# Patient Record
Sex: Male | Born: 1950 | Race: White | Hispanic: No | Smoking: Never smoker
Health system: Southern US, Community
[De-identification: ages and names within clinical notes are randomized; demographics above are authoritative.]

## PROBLEM LIST (undated history)

## (undated) DIAGNOSIS — I2510 Atherosclerotic heart disease of native coronary artery without angina pectoris: Secondary | ICD-10-CM

## (undated) DIAGNOSIS — E785 Hyperlipidemia, unspecified: Secondary | ICD-10-CM

## (undated) DIAGNOSIS — I700 Atherosclerosis of aorta: Secondary | ICD-10-CM

## (undated) DIAGNOSIS — H269 Unspecified cataract: Secondary | ICD-10-CM

## (undated) DIAGNOSIS — I10 Essential (primary) hypertension: Secondary | ICD-10-CM

## (undated) DIAGNOSIS — D126 Benign neoplasm of colon, unspecified: Secondary | ICD-10-CM

## (undated) HISTORY — DX: Benign neoplasm of colon, unspecified: D12.6

## (undated) HISTORY — DX: Atherosclerosis of aorta: I70.0

## (undated) HISTORY — DX: Essential (primary) hypertension: I10

## (undated) HISTORY — DX: Hemochromatosis, unspecified: E83.119

## (undated) HISTORY — DX: Atherosclerotic heart disease of native coronary artery without angina pectoris: I25.10

## (undated) HISTORY — PX: INNER EAR SURGERY: SHX679

## (undated) HISTORY — DX: Hyperlipidemia, unspecified: E78.5

## (undated) HISTORY — DX: Unspecified cataract: H26.9

---

## 1999-08-27 ENCOUNTER — Encounter (INDEPENDENT_AMBULATORY_CARE_PROVIDER_SITE_OTHER): Payer: Self-pay | Admitting: Specialist

## 1999-08-27 ENCOUNTER — Ambulatory Visit (HOSPITAL_COMMUNITY): Admission: RE | Admit: 1999-08-27 | Discharge: 1999-08-27 | Payer: Self-pay | Admitting: Gastroenterology

## 1999-08-27 ENCOUNTER — Encounter: Payer: Self-pay | Admitting: Gastroenterology

## 2000-04-13 ENCOUNTER — Inpatient Hospital Stay (HOSPITAL_COMMUNITY): Admission: AD | Admit: 2000-04-13 | Discharge: 2000-04-16 | Payer: Self-pay | Admitting: Cardiology

## 2000-04-13 ENCOUNTER — Encounter: Payer: Self-pay | Admitting: Emergency Medicine

## 2000-12-16 ENCOUNTER — Inpatient Hospital Stay (HOSPITAL_COMMUNITY): Admission: EM | Admit: 2000-12-16 | Discharge: 2000-12-18 | Payer: Self-pay | Admitting: Emergency Medicine

## 2000-12-16 ENCOUNTER — Encounter: Payer: Self-pay | Admitting: Emergency Medicine

## 2000-12-17 ENCOUNTER — Encounter: Payer: Self-pay | Admitting: *Deleted

## 2002-06-19 ENCOUNTER — Emergency Department (HOSPITAL_COMMUNITY): Admission: EM | Admit: 2002-06-19 | Discharge: 2002-06-19 | Payer: Self-pay | Admitting: Emergency Medicine

## 2002-08-20 ENCOUNTER — Inpatient Hospital Stay (HOSPITAL_COMMUNITY): Admission: EM | Admit: 2002-08-20 | Discharge: 2002-08-22 | Payer: Self-pay | Admitting: Cardiology

## 2003-11-13 ENCOUNTER — Emergency Department (HOSPITAL_COMMUNITY): Admission: EM | Admit: 2003-11-13 | Discharge: 2003-11-13 | Payer: Self-pay | Admitting: Emergency Medicine

## 2004-04-09 ENCOUNTER — Ambulatory Visit: Payer: Self-pay | Admitting: Cardiology

## 2004-04-19 ENCOUNTER — Ambulatory Visit: Payer: Self-pay | Admitting: Internal Medicine

## 2004-04-28 ENCOUNTER — Ambulatory Visit: Payer: Self-pay | Admitting: Internal Medicine

## 2004-06-30 ENCOUNTER — Ambulatory Visit: Payer: Self-pay | Admitting: Internal Medicine

## 2004-07-21 ENCOUNTER — Ambulatory Visit: Payer: Self-pay | Admitting: Gastroenterology

## 2004-07-21 ENCOUNTER — Ambulatory Visit: Payer: Self-pay | Admitting: Internal Medicine

## 2004-08-23 ENCOUNTER — Ambulatory Visit: Payer: Self-pay | Admitting: Internal Medicine

## 2004-08-25 ENCOUNTER — Ambulatory Visit: Payer: Self-pay | Admitting: Internal Medicine

## 2004-12-27 ENCOUNTER — Ambulatory Visit: Payer: Self-pay | Admitting: Gastroenterology

## 2005-03-30 ENCOUNTER — Ambulatory Visit: Payer: Self-pay | Admitting: Gastroenterology

## 2005-06-29 ENCOUNTER — Ambulatory Visit: Payer: Self-pay | Admitting: Gastroenterology

## 2005-08-08 ENCOUNTER — Ambulatory Visit: Payer: Self-pay | Admitting: Internal Medicine

## 2005-08-10 ENCOUNTER — Ambulatory Visit: Payer: Self-pay | Admitting: Internal Medicine

## 2005-08-19 ENCOUNTER — Ambulatory Visit: Payer: Self-pay

## 2005-09-21 ENCOUNTER — Ambulatory Visit: Payer: Self-pay | Admitting: Internal Medicine

## 2005-10-19 ENCOUNTER — Ambulatory Visit: Payer: Self-pay | Admitting: Internal Medicine

## 2005-10-26 ENCOUNTER — Ambulatory Visit: Payer: Self-pay | Admitting: Internal Medicine

## 2006-01-25 ENCOUNTER — Ambulatory Visit: Payer: Self-pay | Admitting: Gastroenterology

## 2006-04-25 ENCOUNTER — Ambulatory Visit: Payer: Self-pay | Admitting: Gastroenterology

## 2006-04-25 LAB — CONVERTED CEMR LAB
HCT: 44.4 % (ref 39.0–52.0)
Hemoglobin: 15.2 g/dL (ref 13.0–17.0)

## 2006-06-21 ENCOUNTER — Ambulatory Visit: Payer: Self-pay | Admitting: Internal Medicine

## 2006-07-31 ENCOUNTER — Ambulatory Visit: Payer: Self-pay | Admitting: Internal Medicine

## 2006-07-31 LAB — CONVERTED CEMR LAB
ALT: 36 units/L (ref 0–40)
AST: 26 units/L (ref 0–37)
BUN: 12 mg/dL (ref 6–23)
Cholesterol: 142 mg/dL (ref 0–200)
Creatinine, Ser: 0.9 mg/dL (ref 0.4–1.5)
Glucose, Bld: 64 mg/dL — ABNORMAL LOW (ref 70–99)
HCT: 38.8 % — ABNORMAL LOW (ref 39.0–52.0)
HDL: 46.7 mg/dL (ref >39.0)
Hemoglobin: 13.9 g/dL (ref 13.0–17.0)
Hgb A1c MFr Bld: 8 % — ABNORMAL HIGH (ref 4.6–6.0)
LDL Cholesterol: 80 mg/dL (ref 0–99)
PSA: 0.78 ng/mL (ref 0.10–4.00)
Total CHOL/HDL Ratio: 3
Triglycerides: 75 mg/dL (ref 0–149)
VLDL: 15 mg/dL (ref 0–40)

## 2006-09-11 ENCOUNTER — Ambulatory Visit: Payer: Self-pay | Admitting: Internal Medicine

## 2006-11-01 ENCOUNTER — Ambulatory Visit: Payer: Self-pay | Admitting: Gastroenterology

## 2006-11-01 LAB — CONVERTED CEMR LAB
HCT: 34.9 % — ABNORMAL LOW (ref 39.0–52.0)
Hemoglobin: 12 g/dL — ABNORMAL LOW (ref 13.0–17.0)

## 2007-02-21 ENCOUNTER — Ambulatory Visit: Payer: Self-pay | Admitting: Gastroenterology

## 2007-02-21 LAB — CONVERTED CEMR LAB
HCT: 40.4 % (ref 39.0–52.0)
Hemoglobin: 13.6 g/dL (ref 13.0–17.0)

## 2007-04-27 ENCOUNTER — Ambulatory Visit: Payer: Self-pay | Admitting: Gastroenterology

## 2007-07-27 ENCOUNTER — Ambulatory Visit: Payer: Self-pay | Admitting: Gastroenterology

## 2007-08-02 ENCOUNTER — Telehealth: Payer: Self-pay | Admitting: Internal Medicine

## 2007-08-07 ENCOUNTER — Telehealth: Payer: Self-pay | Admitting: Internal Medicine

## 2007-08-14 ENCOUNTER — Ambulatory Visit: Payer: Self-pay | Admitting: Cardiology

## 2007-08-15 ENCOUNTER — Ambulatory Visit: Payer: Self-pay

## 2007-08-15 ENCOUNTER — Encounter: Payer: Self-pay | Admitting: Internal Medicine

## 2007-08-15 LAB — CONVERTED CEMR LAB
ALT: 33 units/L (ref 0–53)
AST: 31 units/L (ref 0–37)
Alkaline Phosphatase: 172 units/L — ABNORMAL HIGH (ref 39–117)
BUN: 13 mg/dL (ref 6–23)
Basophils Relative: 0.9 % (ref 0.0–1.0)
Bilirubin, Direct: 0.2 mg/dL (ref 0.0–0.3)
CO2: 33 meq/L — ABNORMAL HIGH (ref 19–32)
Calcium: 9.3 mg/dL (ref 8.4–10.5)
Chloride: 106 meq/L (ref 96–112)
Eosinophils Absolute: 0.2 10*3/uL (ref 0.0–0.6)
Eosinophils Relative: 2.9 % (ref 0.0–5.0)
GFR calc Af Amer: 112 mL/min
GFR calc non Af Amer: 93 mL/min
Glucose, Bld: 59 mg/dL — ABNORMAL LOW (ref 70–99)
HDL: 56.6 mg/dL (ref >39.0)
Monocytes Relative: 11.6 % — ABNORMAL HIGH (ref 3.0–11.0)
Neutro Abs: 2.7 10*3/uL (ref 1.4–7.7)
Platelets: 294 10*3/uL (ref 150–400)
RBC: 4.99 M/uL (ref 4.22–5.81)
Saturation Ratios: 14.9 % — ABNORMAL LOW (ref 20.0–50.0)
Total CHOL/HDL Ratio: 2.5
Triglycerides: 44 mg/dL (ref 0–149)
VLDL: 9 mg/dL (ref 0–40)
WBC: 5.5 10*3/uL (ref 4.5–10.5)

## 2007-09-06 ENCOUNTER — Ambulatory Visit: Payer: Self-pay | Admitting: Cardiology

## 2007-10-25 ENCOUNTER — Ambulatory Visit: Payer: Self-pay | Admitting: Internal Medicine

## 2007-10-25 LAB — CONVERTED CEMR LAB: Hemoglobin: 13.4 g/dL (ref 13.0–17.0)

## 2007-10-29 ENCOUNTER — Encounter: Payer: Self-pay | Admitting: Internal Medicine

## 2007-10-31 ENCOUNTER — Ambulatory Visit: Payer: Self-pay | Admitting: Internal Medicine

## 2007-11-01 DIAGNOSIS — E1065 Type 1 diabetes mellitus with hyperglycemia: Secondary | ICD-10-CM

## 2007-11-01 DIAGNOSIS — H9190 Unspecified hearing loss, unspecified ear: Secondary | ICD-10-CM | POA: Insufficient documentation

## 2007-11-01 DIAGNOSIS — E10319 Type 1 diabetes mellitus with unspecified diabetic retinopathy without macular edema: Secondary | ICD-10-CM

## 2007-11-01 DIAGNOSIS — E109 Type 1 diabetes mellitus without complications: Secondary | ICD-10-CM | POA: Insufficient documentation

## 2007-11-01 DIAGNOSIS — I2510 Atherosclerotic heart disease of native coronary artery without angina pectoris: Secondary | ICD-10-CM | POA: Insufficient documentation

## 2007-11-01 DIAGNOSIS — I10 Essential (primary) hypertension: Secondary | ICD-10-CM | POA: Insufficient documentation

## 2007-11-01 DIAGNOSIS — E1051 Type 1 diabetes mellitus with diabetic peripheral angiopathy without gangrene: Secondary | ICD-10-CM | POA: Insufficient documentation

## 2007-11-01 DIAGNOSIS — I70209 Unspecified atherosclerosis of native arteries of extremities, unspecified extremity: Secondary | ICD-10-CM

## 2007-11-01 HISTORY — DX: Atherosclerotic heart disease of native coronary artery without angina pectoris: I25.10

## 2008-01-23 ENCOUNTER — Ambulatory Visit: Payer: Self-pay | Admitting: Gastroenterology

## 2008-01-23 LAB — CONVERTED CEMR LAB
HCT: 38 % — ABNORMAL LOW (ref 39.0–52.0)
Hemoglobin: 13.4 g/dL (ref 13.0–17.0)

## 2008-04-23 ENCOUNTER — Ambulatory Visit: Payer: Self-pay | Admitting: Gastroenterology

## 2008-04-25 LAB — CONVERTED CEMR LAB: Hemoglobin: 13.4 g/dL (ref 13.0–17.0)

## 2008-07-24 ENCOUNTER — Ambulatory Visit: Payer: Self-pay | Admitting: Gastroenterology

## 2008-07-31 ENCOUNTER — Encounter (INDEPENDENT_AMBULATORY_CARE_PROVIDER_SITE_OTHER): Payer: Self-pay

## 2008-08-20 ENCOUNTER — Telehealth: Payer: Self-pay | Admitting: Internal Medicine

## 2008-09-19 ENCOUNTER — Ambulatory Visit: Payer: Self-pay | Admitting: Internal Medicine

## 2008-10-21 ENCOUNTER — Ambulatory Visit: Payer: Self-pay | Admitting: Gastroenterology

## 2008-11-06 ENCOUNTER — Ambulatory Visit: Payer: Self-pay | Admitting: Internal Medicine

## 2009-01-21 ENCOUNTER — Ambulatory Visit: Payer: Self-pay | Admitting: Gastroenterology

## 2009-01-22 LAB — CONVERTED CEMR LAB
HCT: 37.3 % — ABNORMAL LOW (ref 39.0–52.0)
Hemoglobin: 12.4 g/dL — ABNORMAL LOW (ref 13.0–17.0)

## 2009-04-07 ENCOUNTER — Encounter (INDEPENDENT_AMBULATORY_CARE_PROVIDER_SITE_OTHER): Payer: Self-pay | Admitting: *Deleted

## 2009-04-22 ENCOUNTER — Telehealth: Payer: Self-pay | Admitting: Internal Medicine

## 2009-04-22 ENCOUNTER — Ambulatory Visit: Payer: Self-pay | Admitting: Gastroenterology

## 2009-04-23 ENCOUNTER — Ambulatory Visit: Payer: Self-pay | Admitting: Internal Medicine

## 2009-04-23 ENCOUNTER — Ambulatory Visit: Payer: Self-pay | Admitting: Cardiology

## 2009-04-23 LAB — CONVERTED CEMR LAB
Basophils Absolute: 0 10*3/uL (ref 0.0–0.1)
Basophils Relative: 0 % (ref 0.0–3.0)
Chloride: 102 meq/L (ref 96–112)
Creatinine, Ser: 1 mg/dL (ref 0.4–1.5)
Eosinophils Relative: 7.9 % — ABNORMAL HIGH (ref 0.0–5.0)
Ferritin: 8 ng/mL — ABNORMAL LOW (ref 22.0–322.0)
HCT: 36.5 % — ABNORMAL LOW (ref 39.0–52.0)
Hemoglobin: 11.9 g/dL — ABNORMAL LOW (ref 13.0–17.0)
Lymphocytes Relative: 31.5 % (ref 12.0–46.0)
Lymphs Abs: 2.1 10*3/uL (ref 0.7–4.0)
Monocytes Relative: 10.2 % (ref 3.0–12.0)
Neutro Abs: 3.3 10*3/uL (ref 1.4–7.7)
RBC: 4.44 M/uL (ref 4.22–5.81)
RDW: 16.3 % — ABNORMAL HIGH (ref 11.5–14.6)
Saturation Ratios: 7.8 % — ABNORMAL LOW (ref 20.0–50.0)
Sodium: 140 meq/L (ref 135–145)
WBC: 6.6 10*3/uL (ref 4.5–10.5)

## 2009-04-27 ENCOUNTER — Ambulatory Visit: Payer: Self-pay | Admitting: Internal Medicine

## 2009-04-27 LAB — CONVERTED CEMR LAB: Blood Glucose, Fingerstick: 47

## 2009-05-05 ENCOUNTER — Encounter: Payer: Self-pay | Admitting: Nurse Practitioner

## 2009-05-05 ENCOUNTER — Ambulatory Visit: Payer: Self-pay | Admitting: Cardiovascular Disease

## 2009-05-05 DIAGNOSIS — E785 Hyperlipidemia, unspecified: Secondary | ICD-10-CM | POA: Insufficient documentation

## 2009-05-06 ENCOUNTER — Telehealth: Payer: Self-pay | Admitting: Internal Medicine

## 2009-05-11 ENCOUNTER — Encounter: Payer: Self-pay | Admitting: Cardiology

## 2009-06-09 ENCOUNTER — Ambulatory Visit: Payer: Self-pay | Admitting: Cardiology

## 2009-07-02 ENCOUNTER — Ambulatory Visit: Payer: Self-pay | Admitting: Internal Medicine

## 2009-07-24 ENCOUNTER — Ambulatory Visit: Payer: Self-pay | Admitting: Gastroenterology

## 2009-07-24 LAB — CONVERTED CEMR LAB
HCT: 39.8 % (ref 39.0–52.0)
Hemoglobin: 12.7 g/dL — ABNORMAL LOW (ref 13.0–17.0)

## 2009-09-18 ENCOUNTER — Ambulatory Visit: Payer: Self-pay | Admitting: Internal Medicine

## 2009-09-18 ENCOUNTER — Telehealth: Payer: Self-pay | Admitting: Internal Medicine

## 2009-09-29 ENCOUNTER — Ambulatory Visit: Payer: Self-pay | Admitting: Internal Medicine

## 2009-09-29 LAB — CONVERTED CEMR LAB
ALT: 38 units/L (ref 0–53)
AST: 40 units/L — ABNORMAL HIGH (ref 0–37)
Albumin: 4.1 g/dL (ref 3.5–5.2)
Alkaline Phosphatase: 152 units/L — ABNORMAL HIGH (ref 39–117)
BUN: 10 mg/dL (ref 6–23)
CO2: 31 meq/L (ref 19–32)
Calcium: 9.3 mg/dL (ref 8.4–10.5)
Chloride: 103 meq/L (ref 96–112)
Creatinine, Ser: 1 mg/dL (ref 0.4–1.5)
Total Bilirubin: 0.8 mg/dL (ref 0.3–1.2)
Total CHOL/HDL Ratio: 3
Triglycerides: 91 mg/dL (ref 0.0–149.0)

## 2009-10-12 ENCOUNTER — Ambulatory Visit: Payer: Self-pay | Admitting: Internal Medicine

## 2009-10-12 DIAGNOSIS — R55 Syncope and collapse: Secondary | ICD-10-CM | POA: Insufficient documentation

## 2009-10-15 ENCOUNTER — Ambulatory Visit: Payer: Self-pay

## 2009-10-15 ENCOUNTER — Encounter: Payer: Self-pay | Admitting: Cardiology

## 2009-10-22 ENCOUNTER — Telehealth: Payer: Self-pay | Admitting: Gastroenterology

## 2009-10-22 ENCOUNTER — Telehealth: Payer: Self-pay | Admitting: Internal Medicine

## 2009-10-22 ENCOUNTER — Ambulatory Visit: Payer: Self-pay | Admitting: Gastroenterology

## 2009-10-26 LAB — CONVERTED CEMR LAB
Saturation Ratios: 10.5 % — ABNORMAL LOW (ref 20.0–50.0)
Transferrin: 257.8 mg/dL (ref 212.0–360.0)

## 2009-11-26 ENCOUNTER — Telehealth: Payer: Self-pay | Admitting: Internal Medicine

## 2009-12-01 ENCOUNTER — Telehealth: Payer: Self-pay | Admitting: Cardiology

## 2009-12-18 ENCOUNTER — Ambulatory Visit: Payer: Self-pay | Admitting: Gastroenterology

## 2009-12-21 LAB — CONVERTED CEMR LAB
Basophils Absolute: 0 10*3/uL (ref 0.0–0.1)
Folate: 11.9 ng/mL
Iron: 45 ug/dL (ref 42–165)
Lymphocytes Relative: 27.7 % (ref 12.0–46.0)
Lymphs Abs: 1.8 10*3/uL (ref 0.7–4.0)
Monocytes Relative: 10.3 % (ref 3.0–12.0)
Neutrophils Relative %: 55.9 % (ref 43.0–77.0)
Platelets: 247 10*3/uL (ref 150.0–400.0)
RDW: 20.6 % — ABNORMAL HIGH (ref 11.5–14.6)
Saturation Ratios: 15.8 % — ABNORMAL LOW (ref 20.0–50.0)
Transferrin: 203.8 mg/dL — ABNORMAL LOW (ref 212.0–360.0)
Vitamin B-12: 460 pg/mL (ref 211–911)

## 2009-12-24 ENCOUNTER — Ambulatory Visit: Payer: Self-pay | Admitting: Cardiology

## 2010-01-11 ENCOUNTER — Telehealth (INDEPENDENT_AMBULATORY_CARE_PROVIDER_SITE_OTHER): Payer: Self-pay | Admitting: *Deleted

## 2010-03-11 ENCOUNTER — Ambulatory Visit: Payer: Self-pay | Admitting: Internal Medicine

## 2010-06-11 ENCOUNTER — Ambulatory Visit
Admission: RE | Admit: 2010-06-11 | Discharge: 2010-06-11 | Payer: Self-pay | Source: Home / Self Care | Attending: Gastroenterology | Admitting: Gastroenterology

## 2010-06-11 ENCOUNTER — Other Ambulatory Visit: Payer: Self-pay | Admitting: Gastroenterology

## 2010-06-11 LAB — CBC WITH DIFFERENTIAL/PLATELET
Basophils Absolute: 0.1 10*3/uL (ref 0.0–0.1)
Basophils Relative: 0.8 % (ref 0.0–3.0)
Eosinophils Absolute: 0.3 10*3/uL (ref 0.0–0.7)
Eosinophils Relative: 4.1 % (ref 0.0–5.0)
HCT: 38.7 % — ABNORMAL LOW (ref 39.0–52.0)
Hemoglobin: 13.1 g/dL (ref 13.0–17.0)
Lymphocytes Relative: 32 % (ref 12.0–46.0)
Lymphs Abs: 2.3 10*3/uL (ref 0.7–4.0)
MCHC: 33.8 g/dL (ref 30.0–36.0)
MCV: 84.8 fl (ref 78.0–100.0)
Monocytes Absolute: 0.7 10*3/uL (ref 0.1–1.0)
Monocytes Relative: 9.2 % (ref 3.0–12.0)
Neutro Abs: 3.9 10*3/uL (ref 1.4–7.7)
Neutrophils Relative %: 53.9 % (ref 43.0–77.0)
Platelets: 231 10*3/uL (ref 150.0–400.0)
RBC: 4.56 Mil/uL (ref 4.22–5.81)
RDW: 16 % — ABNORMAL HIGH (ref 11.5–14.6)
WBC: 7.3 10*3/uL (ref 4.5–10.5)

## 2010-06-11 LAB — FERRITIN: Ferritin: 6.7 ng/mL — ABNORMAL LOW (ref 22.0–322.0)

## 2010-07-06 NOTE — Assessment & Plan Note (Signed)
Summary: PASSED OUT SAT/ EMT'S CAME/ REFUSED TO GO TO ER FOR EMT'S/NWS   Vital Signs:  Patient profile:   60 year old male Height:      74 inches Weight:      179 pounds BMI:     23.07 O2 Sat:      97 % on Room air Temp:     97.3 degrees F oral Pulse rate:   60 / minute BP sitting:   162 / 84  (left arm) Cuff size:   regular  Vitals Entered By: Bill Salinas CMA (Oct 12, 2009 9:14 AM)  O2 Flow:  Room air CC: pt here after having fainting spell on sat./ pt states his BP has been going up and down and does have a record of his readings/ pt states he was given [place]   Primary Care Provider:  [facility]  CC:  pt here after having fainting spell on sat./ pt states his BP has been going up and down and does have a record of his readings/ pt states he was given [place].  History of Present Illness: Patient reports an episode of syncope two days ago.  After a busy day he was at home with his son.  He got up from the sofa, walked 76ft and suddenly passed out.  He denies any abnormal sensations before passing out or after waking up on the ground.  His son witnessed the event and reported that he was out for only a few seconds and had no jerking movements.  The patient was not injured in this fall but expresses concerns that he might hit his head or injure himself in the future.  He denies hypoglycemia  on the day of the episode.  EMS was called but the patient refused to be transported to the hospital.  Per patient report, vital signs and EKG done by the EMS personnel were normal.  He has had episodes like this in the past.  Approximately one month ago he had three syncopal episodes over the span of a week.  He denies any consistent precipitating events.  He also reports separate incidents of racing heart rate.  Current Medications (verified): 1)  Enalapril Maleate 20 Mg  Tabs (Enalapril Maleate) .... Take 1 Tablet By Mouth Once A Day 2)  Simvastatin 20 Mg  Tabs (Simvastatin) .Marland Kitchen.. 1 Once Daily 3)   Levoxyl 75 Mcg  Tabs (Levothyroxine Sodium) .Marland Kitchen.. 1 Once Daily 4)  Humulin 70/30 70-30 %  Susp (Insulin Isophane & Regular) .... Use 32 Units Daily  As Directed 5)  Lantus 100 Unit/ml  Soln (Insulin Glargine) .... Use 12 Units Daily At Bedtime 6)  Bd Disp Needles 30g X 1/2"  Misc (Needle (Disp)) .... Use As Directed 7)  Aspirin 81 Mg  Tabs (Aspirin) .... Take 1 Tablet By Mouth Two Times A Day 8)  Free Style Lite Test Strips .... Cbgs Three Times A Day and Prn 9)  Cartia Xt 300 Mg Xr24h-Cap (Diltiazem Hcl Coated Beads) .Marland Kitchen.. 1 By Mouth Once Daily 10)  Metoprolol Succinate 50 Mg Xr24h-Tab (Metoprolol Succinate) .... Take 1 1/2 Tablet By Mouth Daily 11)  Clonidine Hcl 0.1 Mg Tabs (Clonidine Hcl) .Marland Kitchen.. 1 Tab If Bp Is 180 or Over  Allergies (verified): No Known Drug Allergies  Past History:  Past Medical History: Last updated: 05/05/2009 UCD Diabetes mellitus, type I Hypertension, Dx Fall 2010 Diabetic retinopathy Hearing loss CAD      - s/p MI Nov '01 PTCA  LAD      -  last cath March '04 60% LAD, 1st Dx 70% ostial, 2nd Dx 70% ostial            dominant RCA w/ 30% multiple lesions, Cx 30% ostial      - Myoview March '09 -  no ischemia Hearing loss Hemachromatosis Hyperlipidemia  Past Surgical History: Last updated: 10/31/2007 right ear surgery  Family History: Last updated: 09/19/2008 father - 1923: IDDM since '53, CVA - has a mild hemiplegia, EtOH abuse, prostate cancer. 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  The patient denies anorexia, fever,  weight loss, weight gain, vision loss, chest pain, dyspnea on exertion, peripheral edema, headaches, and muscle weakness.    Physical Exam  General:  alert and well-developed.   Head:  normocephalic and atraumatic.   Eyes:  vision grossly intact, pupils equal, pupils round, pupils reactive to light, and pupils react to accomodation.   Ears:  R ear normal and L ear normal.   Neck:  supple, no masses, no thyromegaly, and no thyroid nodules or tenderness.   Lungs:  normal respiratory effort, no accessory muscle use, normal breath sounds, no crackles, and no wheezes.   Heart:  normal rate, regular rhythm, no gallop, no rub, and Grade  1 /6 systolic ejection murmur.   Pulses:  no carotid bruits. Psych:  Oriented X3, memory intact for recent and remote, and normally interactive.   Additional Exam:  Orthostatics: Lying 160/80 Sitting 159/76 Standing 152/71   Impression & Recommendations:  Problem # 1:  SYNCOPE (ICD-780.2)  The patient reports several episodes of syncope.  The description of the episodes is consistent with a tachyarrhythmia such as PSVT.  The patient is already on beta-blocker and calcium channel blocker therapy.   Plan- Will refer patient for an event monitor to hopefully capture a reading during an event.    Orders: Cardiology Referral (Cardiology)  Problem # 2:  HYPERTENSION (ICD-401.9) Patient's blood pressure is still not at goal.  Home readings range 145-160/80-95.  Will do a trial of increased dose of Valturna.  Patient is to check blood pressure at home and call after a week with readings.   His updated medication list for this problem includes:    Valturna 300-320 Mg Tabs (Aliskiren-valsartan) .Marland Kitchen... 1 by mouth once daily    Cartia Xt 300 Mg Xr24h-cap (Diltiazem hcl coated beads) .Marland Kitchen... 1 by mouth once daily    Metoprolol Succinate 50 Mg Xr24h-tab (Metoprolol succinate) .Marland Kitchen... Take 1 1/2 tablet by mouth daily    Clonidine Hcl 0.1 Mg Tabs (Clonidine hcl) .Marland Kitchen... 1 tab  if bp is 180 or over  Problem # 3:  DIABETES MELLITUS, TYPE I (ICD-250.01) Patient is on a stable insulin plan.     His updated medication list for this problem includes:    Humulin 70/30 70-30 % Susp (Insulin isophane & regular) ..... Use 32 units daily  as directed    Lantus 100 Unit/ml Soln (Insulin glargine) ..... Use 12 units daily at bedtime    Aspirin 81 Mg Tabs (Aspirin) .Marland Kitchen... Take 1 tablet by mouth two times a  day  Complete Medication List: 1)  Valturna 300-320 Mg Tabs (Aliskiren-valsartan) .Marland Kitchen.. 1 by mouth once daily 2)  Simvastatin 20 Mg Tabs (Simvastatin) .Marland Kitchen.. 1 once daily 3)  Levoxyl 75 Mcg Tabs (Levothyroxine sodium) .Marland Kitchen.. 1 once daily 4)  Humulin 70/30 70-30 % Susp (Insulin isophane & regular) .... Use 32 units daily  as directed 5)  Lantus 100 Unit/ml Soln (Insulin glargine) .... Use 12 units daily at bedtime 6)  Bd Disp Needles 30g X 1/2" Misc (Needle (disp)) .... Use as directed 7)  Aspirin 81 Mg Tabs (Aspirin) .... Take 1 tablet by mouth two times a day 8)  Free Style Lite Test Strips  .... Cbgs three times a day and prn 9)  Cartia Xt 300 Mg Xr24h-cap (Diltiazem hcl coated beads) .Marland Kitchen.. 1 by mouth once daily 10)  Metoprolol Succinate 50 Mg Xr24h-tab (Metoprolol succinate) .... Take 1 1/2 tablet by mouth daily 11)  Clonidine Hcl 0.1 Mg Tabs (Clonidine hcl) .Marland Kitchen.. 1 tab if bp is 180 or over Prescriptions: HUMULIN 70/30 70-30 %  SUSP (INSULIN ISOPHANE & REGULAR) use 32 units daily  as directed  #9 bottles x 3   Entered and Authorized by:   Jacques Navy MD   Signed by:   Jacques Navy MD on 10/12/2009   Method used:   Electronically to        [facility]* (mail-order)             ,          Ph: [phone]       Fax: [phone]   RxID:   [id number]

## 2010-07-06 NOTE — Progress Notes (Signed)
Summary: Monitor- f/u on symptoms   Phone Note Outgoing Call   Call placed by: Sherri Rad, RN, BSN,  November 26, 2009 2:46 PM Call placed to: Patient Summary of Call: I left a message for the pt to call regarding his event monitor. I reviewed the readings with Dr. Bonney Aid. Pt is having sinus brady and some junctional rhythms. Dr. Johney Frame asked that I call the pt to f/u on his symptoms. Initial call taken by: Sherri Rad, RN, BSN,  November 26, 2009 2:47 PM  Follow-up for Phone Call        pt reutrned call to heather pls call [phone] Follow-up by: Glynda Jaeger,  November 26, 2009 4:28 PM  Additional Follow-up for Phone Call Additional follow up Details #1::        I called the pt to f/u on his symptoms. He states that he does occasionally pass out, but he feels this is related to his bp meds. He states on days he does not take his meds/ he leaves a med off, that he feels ok. He states he felt bad yesterday afternoon and his bp was 119/60. The last time he passed out was last wednsday. He states he only passes out after he changes position from sitting to standing. I have verified his cardiac medications. I will review with the DOD and call the pt back. He is agreeable.  Additional Follow-up by: Sherri Rad, RN, BSN,  November 27, 2009 9:26 AM    Additional Follow-up for Phone Call Additional follow up Details #2::    I reviewed the pt's symptoms, medications, and rythym strips with Dr. Tenny Craw. Per Dr. Tenny Craw- orders received to have the pt d/c diltiazem and start amlodipine 10mg  once daily. I have explained this to the pt. He verbalizes understanding. I will review the above with Dr. Juanda Chance when he returns to the office next week to see if he wants to see the pt back in the office. I will call the pt back next week to verify this with him. He is agreeable. [facility] [address].  Follow-up by: Sherri Rad, RN, BSN,  November 27, 2009 10:42 AM  New/Updated Medications: AMLODIPINE  BESYLATE 10 MG TABS (AMLODIPINE BESYLATE) Take one tablet by mouth daily CLONIDINE HCL 0.1 MG TABS (CLONIDINE HCL) 1 tab as needed  if BP is 180 or over Prescriptions: AMLODIPINE BESYLATE 10 MG TABS (AMLODIPINE BESYLATE) Take one tablet by mouth daily  #30 x 6   Entered by:   Sherri Rad, RN, BSN   Authorized by:   Lenoria Farrier, MD, [facility]   Signed by:   Sherri Rad, RN, BSN on 11/27/2009   Method used:   [facility]        [facility] [phone]* (retail)       [address]       [place], [place]  [address]       Ph: [phone] or [phone]       Fax: [phone]   RxID:   [id number]

## 2010-07-06 NOTE — Assessment & Plan Note (Signed)
Summary: BP ISSUES FOR 6 MO/ NEEDS RX'S RE-WRITTEN DUE TO INSU CHANGE/NWS   Vital Signs:  Patient profile:   60 year old male Height:      74 inches Weight:      178 pounds BMI:     22.94 O2 Sat:      97 % on Room air Temp:     96.7 degrees F oral BP sitting:   158 / 70  (left arm) Cuff size:   regular  Vitals Entered By: Bill Salinas CMA (March 11, 2010 2:21 PM)  O2 Flow:  Room air CC: ov for evaluation of continued high BP/ ab   Primary Care Sinia Antosh:  [facility]  CC:  ov for evaluation of continued high BP/ ab.  History of Present Illness: Logan [name] is a 60 y.o. caucasian male who came to the clinic today because he recently switched insurance companies and needs new perscriptions for all of his medication. His new insurance company, [employer], is intended for high risk patients (Logan [name] has a PMH positive for Type I diabetes, Acute MI, and HTN). He receives his medication in the mail. He has recently run out of Amlodipine medication and needs that filled ASAP becuase he cannot wait for it to arrive via mail.  Current Medications (verified): 1)  Valturna 300-320 Mg Tabs (Aliskiren-Valsartan) .Marland Kitchen.. 1 By Mouth Once Daily 2)  Simvastatin 20 Mg  Tabs (Simvastatin) .Marland Kitchen.. 1 Once Daily 3)  Levoxyl 75 Mcg  Tabs (Levothyroxine Sodium) .Marland Kitchen.. 1 Once Daily 4)  Humulin 70/30 70-30 %  Susp (Insulin Isophane & Regular) .... Use 32 Units Daily  As Directed 5)  Lantus 100 Unit/ml  Soln (Insulin Glargine) .... Use 12 Units Daily At Bedtime 6)  Bd Disp Needles 30g X 1/2"  Misc (Needle (Disp)) .... Use As Directed 7)  Aspirin 81 Mg  Tabs (Aspirin) .... Take 1 Tablet By Mouth Two Times A Day 8)  Free Style Lite Test Strips .... Cbgs Three Times A Day and Prn 9)  Amlodipine Besylate 10 Mg Tabs (Amlodipine Besylate) .... Take One Tablet By Mouth Daily 10)  Metoprolol Succinate 50 Mg Xr24h-Tab (Metoprolol Succinate) .... Take 1 1/2 Tablet By Mouth Daily 11)  Clonidine Hcl 0.1 Mg Tabs (Clonidine  Hcl) .Marland Kitchen.. 1 Tab As Needed  If Bp Is 180 or Over  Allergies (verified): No Known Drug Allergies  Past History:  Past Medical History: Last updated: 05/05/2009 UCD Diabetes mellitus, type I Hypertension, Dx Fall 2010 Diabetic retinopathy Hearing loss CAD      - s/p MI Nov '01 PTCA  LAD      - last cath March '04 60% LAD, 1st Dx 70% ostial, 2nd Dx 70% ostial            dominant RCA w/ 30% multiple lesions, Cx 30% ostial      - Myoview March '09 -  no ischemia Hearing loss Hemachromatosis Hyperlipidemia  Past Surgical History: Last updated: 10/31/2007 right ear surgery  Family History: Last updated: 09/19/2008 father - 1923: IDDM since '53, CVA - has a mild hemiplegia, EtOH abuse, prostate cancer. mother - deceased @37 : asthma, emphysema, heart failure Neg - colon cancer; CAD   Impression & Recommendations:  Problem # 1:  Preventive Health Care (ICD-V70.0) Patient reports he is doing well. All his meds are refilled. He will follow up for routine care and management. He will follow-up with cardiology as instructed.  Complete Medication List: 1)  Valturna 300-320 Mg Tabs (Aliskiren-valsartan) .Marland KitchenMarland KitchenMarland Kitchen  1 by mouth once daily 2)  Simvastatin 20 Mg Tabs (Simvastatin) .Marland Kitchen.. 1 once daily 3)  Levoxyl 75 Mcg Tabs (Levothyroxine sodium) .Marland Kitchen.. 1 once daily 4)  Humulin 70/30 70-30 % Susp (Insulin isophane & regular) .... Use 32 units daily  as directed 5)  Lantus 100 Unit/ml Soln (Insulin glargine) .... Use 12 units daily at bedtime 6)  Bd Disp Needles 30g X 1/2" Misc (Needle (disp)) .... Use as directed 7)  Aspirin 81 Mg Tabs (Aspirin) .... Take 1 tablet by mouth two times a day 8)  Free Style Lite Test Strips  .... Cbgs three times a day and prn 9)  Amlodipine Besylate 10 Mg Tabs (Amlodipine besylate) .... Take one tablet by mouth daily 10)  Metoprolol Succinate 50 Mg Xr24h-tab (Metoprolol succinate) .... Take 1 1/2 tablet by mouth daily 11)  Clonidine Hcl 0.1 Mg Tabs (Clonidine hcl)  .Marland Kitchen.. 1 tab as needed  if bp is 180 or over  Other Orders: Admin 1st Vaccine ([address]) Flu Vaccine 73yrs + ([address]) Prescriptions: AMLODIPINE BESYLATE 10 MG TABS (AMLODIPINE BESYLATE) Take one tablet by mouth daily  #30 x 0   Entered and Authorized by:   Logan Navy MD   Signed by:   Logan Navy MD on 03/11/2010   Method used:   Print then Give to Patient   RxID:   [id number] CLONIDINE HCL 0.1 MG TABS (CLONIDINE HCL) 1 tab as needed  if BP is 180 or over  #90 x 3   Entered and Authorized by:   Logan Navy MD   Signed by:   Logan Navy MD on 03/11/2010   Method used:   Print then Give to Patient   RxID:   [id number] METOPROLOL SUCCINATE 50 MG XR24H-TAB (METOPROLOL SUCCINATE) Take 1 1/2 tablet by mouth daily  #135 x 3   Entered and Authorized by:   Logan Navy MD   Signed by:   Logan Navy MD on 03/11/2010   Method used:   Print then Give to Patient   RxID:   [id number] AMLODIPINE BESYLATE 10 MG TABS (AMLODIPINE BESYLATE) Take one tablet by mouth daily  #90 x 3   Entered and Authorized by:   Logan Navy MD   Signed by:   Logan Navy MD on 03/11/2010   Method used:   Print then Give to Patient   RxID:   [id number] BD DISP NEEDLES 30G X 1/2"  MISC (NEEDLE (DISP)) use as directed  #300 x 2   Entered and Authorized by:   Logan Navy MD   Signed by:   Logan Navy MD on 03/11/2010   Method used:   Print then Give to Patient   RxID:   [id number] HUMULIN 70/30 70-30 %  SUSP (INSULIN ISOPHANE & REGULAR) use 32 units daily  as directed  #9 bottles x 3   Entered and Authorized by:   Logan Navy MD   Signed by:   Logan Navy MD on 03/11/2010   Method used:   Print then Give to Patient   RxID:   [id number] LEVOXYL 75 MCG  TABS (LEVOTHYROXINE SODIUM) 1 once daily  #90 x 3   Entered and Authorized by:   Logan Navy MD   Signed by:   Logan Navy MD on 03/11/2010   Method used:   Print then Give  to Patient   RxID:   [id number] SIMVASTATIN 20 MG  TABS (SIMVASTATIN) 1  once daily  #90 x 3   Entered and Authorized by:   Logan Navy MD   Signed by:   Logan Navy MD on 03/11/2010   Method used:   Print then Give to Patient   RxID:   [id number] VALTURNA 300-320 MG TABS (ALISKIREN-VALSARTAN) 1 by mouth once daily  #90 x 3   Entered and Authorized by:   Logan Navy MD   Signed by:   Logan Navy MD on 03/11/2010   Method used:   Print then Give to Patient   RxID:   [id number] AMLODIPINE BESYLATE 10 MG TABS (AMLODIPINE BESYLATE) Take one tablet by mouth daily  #90 x 3   Entered and Authorized by:   Logan Navy MD   Signed by:   Logan Navy MD on 03/11/2010   Method used:   Print then Give to Patient   RxID:   [id number] AMLODIPINE BESYLATE 10 MG TABS (AMLODIPINE BESYLATE) Take one tablet by mouth daily  #30 x 1   Entered and Authorized by:   Logan Navy MD   Signed by:   Logan Navy MD on 03/11/2010   Method used:   Electronically to        [id number]* (retail)       [address] [place], [place]  [address]       Ph: [phone]       Fax: [phone]   RxID:   [id number] CLONIDINE HCL 0.1 MG TABS (CLONIDINE HCL) 1 tab as needed  if BP is 180 or over  #90 x 3   Entered and Authorized by:   Logan Navy MD   Signed by:   Logan Navy MD on 03/11/2010   Method used:   Electronically to        [facility] #[id number]* (retail)       [address] [place], [place]  [address]       Ph: [phone]       Fax: [phone]   RxID:   [id number] METOPROLOL SUCCINATE 50 MG XR24H-TAB (METOPROLOL SUCCINATE) Take 1 1/2 tablet by mouth daily  #135 x 3   Entered and Authorized by:   Logan Navy MD   Signed by:   Logan Navy MD on 03/11/2010   Method used:   Electronically to        [facility] #[id number]* (retail)       [address] [place], [place]  [address]       Ph: [phone]       Fax: [phone]   RxID:   [id number] FREE STYLE LITE TEST STRIPS CBGs three times a day and prn  #270 x 3   Entered and Authorized by:   Logan Navy MD   Signed by:   Logan Navy MD on 03/11/2010   Method used:   Print then Give to Patient   RxID:   [id number] LANTUS 100 UNIT/ML  SOLN (INSULIN GLARGINE) use 12 units daily at bedtime  #0 x 0   Entered and Authorized by:   Logan Navy MD   Signed by:   Logan Navy MD on 03/11/2010   Method used:   Print then Give to Patient   RxID:   [id number] HUMULIN 70/30 70-30 %  SUSP (  INSULIN ISOPHANE & REGULAR) use 32 units daily  as directed  #9 bottles x 3   Entered and Authorized by:   Logan Navy MD   Signed by:   Logan Navy MD on 03/11/2010   Method used:   Electronically to        [facility] #[id number]* (retail)       [address] [place], [place]  [address]       Ph: [phone]       Fax: [phone]   RxID:   [id number] LEVOXYL 75 MCG  TABS (LEVOTHYROXINE SODIUM) 1 once daily  #90 x 3   Entered and Authorized by:   Logan Navy MD   Signed by:   Logan Navy MD on 03/11/2010   Method used:   Electronically to        [facility] #[id number]* (retail)       [address] [place], [place]  [address]       Ph: [phone]       Fax: [phone]   RxID:   [id number] SIMVASTATIN 20 MG  TABS (SIMVASTATIN) 1 once daily  #90 x 3   Entered and Authorized by:   Logan Navy MD   Signed by:   Logan Navy MD on 03/11/2010   Method used:   Electronically to        [facility][id number]* (retail)       [address] [place], [place]  [address]       Ph: [phone]       Fax: [phone]   RxID:   [id number] VALTURNA 300-320 MG TABS (ALISKIREN-VALSARTAN) 1 by mouth once daily  #90 x 3   Entered and Authorized by:   Logan Navy MD   Signed by:   Logan Navy MD on  03/11/2010   Method used:   Electronically to        [facility] #[id number]* (retail)       [address] [place], [place]  [address]       Ph: [phone]       Fax: [phone]   RxID:   [id number]      Flu Vaccine Consent Questions     Do you have a history of severe allergic reactions to this vaccine? no    Any prior history of allergic reactions to egg and/or gelatin? no    Do you have a sensitivity to the preservative Thimersol? no    Do you have a past history of Guillan-Barre Syndrome? no    Do you currently have an acute febrile illness? no    Have you ever had a severe reaction to latex? no    Vaccine information given and explained to patient? yes    Are you currently pregnant? no    Lot Number:[id number]   Exp Date:12/04/2010   Site Given  Left Deltoid IMflu

## 2010-07-06 NOTE — Procedures (Signed)
Summary: Summary Report  Summary Report   Imported By: Erle Crocker 12/31/2009 07:43:04  _____________________________________________________________________  External Attachment:    Type:   Image     Comment:   External Document

## 2010-07-06 NOTE — Assessment & Plan Note (Signed)
Summary: rov   Visit Type:  Follow-up Primary Provider:  Norins  CC:  pt here to review monitor results and discuss bp readings.  History of Present Illness: The patient is 60 years old and return for followup management of CAD. 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 He had nonobstructive disease at catheterization in 2004. He had a negative Myoview scan in 2009 with an ejection fraction of 49%.  he recently was seen by Dr. Army Melia with syncopal episodes. 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 His diltiazem was discontinued after the monitor and he has had no recurrent symptoms.  He's had no recent chest pain.  Current Medications (verified): 1)  Valturna 300-320 Mg Tabs (Aliskiren-Valsartan) .Marland Kitchen.. 1 By Mouth Once Daily 2)  Simvastatin 20 Mg  Tabs (Simvastatin) .Marland Kitchen.. 1 Once Daily 3)  Levoxyl 75 Mcg  Tabs (Levothyroxine Sodium) .Marland Kitchen.. 1 Once Daily 4)  Humulin 70/30 70-30 %  Susp (Insulin Isophane & Regular) .... Use 32 Units Daily  As Directed 5)  Lantus 100 Unit/ml  Soln (Insulin Glargine) .... Use 12 Units Daily At Bedtime 6)  Bd Disp Needles 30g X 1/2"  Misc (Needle (Disp)) .... Use As Directed 7)  Aspirin 81 Mg  Tabs (Aspirin) .... Take 1 Tablet By Mouth Two Times A Day 8)  Free Style Lite Test Strips .... Cbgs Three Times A Day and Prn 9)  Amlodipine Besylate 10 Mg Tabs (Amlodipine Besylate) .... Take One Tablet By Mouth Daily 10)  Metoprolol Succinate 50 Mg Xr24h-Tab (Metoprolol Succinate) .... Take 1 1/2 Tablet By Mouth Daily 11)  Clonidine Hcl 0.1 Mg Tabs (Clonidine Hcl) .Marland Kitchen.. 1 Tab As Needed  If Bp Is 180 or Over  Allergies (verified): No Known Drug Allergies  Past History:  Past Medical History: Reviewed history from 05/05/2009 and no changes required. UCD Diabetes mellitus, type I Hypertension, Dx Fall 2010 Diabetic retinopathy Hearing loss CAD      - s/p MI Nov '01 PTCA  LAD      - last cath  March '04 60% LAD, 1st Dx 70% ostial, 2nd Dx 70% ostial            dominant RCA w/ 30% multiple lesions, Cx 30% ostial      - Myoview March '09 -  no ischemia Hearing loss Hemachromatosis Hyperlipidemia  Review of Systems       ROS is negative except as outlined in HPI.   Vital Signs:  Patient profile:   60 year old male Height:      74 inches Weight:      179 pounds BMI:     23.07 Pulse rate:   62 / minute BP sitting:   139 / 81  (left arm) Cuff size:   regular  Vitals Entered By: Burnett Kanaris, CNA (December 24, 2009 2:07 PM)  Physical Exam  Additional Exam:  Gen. Well-nourished, in no distress   Neck: No JVD, thyroid not enlarged, no carotid bruits Lungs: No tachypnea, clear without rales, rhonchi or wheezes Cardiovascular: Rhythm regular, PMI not displaced,  heart sounds  normal, no murmurs or gallops, no peripheral edema, pulses normal in all 4 extremities. Abdomen: BS normal, abdomen soft and non-tender without masses or organomegaly, no hepatosplenomegaly. MS: No deformities, no cyanosis or clubbing   Neuro:  No focal sns   Skin:  no lesions    Impression & Recommendations:  Problem # 1:  SYNCOPE (ICD-780.2) He had recent syncopal episodes and documented junctional bradycardia on event monitor. He is symptomatically better since diltiazem was discontinued. His resting pulse rate is 62 day period I think we can follow him on his current therapy. If he develops any recurrent symptoms we should cut back on his beta blocker and add an alternative antihypertensive medication if needed. He ordered Dr. Filbert Schilder are to let us know if he has recurrent problems. Otherwise we'll arrange followup with Dr. Shirlee Latch in January. His updated medication list for this problem includes:    Aspirin 81 Mg Tabs (Aspirin) .Marland Kitchen... Take 1 tablet by mouth two times a day    Amlodipine Besylate 10 Mg Tabs (Amlodipine besylate) .Marland Kitchen... Take one tablet by mouth daily    Metoprolol Succinate 50 Mg Xr24h-tab  (Metoprolol succinate) .Marland Kitchen... Take 1 1/2 tablet by mouth daily  Problem # 2:  HYPERTENSION (ICD-401.9) He brought in his blood pressure readings. Most of them are in the 135-155 range systolic. Considering his overall situation I think we'll continue his current therapy. His updated medication list for this problem includes:    Valturna 300-320 Mg Tabs (Aliskiren-valsartan) .Marland Kitchen... 1 by mouth once daily    Aspirin 81 Mg Tabs (Aspirin) .Marland Kitchen... Take 1 tablet by mouth two times a day    Amlodipine Besylate 10 Mg Tabs (Amlodipine besylate) .Marland Kitchen... Take one tablet by mouth daily    Metoprolol Succinate 50 Mg Xr24h-tab (Metoprolol succinate) .Marland Kitchen... Take 1 1/2 tablet by mouth daily    Clonidine Hcl 0.1 Mg Tabs (Clonidine hcl) .Marland Kitchen... 1 tab as needed  if bp is 180 or over  Problem # 3:  CAD (ICD-414.00) He has had remote PCI procedures. He's had no recent chest pain. This problem appears stable. His updated medication list for this problem includes:    Aspirin 81 Mg Tabs (Aspirin) .Marland Kitchen... Take 1 tablet by mouth two times a day    Amlodipine Besylate 10 Mg Tabs (Amlodipine besylate) .Marland Kitchen... Take one tablet by mouth daily    Metoprolol Succinate 50 Mg Xr24h-tab (Metoprolol succinate) .Marland Kitchen... Take 1 1/2 tablet by mouth daily  Patient Instructions: 1)  Your physician recommends that you continue on your current medications as directed. Please refer to the Current Medication list given to you today. 2)  Your physician wants you to follow-up in:  January 2012 with Dr. Shirlee Latch as previously scheduled.  You will receive a reminder letter in the mail two months in advance. If you don't receive a letter, please call our office to schedule the follow-up appointment.

## 2010-07-06 NOTE — Progress Notes (Signed)
Summary: Phleobotomy  Phone Note From Other Clinic   Summary of Call: Sherri from the lab calling.  Pt is there for phleobotomy.  HGB is 11.8.  Asking if phleobotomy should be done today? Per Dr. Marina Goodell,  Hold off phleobotomy  today and check with Dr. Russella Dar re recheck.  Sherri notified.  Labs will be sent to Dr. Russella Dar. Initial call taken by: Ashok Cordia RN,  Oct 22, 2009 1:14 PM  Follow-up for Phone Call        Repeat CBC in 2 months. Fe, TIBC, ferritin, B12, folate with blood drawn in 2 months. Do not schedule any more phlebotomies until his Hgb has increased. Follow-up by: Meryl Dare MD FACG,  Oct 22, 2009 2:47 PM  Additional Follow-up for Phone Call Additional follow up Details #1::        LM for pt to call.  Lupita Leash Surface RN  Oct 22, 2009 4:06 PM   Pt notified.  Appt sch for repeat lab work. Additional Follow-up by: Ashok Cordia RN,  Oct 23, 2009 1:57 PM

## 2010-07-06 NOTE — Assessment & Plan Note (Signed)
Summary: follow up for blood pressure/lb   Vital Signs:  Patient profile:   60 year old male Height:      74 inches (187.96 cm) Weight:      174.50 pounds (79.32 kg) BMI:     22.49 O2 Sat:      98 % on Room air Temp:     97.9 degrees F (36.61 degrees C) oral Pulse rate:   63 / minute Pulse rhythm:   regular BP sitting:   142 / 74  (left arm) Cuff size:   regular  Vitals Entered By: Brenton Grills (September 29, 2009 4:00 PM)  O2 Flow:  Room air CC: follow-up visit for BP/aj   Primary Care Provider:  Barb Shear  CC:  follow-up visit for BP/aj.  History of Present Illness: Patient presents for routine follow-up. He is planning a trip to [place] and then do some climbing/hiking [place], [place], and [place].   Blood pressure - suboptimal control on cartia XT 300, enalapril 20mg , metoprolol XR 50.   Arm and shoulder soreness has resolved: he was able to throw a ball without problems. He has given up gatoraide for a different power drink and he has started eating eggs. He is better.  due for routine lab follow-up for diabetes. He reports that his CBGs have been good.  GU- He does c/o incomplete tumescence and ability to maintain an erection. He also has minor urinary incontinence of a leakage type, during the day. No c/o urgency or frequency.      Current Medications (verified): 1)  Enalapril Maleate 20 Mg  Tabs (Enalapril Maleate) .... Take 1 Tablet By Mouth Once A Day 2)  Simvastatin 20 Mg  Tabs (Simvastatin) .Marland Kitchen.. 1 Once Daily 3)  Levoxyl 75 Mcg  Tabs (Levothyroxine Sodium) .Marland Kitchen.. 1 Once Daily 4)  Humulin 70/30 70-30 %  Susp (Insulin Isophane & Regular) .... Use 32 Units Daily  As Directed 5)  Lantus 100 Unit/ml  Soln (Insulin Glargine) .... Use 12 Units Daily At Bedtime 6)  Bd Disp Needles 30g X 1/2"  Misc (Needle (Disp)) .... Use As Directed 7)  Aspirin 81 Mg  Tabs (Aspirin) .... Take 1 Tablet By Mouth Two Times A Day 8)  Free Style Lite Test Strips .... Cbgs Three  Times A Day and Prn 9)  Cartia Xt 300 Mg Xr24h-Cap (Diltiazem Hcl Coated Beads) .Marland Kitchen.. 1 By Mouth Once Daily 10)  Metoprolol Succinate 50 Mg Xr24h-Tab (Metoprolol Succinate) .... Take 1 1/2 Tablet By Mouth Daily 11)  Clonidine Hcl 0.1 Mg Tabs (Clonidine Hcl) .Marland Kitchen.. 1 Tab If Bp Is 180 or Over  Allergies (verified): No Known Drug Allergies  Past History:  Past Medical History: Last updated: 05/05/2009 UCD Diabetes mellitus, type I Hypertension, Dx Fall 2010 Diabetic retinopathy Hearing loss CAD      - s/p MI Nov '01 PTCA  LAD      - last cath March '04 60% LAD, 1st Dx 70% ostial, 2nd Dx 70% ostial            dominant RCA w/ 30% multiple lesions, Cx 30% ostial      - Myoview March '09 -  no ischemia Hearing loss Hemachromatosis Hyperlipidemia  Past Surgical History: Last updated: 10/31/2007 right ear surgery PSH reviewed for relevance, FH reviewed for relevance  Review of Systems       The patient complains of incontinence.  The patient denies anorexia, fever, weight loss, weight gain, chest pain, syncope, dyspnea on exertion,  abdominal pain, muscle weakness, difficulty walking, and enlarged lymph nodes.    Physical Exam  General:  Tall, thin white male in no distress Eyes:  corneas and lenses clear and no injection.   Neck:  supple and full ROM.   Chest Wall:  no deformities.   Lungs:  normal respiratory effort and normal breath sounds.   Heart:  normal rate and regular rhythm.   Msk:  normal ROM, no joint tenderness, no joint swelling, no joint warmth, and no redness over joints.   Pulses:  2+ radial Neurologic:  alert & oriented X3, cranial nerves II-XII intact, strength normal in all extremities, and gait normal.   Skin:  turgor normal, color normal, and no rashes.   Psych:  Oriented X3, normally interactive, and good eye contact.     Impression & Recommendations:  Problem # 1:  HYPERTENSION (ICD-401.9) Poor control. Plan - continue metoprolol and cartia XT 300            d/c enalapril - replace with valturna (aliskirin-valsartan) 150/160  His updated medication list for this problem includes:    Enalapril Maleate 20 Mg Tabs (Enalapril maleate) .Marland Kitchen... Take 1 tablet by mouth once a day    Cartia Xt 300 Mg Xr24h-cap (Diltiazem hcl coated beads) .Marland Kitchen... 1 by mouth once daily    Metoprolol Succinate 50 Mg Xr24h-tab (Metoprolol succinate) .Marland Kitchen... Take 1 1/2 tablet by mouth daily    Clonidine Hcl 0.1 Mg Tabs (Clonidine hcl) .Marland Kitchen... 1 tab if bp is 180 or over  Problem # 2:  HYPERCHOLESTEROLEMIA (ICD-272.0)  His updated medication list for this problem includes:    Simvastatin 20 Mg Tabs (Simvastatin) .Marland Kitchen... 1 once daily  Orders: TLB-Lipid Panel (80061-LIPID) TLB-Hepatic/Liver Function Pnl (80076-HEPATIC)  Addendum - lab reveals excellent lipid levels. PLan  - continue present meds.  Problem # 3:  HEMOCHROMATOSIS (ICD-275.0) Last H/H stable.  Plan - roujtine follow-up per GI  Problem # 4:  CAD (ICD-414.00) No chest pain or physical limitations  Plan - continue present meds           follow-up with cardiology as instructed.  His updated medication list for this problem includes:    Enalapril Maleate 20 Mg Tabs (Enalapril maleate) .Marland Kitchen... Take 1 tablet by mouth once a day    Aspirin 81 Mg Tabs (Aspirin) .Marland Kitchen... Take 1 tablet by mouth two times a day    Cartia Xt 300 Mg Xr24h-cap (Diltiazem hcl coated beads) .Marland Kitchen... 1 by mouth once daily    Metoprolol Succinate 50 Mg Xr24h-tab (Metoprolol succinate) .Marland Kitchen... Take 1 1/2 tablet by mouth daily    Clonidine Hcl 0.1 Mg Tabs (Clonidine hcl) .Marland Kitchen... 1 tab if bp is 180 or over  Problem # 5:  DIABETES MELLITUS, TYPE I (ICD-250.01) Due for A1C  His updated medication list for this problem includes:    Enalapril Maleate 20 Mg Tabs (Enalapril maleate) .Marland Kitchen... Take 1 tablet by mouth once a day    Humulin 70/30 70-30 % Susp (Insulin isophane & regular) ..... Use 32 units daily  as directed    Lantus 100 Unit/ml Soln (Insulin  glargine) ..... Use 12 units daily at bedtime    Aspirin 81 Mg Tabs (Aspirin) .Marland Kitchen... Take 1 tablet by mouth two times a day  Orders: TLB-A1C / Hgb A1C (Glycohemoglobin) (83036-A1C) TLB-BMP (Basic Metabolic Panel-BMET) (80048-METABOL)  Addendum - A1C 8.3% - poorly controlled.  Plan - patient will do better with diet and continue with his present medical therapy  Complete  Medication List: 1)  Enalapril Maleate 20 Mg Tabs (Enalapril maleate) .... Take 1 tablet by mouth once a day 2)  Simvastatin 20 Mg Tabs (Simvastatin) .Marland Kitchen.. 1 once daily 3)  Levoxyl 75 Mcg Tabs (Levothyroxine sodium) .Marland Kitchen.. 1 once daily 4)  Humulin 70/30 70-30 % Susp (Insulin isophane & regular) .... Use 32 units daily  as directed 5)  Lantus 100 Unit/ml Soln (Insulin glargine) .... Use 12 units daily at bedtime 6)  Bd Disp Needles 30g X 1/2" Misc (Needle (disp)) .... Use as directed 7)  Aspirin 81 Mg Tabs (Aspirin) .... Take 1 tablet by mouth two times a day 8)  Free Style Lite Test Strips  .... Cbgs three times a day and prn 9)  Cartia Xt 300 Mg Xr24h-cap (Diltiazem hcl coated beads) .Marland Kitchen.. 1 by mouth once daily 10)  Metoprolol Succinate 50 Mg Xr24h-tab (Metoprolol succinate) .... Take 1 1/2 tablet by mouth daily 11)  Clonidine Hcl 0.1 Mg Tabs (Clonidine hcl) .Marland Kitchen.. 1 tab if bp is 180 or over  Patient: Logan [name] Note: All result statuses are Final unless otherwise noted.  Tests: (1) Lipid Panel (LIPID)   Cholesterol               129 mg/dL                   3-875     ATP III Classification            Desirable:  < 200 mg/dL                    Borderline High:  200 - 239 mg/dL               High:  > = 240 mg/dL   Triglycerides             91.0 mg/dL                  6.4-332.9     Normal:  <150 mg/dL     Borderline High:  518 - 199 mg/dL   HDL                       84.16 mg/dL                 >60.63   VLDL Cholesterol          18.2 mg/dL                  0.1-60.1   LDL Cholesterol           69 mg/dL                     0-93  CHO/HDL Ratio:  CHD Risk                             3                    Men          Women     1/2 Average Risk     3.4          3.3     Average Risk          5.0          4.4     2X Average Risk  9.6          7.1     3X Average Risk          15.0          11.0                           Tests: (2) Hepatic/Liver Function Panel (HEPATIC)   Total Bilirubin           0.8 mg/dL                   1.6-1.0   Direct Bilirubin          0.2 mg/dL                   9.6-0.4   Alkaline Phosphatase [H]  152 U/L                     39-117   AST                  [H]  40 U/L                      0-37   ALT                       38 U/L                      0-53   Total Protein             7.1 g/dL                    5.4-0.9   Albumin                   4.1 g/dL                    8.1-1.9  Tests: (3) Hemoglobin A1C (A1C)   Hemoglobin A1C       [H]  8.2 %                       4.6-6.5     Glycemic Control Guidelines for People with Diabetes:     Non Diabetic:  <6%     Goal of Therapy: <7%     Additional Action Suggested:  >8%   Tests: (4) BMP (METABOL)   Sodium                    141 mEq/L                   135-145   Potassium                 4.5 mEq/L                   3.5-5.1   Chloride                  103 mEq/L                   96-112   Carbon Dioxide            31 mEq/L                    19-32   Glucose              [  H]  104 mg/dL                   16-10   BUN                       10 mg/dL                    9-60   Creatinine                1.0 mg/dL                   4.5-4.0   Calcium                   9.3 mg/dL                   9.8-11.9   GFR                       81.38 mL/min                >60

## 2010-07-06 NOTE — Assessment & Plan Note (Signed)
Summary: FU AFTER STARTING NEW MEDS/NWS   Vital Signs:  Patient profile:   60 year old male Height:      74 inches Weight:      187 pounds BMI:     24.10 O2 Sat:      98 % on Room air Temp:     97.4 degrees F oral Pulse rate:   54 / minute BP sitting:   198 / 90  (left arm) Cuff size:   regular  Vitals Entered By: Bill Salinas CMA (July 02, 2009 10:25 AM)  O2 Flow:  Room air CC: pt here for follow up on elevated BP. Dr Juanda Chance changed pt's cartia from 180mg  to 240mg . Pt states his BP is still elevated with increase in cartia. Pt also c/o occasional heart palpatations/ ab   Primary Care Provider:  Dawud Mays  CC:  pt here for follow up on elevated BP. Dr Juanda Chance changed pt's cartia from 180mg  to 240mg . Pt states his BP is still elevated with increase in cartia. Pt also c/o occasional heart palpatations/ ab.  History of Present Illness: Presents - BP is running high at 198/90. Dr. Juanda Chance increased Nancie Neas XT to 240mg  once daily. He odes monitor pressure at home: DBP's 60-70's. SPBs occassional lower- 130s, but generally in the 140's. Current regimen includes metoprolol, enalapril, cartia Xt.   Current Medications (verified): 1)  Enalapril Maleate 20 Mg  Tabs (Enalapril Maleate) .... Take 1 Tablet By Mouth Once A Day 2)  Simvastatin 20 Mg  Tabs (Simvastatin) .Marland Kitchen.. 1 Once Daily 3)  Levoxyl 75 Mcg  Tabs (Levothyroxine Sodium) .Marland Kitchen.. 1 Once Daily 4)  Humulin 70/30 70-30 %  Susp (Insulin Isophane & Regular) .... Use 32 Units Daily  As Directed 5)  Lantus 100 Unit/ml  Soln (Insulin Glargine) .... Use 12 Units Daily At Bedtime 6)  Bd Disp Needles 30g X 1/2"  Misc (Needle (Disp)) .... Use As Directed 7)  Aspirin 81 Mg  Tabs (Aspirin) .... Take 1 Tablet By Mouth Two Times A Day 8)  Free Style Lite Test Strips .... Cbgs Three Times A Day and Prn 9)  Cartia Xt 240 Mg Xr24h-Cap (Diltiazem Hcl Coated Beads) .... One Tab By Mouth Once Daily 10)  Metoprolol Succinate 50 Mg Xr24h-Tab (Metoprolol  Succinate) .... Take 1 1/2 Tablet By Mouth Daily  Allergies (verified): No Known Drug Allergies PMH-FH-SH reviewed-no changes except otherwise noted  Review of Systems  The patient denies weight loss, hoarseness, chest pain, syncope, dyspnea on exertion, headaches, abnormal bleeding, and angioedema.    Physical Exam  General:  Well-developed,well-nourished,in no acute distress; alert,appropriate and cooperative throughout examination Head:  Normocephalic and atraumatic without obvious abnormalities. No apparent alopecia or balding. Eyes:  corneas and lenses clear and no injection.   Lungs:  normal respiratory effort.   Heart:  normal rate and regular rhythm.   Neurologic:  alert & oriented X3, cranial nerves II-XII intact, and gait normal.   Skin:  turgor normal, color normal, and no rashes.   Psych:  Oriented X3, memory intact for recent and remote, and normally interactive.     Impression & Recommendations:  Problem # 1:  HYPERTENSION (ICD-401.9)  sub-optimal control. Plan - continue present meds but increase cartia xt to 300mg  once daily.           Of note - he had a difficult time with diuretics.  His updated medication list for this problem includes:    Enalapril Maleate 20 Mg Tabs (Enalapril maleate) .Marland KitchenMarland KitchenMarland KitchenMarland Kitchen  Take 1 tablet by mouth once a day    Cartia Xt 300 Mg Xr24h-cap (Diltiazem hcl coated beads) .Marland Kitchen... 1 by mouth once daily    Metoprolol Succinate 50 Mg Xr24h-tab (Metoprolol succinate) .Marland Kitchen... Take 1 1/2 tablet by mouth daily  Orders: Prescription Created Electronically [phone])  Complete Medication List: 1)  Enalapril Maleate 20 Mg Tabs (Enalapril maleate) .... Take 1 tablet by mouth once a day 2)  Simvastatin 20 Mg Tabs (Simvastatin) .Marland Kitchen.. 1 once daily 3)  Levoxyl 75 Mcg Tabs (Levothyroxine sodium) .Marland Kitchen.. 1 once daily 4)  Humulin 70/30 70-30 % Susp (Insulin isophane & regular) .... Use 32 units daily  as directed 5)  Lantus 100 Unit/ml Soln (Insulin glargine) .... Use 12  units daily at bedtime 6)  Bd Disp Needles 30g X 1/2" Misc (Needle (disp)) .... Use as directed 7)  Aspirin 81 Mg Tabs (Aspirin) .... Take 1 tablet by mouth two times a day 8)  Free Style Lite Test Strips  .... Cbgs three times a day and prn 9)  Cartia Xt 300 Mg Xr24h-cap (Diltiazem hcl coated beads) .Marland Kitchen.. 1 by mouth once daily 10)  Metoprolol Succinate 50 Mg Xr24h-tab (Metoprolol succinate) .... Take 1 1/2 tablet by mouth daily Prescriptions: METOPROLOL SUCCINATE 50 MG XR24H-TAB (METOPROLOL SUCCINATE) Take 1 1/2 tablet by mouth daily  #135 x 3   Entered and Authorized by:   Jacques Navy MD   Signed by:   Jacques Navy MD on 07/02/2009   Method used:   Electronically to        [facility]* (mail-order)             ,          Ph: [phone]       Fax: [phone]   RxID:   [id number] LEVOXYL 75 MCG  TABS (LEVOTHYROXINE SODIUM) 1 once daily  #90 x 3   Entered and Authorized by:   Jacques Navy MD   Signed by:   Jacques Navy MD on 07/02/2009   Method used:   Electronically to        [facility]* (mail-order)             ,          Ph: [phone]       Fax: [phone]   RxID:   [id number] SIMVASTATIN 20 MG  TABS (SIMVASTATIN) 1 once daily  #90 x 3   Entered and Authorized by:   Jacques Navy MD   Signed by:   Jacques Navy MD on 07/02/2009   Method used:   Electronically to        [facility]* (mail-order)             ,          Ph: [phone]       Fax: [phone]   RxID:   [id number] ENALAPRIL MALEATE 20 MG  TABS (ENALAPRIL MALEATE) Take 1 tablet by mouth once a day  #90 x 3   Entered and Authorized by:   Jacques Navy MD   Signed by:   Jacques Navy MD on 07/02/2009   Method used:   Electronically to        [facility]* (mail-order)             ,          Ph: [phone]       Fax: [phone]   RxID:  [id number] CARTIA XT 300 MG XR24H-CAP (DILTIAZEM HCL COATED BEADS) 1 by mouth once daily  #30 x 12    Entered and Authorized by:   Jacques Navy MD   Signed by:   Jacques Navy MD on 07/02/2009   Method used:   Handwritten   RxID:   [id number]

## 2010-07-06 NOTE — Progress Notes (Signed)
  Phone Note Other Incoming   Request: Send information Action Taken: Software engineer of Call: Received a completed [facility] release form from the patient. Patient is requesting Dr. Joneen Boers records from July 2006 thru July 2011 for Insurance Certification. Patient has circled H & P and checked off Office Progress Notes, Lab test and Xray reports. Request forwarded to [facility]. Patient was given a copy of [employer]'s Fee schedule and was advised of the 7 to 10 Business days for processing.

## 2010-07-06 NOTE — Progress Notes (Signed)
Summary: REFILL  Phone Note Refill Request   Refills Requested: Medication #1:  HUMULIN 70/30 70-30 %  SUSP use 32 units daily  as directed OK FOR RX w/3rfs to go to [facility]?   Initial call taken by: Lamar Sprinkles, CMA,  September 18, 2009 9:50 AM  Follow-up for Phone Call        OK to refill as needed  Follow-up by: Jacques Navy MD,  September 18, 2009 10:40 AM    Prescriptions: HUMULIN 70/30 70-30 %  SUSP (INSULIN ISOPHANE & REGULAR) use 32 units daily  as directed  #3 mth x 3   Entered by:   Lamar Sprinkles, CMA   Authorized by:   Jacques Navy MD   Signed by:   Lamar Sprinkles, CMA on 09/18/2009   Method used:   Telephoned to ...       [facility]* (mail-order)             ,          Ph: [phone]       Fax: [phone]   RxID:   [id number]

## 2010-07-06 NOTE — Progress Notes (Signed)
Summary: Set up f/u appt   Phone Note Outgoing Call   Call placed by: Sherri Rad, RN, BSN,  December 01, 2009 11:00 AM Call placed to: Patient Summary of Call: I left a message for the patient to call. Per Dr. Juanda Chance- set up a f/u appt in the next few weeks.  Initial call taken by: Sherri Rad, RN, BSN,  December 01, 2009 11:01 AM  Follow-up for Phone Call        I spoke with the pt. Appt. scheduled for 12/24/09 @ 2:15pm. The pt is aware and agreeable. Follow-up by: Sherri Rad, RN, BSN,  December 02, 2009 5:11 PM

## 2010-07-06 NOTE — Progress Notes (Signed)
Summary: RX FOR VALTURNA  Phone Note Call from Patient Call back at Home Phone [phone]   Caller: Patient Summary of Call: Pt. has 2 more pills of Valturna 300mg /320mg  samples.  He states they seem to be doing the jov and wants to know if Dr. Debby Bud wants him to continue on them.  If so, [facility] Cannot make out [facility] he wrote on the walk[facility]in sheet.  LMOM for him to call with a pharmacy location. Initial call taken by: Hilarie Fredrickson,  Oct 22, 2009 1:55 PM  Follow-up for Phone Call        Do you want me to [facility], Please Advise Follow-up by: Ami Bullins CMA,  Oct 22, 2009 3:26 PM  Additional Follow-up for Phone Call Additional follow up Details #1::        yes please - valturna 300/320 1 once daily # 30 with as needed refills Additional Follow-up by: Jacques Navy MD,  Oct 23, 2009 3:38 PM    New/Updated Medications: VALTURNA 300-320 MG TABS (ALISKIREN-VALSARTAN) 1 tab daily Prescriptions: VALTURNA 300-320 MG TABS (ALISKIREN-VALSARTAN) 1 by mouth once daily  #30 x 12   Entered by:   Lamar Sprinkles, CMA   Authorized by:   Jacques Navy MD   Signed by:   Lamar Sprinkles, CMA on 10/23/2009   Method used:   [facility]        [facility] [phone]* (retail)       [address]       [place], [place]  [address]       Ph: [phone] or [phone]       Fax: [phone]   RxID:   [id number]

## 2010-07-06 NOTE — Assessment & Plan Note (Signed)
Summary: bpc-lb  Nurse Visit   Vital Signs:  Patient profile:   60 year old male BP sitting:   156 / 82  (left arm) Cuff size:   regular  Vitals Entered By: Bill Salinas CMA (September 18, 2009 10:05 AM)  Allergies: No Known Drug Allergies

## 2010-07-06 NOTE — Assessment & Plan Note (Signed)
Summary: 6wk f/u   Visit Type:  Follow-up Primary Provider:  Norins  CC:  bp is up and down.  History of Present Illness: The patient is 60 years old and return for followup management of CAD. 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 He had nonobstructive disease at catheterization in 2004. He had a negative Myoview scan in 2009 with an ejection fraction of 49%.  He has been doing well recently but has had problems with his blood pressure. He monitors this at home and said his readings have been in the 130s and occasionally in the 140s and occasionally in the 150s. He's had no symptoms with this.  His other problems include hyperlipidemia, diabetes, and history of hemochromatosis.  Current Medications (verified): 1)  Enalapril Maleate 20 Mg  Tabs (Enalapril Maleate) .... Take 1 Tablet By Mouth Once A Day 2)  Simvastatin 20 Mg  Tabs (Simvastatin) .Marland Kitchen.. 1 Once Daily 3)  Levoxyl 75 Mcg  Tabs (Levothyroxine Sodium) .Marland Kitchen.. 1 Once Daily 4)  Humulin 70/30 70-30 %  Susp (Insulin Isophane & Regular) .... Use 32 Units Daily  As Directed 5)  Lantus 100 Unit/ml  Soln (Insulin Glargine) .... Use 12 Units Daily At Bedtime 6)  Bd Disp Needles 30g X 1/2"  Misc (Needle (Disp)) .... Use As Directed 7)  Aspirin 81 Mg  Tabs (Aspirin) .... Take 1 Tablet By Mouth Two Times A Day 8)  Free Style Lite Test Strips .... Cbgs Three Times A Day and Prn 9)  Cartia Xt 180 Mg Xr24h-Cap (Diltiazem Hcl Coated Beads) .Marland Kitchen.. 1 By Mouth Once Daily 10)  Metoprolol Succinate 50 Mg Xr24h-Tab (Metoprolol Succinate) .... Take One Tablet By Mouth Daily  Allergies (verified): No Known Drug Allergies  Past History:  Past Medical History: Reviewed history from 05/05/2009 and no changes required. UCD Diabetes mellitus, type I Hypertension, Dx Fall 2010 Diabetic retinopathy Hearing loss CAD      - s/p MI Nov '01 PTCA  LAD      - last cath March '04 60% LAD, 1st Dx 70% ostial, 2nd Dx 70% ostial  dominant RCA w/ 30% multiple lesions, Cx 30% ostial      - Myoview March '09 -  no ischemia Hearing loss Hemachromatosis Hyperlipidemia  Review of Systems       ROS is negative except as outlined in HPI.   Vital Signs:  Patient profile:   60 year old male Height:      74 inches Weight:      186 pounds BMI:     23.97 Pulse rate:   51 / minute BP sitting:   154 / 58  (left arm) Cuff size:   regular  Vitals Entered By: Burnett Kanaris, CNA (June 09, 2009 12:09 PM)  Physical Exam  Additional Exam:  Gen. Well-nourished, in no distress   Neck: No JVD, thyroid not enlarged, no carotid bruits Lungs: No tachypnea, clear without rales, rhonchi or wheezes Cardiovascular: Rhythm regular, PMI not displaced,  heart sounds  normal, no murmurs or gallops, no peripheral edema, pulses normal in all 4 extremities. Abdomen: BS normal, abdomen soft and non-tender without masses or organomegaly, no hepatosplenomegaly. MS: No deformities, no cyanosis or clubbing   Neuro:  No focal sns   Skin:  no lesions    Impression & Recommendations:  Problem # 1:  CAD (ICD-414.00) Patient had remote PTCA of LAD after an MI. He has had no chest pain and had a negative  Myoview in 2009. This problem appears stable. His updated medication list for this problem includes:    Enalapril Maleate 20 Mg Tabs (Enalapril maleate) .Marland Kitchen... Take 1 tablet by mouth once a day    Aspirin 81 Mg Tabs (Aspirin) .Marland Kitchen... Take 1 tablet by mouth two times a day    Cartia Xt 240 Mg Xr24h-cap (Diltiazem hcl coated beads) ..... One tab by mouth once daily    Metoprolol Succinate 50 Mg Xr24h-tab (Metoprolol succinate) .Marland Kitchen... Take one tablet by mouth daily  His updated medication list for this problem includes:    Enalapril Maleate 20 Mg Tabs (Enalapril maleate) .Marland Kitchen... Take 1 tablet by mouth once a day    Aspirin 81 Mg Tabs (Aspirin) .Marland Kitchen... Take 1 tablet by mouth two times a day    Cartia Xt 240 Mg Xr24h-cap (Diltiazem hcl coated beads)  ..... One tab by mouth once daily    Metoprolol Succinate 50 Mg Xr24h-tab (Metoprolol succinate) .Marland Kitchen... Take one tablet by mouth daily  Problem # 2:  DIABETES MELLITUS, TYPE I (ICD-250.01) His blood pressure has been somewhat elevated by his home readings and is elevated today. We will increase his cardiac state from 180 mg to 240 mg daily. We'll have him to followup with Dr. Lyman Bishop in a few weeks. I suggested that we target a systolic pressure of less than 1:30 to 140. His updated medication list for this problem includes:    Enalapril Maleate 20 Mg Tabs (Enalapril maleate) .Marland Kitchen... Take 1 tablet by mouth once a day    Humulin 70/30 70-30 % Susp (Insulin isophane & regular) ..... Use 32 units daily  as directed    Lantus 100 Unit/ml Soln (Insulin glargine) ..... Use 12 units daily at bedtime    Aspirin 81 Mg Tabs (Aspirin) .Marland Kitchen... Take 1 tablet by mouth two times a day  Problem # 3:  HYPERCHOLESTEROLEMIA (ICD-272.0)  This is controlled on current medications. His updated medication list for this problem includes:    Simvastatin 20 Mg Tabs (Simvastatin) .Marland Kitchen... 1 once daily  His updated medication list for this problem includes:    Simvastatin 20 Mg Tabs (Simvastatin) .Marland Kitchen... 1 once daily  Patient Instructions: 1)  Your physician has recommended you make the following change in your medication: 1) Increase Cartia XT to 240mg  once daily  2)  Your physician wants you to follow-up in: 1 year with Dr. Shirlee Latch.  You will receive a reminder letter in the mail two months in advance. If you don't receive a letter, please call our office to schedule the follow-up appointment. 3)  Please schedule a followup appointment 1-2 weeks with Dr. Debby Bud. Prescriptions: CARTIA XT 240 MG XR24H-CAP (DILTIAZEM HCL COATED BEADS) one tab by mouth once daily  #30 x 6   Entered by:   Sherri Rad, RN, BSN   Authorized by:   Lenoria Farrier, MD, [facility]   Signed by:   Sherri Rad, RN, BSN on 06/09/2009   Method used:    [facility] [phone]* (retail)       [address]       [place], [place]  [address]       Ph: [phone] or [phone]       Fax: [phone]   RxID:   [id number]

## 2010-09-13 ENCOUNTER — Other Ambulatory Visit: Payer: Self-pay

## 2010-09-13 ENCOUNTER — Other Ambulatory Visit: Payer: Self-pay | Admitting: Gastroenterology

## 2010-09-21 ENCOUNTER — Other Ambulatory Visit (INDEPENDENT_AMBULATORY_CARE_PROVIDER_SITE_OTHER): Payer: PRIVATE HEALTH INSURANCE

## 2010-09-21 ENCOUNTER — Telehealth: Payer: Self-pay | Admitting: *Deleted

## 2010-09-21 LAB — HEMATOCRIT: HCT: 42.4 % (ref 39.0–52.0)

## 2010-09-21 NOTE — Telephone Encounter (Signed)
Pt had his blood drawn in the lab. Pt's wife called and stated that he did not feel well and that she was unable to get a blood pressure with his electronic BP device. Pt denied weakness, fatigue or being light headed. I ask pt's wife to check his BP again she checked his bp and it was 125-80. Pt states he all of a sudden felt much better when he was on the phone with me and will call back if he feels need be

## 2010-10-19 NOTE — Assessment & Plan Note (Signed)
[facility]                            CARDIOLOGY OFFICE NOTE   NAME:Logan [name], Logan [name]                       MRN:          [id number]  DATE:09/06/2007                            DOB:          03-11-1951    PRIMARY CARE PHYSICIAN:  Rosalyn Gess. Norins, MD.   CLINICAL HISTORY:  Logan [name] is 60 years old and works as a Garment/textile technologist for [employer].  He returned for evaluation of recent chest pain.  He  had a myocardial infarction in 2001, treated with PTCA of the LAD.  His  last catheterization was in 2004, which showed nonobstructive disease.  I recently saw him for chest pain and since that was atypical, I did a  Myoview scan which showed no definite ischemia, although some decreased  calcium in the anterior wall and the ejection fraction was 49%.   He had no recent chest pain, shortness breath or palpitations.   PAST MEDICAL HISTORY:  Significant for hypertension, insulin-dependent  diabetes, hyperlipidemia and a history of hemochromatosis.  He also  indicated that Dr. Burnadette Peter has recommend a colonoscopy, but he declined.   CURRENT MEDICATIONS:  1. Toprol 50 mg daily.  2. Zocor 20 mg daily.  3. Lantus insulin and regular insulin.  4. Levoxyl.  5. Hydrochlorothiazide 25 mg daily.  6. Enalapril 20 mg daily.  7. Aspirin 81 mg b.i.d.   PHYSICAL EXAMINATION:  VITAL SIGNS:  Blood pressure 165/81, pulse 60 and  regular.  NECK:  There was no venous distension.  The carotid pulses were full  without bruits.  CHEST:  Clear.  HEART:  The cardiac rhythm was regular.  No murmurs or gallops.  ABDOMEN:  Soft without organomegaly.  Peripheral pulses were full and  there is no peripheral edema.   LABORATORY DATA:  His recent scan showed an ejection fraction of 49%.  There is no ischemia, but there was some decreased counts in the  anterior wall.   IMPRESSION:  1. Coronary artery disease, status post prior anterior wall myocardial      infarction, treated with  a percutaneous transluminal coronary      angioplasty to the left anterior descending with nonobstructive      disease at last catheterization.  2. Recent chest pain, probably noncardiac with nonischemic Myoview      scan.  3. History of hemochromatosis with normal hemoglobin and low serum      iron on recent laboratory analysis.  4. Hypertension.  5. Hyperlipidemia.  6. Insulin-dependent diabetes.   RECOMMENDATIONS:  I think Logan [name] is doing well.  Will plan to get an  echocardiogram because of his history of hemochromatosis to make sure  there is no cardiac involvement.  If this is okay, we will see him back  in a year.  He would be normally due for a colonoscopy, but he has  declined this.     Bruce Elvera Lennox Juanda Chance, MD, [facility]  Electronically Signed    BRB/MedQ  DD: 09/06/2007  DT: 09/06/2007  Job #: [id number]

## 2010-10-19 NOTE — Assessment & Plan Note (Signed)
[facility]                            CARDIOLOGY OFFICE NOTE   NAME:[name], [name]                       MRN:          [id number]  DATE:08/14/2007                            DOB:          [birth year]    PRIMARY CARE PHYSICIAN:  Rosalyn Gess. 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  He had a myocardial infarction in 2001 treated  with PTCA of the LAD.  We do not have the records for that.  He had  catheterization again in 2002 and 2004, which showed nonobstructive  coronary disease.  We have not seen him since his discharge in 2004, but  he has seen Dr. Debby Bud for primary care.   Over the last week he has developed chest pain, which he describes as a  well-localized clot in the front left side of his chest about the size  of a 50-cent piece.  He says this sometimes comes on at rest and  sometimes with exertion and sometimes with deep breathing.  He has had  no associated shortness of breath, diaphoresis or palpitations.  He says  this is a little bit different than his previous heart attacks but there  might be some similarities.   PAST MEDICAL HISTORY:  Significant for hypertension, insulin-dependent  diabetes and hyperlipidemia.  He also has a history of hemochromatosis.  I do not have any details about any organ involvement.   CURRENT MEDICATIONS:  1. Altace 10 mg daily.  2. Levoxyl 75 mcg daily.  3. Toprol XL 50 mg daily.  4. Zocor 20 mg daily.  5. Hydrochlorothiazide 25 mg daily.  6. Insulin 70/30 17 units in the morning and 12 units in the      afternoon.  7. Lantus 10 units at bedtime.  8. A baby aspirin twice a day.   SOCIAL HISTORY:  He lives in [place] with his wife.  He works as a  Building control surveyor.  He does not smoke.   FAMILY HISTORY:  Strong for diabetes but there is no coronary heart  disease in his mother, father or siblings.   REVIEW OF SYSTEMS:  Positive for fatigue.   EXAMINATION:  Blood pressure 134/72 and the pulse 72 and regular.  There was no venous distention.  Carotid pulses were full without  bruits.  CHEST:  Clear without rales or rhonchi.  HEART:  Rhythm is regular.  There were no murmurs or gallops.  ABDOMEN:  Soft without organomegaly.  Peripheral pulses were full.  There was no peripheral edema.   His electrocardiogram showed Q waves in V1 and V2 but otherwise there  were no acute changes and no change from his previous cardiogram.   IMPRESSION:  1. Chest pain, atypical for ischemia.  2. Coronary artery disease, status post prior myocardial infarction in      2001 treated with percutaneous transluminal coronary angioplasty      and subsequent nonobstructive disease at catheterization in 2002  and 2004.  3. Hypertension.  4. Hyperlipidemia.  5. Insulin-dependent diabetes.  6. History of hemochromatosis.   RECOMMENDATIONS:  We will plan to get a rest-stress Myoview to evaluate  the pain further, although I think his pain is probably not cardiac.  I  think more probably it is musculoskeletal.  We will also get laboratory  studies including a CBC, a BMP, liver function tests, lipid profile,  serum iron and iron-binding capacity.  We will decide about follow-up  after we see the results of his tests.     Bruce Elvera Lennox Juanda Chance, MD, [facility]  Electronically Signed    BRB/MedQ  DD: 08/14/2007  DT: 08/15/2007  Job #: [id number]

## 2010-10-22 NOTE — H&P (Signed)
NAME:  Logan [name], Logan [name]                          ACCOUNT NO.:  [id number]   MEDICAL RECORD NO.:  [id number]                   PATIENT TYPE:  INP   LOCATION:  2921                                 FACILITY:  [facility]   PHYSICIAN:  Willa Rough, M.D. [facility]              DATE OF BIRTH:  September 19, 1950   DATE OF ADMISSION:  08/20/2002  DATE OF DISCHARGE:                                HISTORY & PHYSICAL   CHIEF COMPLAINT:  Chest pain.   HISTORY OF PRESENT ILLNESS:  The patient is a 60 year old male with a  history of coronary artery disease, who has been having nonexertional  substernal chest pain at a 1-2/10 for greater than one year.  It is episodic  and he was catheterized for this in July 2002, and that catheterization  showed multiple 50%-60% lesions, but no critical disease.  He has symptoms  three or four times a month, and they increase in frequency during hot  weather.  These symptoms are similar to his myocardial infarction symptoms,  but less severe, and there are no associated symptoms.  It is described as a  burning pain.  Today he was ambulating room to room without any unusual  exertion, and he had the substernal chest pain that he normally gets, but it  was associated today with left arm pain and diaphoresis.  Today is the first  time he has had these symptoms since he had his myocardial infarction in  2001.  He took three of his home nitroglycerin which he states were old, and  got no relief.  He was given two more nitroglycerin at the [facility] and also got no relief.  He had morphine as well, and no  significant change in his symptoms.  [facility] was contacted and he  was transferred to [facility] for further evaluation  and treatment.  His symptoms come and go, and two or three times during the  conservation they were present, but they generally resolve before there is a  chance to get an electrocardiogram.  The left arm pain and  diaphoresis  lasted for about 30 minutes several hours ago, and have not returned.  Additionally, he has rare sharp chest pain that is 5-6/10, that lasts less  than 10 seconds, and he had one of those today, but that has also not  recurred.   PAST MEDICAL HISTORY:  1. Significant for insulin-dependent diabetes mellitus.  2. Hypertension.  3. Dyslipidemia.  4. He has a history of hemachromatosis.  His last blood draw was two weeks     ago, and he is to get one done every other month at this time.  5. He has a history of a myocardial infarction in 2001, with the LAD     totalled and with the stenosis being reduced to less than 10% with a     percutaneous coronary angiography.  He had a cardiac catheterization in     July 2002, showing a 20% left main, an LAD with 50%, and then 60%     lesions, and a first diagonal with a 50% lesion and the second diagonal     with a 60% also.  The circumflex was 30%.  The RCA had a 30%, then a 20%,     then a 60% stenosis, and the PDA was 40%.  His ejection fraction was 50%-     60% at catheterization with apical akinesis.  He denies any family history of coronary artery disease, tobacco, and he is  not obese.   PAST SURGICAL HISTORY:  1. He has had a cardiac catheterization x2.  2. He has had ear surgery a long time ago.   SOCIAL HISTORY:  He lives in [place] with his wife.  He is a English as a second language teacher.  He has never smoked and rarely drinks alcohol.  He does not abuse  alcohol or drugs.   ALLERGIES:  No known drug allergies.    MEDICATIONS:  1. Coated aspirin 325 mg daily.  2. Insulin 70/30, 18 units q.a.m., 7 units q. p.m.  3. Altace 10 mg daily.  4. Zocor 20 mg daily.  5. Toprol XL 50 mg daily.   FAMILY HISTORY:  His mother died in her 90s without coronary artery disease.  His father is alive at age 76, without coronary artery disease.  He has  siblings, but they do not have coronary artery disease.  There is a strong  family history  of diabetes mellitus.   REVIEW OF SYSTEMS:  Positive for the chest pain, but he denies any shortness  of breath, dyspnea on exertion, orthopnea, paroxysmal nocturnal dyspnea,  edema, or palpitations.  There is no cough or wheezing.  He has not had any  recent illnesses.  He has been having some aches and pain in his left  shoulder, and this has not changed significantly.  There is definitely a  positional and exertional component to this.  The review of systems is  otherwise negative.   PHYSICAL EXAMINATION:  VITAL SIGNS:  Temperature 97.3, pulse 68,  respirations 16, blood pressure 126/52.  Saturation 97% on 2 L.  GENERAL:  He is a slender, slightly anxious-appearing white male, in no  acute distress.  HEENT:  Normocephalic, atraumatic.  Extraocular movements intact, and poor  dentition.  Ears are without discharge.  NECK:  Supple and without lymphadenopathy, thyromegaly, bruit, or jugular  venous distention.  He has no lymphadenopathy noted.  CARDIOVASCULAR:  His heart has a  regular rate and rhythm with an S1 and S2,  and no significant murmur, rub, or gallop.  His pulses are 2+ and equal, and  no femoral bruits are appreciated.  LUNGS:  Clear to auscultation bilaterally.  SKIN:  No rashes or lesions.  ABDOMEN:  Firm, nontender, with active bowel sounds.  No hepatosplenomegaly  noted.  EXTREMITIES:  No clubbing, cyanosis, or edema.  MUSCULOSKELETAL:  There is no joint deformity or effusion.  No spine or CVA  tenderness.  NEUROLOGIC:  He is alert and oriented, with the cranial nerves II-XII  grossly intact.  Strength is 5/5 in the extremities.  Sensation is  superficially normal.  A chest x-ray performed at [facility] is without acute disease.  An electrocardiogram revealed sinus bradycardia with a rate of 57 and no  acute changes, although there is a 1 mm J-point elevation in V2 and V3. This  is unchanged from the electrocardiogram dated August 20, 2002, at 10  a.m.    LABORATORY DATA:  Hemoglobin 14.2, hematocrit 43.0, WBC 7.2, platelets 216.  Sodium 139, potassium 3.1, chloride 99, CO2 of 31, BUN 14, creatinine 1.0,  glucose 98.  CK-MB initially 346/4.3, with a troponin I of 0.01.   ASSESSMENT/PLAN:  Chest pain:  His pain is atypical in general, but the left  arm pain and diaphoresis are concerning.  The best way to evaluate this  patient is by a cardiac catheterization, as he may have atypical symptoms  with diabetes mellitus.  This is scheduled for Wednesday, August 21, 2002, in  the a.m.  He will be transferred from Lovenox to heparin since the time of  the catheterization is not definite, and he will be started on a  nitroglycerin drip,  in an attempt to keep him pain-free.  He is at low risk for a pulmonary  embolus.  We will continue to cycle enzymes as well.  The patient was seen  by Dr. Willa Rough, and the situation was discussed.  Dr. Myrtis Ser determined  the plan of care.  We will initiate this.      Lavella Hammock, P.A. [facility]                  Willa Rough, M.D. [facility]    RG/MEDQ  D:  08/20/2002  T:  08/20/2002  Job:  [id number]

## 2010-10-22 NOTE — Discharge Summary (Signed)
NAME:  BRYTEN, MAHER                          ACCOUNT NO.:  [id number]   MEDICAL RECORD NO.:  [id number]                   PATIENT TYPE:  INP   LOCATION:  2921                                 FACILITY:  [facility]   PHYSICIAN:  Willa Rough, M.D. [facility]              DATE OF BIRTH:  08-07-1950   DATE OF ADMISSION:  08/20/2002  DATE OF DISCHARGE:  08/22/2002                           DISCHARGE SUMMARY - REFERRING   PROCEDURES:  1. Cardiac catheterization.  2. Coronary arteriogram.  3. Left ventriculogram.   HOSPITAL COURSE:  The patient is a 60 year old male with known coronary  artery disease.  He had an MI in 2001 with a PTCA to his LAD.  He had a  repeat catheterization for chest pain in 7/02 which showed noncritical  coronary artery disease with multiple 30-60% lesions.  He has continued to  have episodes of chest pain three of four times a month with more symptoms  in hot weather.  He had chest pain on the day of admission that was  associated with diaphoresis and pain into the left arm.  He has not had the  associated symptoms since his MI.  He was admitted to rule out MI and for  further evaluation.   His enzymes were negative for MI, and he was scheduled for a cardiac  catheterization which was performed on 08/21/02.  The cardiac catheterization  showed a normal left main, an LAD with a 60% long stenosis in the mid-  vessel.  The first diagonal and the second diagonal both had a 30% ostial  stenosis.  The RCA had a 30% proximal and 30% distal stenosis with a 30%  stenosis in the PDA.  On the left ventriculogram, he had apical hypokinesis  with an EF of 55% and no MR.  The films were reviewed by Dr. Samule Ohm and Dr.  Eden Emms who felt that there was no significant change in his disease.  It was  discussed between the physicians who felt that medical therapy was the best  option.  He needs tight control of his blood sugar and Cardiolite in six  months.   By 08/22/02, the nitroglycerin  and other medications have been discontinued.  He had a total cholesterol profile which showed a cholesterol of 140,  triglycerides 125, HDL 51, and LDL 64, so no medication changes were made.   As part of his workup, the patient had a TSH checked which was elevated as  23.257.  His free T4 was 0.7, and his free T3 was 2.8, both within normal  limits.  No medication changes were made at this time, but he is to follow  up with his primary care physician.   Because he was ambulating without chest pain or shortness of breath and his  groin was stable, the patient was considered stable for discharge on 08/22/02  with an outpatient Cardiolite in six months and followup in  the office prior  to that.   LABORATORY VALUES:  Hemoglobin 14.8, hematocrit 42.5, WBC 6.1, platelets  201,000.  Sodium 133, potassium 4, chloride 95, CO2 31, BUN 14, creatinine  1, glucose 173.  Hemoglobin A1C 7.7.  Free T4 initial check 0.70 with a TSH  of 23.257, and a T3 of 2.8.  CK-MB minimally elevated at 312/6 with an index  of 1.9 and negative troponin-I.   DISCHARGE CONDITION:  Improved.   DISCHARGE DIAGNOSES:  1. Chest pain, no critical coronary artery disease by catheterization,     follow up in the office.  2. Insulin-dependent diabetes mellitus.  3. Hyperlipidemia.  4. Hemachromatosis.  5. Status post myocardial infarction in 2001 with percutaneous transluminal     coronary angioplasty to the left anterior descending which was total.  6. Preserved left ventricular function with an ejection fraction of 55% by     catheterization this admission.  7. Hypertension.  8. Ongoing left shoulder pain.  9. Hypokalemia on admission, resolved.   DISCHARGE INSTRUCTIONS:  His activity level is to include no driving, sexual  or strenuous activities for two days.  He is to call the office for problems  with the catheterization site.  He is to follow up with Dr.  Learta Codding, M.D.  on 4/6 and keep his appointment with  Rosalyn Gess. Norins,  M.D.   DISCHARGE MEDICATIONS:  1. Coated aspirin 325 mg daily.  2. Insulin 18 units a.m., 7 units p.m.  3. Altace 10 mg daily.  4. Zocor 20 mg daily.  5. Toprol XL 50 mg daily.  6. Nitroglycerin p.r.n.     Lavella Hammock, P.A. [facility]                  Willa Rough, M.D. [facility]    RG/MEDQ  D:  08/22/2002  T:  08/22/2002  Job:  [id number]   cc:   Rosalyn Gess. Norins, M.D. [facility]   Learta Codding, M.D. [facility]  [address]  [address]  [place]  [place] [address]

## 2010-10-22 NOTE — Assessment & Plan Note (Signed)
[facility]                           PRIMARY CARE OFFICE NOTE   NAME:Logan [name], Logan [name]                       MRN:          [id number]  DATE:09/11/2006                            DOB:          1950-10-02    Logan [name] is a 60 year old gentleman who is followed for multiple  medical problems including diabetes, hypertension, coronary artery  disease, who presents for a follow up evaluation.  He was seen June 21, 2006 for a medication refill.  He reports at that time he had one  episode of chest pain.  His blood sugar has been running high in the 200  to 300 range.  He has been seeing Dr. 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   INTERVAL HISTORY:  The patient reports that on July 13, 2006, he was  struck down by an influenza-like illness with severe back pain at the  level of his kidneys.  He was seen at urgent care, had a positive nasal  swab for influenza A, and was treated with Tamiflu with good  improvement.  He did have a persistent post-influenza cough which lasted  for about a month, and it took him about that long to recover.   The patient reports he has had recurrent UTIs with dysuria and postvoid  dribble, currently stable without active symptoms.   The patient continues to have shoulder pain, some hip pain.  He has been  told by Dr. Ophelia Charter that he has no rheumatoid arthritis and probably has  degenerative joint disease.   The patient reports he is having ongoing leg cramps.   PAST MEDICAL HISTORY:  Surgical and medical is well documented in my  note of August 10, 2005 with no significant change.   CURRENT MEDICATIONS:  1. Toprol-XL 50 mg daily.  2. Altace 10 mg daily.  3. Zocor 20 mg daily.  4. Aspirin 81 mg daily.  5. Insulin 70/30, 17 units q.a.m., 12 units q.p.m.  6. Lantus 10 units q.h.s.  7. Levoxyl 75 mcg daily.  8. Hydrochlorothiazide 25  medications daily.   REVIEW OF SYMPTOMS:  The patient has had no fever, sweats, chills,  except as noted above with his influenza.  He had had a recent eye exam,  had been seen in Dr. Inda Coke practice, and is seen every 6 months with  no active diabetic retinopathy.  No ENT, cardiovascular, respiratory, GI  or GU problems.  Of note, the patient does plan to get some dental work  done.  He is not sure if he is going to have extractions or other  repair.   SOCIAL HISTORY:  The patient reports he recently had the death of his  brother-in-law.  The patient himself has been widowed approximately 3  years.  He continues to work full time.  He is very active and engaged  with his family.   PHYSICAL EXAMINATION:  VITAL SIGNS:  Temperature was 97.4, blood  pressure 161/79, pulse 67.  Weight 182.  Height 6 feet.  GENERAL APPEARANCE:  This is a well-nourished, well-developed gentleman  in no acute distress.  HEENT:  Normocephalic and atraumatic.  EAC's with some cerumen, but no  impaction.  TM's were visualized and normal.  Oropharynx revealed the  patient to have many missing teeth.  He does have one carious tooth.  His remaining dentition does seem to be sound.  No buccal lesions were  noted.  No gingivitis was noted.  Posterior pharynx was clear.  Conjunctivae and sclerae were clear.  Pupils equal, round and reactive.  Further exam deferred to ophthalmology.  NECK:  Supple without thyromegaly.  NODES:  No adenopathy was noted in the cervical or supraclavicular  regions.  CHEST:  No CVA tenderness.  LUNGS:  Clear to auscultation and percussion with no rales, wheezes or  rhonchi.  CARDIOVASCULAR:  There was 2+ radial pulse.  No JVD or carotid bruits.  He had a quiet precordium with regular rate and rhythm without murmurs,  rubs, or gallops.  ABDOMEN:  Soft.  No guarding, no rebound.  No organosplenomegaly was  appreciated.  RECTAL:  Normal sphincter tone.  Prostate was smooth and normal  in size  and contour with well-preserved sulcus without abnormality.  EXTREMITIES:  Without clubbing, cyanosis, or edema.  No deformities were  noted.  NEUROLOGIC:  Formal sensation testing was not performed, based on the  patient's report of normal sensation, which he checks routinely.   CHART REVIEW:  The last cardiac catheterization in 2004 with no  obstructive disease noted at that time.  Last stress Cardiolite study  from August 19, 2005 read out as showing a negative study with small  region of apical ischemia, unchanged from previous study.  No  colonoscopy is on the chart.  The last chest x-ray is from December 17, 2000  and unremarkable.   LABORATORY DATA:  Hemoglobin was 13.9 gm, hematocrit 38.8%.  Cholesterol  142, triglycerides 75, HDL 46.7, LDL was 80.  Serum glucose was 64 with  an A1C, however, of 8%.  Creatinine was 0.9.  Liver functions were  normal.  PSA was normal at 0.78.   ASSESSMENT AND PLAN:  1. Insulin-dependent diabetes.  The patient's control has slipped from      7.4% to 8% A1C.  He admits that his CBGs were running high.  He is      following a good diet and at this time will continue his present      medications with close home monitoring.  Will assess if he      continues to run elevated serum by CBG.  We did discuss the      possibility of an insulin pump, which he feels would be helpful,      but he is not yet ready.  I have told him to consider this and that      he should not wait, that if he feels committed to this would be      happy to refer him to Dr. Everardo All for pump consideration.  2. Coronary artery disease.  The patient is currently stable and doing      well.  He does report one episode of chest pain as noted, but      nothing on a regular basis.  His last Cardiolite as noted.  The      patient was formerly followed by Dr. Andee Lineman.  I do not believe he      has been reassigned to a cardiologist.  Would schedule this for him  at his  convenience.  3. Lipids.  The patient is right on goal with an LDL of 80.  He will      continue with his Zocor.  4. Hypothyroid disease.  Unfortunately, TSH was not included with the      patient's laboratory.  The last TSH dates from August 08, 2005 and      was well controlled with a free T4 of 0.6, but TSH was mildly      elevated at 9.08.  Plan - if the patient continues to have problems      with management of his insulin, would need to recheck his TSH to      see if he is slightly hypothyroid, contributing to his elevated      serum glucose.  5. Hemachromatosis.  The patient has routine hemoglobins; his last was      noted.  This is followed by the GI service, Dr. Russella Dar, with      phlebotomy as needed.  6. Orthopedics.  The patient is not undergoing any current therapy or      treatment.  He still has some limitation in his right shoulder and      has difficulty throwing a ball, but at this point, active treatment      is not under consideration.  Will defer this to the patient and Dr.      Ophelia Charter.  7. Health maintenance.  Would recommend the patient consider having a      colonoscopy.  Discussed this with him at his convenience, and will      schedule when he is ready.   SUMMARY:  This is a pleasant gentleman with medical problems outlined  above who actually is medically stable at this time.  He is asked to  return to see me in 6 months or on an as-needed basis.  He said he  wished to move forward with an insulin pump.  I would be happy to  facilitate this with an appointment with Dr. Everardo All.  Should he wish to  schedule a colonoscopy, I would be happy to facilitate this, as well,  with a referral to Dr. Russella Dar.     Rosalyn Gess Norins, MD  Electronically Signed    MEN/MedQ  DD: 09/11/2006  DT: 09/12/2006  Job #: [id number]   cc:   Kelvin Cellar

## 2010-10-22 NOTE — Cardiovascular Report (Signed)
NAME:  Logan [name], Logan [name]                          ACCOUNT NO.:  [id number]   MEDICAL RECORD NO.:  [id number]                   PATIENT TYPE:  INP   LOCATION:  2921                                 FACILITY:  [facility]   PHYSICIAN:  Charlton Haws, M.D. [facility]              DATE OF BIRTH:  [birth year]   DATE OF PROCEDURE:  08/21/2002  DATE OF DISCHARGE:  08/22/2002                              CARDIAC CATHETERIZATION   PROCEDURE:  Coronary arteriography.   INDICATIONS FOR PROCEDURE:  A 60 year old longstanding diabetic with  diaphoresis and left-sided arm pain.   DESCRIPTION OF PROCEDURE:  The patient ruled out for myocardial infarction.  Catheterization was done from the right femoral artery.   The left main coronary artery was normal.   The left anterior descending artery had a long area of 60% diffuse disease  in the mid and distal vessel.  The first diagonal branch had 70% ostial  disease.  The second diagonal branch had 70% ostial disease.   The circumflex coronary artery had 30% ostial disease, mid and distal vessel  were normal.   The right coronary artery was large and dominant.  There was 30% multiple  acute lesions in the proximal and mid portion.  There was 30-40% disease  distally.  The PDA had 30% multiple discrete lesions.   RAO VENTRICULOGRAPHY:  RAO ventriculography revealed apical wall hypokinesis  with an EF of 50 to 55%.  There is no gradient across the aortic valve with  no MR.  The aortic pressure was 170/89.  LV pressure was 170/18.   IMPRESSION:  The films were reviewed extensively with Dr. Geralynn Rile.  He  felt that continued medical therapy is warranted.  A review of his films  from 2002 showed no significant change in his current angiography.  Note  should also be made that the patient is quite active, hunting and working  outside.  Up until yesterday, he had no exertional chest pain.  Most of his  pains are nonexertional.  He is ruled out for myocardial  infarction.   He will need tight control of his risk factors including phlebotomy for his  hemachromatosis.  We will increase his Zocor from 20 mg to 40 mg, and I  suspect this can be titrated all the way up to 80 mg as an outpatient.   He will also need tight control of his blood sugar as well as frequent  monitoring of his hemoglobin A1c.   Even with all of this, I would suggest a followup Cardiolite study in six  months.                                               Charlton Haws, M.D. [facility]    PN/MEDQ  D:  08/21/2002  T:  08/22/2002  Job:  [id number]   cc:   Willa Rough, M.D. [facility]   Learta Codding, M.D. [facility]  [address]  [address]  [place]  [place] [address]   Rosalyn Gess. Norins, M.D. [facility]

## 2010-10-22 NOTE — Discharge Summary (Signed)
[place]. [facility]  Patient:    Logan [name], Logan [name]                       MRN: [id number] Adm. Date:  [id number] Disc. Date: [id number] Attending:  Ronaldo Miyamoto Dictator:   Dian Kenneth, P.A.C. [facility] CC:         Rosalyn Gess. Norins, M.D., [place], [facility], [place], [place]   Discharge Summary  CHIEF COMPLAINT: Mr. Sherrer is a 60 year old white male with no prior known cardiac history.  He is a nonsmoker and has a history of diabetes.  He presented with an acute anterior wall myocardial infarction at [facility] and was transferred here for emergent intervention.  HISTORY OF PRESENT ILLNESS: He was taken directly to the catheterization laboratory by Dr. Chales Abrahams, and found to have a totally occluded mid LAD that was dilated to less than 10% without complications.  A stent was not required.  He was given Reopro x 12 hours.  He was transferred to the CCU and ultimately to the regular Floor, where he was ambulated without symptoms.  He did have residual 70% ostial diagonals and a 60% distal right.  Ejection fraction was preserved at 55%.  ACCESSORY CLINICAL FINDINGS: Electrolytes and renal function were totally normal.  Hemoglobin 12.6, hematocrit 36.1.  Total cholesterol 155, HDL 38, LDL 103.  Ferritin level was normal at 259, total iron binding capacity 209, serum iron normal at 89.  Lipid profile revealed a total cholesterol of 155, triglyceride 72.  CK-MB peaked at 2631, with MB 125.7.  FINAL DIAGNOSES:  1. Acute anterior wall myocardial infarction, treated with emergent left     anterior descending angioplasty without complications.  2. Residual disease in the first diagonal, second diagonal, distal right     coronary artery.  3. History of diabetes.  4. Controlled hypertension.  DISPOSITION: The patient is discharged home.  DISCHARGE MEDICATIONS:  1. Insulin as prior to admission.  2. Enteric-coated aspirin 1 q.d.  3.  Toprol XL 50 mg q.d.  4. Altace 5 mg q.d.  5. Nitroglycerin p.r.n.  FOLLOW-UP: He will follow up with Dr. Andee Lineman in a couple of weeks and with Dr. Debby Bud long-term. DD:  04/16/00 TD:  04/16/00 Job: [id number]

## 2010-10-22 NOTE — Discharge Summary (Signed)
[place]  Patient:    Logan [name], Logan [name]                         MRN: [id number] Adm. Date:  12/16/00 Disc. Date: 12/18/00 Attending:  Daisey Must, M.D. [facility] Dictator:   Gene Serpe, P.A. CC:         Rosalyn Gess. Norins, M.D. [facility]   Discharge Summary  PROCEDURE:  Cardiac catheterization on December 18, 2000.  REASON FOR ADMISSION:  Logan [name] is a 60 year old male, status post anterior myocardial infarction, PTCA of the LAD in November 2001, who presented to the emergency room with a complaint of left-sided chest discomfort associated with left arm discomfort and tingling of the fingers.  He denied any associated dyspnea, diaphoresis, or nausea.  Of note, he denied any correlation with exertion, stating that the episodes typically occur at rest; however, he noted some similarity to his previous MI, but less intensity.  He was admitted for a rule out of a myocardial infarction and further diagnostic evaluation.  LABORATORY DATA:  Cardiac enzymes:  Peak CPK 326/10.3.  Troponin I 0.02, 0.01. Lipid profile:  Total cholesterol 126, HDL 47, LDL 67, total cholesterol/HDL ratio 2.7.  Sodium 137, potassium 3.6, glucose 238, BUN 12, creatinine 1.0. Mildly elevated ALT 124, otherwise normal liver enzymes.  WBC 4.7, hemoglobin 14, hematocrit 40, platelets 188 on admission.  Admission chest x-ray:  NAD.  HOSPITAL COURSE:  The patient was admitted for the management of unstable angina pectoris.  He was placed on intravenous heparin and nitroglycerin.  The recommendation was to proceed with a relook coronary angiogram.  The cardiac catheterization performed on December 18, 2000, by Dr. Loraine Leriche Pulsipher. (See the cardiac catheterization for full details.)  This revealed moderate two-vessel coronary artery disease with preserved LV function, with an ejection fraction of 56%.  Specifically, there was a 50% mid, 60% distal LAD, a 60%-70% ostial diagonal-I, 60%-70% ostial  diagonal-II, a 30% ostial circumflex, a 30% proximal, a 50% mid, 60% distal RCA, and a 40% PDA.  Dr. Gerri Spore recommended an outpatient exercise rest/stress Cardiolite test in our office in one week.  There were medication adjustments made during this brief hospital stay.  DISCHARGE MEDICATIONS: 1. Insulin 70/30, 18 units q.a.m., 7 units q.p.m. 2. Toprol XL 50 mg q.d. 3. Altace 5 mg q.d. 4. Zocor 20 mg q.h.s. 5. Aspirin 325 mg q.d. 6. Hydrochlorothiazide 12.5 mg q.d. 7. Nitrostat as directed.  INSTRUCTIONS:  Refrain from any heavy lifting or driving x 2 days.  To call the office if there is any bleeding or swelling of the groin.  DIET:  To maintain a low-fat, low-cholesterol diet.  FOLLOWUP:  The patient is scheduled for an exercise Cardiolyte test on Friday, December 29, 2000, at 9 a.m.  The patient will then follow up with Dr. Lewayne Bunting on Wednesday, January 03, 2001, at 10:15 a.m.  DISCHARGE DIAGNOSES: 1. Recurrent chest discomfort:    a. Cardiac catheterization on December 18, 2000:  Moderate two-vesesl       coronary artery disease, preserved left ventricular function.    b. Medical therapy recommended, with outpatient exercise Cardiolyte in       one week.    c. Status post anterior myocardial infarction/percutaneous transluminal       coronary angioplasty of the left anterior descending coronary artery       in November 2001. 2. Diabetes mellitus, insulin-requiring. 3. Dyslipidemia. 4. History of hypertension. 5.  History of hemochromatosis. DD:  12/18/00 TD:  12/18/00 Job: [id number]

## 2010-10-22 NOTE — Cardiovascular Report (Signed)
[place]. [facility]  Patient:    Logan [name], Logan [name]                       MRN: [id number] Proc. Date: 12/18/00 Adm. Date:  [id number] Attending:  Daisey Must CC:         Rosalyn Gess. Norins, M.D. [facility]  Lewayne Bunting, M.D.  Cardiac Catheterization Lab   Cardiac Catheterization  PROCEDURES PERFORMED: 1. Left heart catheterization. 2. Coronary angiography. 3. Left ventriculography.  INDICATIONS:  Logan [name] is a 60 year old male with a history of previous anterior wall myocardial infarction, treated with angioplasty of the left anterior descending artery in November 2001.  He presented to the hospital with recurrent substernal chest pain and had borderline enzyme elevation.  He was thus referred for cardiac catheterization.  PROCEDURAL NOTE:  A 6-French sheath was placed in the right femoral artery. Standard Judkins 6-French catheters were utilized.  Contrast was Omnipaque. There were no complications.  RESULTS:  HEMODYNAMICS: 1. Left ventricular pressure:  138/18. 2. Aortic pressure:  138/70. There is no aortic valve gradient.  LEFT VENTRICULOGRAM:  There is moderate akinesis of the apical wall, otherwise normal wall motion.  Ejection fraction is calculated at 56%.  There is no mitral regurgitation.  CORONARY ARTERIOGRAPHY:  (Right dominant) 1. LEFT MAIN:  Had a proximal 20% stenosis. 2. LEFT ANTERIOR DESCENDING ARTERY:  Has a tubular 50% stenosis in the    mid vessel, beginning just after the first diagonal branch.  Following    this is a long 60% stenosis, extending across the origin of the    second diagonal branch.  The first diagonal is large and had a 60-70%    stenosis at its origin.  The second diagonal branch is also large and    has a 60-70% stenosis at its origin. 3. LEFT CIRCUMFLEX:  Has a 30% stenosis at its ostium.  It gives rise to a    small ramus intermediate and normal-sized first obtuse marginal. 4. RIGHT CORONARY ARTERY:  Has a  30% stenosis in the proximal vessel, discrete    20% stenosis in the mid vessel; followed by tubular 60% stenosis extending    from the mid to the distal vessel across the acute margin.  Further down    in the distal vessel is a diffuse 20% stenosis, before the origin of the    posterior descending artery.  In the posterior descending artery itself, it    is large in size and has a 40% stenosis in the mid vessel.  There are two    small posterolateral branches.  IMPRESSIONS: 1. Left ventricular systolic function in the lower range of normal, with    moderate apical akinesis. 2. Moderate two-vessel coronary artery disease of borderline severity.  PLAN:  A stress Cardiolite will be obtained to assess for ischemia in the anterior wall.  If it does show ischemia in the anterior wall, then we will need to consider coronary bypass surgery versus percutaneous intervention. DD:  12/18/00 TD:  12/18/00 Job: [id number]

## 2011-05-10 ENCOUNTER — Ambulatory Visit (INDEPENDENT_AMBULATORY_CARE_PROVIDER_SITE_OTHER): Payer: PRIVATE HEALTH INSURANCE | Admitting: Internal Medicine

## 2011-05-10 ENCOUNTER — Encounter: Payer: Self-pay | Admitting: Internal Medicine

## 2011-05-10 ENCOUNTER — Other Ambulatory Visit (INDEPENDENT_AMBULATORY_CARE_PROVIDER_SITE_OTHER): Payer: PRIVATE HEALTH INSURANCE

## 2011-05-10 VITALS — BP 148/84 | HR 72 | Temp 98.4°F | Resp 16 | Ht 74.0 in | Wt 183.0 lb

## 2011-05-10 DIAGNOSIS — E109 Type 1 diabetes mellitus without complications: Secondary | ICD-10-CM

## 2011-05-10 DIAGNOSIS — I10 Essential (primary) hypertension: Secondary | ICD-10-CM

## 2011-05-10 DIAGNOSIS — I2510 Atherosclerotic heart disease of native coronary artery without angina pectoris: Secondary | ICD-10-CM

## 2011-05-10 DIAGNOSIS — Z23 Encounter for immunization: Secondary | ICD-10-CM

## 2011-05-10 DIAGNOSIS — E7800 Pure hypercholesterolemia, unspecified: Secondary | ICD-10-CM

## 2011-05-10 LAB — HEPATIC FUNCTION PANEL
ALT: 26 U/L (ref 0–53)
AST: 22 U/L (ref 0–37)
Alkaline Phosphatase: 118 U/L — ABNORMAL HIGH (ref 39–117)
Bilirubin, Direct: 0.1 mg/dL (ref 0.0–0.3)
Total Bilirubin: 0.7 mg/dL (ref 0.3–1.2)

## 2011-05-10 LAB — COMPREHENSIVE METABOLIC PANEL
Alkaline Phosphatase: 118 U/L — ABNORMAL HIGH (ref 39–117)
BUN: 20 mg/dL (ref 6–23)
CO2: 29 mEq/L (ref 19–32)
Creatinine, Ser: 1 mg/dL (ref 0.4–1.5)
GFR: 81.88 mL/min (ref >60.00)
Glucose, Bld: 115 mg/dL — ABNORMAL HIGH (ref 70–99)
Sodium: 140 mEq/L (ref 135–145)
Total Bilirubin: 0.7 mg/dL (ref 0.3–1.2)

## 2011-05-10 LAB — IBC PANEL
Iron: 35 ug/dL — ABNORMAL LOW (ref 42–165)
Saturation Ratios: 11 % — ABNORMAL LOW (ref 20.0–50.0)
Transferrin: 228.3 mg/dL (ref 212.0–360.0)

## 2011-05-10 LAB — CBC WITH DIFFERENTIAL/PLATELET
Basophils Relative: 0.6 % (ref 0.0–3.0)
Eosinophils Relative: 5.5 % — ABNORMAL HIGH (ref 0.0–5.0)
HCT: 40.8 % (ref 39.0–52.0)
Lymphs Abs: 1.9 10*3/uL (ref 0.7–4.0)
MCV: 85.7 fl (ref 78.0–100.0)
Monocytes Absolute: 0.6 10*3/uL (ref 0.1–1.0)
Monocytes Relative: 9.1 % (ref 3.0–12.0)
Platelets: 242 10*3/uL (ref 150.0–400.0)
RBC: 4.76 Mil/uL (ref 4.22–5.81)
WBC: 6.9 10*3/uL (ref 4.5–10.5)

## 2011-05-10 LAB — LIPID PANEL
Cholesterol: 159 mg/dL (ref 0–200)
HDL: 44.8 mg/dL (ref >39.00)
Total CHOL/HDL Ratio: 4
Triglycerides: 67 mg/dL (ref 0.0–149.0)
VLDL: 13.4 mg/dL (ref 0.0–40.0)

## 2011-05-10 MED ORDER — LEVOTHYROXINE SODIUM 75 MCG PO TABS: 75.0000 ug | ORAL_TABLET | Freq: Every day | ORAL | Status: DC

## 2011-05-10 MED ORDER — METOPROLOL TARTRATE 50 MG PO TABS: 75.0000 mg | ORAL_TABLET | Freq: Every day | ORAL | Status: DC

## 2011-05-10 MED ORDER — AMLODIPINE BESYLATE 10 MG PO TABS: 10.0000 mg | ORAL_TABLET | Freq: Every day | ORAL | Status: DC

## 2011-05-10 MED ORDER — SIMVASTATIN 20 MG PO TABS: 20.0000 mg | ORAL_TABLET | Freq: Every day | ORAL | Status: DC

## 2011-05-10 MED ORDER — ALISKIREN-VALSARTAN 300-320 MG PO TABS: 1.0000 | ORAL_TABLET | Freq: Every day | ORAL | Status: DC

## 2011-05-10 MED ORDER — CLONIDINE HCL 0.1 MG PO TABS: 0.1000 mg | ORAL_TABLET | Freq: Every day | ORAL | Status: DC | PRN

## 2011-05-10 NOTE — Patient Instructions (Signed)
elevated blood pressures most likely due to not taking all the medications. Plan - resume all previous medications and monitor BP  Diabetes - for an A1C today with recommendations to follow  Cholesterol - for lab  Heart - seems to be stable  Hemachromatosis - for CBC and iron levels today.

## 2011-05-10 NOTE — Progress Notes (Signed)
  Subjective:    Patient ID: Logan [name], male    DOB: [birth year], 60 y.o.   MRN: [id number]  HPI Mr. Colina presents for follow-up. He was at the [facility] - rode a lot of rides and afterward felt light-headed and his BP was elevated 185/90+. He continued to have elevated BP. Of note: he has been out of some of his hypertensive meds.  He reports blood sugars have been ok - lots of variability. He is conscientious about taking his medication and eating on a regular schedule  I have reviewed the patient's medical history in detail and updated the computerized patient record.    Review of Systems System review is negative for any constitutional, cardiac, pulmonary, GI or neuro symptoms or complaints other than as described in the HPI.     Objective:   Physical Exam Vitals noted Gen'l - tall, white male in no distress Cor - RRR Pulm- normal respirations.  Lab Results  Component Value Date   WBC 6.9 05/10/2011   HGB 13.5 05/10/2011   HCT 40.8 05/10/2011   PLT 242.0 05/10/2011   GLUCOSE 115* 05/10/2011   CHOL 159 05/10/2011   TRIG 67.0 05/10/2011   HDL 44.80 05/10/2011   LDLCALC 101* 05/10/2011   ALT 26 05/10/2011   ALT 26 05/10/2011   AST 22 05/10/2011   AST 22 05/10/2011   NA 140 05/10/2011   K 3.7 05/10/2011   CL 104 05/10/2011   CREATININE 1.0 05/10/2011   BUN 20 05/10/2011   CO2 29 05/10/2011   PSA 0.78 07/31/2006   HGBA1C 8.7* 05/10/2011          Assessment & Plan:

## 2011-05-12 NOTE — Assessment & Plan Note (Signed)
No chest pain, including no pain with very vigorous activity. He did have chest pressure pain with previous MI  Plan - continued risk management.

## 2011-05-12 NOTE — Assessment & Plan Note (Signed)
Continued phlebotomy as needed. For a phlebotomy panel today.

## 2011-05-12 NOTE — Assessment & Plan Note (Signed)
Lab Results  Component Value Date   CHOL 159 05/10/2011   HDL 44.80 05/10/2011   LDLCALC 101* 05/10/2011   TRIG 67.0 05/10/2011   CHOLHDL 4 05/10/2011   OK control

## 2011-05-12 NOTE — Assessment & Plan Note (Signed)
BP Readings from Last 3 Encounters:  05/10/11 148/84  03/11/10 158/70  12/24/09 139/81   Out of office readings have been much higher.  Plan- take all current medications on a regular basis. If BP remains elevated, 140+/90+, will need further adjustment of medications.

## 2011-05-12 NOTE — Assessment & Plan Note (Signed)
Lab Results  Component Value Date   HGBA1C 8.7* 05/10/2011   Poor control. Mr. Mangione knows how to manage his diabetes. He will need to consider adjusting his insulins: titrating the lantus so that his fasting CBG is consistently less than 130 and he can adjust his before meal insulin so that his 90 minute post-prandial CBG is less than 200.  Repeat A1C in 3 months

## 2011-10-13 ENCOUNTER — Other Ambulatory Visit: Payer: PRIVATE HEALTH INSURANCE

## 2011-11-02 ENCOUNTER — Telehealth: Payer: Self-pay

## 2011-11-02 NOTE — Telephone Encounter (Signed)
Patient walked into the lab today to have a phlebotomy.  The patient hasn't been seen in the office in several years and has not come for phlebotomy since April of 2012.  The patient was very argumentative and upset that there were no orders in the system for phlebotomy. I explained that he has not been here in over a year and needs to be more consistent with his phlebotomy.  His last CBC was done with Dr. Debby Bud in 12/12.  Do we need to see him in the office or is it ok to set up phlebotomy? If ok to set up phlebotomy directly please advise frequency.

## 2011-11-10 ENCOUNTER — Other Ambulatory Visit: Payer: Self-pay | Admitting: Internal Medicine

## 2011-11-10 ENCOUNTER — Other Ambulatory Visit (INDEPENDENT_AMBULATORY_CARE_PROVIDER_SITE_OTHER): Payer: PRIVATE HEALTH INSURANCE

## 2011-11-10 DIAGNOSIS — E109 Type 1 diabetes mellitus without complications: Secondary | ICD-10-CM

## 2011-11-10 LAB — CBC WITH DIFFERENTIAL/PLATELET
Basophils Absolute: 0 10*3/uL (ref 0.0–0.1)
Eosinophils Absolute: 0.3 10*3/uL (ref 0.0–0.7)
HCT: 45.7 % (ref 39.0–52.0)
Hemoglobin: 15 g/dL (ref 13.0–17.0)
Lymphs Abs: 1.9 10*3/uL (ref 0.7–4.0)
MCHC: 32.8 g/dL (ref 30.0–36.0)
Monocytes Absolute: 0.7 10*3/uL (ref 0.1–1.0)
Neutro Abs: 3.9 10*3/uL (ref 1.4–7.7)
RDW: 15.2 % — ABNORMAL HIGH (ref 11.5–14.6)

## 2011-11-11 ENCOUNTER — Encounter: Payer: Self-pay | Admitting: Internal Medicine

## 2011-11-11 NOTE — Telephone Encounter (Signed)
New labs ordered by Dr Debby Bud.

## 2012-05-21 ENCOUNTER — Other Ambulatory Visit (INDEPENDENT_AMBULATORY_CARE_PROVIDER_SITE_OTHER): Payer: PRIVATE HEALTH INSURANCE

## 2012-05-21 ENCOUNTER — Encounter: Payer: Self-pay | Admitting: Internal Medicine

## 2012-05-21 ENCOUNTER — Ambulatory Visit (INDEPENDENT_AMBULATORY_CARE_PROVIDER_SITE_OTHER): Payer: PRIVATE HEALTH INSURANCE | Admitting: Internal Medicine

## 2012-05-21 VITALS — BP 132/68 | HR 63 | Temp 98.0°F | Resp 10 | Wt 179.1 lb

## 2012-05-21 DIAGNOSIS — E7800 Pure hypercholesterolemia, unspecified: Secondary | ICD-10-CM

## 2012-05-21 DIAGNOSIS — M79641 Pain in right hand: Secondary | ICD-10-CM

## 2012-05-21 DIAGNOSIS — E109 Type 1 diabetes mellitus without complications: Secondary | ICD-10-CM

## 2012-05-21 DIAGNOSIS — I10 Essential (primary) hypertension: Secondary | ICD-10-CM

## 2012-05-21 DIAGNOSIS — M79609 Pain in unspecified limb: Secondary | ICD-10-CM

## 2012-05-21 LAB — HEPATIC FUNCTION PANEL
ALT: 29 U/L (ref 0–53)
AST: 23 U/L (ref 0–37)
Alkaline Phosphatase: 159 U/L — ABNORMAL HIGH (ref 39–117)
Bilirubin, Direct: 0.2 mg/dL (ref 0.0–0.3)
Total Bilirubin: 1 mg/dL (ref 0.3–1.2)

## 2012-05-21 LAB — LIPID PANEL
HDL: 40.5 mg/dL (ref >39.00)
LDL Cholesterol: 70 mg/dL (ref 0–99)
Total CHOL/HDL Ratio: 3

## 2012-05-21 LAB — COMPREHENSIVE METABOLIC PANEL
ALT: 29 U/L (ref 0–53)
AST: 23 U/L (ref 0–37)
CO2: 32 mEq/L (ref 19–32)
Chloride: 101 mEq/L (ref 96–112)
GFR: 92.27 mL/min (ref >60.00)
Sodium: 140 mEq/L (ref 135–145)
Total Bilirubin: 1 mg/dL (ref 0.3–1.2)
Total Protein: 7 g/dL (ref 6.0–8.3)

## 2012-05-21 LAB — CBC WITH DIFFERENTIAL/PLATELET
Basophils Relative: 0.3 % (ref 0.0–3.0)
Eosinophils Absolute: 0.2 10*3/uL (ref 0.0–0.7)
Eosinophils Relative: 2.7 % (ref 0.0–5.0)
Hemoglobin: 15.4 g/dL (ref 13.0–17.0)
MCHC: 34.2 g/dL (ref 30.0–36.0)
MCV: 90.7 fl (ref 78.0–100.0)
Monocytes Absolute: 0.9 10*3/uL (ref 0.1–1.0)
Neutro Abs: 6.3 10*3/uL (ref 1.4–7.7)
RBC: 4.95 Mil/uL (ref 4.22–5.81)

## 2012-05-21 MED ORDER — SIMVASTATIN 20 MG PO TABS: 20.0000 mg | ORAL_TABLET | Freq: Every day | ORAL | Status: DC

## 2012-05-21 MED ORDER — CLONIDINE HCL 0.1 MG PO TABS: 0.1000 mg | ORAL_TABLET | Freq: Every day | ORAL | Status: DC | PRN

## 2012-05-21 MED ORDER — METOPROLOL TARTRATE 50 MG PO TABS: 75.0000 mg | ORAL_TABLET | Freq: Every day | ORAL | Status: DC

## 2012-05-21 MED ORDER — LOSARTAN POTASSIUM 100 MG PO TABS: 100.0000 mg | ORAL_TABLET | Freq: Every day | ORAL | Status: DC

## 2012-05-21 MED ORDER — LEVOTHYROXINE SODIUM 75 MCG PO TABS: 75.0000 ug | ORAL_TABLET | Freq: Every day | ORAL | Status: DC

## 2012-05-21 NOTE — Patient Instructions (Addendum)
Thanks for coming in.   Refills done.  See you next month and have a Happy [address]

## 2012-05-22 ENCOUNTER — Other Ambulatory Visit: Payer: Self-pay | Admitting: Internal Medicine

## 2012-05-22 MED ORDER — AMLODIPINE BESYLATE 10 MG PO TABS: 10.0000 mg | ORAL_TABLET | Freq: Every day | ORAL | Status: DC

## 2012-05-22 NOTE — Telephone Encounter (Signed)
Pt req a written prescription for Amlodipine 10mg . Pt was here for ov 05/21/12. Please call pt once its done, pt does not wants it to be call in, pt perfer written prescription.

## 2012-05-23 ENCOUNTER — Encounter: Payer: Self-pay | Admitting: Internal Medicine

## 2012-05-23 NOTE — Progress Notes (Signed)
  Subjective:    Patient ID: Logan [name], male    DOB: 1951/02/21, 61 y.o.   MRN: [id number]  HPI Logan [name] presents for medication check and for evaluation of joint pain, especially the hand. The index MCP right hand will swell and become very painful. He has been doing well in all regards except for chronic issues with glycemic control. He denies chest pain, decreased exercise tolerance, DOE. He has remained very active. He reports his previous problems with shoulder pain have resolved.   PMH, FamHx and SocHx reviewed for any changes and relevance. Current Outpatient Prescriptions on File Prior to Visit  Medication Sig Dispense Refill  . aspirin 81 MG tablet Take 81 mg by mouth 2 (two) times daily.       . cloNIDine (CATAPRES) 0.1 MG tablet Take 1 tablet (0.1 mg total) by mouth daily as needed. If BP is over 180  60 tablet  11  . glucose blood (FREESTYLE TEST STRIPS) test strip 1 each by Other route as needed. Use as instructed       . insulin glargine (LANTUS) 100 UNIT/ML injection Inject 12 Units into the skin at bedtime.        . Insulin Isophane & Regular (HUMULIN 70/30 [place]) Inject 32 Units into the skin as directed.        Marland Kitchen levothyroxine (SYNTHROID, LEVOTHROID) 75 MCG tablet Take 1 tablet (75 mcg total) by mouth daily.  90 tablet  3  . metoprolol (LOPRESSOR) 50 MG tablet Take 1.5 tablets (75 mg total) by mouth daily.  135 tablet  3  . NEEDLE, DISP, 30 G (BD DISP NEEDLES) 30G X 1/2" MISC by Does not apply route. As directed       . simvastatin (ZOCOR) 20 MG tablet Take 1 tablet (20 mg total) by mouth at bedtime.  90 tablet  3  . amLODipine (NORVASC) 10 MG tablet Take 1 tablet (10 mg total) by mouth daily.  90 tablet  3  . losartan (COZAAR) 100 MG tablet Take 1 tablet (100 mg total) by mouth daily.  90 tablet  3      Review of Systems System review is negative for any constitutional, cardiac, pulmonary, GI or neuro symptoms or complaints other than as described in the HPI.      Objective:   Physical Exam Filed Vitals:   05/21/12 1454  BP: 132/68  Pulse: 63  Temp: 98 F (36.7 C)  Resp: 10   Gen'l - tall and lanky white man in no distress Cor- RRR Pulm - normal MSK - no deformity of the small joints hand, no erythema, no synovial thickening Neuor - A&O x 3       Assessment & Plan:  Hand pain - history and exam most c/w OA mild  Plan Heat, continued use, NSAIDs as needed.

## 2012-07-02 ENCOUNTER — Encounter: Payer: Self-pay | Admitting: Internal Medicine

## 2012-07-02 ENCOUNTER — Ambulatory Visit (INDEPENDENT_AMBULATORY_CARE_PROVIDER_SITE_OTHER): Payer: BC Managed Care – PPO | Admitting: Internal Medicine

## 2012-07-02 VITALS — BP 124/68 | HR 58 | Temp 97.6°F | Resp 10 | Ht 73.0 in | Wt 181.1 lb

## 2012-07-02 DIAGNOSIS — I10 Essential (primary) hypertension: Secondary | ICD-10-CM

## 2012-07-02 DIAGNOSIS — Z0000 Encounter for general adult medical examination without abnormal findings: Secondary | ICD-10-CM

## 2012-07-02 DIAGNOSIS — E109 Type 1 diabetes mellitus without complications: Secondary | ICD-10-CM

## 2012-07-02 DIAGNOSIS — E7800 Pure hypercholesterolemia, unspecified: Secondary | ICD-10-CM

## 2012-07-02 NOTE — Progress Notes (Signed)
Subjective:    Patient ID: Logan [name], male    DOB: July 27, 1950, 62 y.o.   MRN: [id number]  HPI The patient is here for annual wellness examination and management of other chronic and acute problems. He has been doing well with no exacerbations of his chronic.   The risk factors are reflected in the social history.  The roster of all physicians providing medical care to patient - is listed in the Snapshot section of the chart.  Activities of daily living:  The patient is 100% inedpendent in all ADLs: dressing, toileting, feeding as well as independent mobility  Home safety : The patient has smoke detectors in the home. Fall - no falls. Home is fall safe. They wear seatbelts.  firearms are present in the home, kept in a safe fashion. There is no violence in the home.   There is no risks for hepatitis, STDs or HIV. There is no   history of blood transfusion. They have no travel history to infectious disease endemic areas of the world.  The patient has full dentures and doesn't see dentist. They have seen their eye doctor in the last year. They deny any hearing difficulty and have not had audiologic testing in the last year.    They do not  have excessive sun exposure. Discussed the need for sun protection: hats, long sleeves and use of sunscreen if there is significant sun exposure.   Diet: the importance of a healthy diet is discussed. They do have a "sorry" diet.   The patient has no regular exercise program.  The benefits of regular aerobic exercise were discussed.  Depression screen: there are no signs or vegative symptoms of depression- irritability, change in appetite, anhedonia, sadness/tearfullness.  Cognitive assessment: the patient manages all their financial and personal affairs and is actively engaged.    The following portions of the patient's history were reviewed and updated as appropriate: allergies, current medications, past family history, past medical history,  past  surgical history, past social history  and problem list.  Past Medical History  Diagnosis Date  . Hypertension   . Diabetes mellitus   . Hearing loss   . CAD (coronary artery disease)   . Hemochromatosis   . Hyperlipidemia   . Diabetic retinopathy(362.0)    Past Surgical History  Procedure Date  . Inner ear surgery     right   Family History  Problem Relation Age of Onset  . Asthma Mother   . Heart failure Mother   . Emphysema Mother   . Alcohol abuse Father   . Cancer Father     prostate  . Stroke Father   . Heart disease Neg Hx   . Colon cancer Neg Hx    History   Social History  . Marital Status: Widowed    Spouse Name: N/A    Number of Children: 1  . Years of Education: 12   Occupational History  . retired    Social History Main Topics  . Smoking status: Never Smoker   . Smokeless tobacco: Never Used  . Alcohol Use: No  . Drug Use: No  . Sexually Active: Yes -- Male partner(s)   Other Topics Concern  . Not on file   Social History Narrative   HSG. Married - '77 - '04. 1 son '80. Work - semi-retired. Has a girlfriend.    Current Outpatient Prescriptions on File Prior to Visit  Medication Sig Dispense Refill  . amLODipine (NORVASC) 10 MG tablet  Take 1 tablet (10 mg total) by mouth daily.  90 tablet  3  . aspirin 81 MG tablet Take 81 mg by mouth 2 (two) times daily.       . cloNIDine (CATAPRES) 0.1 MG tablet Take 1 tablet (0.1 mg total) by mouth daily as needed. If BP is over 180  60 tablet  11  . glucose blood (FREESTYLE TEST STRIPS) test strip 1 each by Other route as needed. Use as instructed       . insulin glargine (LANTUS) 100 UNIT/ML injection Inject 12 Units into the skin at bedtime.        . Insulin Isophane & Regular (HUMULIN 70/30 [place]) Inject 32 Units into the skin as directed.        Marland Kitchen levothyroxine (SYNTHROID, LEVOTHROID) 75 MCG tablet Take 1 tablet (75 mcg total) by mouth daily.  90 tablet  3  . losartan (COZAAR) 100 MG tablet Take 1  tablet (100 mg total) by mouth daily.  90 tablet  3  . metoprolol (LOPRESSOR) 50 MG tablet Take 1.5 tablets (75 mg total) by mouth daily.  135 tablet  3  . NEEDLE, DISP, 30 G (BD DISP NEEDLES) 30G X 1/2" MISC by Does not apply route. As directed       . simvastatin (ZOCOR) 20 MG tablet Take 1 tablet (20 mg total) by mouth at bedtime.  90 tablet  3     Vision, hearing, body mass index were assessed and reviewed.   During the course of the visit the patient was educated and counseled about appropriate screening and preventive services including : fall prevention , diabetes screening, nutrition counseling, colorectal cancer screening, and recommended immunizations.    Review of Systems Constitutional:  Negative for fever, chills, activity change and unexpected weight change.  HEENT:  Negative for hearing loss, ear pain, congestion, neck stiffness and postnasal drip. Negative for sore throat or swallowing problems. Negative for dental complaints.   Eyes: Negative for vision loss or change in visual acuity.  Respiratory: Negative for chest tightness and wheezing. Negative for DOE.   Cardiovascular: Negative for chest pain or palpitations. No decreased exercise tolerance Gastrointestinal: No change in bowel habit. No bloating or gas. No reflux or indigestion Genitourinary: Negative for urgency, frequency, flank pain and difficulty urinating.  Musculoskeletal: Negative for myalgias, back pain, arthralgias and gait problem.  Neurological: Negative for dizziness, tremors, weakness and headaches.  Hematological: Negative for adenopathy.  Psychiatric/Behavioral: Negative for behavioral problems and dysphoric mood.       Objective:   Physical Exam Filed Vitals:   07/02/12 1425  BP: 124/68  Pulse: 58  Temp: 97.6 F (36.4 C)  Resp: 10   Wt Readings from Last 3 Encounters:  07/02/12 181 lb 1.9 oz (82.155 kg)  05/21/12 179 lb 1.3 oz (81.23 kg)  05/10/11 183 lb (83.008 kg)   Gen'l: Well  nourished well developed white male in no acute distress  HEENT: Head: Normocephalic and atraumatic. Right Ear: External ear normal. EAC/TM nl. Left Ear: External ear normal.  EAC/TM nl. Nose: Nose normal. Mouth/Throat: Oropharynx is clear and moist. Dentition - native, in good repair. No buccal or palatal lesions. Posterior pharynx clear. Eyes: Conjunctivae and sclera clear. EOM intact. Pupils are equal, round, and reactive to light. Right eye exhibits no discharge. Left eye exhibits no discharge. Neck: Normal range of motion. Neck supple. No JVD present. No tracheal deviation present. No thyromegaly present.  Cardiovascular: Normal rate, regular rhythm, no gallop, no  friction rub, no murmur heard.      Quiet precordium. 2+ radial and DP pulses . No carotid bruits Pulmonary/Chest: Effort normal. No respiratory distress or increased WOB, no wheezes, no rales. No chest wall deformity or CVAT. Abdomen: Soft. Bowel sounds are normal in all quadrants. He exhibits no distension, no tenderness, no rebound or guarding, No heptosplenomegaly  Genitourinary:   Musculoskeletal: Normal range of motion. He exhibits no edema and no tenderness.       Small and large joints without redness, synovial thickening or deformity. Full range of motion preserved about all small, median and large joints.  Lymphadenopathy:    He has no cervical or supraclavicular adenopathy.  Neurological: He is alert and oriented to person, place, and time. CN II-XII intact. DTRs 2+ and symmetrical biceps, radial and patellar tendons. Cerebellar function normal with no. normsal sensation to light touch and pin prick. Decrease vibration. tremor, rigidity, normal gait and station.  Skin: Skin is warm and dry. No rash noted. No erythema.  Psychiatric: He has a normal mood and affect. His behavior is normal. Thought content normal.          Assessment & Plan:

## 2012-07-02 NOTE — Patient Instructions (Addendum)
Good to see you.  Exam is normal except for decreased vibratory sensation at the toes  Lab in December was ok except for A1C 9.8%!!!!! Need to work on this. If the AM sugar is less than 120 on a regular basis no change in Lantus. Need to consider changing from 70/30 to short acting humalog before meals. We will discuss this after your next A1C in April.  Return for lab in April - recheck A1C

## 2012-07-03 DIAGNOSIS — Z0000 Encounter for general adult medical examination without abnormal findings: Secondary | ICD-10-CM | POA: Insufficient documentation

## 2012-07-03 NOTE — Assessment & Plan Note (Signed)
Lab in December was ok except for A1C 9.8%!!!!! Need to work on this. If the AM sugar is less than 120 on a regular basis no change in Lantus. Need to consider changing from 70/30 to short acting humalog before meals. We will discuss this after your next A1C in April.

## 2012-07-03 NOTE — Assessment & Plan Note (Signed)
BP Readings from Last 3 Encounters:  07/02/12 124/68  05/21/12 132/68  05/10/11 148/84   Good control on present medications.

## 2012-07-03 NOTE — Assessment & Plan Note (Signed)
Recently stable w/o phlebotomy.  Plan - f/u CBC

## 2012-07-03 NOTE — Assessment & Plan Note (Signed)
Last LDL 70 - great control.  Plan - continue present dose.

## 2012-07-03 NOTE — Assessment & Plan Note (Signed)
Interval history - no major illness, injury or surgery in the last year. Physical exam is normal. Overdue for health maintenance: colonoscopy and immunizations. Will check paper records.  In summary - a very interesting man who looks healthy but has out of control diabetes. His risk factor modification in regard to CAD is good except for DM. He will return for follow up lab in April with recommendations to follow. He will need basic immunizations.

## 2012-10-30 ENCOUNTER — Other Ambulatory Visit: Payer: Self-pay | Admitting: Internal Medicine

## 2012-10-31 ENCOUNTER — Telehealth: Payer: Self-pay | Admitting: Internal Medicine

## 2012-10-31 MED ORDER — INSULIN GLARGINE 100 UNIT/ML ~~LOC~~ SOLN: 12.0000 [IU] | Freq: Every day | SUBCUTANEOUS | Status: DC

## 2012-10-31 NOTE — Telephone Encounter (Signed)
done

## 2012-10-31 NOTE — Telephone Encounter (Signed)
The patient called the triage line and is hoping to get a refill on his lantus rx [place]eld into the [facility] on [address] [facility].

## 2013-01-10 LAB — HM DIABETES EYE EXAM

## 2013-03-11 ENCOUNTER — Ambulatory Visit (INDEPENDENT_AMBULATORY_CARE_PROVIDER_SITE_OTHER): Payer: BC Managed Care – PPO | Admitting: Internal Medicine

## 2013-03-11 ENCOUNTER — Encounter: Payer: Self-pay | Admitting: Internal Medicine

## 2013-03-11 ENCOUNTER — Encounter (HOSPITAL_COMMUNITY): Payer: Self-pay | Admitting: Surgery

## 2013-03-11 ENCOUNTER — Inpatient Hospital Stay (HOSPITAL_COMMUNITY): Payer: BC Managed Care – PPO

## 2013-03-11 VITALS — BP 150/58 | HR 67 | Temp 100.5°F | Wt 184.0 lb

## 2013-03-11 DIAGNOSIS — M869 Osteomyelitis, unspecified: Secondary | ICD-10-CM

## 2013-03-11 DIAGNOSIS — E039 Hypothyroidism, unspecified: Secondary | ICD-10-CM | POA: Diagnosis present

## 2013-03-11 DIAGNOSIS — E1069 Type 1 diabetes mellitus with other specified complication: Principal | ICD-10-CM | POA: Diagnosis present

## 2013-03-11 DIAGNOSIS — I2510 Atherosclerotic heart disease of native coronary artery without angina pectoris: Secondary | ICD-10-CM | POA: Diagnosis present

## 2013-03-11 DIAGNOSIS — G609 Hereditary and idiopathic neuropathy, unspecified: Secondary | ICD-10-CM | POA: Diagnosis present

## 2013-03-11 DIAGNOSIS — E1169 Type 2 diabetes mellitus with other specified complication: Secondary | ICD-10-CM

## 2013-03-11 DIAGNOSIS — M9080 Osteopathy in diseases classified elsewhere, unspecified site: Secondary | ICD-10-CM | POA: Diagnosis present

## 2013-03-11 DIAGNOSIS — I10 Essential (primary) hypertension: Secondary | ICD-10-CM | POA: Diagnosis present

## 2013-03-11 DIAGNOSIS — T368X5A Adverse effect of other systemic antibiotics, initial encounter: Secondary | ICD-10-CM | POA: Diagnosis present

## 2013-03-11 DIAGNOSIS — L97509 Non-pressure chronic ulcer of other part of unspecified foot with unspecified severity: Secondary | ICD-10-CM

## 2013-03-11 DIAGNOSIS — H9190 Unspecified hearing loss, unspecified ear: Secondary | ICD-10-CM | POA: Diagnosis present

## 2013-03-11 DIAGNOSIS — L270 Generalized skin eruption due to drugs and medicaments taken internally: Secondary | ICD-10-CM | POA: Diagnosis present

## 2013-03-11 DIAGNOSIS — E7800 Pure hypercholesterolemia, unspecified: Secondary | ICD-10-CM | POA: Diagnosis present

## 2013-03-11 DIAGNOSIS — E11621 Type 2 diabetes mellitus with foot ulcer: Secondary | ICD-10-CM

## 2013-03-11 DIAGNOSIS — E109 Type 1 diabetes mellitus without complications: Secondary | ICD-10-CM

## 2013-03-11 LAB — COMPREHENSIVE METABOLIC PANEL
ALT: 14 U/L (ref 0–53)
Albumin: 3.3 g/dL — ABNORMAL LOW (ref 3.5–5.2)
Alkaline Phosphatase: 171 U/L — ABNORMAL HIGH (ref 39–117)
GFR calc non Af Amer: 72 mL/min — ABNORMAL LOW (ref >90)
Potassium: 3.4 mEq/L — ABNORMAL LOW (ref 3.5–5.1)
Sodium: 137 mEq/L (ref 135–145)
Total Protein: 7.2 g/dL (ref 6.0–8.3)

## 2013-03-11 LAB — CBC WITH DIFFERENTIAL/PLATELET
Basophils Absolute: 0 10*3/uL (ref 0.0–0.1)
Basophils Relative: 0 % (ref 0–1)
Eosinophils Absolute: 0.2 10*3/uL (ref 0.0–0.7)
HCT: 38.1 % — ABNORMAL LOW (ref 39.0–52.0)
Hemoglobin: 13.4 g/dL (ref 13.0–17.0)
MCH: 31.9 pg (ref 26.0–34.0)
MCHC: 35.2 g/dL (ref 30.0–36.0)
MCV: 90.7 fL (ref 78.0–100.0)
Monocytes Absolute: 1.2 10*3/uL — ABNORMAL HIGH (ref 0.1–1.0)
Monocytes Relative: 13 % — ABNORMAL HIGH (ref 3–12)
Platelets: 276 10*3/uL (ref 150–400)
RDW: 13.3 % (ref 11.5–15.5)

## 2013-03-11 MED ORDER — ACETAMINOPHEN 650 MG RE SUPP: 650.0000 mg | Freq: Four times a day (QID) | RECTAL | Status: DC | PRN

## 2013-03-11 MED ORDER — ENOXAPARIN SODIUM 40 MG/0.4ML ~~LOC~~ SOLN
40.0000 mg | SUBCUTANEOUS | Status: DC
  Administered 2013-03-11 – 2013-03-14 (×4): 40 mg via SUBCUTANEOUS
  Filled 2013-03-11 (×5): qty 0.4

## 2013-03-11 MED ORDER — ONDANSETRON HCL 4 MG/2ML IJ SOLN: 4.0000 mg | Freq: Four times a day (QID) | INTRAMUSCULAR | Status: DC | PRN

## 2013-03-11 MED ORDER — LEVOFLOXACIN IN D5W 750 MG/150ML IV SOLN
750.0000 mg | INTRAVENOUS | Status: DC
  Administered 2013-03-11 – 2013-03-14 (×4): 750 mg via INTRAVENOUS
  Filled 2013-03-11 (×5): qty 150

## 2013-03-11 MED ORDER — SIMVASTATIN 20 MG PO TABS
20.0000 mg | ORAL_TABLET | Freq: Every day | ORAL | Status: DC
  Administered 2013-03-12 – 2013-03-13 (×2): 20 mg via ORAL
  Filled 2013-03-11 (×4): qty 1

## 2013-03-11 MED ORDER — CLONIDINE HCL 0.1 MG PO TABS
0.1000 mg | ORAL_TABLET | Freq: Every day | ORAL | Status: DC | PRN
  Filled 2013-03-11: qty 1

## 2013-03-11 MED ORDER — LEVOTHYROXINE SODIUM 75 MCG PO TABS
75.0000 ug | ORAL_TABLET | Freq: Every day | ORAL | Status: DC
  Administered 2013-03-12 – 2013-03-15 (×3): 75 ug via ORAL
  Filled 2013-03-11 (×5): qty 1

## 2013-03-11 MED ORDER — AMLODIPINE BESYLATE 10 MG PO TABS
10.0000 mg | ORAL_TABLET | Freq: Every day | ORAL | Status: DC
  Administered 2013-03-12 – 2013-03-15 (×4): 10 mg via ORAL
  Filled 2013-03-11 (×5): qty 1

## 2013-03-11 MED ORDER — METOPROLOL TARTRATE 50 MG PO TABS
75.0000 mg | ORAL_TABLET | Freq: Every day | ORAL | Status: DC
  Administered 2013-03-12 – 2013-03-15 (×4): 75 mg via ORAL
  Filled 2013-03-11 (×5): qty 1

## 2013-03-11 MED ORDER — ACETAMINOPHEN 325 MG PO TABS: 650.0000 mg | ORAL_TABLET | Freq: Four times a day (QID) | ORAL | Status: DC | PRN

## 2013-03-11 MED ORDER — LOSARTAN POTASSIUM 50 MG PO TABS
100.0000 mg | ORAL_TABLET | Freq: Every day | ORAL | Status: DC
  Administered 2013-03-12 – 2013-03-15 (×4): 100 mg via ORAL
  Filled 2013-03-11 (×5): qty 2

## 2013-03-11 MED ORDER — SODIUM CHLORIDE 0.45 % IV SOLN
50.0000 mL/h | INTRAVENOUS | Status: DC
  Administered 2013-03-11 – 2013-03-14 (×4): 50 mL/h via INTRAVENOUS

## 2013-03-11 MED ORDER — ONDANSETRON HCL 4 MG PO TABS: 4.0000 mg | ORAL_TABLET | Freq: Four times a day (QID) | ORAL | Status: DC | PRN

## 2013-03-11 MED ORDER — ASPIRIN EC 81 MG PO TBEC
81.0000 mg | DELAYED_RELEASE_TABLET | Freq: Two times a day (BID) | ORAL | Status: DC
  Administered 2013-03-11 – 2013-03-15 (×7): 81 mg via ORAL
  Filled 2013-03-11 (×9): qty 1

## 2013-03-11 MED ORDER — VANCOMYCIN HCL IN DEXTROSE 1-5 GM/200ML-% IV SOLN
1000.0000 mg | Freq: Two times a day (BID) | INTRAVENOUS | Status: DC
  Administered 2013-03-11: 1000 mg via INTRAVENOUS
  Filled 2013-03-11 (×2): qty 200

## 2013-03-11 MED ORDER — GADOBENATE DIMEGLUMINE 529 MG/ML IV SOLN
17.0000 mL | Freq: Once | INTRAVENOUS | Status: AC | PRN
  Administered 2013-03-11: 17 mL via INTRAVENOUS

## 2013-03-11 MED ORDER — INSULIN GLARGINE 100 UNIT/ML ~~LOC~~ SOLN
12.0000 [IU] | Freq: Every day | SUBCUTANEOUS | Status: DC
  Administered 2013-03-11 – 2013-03-14 (×4): 12 [IU] via SUBCUTANEOUS
  Filled 2013-03-11 (×5): qty 0.12

## 2013-03-11 MED ORDER — INSULIN ASPART 100 UNIT/ML ~~LOC~~ SOLN
0.0000 [IU] | Freq: Three times a day (TID) | SUBCUTANEOUS | Status: DC
  Administered 2013-03-12: 3 [IU] via SUBCUTANEOUS
  Administered 2013-03-12: 5 [IU] via SUBCUTANEOUS
  Administered 2013-03-13: 8 [IU] via SUBCUTANEOUS
  Administered 2013-03-13: 11 [IU] via SUBCUTANEOUS

## 2013-03-11 NOTE — H&P (Signed)
Logan [name] is an 62 y.o. male.   Chief Complaint: Swollen, red, painful right great toe with ascending swelling and erythema HPI: Long history of calluses. About a month ago, after spending a day working outside and getting his feet sweaty, he noticed that a callus on the plantar surface of his right great toe was coming off. He pulled it off, and this pulled it down into the "meaty" section. He cleaned it up. It healed some to about the size of a pencil eraser, but looks infected underneath it. Is now swollen, red, and tender. Swelling has extended up above his ankle, but there is no erythema past the metatarsal-phalangeal joint. Edema worsens over the course of the day. Ran a low fever of 100 last evening. Minimal yellow-whitish discharge on bandage. Some chills over the past two days but not currently. His girlfriend put a purple herbal medication on the wound.  Blood sugars are up and down. 82 this morning. Highest lately was 192 in between meals. Last A1c was 9.8 in December 2013. Patient reports trying to have better diet since then. Compliant with insulin regimen.    Past Medical History  Diagnosis Date  . Hypertension   . Diabetes mellitus   . Hearing loss   . CAD (coronary artery disease)   . Hemochromatosis   . Hyperlipidemia   . Diabetic retinopathy     Past Surgical History  Procedure Laterality Date  . Inner ear surgery      right    Family History  Problem Relation Age of Onset  . Asthma Mother   . Heart failure Mother   . Emphysema Mother   . Alcohol abuse Father   . Cancer Father     prostate  . Stroke Father   . Heart disease Neg Hx   . Colon cancer Neg Hx    Social History:  reports that he has never smoked. He has never used smokeless tobacco. He reports that he does not drink alcohol or use illicit drugs.  Allergies: No Known Allergies  Medications Prior to Admission  Medication Sig Dispense Refill  . amLODipine (NORVASC) 10 MG tablet Take 1 tablet (10  mg total) by mouth daily.  90 tablet  3  . aspirin 81 MG tablet Take 81 mg by mouth 2 (two) times daily.       . insulin glargine (LANTUS) 100 UNIT/ML injection Inject 0.12 mLs (12 Units total) into the skin at bedtime.  10 mL  5  . insulin NPH-regular (NOVOLIN 70/30) (70-30) 100 UNIT/ML injection Inject 17 Units into the skin 2 (two) times daily with a meal.      . levothyroxine (SYNTHROID, LEVOTHROID) 75 MCG tablet Take 1 tablet (75 mcg total) by mouth daily.  90 tablet  3  . losartan (COZAAR) 100 MG tablet Take 1 tablet (100 mg total) by mouth daily.  90 tablet  3  . metoprolol (LOPRESSOR) 50 MG tablet Take 1.5 tablets (75 mg total) by mouth daily.  135 tablet  3  . simvastatin (ZOCOR) 20 MG tablet Take 20 mg by mouth every evening.      . cloNIDine (CATAPRES) 0.1 MG tablet Take 1 tablet (0.1 mg total) by mouth daily as needed. If BP is over 180  60 tablet  11       ROS General: As per HPI.  CV: No chest pain or palpitations. No SOB or DOE.  Pulm: No coughing or wheezing. Seasonal allergies, patient feels this is under  control.  Endocrine: No tingling in feet. Lost some feeling.  MSK: As per HPI. No other joint or muscle pains  Blood pressure 164/83, pulse 78, temperature 98.6 F (37 C), temperature source Oral, height 6\' 2"  (1.88 m), weight 184 lb (83.462 kg), SpO2 97.00%. Physical Exam  BP:  150/58   Pulse:  67   Temp:  100.5 F (38.1 C)   General: Cooperative man sitting in no acute distress.  CV: Regular rate and rhythm, murmurs and rubs.  Pulm: CTAB.  Extremities: On the right lower extremity--significant edema, not pitting but hard and swollen up to mid-calf. With erythema and heat, and pain over the right great toe. Pain on palpation up to the ankle. 5 mm circular lesion about 3 mm deep weeping serous fluid with purplish margins. Underlying muscle is visible. No edema in left lower lower extremity. Peripheral pulses were good, difficult to palpate on right due to swelling.  Sharp/dull discrimination diminished over plantar surface of both feet, symmetric bilaterally.   Assessment/Plan Logan [name] is a 62 year old male with a history of diabetes, CAD, HTN, hypercholesterolemia, hereditary hemochromatosis, syncope, and hearing loss with no history of previous diabetic foot ulcers who presents today with a 5mm x 1mm deep circular lesion weeping serous fluid on the plantar surface of his right great toe. He has a history of poor glucose control and some peripheral neuropathy and diminished sharp/dull discrimination over the plantar aspects of both lower extremities. With edema and tenderness to palpation extending up to his lower calf and systemic signs of infection with a low-grade fever and some reported chills, there is concern for cellulitis vs. Sepsis vs. Osteomyelitis. Given his stable BP, pulse, and O2 sat, low grade fever and lack of orthostasis/malaise, sepsis is not yet likely. The overlying skin changes indicate surrounding cellulitis. On appearance the ulcer appears to be Grade 2-3 on Wagner scale--suggesting possible osteomyelitis. The ulcer is not of a large enough size to suggest probably osteomyelitis, and no bone is visible. Further work-up for the possibility of osteomyelitis may require MRI or serum ESR.   1. Diabetic foot ulcer -   Plan Admission to hospital for further work up   IV antibiotics - Vancomycin per pharmacy; IV levaquin --  MRI to evaluate for possibility of osteomyelitis. Consider orthopedic surgery c/s to evaluate for         need for surgical debridement of wound.  --  Swab wound for culture  -- .  2. DM - will continue home regimen plus ss coverage  3. Cardiac - no cardiac complaints, thus no work up ordered.  Illene Regulus 03/11/2013, 7:43 PM

## 2013-03-11 NOTE — Progress Notes (Signed)
ANTIBIOTIC CONSULT NOTE - INITIAL  Pharmacy Consult for Vancomycin Indication: possible cellulitis  No Known Allergies  Patient Measurements: Height: 6\' 2"  (188 cm) Weight: 184 lb (83.462 kg) IBW/kg (Calculated) : 82.2 Adjusted Body Weight:   Vital Signs: Temp: 98.6 F (37 C) (10/06 1840) Temp src: Oral (10/06 1840) BP: 164/83 mmHg (10/06 1840) Pulse Rate: 78 (10/06 1840) Intake/Output from previous day:   Intake/Output from this shift:    Labs: No results found for this basename: WBC, HGB, PLT, LABCREA, CREATININE,  in the last 72 hours Estimated Creatinine Clearance: 98.9 ml/min (by C-G formula based on Cr of 0.9). No results found for this basename: VANCOTROUGH, VANCOPEAK, VANCORANDOM, GENTTROUGH, GENTPEAK, GENTRANDOM, TOBRATROUGH, TOBRAPEAK, TOBRARND, AMIKACINPEAK, AMIKACINTROU, AMIKACIN,  in the last 72 hours   Microbiology: No results found for this or any previous visit (from the past 720 hour(s)).  Medical History: Past Medical History  Diagnosis Date  . Hypertension   . Diabetes mellitus   . Hearing loss   . CAD (coronary artery disease)   . Hemochromatosis   . Hyperlipidemia   . Diabetic retinopathy     Medications:  Scheduled:  . amLODipine  10 mg Oral Daily  . aspirin EC  81 mg Oral BID  . enoxaparin (LOVENOX) injection  40 mg Subcutaneous Q24H  . [START ON 03/12/2013] insulin aspart  0-15 Units Subcutaneous TID WC  . insulin glargine  12 Units Subcutaneous QHS  . levofloxacin (LEVAQUIN) IV  750 mg Intravenous Q24H  . [START ON 03/12/2013] levothyroxine  75 mcg Oral QAC breakfast  . losartan  100 mg Oral Daily  . metoprolol  75 mg Oral Daily  . [START ON 03/12/2013] simvastatin  20 mg Oral q1800   Infusions:  . sodium chloride     PRN: acetaminophen, acetaminophen, cloNIDine, ondansetron (ZOFRAN) IV, ondansetron Assessment: 62 yo M with possible cellulitis (vs sepsis or osteomyelitis)  Goal of Therapy:  Vancomycin trough level 10-15  mcg/ml  Plan:  Vancomycin 1000mg  IV q 12 hours Measure antibiotic drug levels at steady state Follow up culture results  Loletta Specter 03/11/2013,7:13 PM

## 2013-03-11 NOTE — Assessment & Plan Note (Signed)
--   Admission to hospital for further work up and IV antibiotics. -- MRI to evaluate for possibility of osteomyelitis. Consider orthopedic surgery c/s to evaluate for need for surgical debridement of wound.  -- Swab wound for culture -- IV moxifloxacin 400 mg q daily to treat ulcer, cellulitis, and possible osteomyelitis.

## 2013-03-11 NOTE — Progress Notes (Signed)
Subjective:     Patient ID: Logan [name], male   DOB: 1950/12/15, 62 y.o.   MRN: [id number]  HPI Long history of calluses. About a month ago, after spending a day working outside and getting his feet sweaty, he noticed that a callus on the plantar surface of his right great toe was coming off. He pulled it off, and this pulled it down into the "meaty" section. He cleaned it up. It healed some to about the size of a pencil eraser, but looks infected underneath it. Is now swollen, red, and tender. Swelling has extended up above his ankle, but there is no erythema past the metatarsal-phalangeal joint. Edema worsens over the course of the day. Ran a low fever of 100 last evening. Minimal yellow-whitish discharge on bandage. Some chills over the past two days but not currently. His girlfriend put a purple herbal medication on the wound.   Blood sugars are up and down. 82 this morning. Highest lately was 192 in between meals. Last A1c was 9.8 in December 2013. Patient reports trying to have better diet since then. Compliant with insulin regimen.   Review of Systems General: As per HPI.  CV: No chest pain or palpitations. No SOB or DOE.  Pulm: No coughing or wheezing. Seasonal allergies, patient feels this is under control.  Endocrine: No tingling in feet. Lost some feeling.  MSK: As per HPI. No other joint or muscle pains.    Objective:   Physical Exam Filed Vitals:   03/11/13 1525  BP: 150/58  Pulse: 67  Temp: 100.5 F (38.1 C)   General: Cooperative man sitting in no acute distress.  CV: Regular rate and rhythm, murmurs and rubs.  Pulm: CTAB.  Extremities: On the right lower extremity--significant edema, not pitting but hard and swollen up to mid-calf. With erythema and heat, and pain over the right great toe. Pain on palpation up to the ankle.  5 mm circular lesion about 3 mm deep weeping serous fluid with purplish margins. Underlying muscle is visible. No edema in left lower lower  extremity. Peripheral pulses were good, difficult to palpate on right due to swelling. Sharp/dull discrimination diminished over plantar surface of both feet, symmetric bilaterally.   Current Outpatient Prescriptions on File Prior to Visit  Medication Sig Dispense Refill  . amLODipine (NORVASC) 10 MG tablet Take 1 tablet (10 mg total) by mouth daily.  90 tablet  3  . aspirin 81 MG tablet Take 81 mg by mouth 2 (two) times daily.       . cloNIDine (CATAPRES) 0.1 MG tablet Take 1 tablet (0.1 mg total) by mouth daily as needed. If BP is over 180  60 tablet  11  . glucose blood (FREESTYLE TEST STRIPS) test strip 1 each by Other route as needed. Use as instructed       . insulin glargine (LANTUS) 100 UNIT/ML injection Inject 0.12 mLs (12 Units total) into the skin at bedtime.  10 mL  5  . Insulin Isophane & Regular (HUMULIN 70/30 [place]) Inject 32 Units into the skin as directed.        Marland Kitchen levothyroxine (SYNTHROID, LEVOTHROID) 75 MCG tablet Take 1 tablet (75 mcg total) by mouth daily.  90 tablet  3  . losartan (COZAAR) 100 MG tablet Take 1 tablet (100 mg total) by mouth daily.  90 tablet  3  . metoprolol (LOPRESSOR) 50 MG tablet Take 1.5 tablets (75 mg total) by mouth daily.  135 tablet  3  .  NEEDLE, DISP, 30 G (BD DISP NEEDLES) 30G X 1/2" MISC by Does not apply route. As directed       . simvastatin (ZOCOR) 20 MG tablet TAKE ONE TABLET BY MOUTH AT BEDTIME  90 tablet  0   No current facility-administered medications on file prior to visit.       Assessment:     Mr. Drummer is a 62 year old male with a history of diabetes, CAD, HTN, hypercholesterolemia, hereditary hemochromatosis, syncope, and hearing loss with no history of previous diabetic foot ulcers who presents today with a 5mm x 1mm deep circular lesion weeping serous fluid on the plantar surface of his right great toe. He has a history of poor glucose control and some peripheral neuropathy and diminished sharp/dull discrimination over the plantar  aspects of both lower extremities. With edema and tenderness to palpation extending up to his lower calf and systemic signs of infection with a low-grade fever and some reported chills, there is concern for cellulitis vs. Sepsis vs. Osteomyelitis. Given his stable BP, pulse, and O2 sat, low grade fever and lack of orthostasis/malaise, sepsis is not yet likely. The overlying skin changes indicate surrounding cellulitis. On appearance the ulcer appears to be Grade 2-3 on Wagner scale--suggesting possible osteomyelitis. The ulcer is not of a large enough size to suggest probably osteomyelitis, and no bone is visible. Further work-up for the possibility of osteomyelitis may require MRI or serum ESR.     Plan:     See plan by problem list.

## 2013-03-12 DIAGNOSIS — E039 Hypothyroidism, unspecified: Secondary | ICD-10-CM

## 2013-03-12 DIAGNOSIS — L97509 Non-pressure chronic ulcer of other part of unspecified foot with unspecified severity: Secondary | ICD-10-CM

## 2013-03-12 LAB — GLUCOSE, CAPILLARY
Glucose-Capillary: 227 mg/dL — ABNORMAL HIGH (ref 70–99)
Glucose-Capillary: 194 mg/dL — ABNORMAL HIGH (ref 70–99)
Glucose-Capillary: 259 mg/dL — ABNORMAL HIGH (ref 70–99)

## 2013-03-12 LAB — HEMOGLOBIN A1C: Hgb A1c MFr Bld: 8.9 % — ABNORMAL HIGH (ref <5.7)

## 2013-03-12 MED ORDER — VANCOMYCIN HCL IN DEXTROSE 1-5 GM/200ML-% IV SOLN
1000.0000 mg | Freq: Three times a day (TID) | INTRAVENOUS | Status: DC
  Administered 2013-03-12 – 2013-03-15 (×9): 1000 mg via INTRAVENOUS
  Filled 2013-03-12 (×10): qty 200

## 2013-03-12 MED ORDER — VANCOMYCIN HCL IN DEXTROSE 1-5 GM/200ML-% IV SOLN
1000.0000 mg | Freq: Once | INTRAVENOUS | Status: AC
  Administered 2013-03-12: 1000 mg via INTRAVENOUS
  Filled 2013-03-12: qty 200

## 2013-03-12 NOTE — Progress Notes (Signed)
VASCULAR LAB PRELIMINARY  ARTERIAL  ABI completed:  ABIs not accurate.  Waveforms within normal limits.    RIGHT    LEFT    PRESSURE WAVEFORM  PRESSURE WAVEFORM  BRACHIAL 154 Triphasic  BRACHIAL Lost but within range of right Triphasic   DP 199 Triphasic  DP 185 Triphasic   AT   AT    PT 307 Triphasic  PT 201 Triphasic   PER   PER    GREAT TOE  NA GREAT TOE  NA    RIGHT LEFT  ABI 1.99  Not accurate due to falsely elevated pedal pressures 1.31  Not accurate due to falsely elevated pedal pressures     Ben Sanz, RVT 03/12/2013, 11:26 AM

## 2013-03-12 NOTE — Progress Notes (Signed)
Patient ID: Logan [name], male   DOB: 05-Sep-1950, 62 y.o.   MRN: [id number]  Subjective: Logan [name] is a 62 year old male with a history of diabetes, CAD, HTN, hypercholesterolemia, hereditary hemochromatosis, syncope, and hearing loss with no history of previous diabetic foot ulcers currently on hospital day 2 for treatment of a diabetic foot ulcer with MRI evidence of osteomyelitis. No acute events overnight. Logan [name] reports that he is doing well. He has no medical complaints today and feels that the pain on ambulation is decreasing. His appetite is good. Logan [name] is aware of the osteomyelitis and understands that surgical debridement and continued IV antibiotics for 6 weeks may be necessary.   Consults:  -- Dietician, Ailene Ards: Recommends adding novolog 6 unit with each meal TID if patient eats >50% of meal.  -- Pharmacist, Lynann Beaver: Recommends Vancomycin 1g IV Q8 hrs. Measure at steady state and follow up on renal results.  -- Orthopedic surgery consult, Dr. Lajoyce Corners suggests that amputation is necessary.   ROS:  General: No fevers, no chills (reports being chilly and shivering in the MRI room, but resolved on being brought back to warm room) or sweats.  CV: No chest pain or SOB.  Abdominal: No abdominal pain, nausea, vomiting, diarrhea, or constipation.  MSK: No pain, continued swelling of right foot.  Urologic: No change in urine output.   Objective: Filed Vitals:   03/12/13 1312  BP: 159/89  Pulse: 58  Temp: 98.9 F (37.2 C)  Resp: 20   General: Cooperative man sitting in NAD, dressed in hospital clothing.  CV: RRR, no murmurs, rubs or gallops. Pulm: CTAB, no wheezing or crackles.  Extremities: Right extremity with 2+ non-pitting, non-tender edema up to ankle. Right great toe is very edematous, erythematous and warm. No change in ulcer under right great toe overnight--5 mm circular lesion about 3 mm deep weeping serous fluid with purplish margins. Underlying muscle is  visible.  Scheduled Meds: . amLODipine  10 mg Oral Daily  . aspirin EC  81 mg Oral BID  . enoxaparin (LOVENOX) injection  40 mg Subcutaneous Q24H  . insulin aspart  0-15 Units Subcutaneous TID WC  . insulin glargine  12 Units Subcutaneous QHS  . levofloxacin (LEVAQUIN) IV  750 mg Intravenous Q24H  . levothyroxine  75 mcg Oral QAC breakfast  . losartan  100 mg Oral Daily  . metoprolol  75 mg Oral Daily  . simvastatin  20 mg Oral q1800  . vancomycin  1,000 mg Intravenous Q8H   Continuous Infusions: . sodium chloride 50 mL/hr (03/12/13 1700)   PRN Meds:.acetaminophen, acetaminophen, cloNIDine, ondansetron (ZOFRAN) IV, ondansetron  Labs:  --A1c, 03/11/13: 8.9   --Blood glucose 151   CMP 03/11/13     Component Value Date/Time   NA 137 03/11/2013 1957   K 3.4* 03/11/2013 1957   CL 97 03/11/2013 1957   CO2 28 03/11/2013 1957   GLUCOSE 151* 03/11/2013 1957   BUN 15 03/11/2013 1957   CREATININE 1.08 03/11/2013 1957   CALCIUM 9.1 03/11/2013 1957   PROT 7.2 03/11/2013 1957   ALBUMIN 3.3* 03/11/2013 1957   AST 18 03/11/2013 1957   ALT 14 03/11/2013 1957   ALKPHOS 171* 03/11/2013 1957   BILITOT 0.6 03/11/2013 1957   GFRNONAA 72* 03/11/2013 1957   GFRAA 83* 03/11/2013 1957   CBC 03/11/13:    Component Value Date/Time   WBC 9.4 03/11/2013 1957   RBC 4.20* 03/11/2013 1957   HGB 13.4 03/11/2013 1957  HCT 38.1* 03/11/2013 1957   PLT 276 03/11/2013 1957   MCV 90.7 03/11/2013 1957   MCH 31.9 03/11/2013 1957   MCHC 35.2 03/11/2013 1957   RDW 13.3 03/11/2013 1957   LYMPHSABS 1.9 03/11/2013 1957   MONOABS 1.2* 03/11/2013 1957   EOSABS 0.2 03/11/2013 1957   BASOSABS 0.0 03/11/2013 1957   Imaging:   --MRI of Right Foot, 03/11/13: IMPRESSION:  1. Terminal phalanx great toe osteomyelitis with sinus tract  extending the medial plantar aspect of the great toe.  2. Great toe IP joint effusion which may be reactive or represent  septic arthritis.  Assessment & Plan:  Logan [name] is a 62 year old male with a  history of diabetes, CAD, HTN, hypercholesterolemia, hereditary hemochromatosis, syncope, and hearing loss with no history of previous diabetic foot ulcers currently on hospital day 2 for treatment of a diabetic foot ulcer with MRI evidence of osteomyelitis.   1. Diabetic Foot Ulcer/Osteomyelitis: Ulcer appears to be Wagner scale grade 2-3 with MRI evidence of osteomyelitis. Orthopedic surgery suggests amputation of great toe phalange is necessary. -- Continue IV vancomycin 1g Q8 hours and IV levofloxacin 750 mg q 24 hrs.  -- Measure vancomycin trough level at steady state--28-43 hours after first dose at 23:29 on 03/11/13.  -- With vancomycin, BUN/Crt measured at baseline and are normal, may require recheck if evidence of renal impairment develops.  -- Orthopedic surgery will schedule amputation this week. After amputation, IV antibiotics can likely be discontinued within 24-48 hours pending development of any complications.   2. Diabetes: He has a history of poor glucose control with most recent A1c of 8.9, some peripheral neuropathy and diminished sharp/dull discrimination over the plantar aspects of both lower extremities. Patient has been offered meal time short-acting insulin the past and declined; will continue to encourage patient to accept meal-time insulin at home for improved glucose control.  -- Continue insulin lantus 12 units QHS -- Continue insulin novolog 6 units TID with meals--- hold if patient eats <50% of meal.  -- Continue losartan 100 mg daily  3. HTN, Hypercholesterolemia: -- Continue amlodipine 10 mg daily -- Continue metoprolol 75 mg daily  -- Continue simvastatin 20 mg   4. Hypothyroidism:  -- Continue levothyroxine 75 mcg daily.   5. DVT prophylaxis: Patient is ambulating well.  -- Continue enoxaparin 40 mg [place] q 24 hours  6. Safety/Dispo:  -- Orthopedic surgery will schedule amputation.     Reviewed patients labs and imaging studies. Reviewed above note. Patient  seen and examined. Discussed patient care with Dr. Lajoyce Corners. Agree with the above

## 2013-03-12 NOTE — Progress Notes (Addendum)
Inpatient Diabetes Program Recommendations  AACE/ADA: New Consensus Statement on Inpatient Glycemic Control (2013)  Target Ranges:  Prepandial:   less than 140 mg/dL      Peak postprandial:   less than 180 mg/dL (1-2 hours)      Critically ill patients:  140 - 180 mg/dL   Reason for Visit: Elevated HgbA1C and Hyperglycemia  Pt states he takes Humulin N12 units QHS and 70/30 17 units bid at home.  Checks blood sugars several times/day. States he hasn't had hypoglycemia lately, but had low blood sugars in the past. States he has attended diabetes classes in the past.  Inpatient Diabetes Program Recommendations Insulin - Meal Coverage: Add meal coverage insulin - Novolog 6 units tidwc if pt eats >50% meal HgbA1C - 8.9% sub-optimal for healing.  Note: Will follow while inpatient. Thank you. Ailene Ards, RD, LDN, CDE Inpatient Diabetes Coordinator [phone]

## 2013-03-12 NOTE — Progress Notes (Signed)
ANTIBIOTIC CONSULT NOTE - INITIAL  Pharmacy Consult for Vancomycin Indication: Diabetic foot infection, r/o osteomyelitis  No Known Allergies  Patient Measurements: Height: 6\' 2"  (188 cm) Weight: 184 lb (83.462 kg) IBW/kg (Calculated) : 82.2  Vital Signs: Temp: 99.1 F (37.3 C) (10/07 0621) Temp src: Oral (10/07 0621) BP: 143/66 mmHg (10/07 0621) Pulse Rate: 61 (10/07 0621) Intake/Output from previous day: 10/06 0701 - 10/07 0700 In: 1016.7 [P.O.:240; I.V.:426.7; IV Piggyback:350] Out: -   Labs:  Recent Labs  03/11/13 1957  WBC 9.4  HGB 13.4  PLT 276  CREATININE 1.08   Estimated Creatinine Clearance: 82.5 ml/min (by C-G formula based on Cr of 1.08).  Microbiology: No results found for this or any previous visit (from the past 720 hour(s)).   Medications:  Anti-infectives   Start     Dose/Rate Route Frequency Ordered Stop   03/11/13 2200  vancomycin (VANCOCIN) IVPB 1000 mg/200 mL premix     1,000 mg 200 mL/hr over 60 Minutes Intravenous Every 12 hours 03/11/13 2055     03/11/13 1900  levofloxacin (LEVAQUIN) IVPB 750 mg     750 mg 100 mL/hr over 90 Minutes Intravenous Every 24 hours 03/11/13 1857        Assessment: 62 yo M admitted 10/6 with wound on the plantar surface of his right great toe.  Prior to admission, this had progressed to cellulitis and edema up to mid-calf.  Concerning for possible osteomyelitis.  Pharmacy asked to dose Vancomycin on 10/6.  Day #2 Vancomycin (per pharmacy dosing) and Levaquin  SCr 1.08 on admission with CrCl ~ 83 ml/min  WBC 9.4  No cultures obtained at this time.  Goal of Therapy:  Vancomycin trough level 15-20 mcg/ml  Plan:   Increase to Vancomycin 1g IV q8h.  Measure Vanc trough at steady state.  Follow up renal fxn and culture results.   Lynann Beaver PharmD, BCPS Pager [phone] 03/12/2013 8:44 AM

## 2013-03-12 NOTE — Consult Note (Signed)
Reason for Consult: Osteomyelitis abscess ulceration right great toe Referring Physician: Dr. Illene Regulus  Logan [name] is an 62 y.o. male.  HPI: Patient is a 62 year old gentleman with diabetes she reports a history of ulceration to the right great toe. Noticed increased redness swelling and pain and presented with cellulitis extending up his leg. Patient has been started on appropriate IV antibiotics and has ankle brachial indices as well as an MRI scan.  Past Medical History  Diagnosis Date  . Hypertension   . Diabetes mellitus   . Hearing loss   . CAD (coronary artery disease)   . Hemochromatosis   . Hyperlipidemia   . Diabetic retinopathy     Past Surgical History  Procedure Laterality Date  . Inner ear surgery      right    Family History  Problem Relation Age of Onset  . Asthma Mother   . Heart failure Mother   . Emphysema Mother   . Alcohol abuse Father   . Cancer Father     prostate  . Stroke Father   . Heart disease Neg Hx   . Colon cancer Neg Hx     Social History:  reports that he has never smoked. He has never used smokeless tobacco. He reports that he does not drink alcohol or use illicit drugs.  Allergies: No Known Allergies  Medications: I have reviewed the patient's current medications.  Results for orders placed during the hospital encounter of 03/11/13 (from the past 48 hour(s))  HEMOGLOBIN A1C     Status: Abnormal   Collection Time    03/11/13  7:57 PM      Result Value Range   Hemoglobin A1C 8.9 (*) <5.7 %   Comment: (NOTE)                                                                               According to the ADA Clinical Practice Recommendations for 2011, when     HbA1c is used as a screening test:      >=6.5%   Diagnostic of Diabetes Mellitus               (if abnormal result is confirmed)     5.7-6.4%   Increased risk of developing Diabetes Mellitus     References:Diagnosis and Classification of Diabetes Mellitus,Diabetes    Care,2011,34(Suppl 1):S62-S69 and Standards of Medical Care in             Diabetes - 2011,Diabetes Care,2011,34 (Suppl 1):S11-S61.   Mean Plasma Glucose 209 (*) <117 mg/dL   Comment: Performed at [facility]  COMPREHENSIVE METABOLIC PANEL     Status: Abnormal   Collection Time    03/11/13  7:57 PM      Result Value Range   Sodium 137  135 - 145 mEq/L   Potassium 3.4 (*) 3.5 - 5.1 mEq/L   Chloride 97  96 - 112 mEq/L   CO2 28  19 - 32 mEq/L   Glucose, Bld 151 (*) 70 - 99 mg/dL   BUN 15  6 - 23 mg/dL   Creatinine, Ser 1.91  0.50 - 1.35 mg/dL   Calcium 9.1  8.4 - 47.8  mg/dL   Total Protein 7.2  6.0 - 8.3 g/dL   Albumin 3.3 (*) 3.5 - 5.2 g/dL   AST 18  0 - 37 U/L   ALT 14  0 - 53 U/L   Alkaline Phosphatase 171 (*) 39 - 117 U/L   Total Bilirubin 0.6  0.3 - 1.2 mg/dL   GFR calc non Af Amer 72 (*) >90 mL/min   GFR calc Af Amer 83 (*) >90 mL/min   Comment: (NOTE)     The eGFR has been calculated using the CKD EPI equation.     This calculation has not been validated in all clinical situations.     eGFR's persistently <90 mL/min signify possible Chronic Kidney     Disease.  MAGNESIUM     Status: None   Collection Time    03/11/13  7:57 PM      Result Value Range   Magnesium 1.9  1.5 - 2.5 mg/dL  CBC WITH DIFFERENTIAL     Status: Abnormal   Collection Time    03/11/13  7:57 PM      Result Value Range   WBC 9.4  4.0 - 10.5 K/uL   RBC 4.20 (*) 4.22 - 5.81 MIL/uL   Hemoglobin 13.4  13.0 - 17.0 g/dL   HCT 57.8 (*) 46.9 - 62.9 %   MCV 90.7  78.0 - 100.0 fL   MCH 31.9  26.0 - 34.0 pg   MCHC 35.2  30.0 - 36.0 g/dL   RDW 52.8  41.3 - 24.4 %   Platelets 276  150 - 400 K/uL   Neutrophils Relative % 64  43 - 77 %   Neutro Abs 6.0  1.7 - 7.7 K/uL   Lymphocytes Relative 20  12 - 46 %   Lymphs Abs 1.9  0.7 - 4.0 K/uL   Monocytes Relative 13 (*) 3 - 12 %   Monocytes Absolute 1.2 (*) 0.1 - 1.0 K/uL   Eosinophils Relative 2  0 - 5 %   Eosinophils Absolute 0.2  0.0 - 0.7 K/uL    Basophils Relative 0  0 - 1 %   Basophils Absolute 0.0  0.0 - 0.1 K/uL  GLUCOSE, CAPILLARY     Status: Abnormal   Collection Time    03/11/13 10:15 PM      Result Value Range   Glucose-Capillary 109 (*) 70 - 99 mg/dL  GLUCOSE, CAPILLARY     Status: None   Collection Time    03/12/13  7:23 AM      Result Value Range   Glucose-Capillary 99  70 - 99 mg/dL   Comment 1 Notify RN    GLUCOSE, CAPILLARY     Status: Abnormal   Collection Time    03/12/13 11:26 AM      Result Value Range   Glucose-Capillary 227 (*) 70 - 99 mg/dL   Comment 1 Notify RN    GLUCOSE, CAPILLARY     Status: Abnormal   Collection Time    03/12/13  4:44 PM      Result Value Range   Glucose-Capillary 194 (*) 70 - 99 mg/dL   Comment 1 Notify RN      Mr Foot Right W Wo Contrast  03/12/2013   CLINICAL DATA:  Great toe erythema with swelling and pain. Wound over the medial plantar surface of the great toe. Diabetic foot ulcer. Osteomyelitis.  EXAM: MRI OF THE RIGHT FOREFOOT WITHOUT AND WITH CONTRAST  TECHNIQUE: Multiplanar, multisequence MR imaging  was performed both before and after administration of intravenous contrast.  CONTRAST:  17mL MULTIHANCE GADOBENATE DIMEGLUMINE 529 MG/ML IV SOLN  COMPARISON:  None.  FINDINGS: There are no plain films available for comparison. Typically plain films are helpful in the interpretation of the MRI.  There is an ulceration over the plantar and medial aspect of the great toe terminal phalanx base. Diffuse phlegmon present in the soft tissues without a soft tissue abscess. Plegmon tracks to the cortical surface of the terminal phalanx and there is effacement of normal fatty marrow, cortical erosion of the medial plantar aspect of the terminal phalanx base and post gadolinium enhancement compatible with osteomyelitis of the terminal phalanx. Reactive inflammatory changes are present in the proximal phalanx along with osteoarthritis. There is a 1st MTP joint effusion which may be reactive or  represent septic arthritis. Sinus tract extends to the skin surface (series 12 images 8 and 9).  The other rays appear within normal limits. Flexor and extensor tendons are normal. Sesamoid bones within normal limits allowing for osteoarthritis. First MTP joint osteoarthritis is present as well. The extensor tendons are normal. There is cellulitis with edema and enhancement of the subcutaneous fat of the great toe.  IMPRESSION: 1. Terminal phalanx great toe osteomyelitis with sinus tract extending the medial plantar aspect of the great toe. 2. Great toe IP joint effusion which may be reactive or represent septic arthritis.   Electronically Signed   By: Andreas Newport M.D.   On: 03/12/2013 08:08    Review of Systems  All other systems reviewed and are negative.   Blood pressure 159/89, pulse 58, temperature 98.9 F (37.2 C), temperature source Oral, resp. rate 20, height 6\' 2"  (1.88 m), weight 83.462 kg (184 lb), SpO2 99.00%. Physical Exam On examination patient has significant swelling in the right lower extremity the cellulitis is focal to the great toe with sausage digit swelling. He does not have palpable pulses and this is most likely due to the swelling. Ankle brachial indices shows elevated blood pressures consistent with calcified vessels but he does have triphasic flow with adequate circulation. There is purulent drainage from the [facility] grade 3 ulcer the plantar aspect of the IP joint. Review of MRI scan shows destructive osteomyelitic changes of the tuft and involvement with the ulcer of the IP joint. Assessment/Plan: Assessment: Osteomyelitis abscess and ulceration right great toe.  Plan: Will plan for amputation of the right great toe through the MTP joint. Risks and benefits were discussed including persistent infection nonhealing of the wound need for additional surgery. The importance of elevation nonweightbearing for the first 3 weeks was discussed. We will plan for surgery as an  add-on on either Thursday or Friday.  Thurmond Hildebran V 03/12/2013, 6:34 PM

## 2013-03-13 DIAGNOSIS — L97509 Non-pressure chronic ulcer of other part of unspecified foot with unspecified severity: Secondary | ICD-10-CM

## 2013-03-13 DIAGNOSIS — E1169 Type 2 diabetes mellitus with other specified complication: Secondary | ICD-10-CM

## 2013-03-13 LAB — BASIC METABOLIC PANEL
CO2: 27 mEq/L (ref 19–32)
Chloride: 97 mEq/L (ref 96–112)
Creatinine, Ser: 1.15 mg/dL (ref 0.50–1.35)
GFR calc Af Amer: 77 mL/min — ABNORMAL LOW (ref >90)
GFR calc non Af Amer: 66 mL/min — ABNORMAL LOW (ref >90)
Sodium: 133 mEq/L — ABNORMAL LOW (ref 135–145)

## 2013-03-13 LAB — VANCOMYCIN, TROUGH: Vancomycin Tr: 18.3 ug/mL (ref 10.0–20.0)

## 2013-03-13 LAB — GLUCOSE, CAPILLARY
Glucose-Capillary: 287 mg/dL — ABNORMAL HIGH (ref 70–99)
Glucose-Capillary: 302 mg/dL — ABNORMAL HIGH (ref 70–99)

## 2013-03-13 MED ORDER — CHLORHEXIDINE GLUCONATE 4 % EX LIQD
60.0000 mL | Freq: Once | CUTANEOUS | Status: DC
  Filled 2013-03-13: qty 60

## 2013-03-13 MED ORDER — INSULIN ASPART 100 UNIT/ML ~~LOC~~ SOLN
0.0000 [IU] | SUBCUTANEOUS | Status: DC
  Administered 2013-03-14: 5 [IU] via SUBCUTANEOUS
  Administered 2013-03-14: 3 [IU] via SUBCUTANEOUS
  Administered 2013-03-14 – 2013-03-15 (×2): 8 [IU] via SUBCUTANEOUS

## 2013-03-13 NOTE — Progress Notes (Signed)
Patient ID: Logan [name], male   DOB: Nov 23, 1950, 62 y.o.   MRN: [id number]  Subjective: Logan [name] is a 62 year old man with a history of diabetes, CAD, HTN, hypercholesterolemia, hereditary hemochromatosis, syncope, and hearing loss with no history of previous diabetic foot ulcers currently on hospital day 3 for treatment of a diabetic foot ulcer with MRI evidence of osteomyelitis.   No acute events overnight. Patient is doing "well" this morning. He has been able to ambulate well and has more energy than he knows what to do with in the hospital. Notes some increased sensation in right foot, but feels no pain on ambulating or at rest. He is concerned about how much anesthesia he will get for the amputation. States that he would prefer to "know where he's at."   With regards to his diabetes, he reports that he "had a low blood sugar attack overnight" a few months ago, but didn't take his blood sugar. Got up and ate some honey, crackers. In the past, his lowest blood sugar was 26. He has had a few episodes of low blood sugar in the past. In the past it felt like tremulousness and diaphoresis, but more recently it is less obvious when he has low blood sugar. Now, he has changed his diet and only has lower blood sugar attacks infrequently.   ROS:  --General: No fever, no chills.  --Neuro: No HA, no dizziness or changes in vision.  --GI: No abdominal pain, N/V/D. No loss of appetite.  --CV: No chest pain or palpitations.  --Pulm: No coughing or wheezing. Some allergies that have improved since hospitalization.   Objective:  Filed Vitals:   03/12/13 2200  BP: 170/78  Pulse: 63  Temp: 98.6 F (37 C)  Resp:     General: Cooperative man sitting in hospital clothes upright in his chair.  Extremities: Right lower extremity, edema is less firm and more pitting today. Significant edema continues in right foot, but 1+ pitting edema up to mid-calf. From right ankle down, there is erythema, more erythema  than 03/12/13.  CV: RRR, no murmurs, rubs, or gallops.  Pulm: CTAB, no wheezes or crackles.   Scheduled Meds: . amLODipine  10 mg Oral Daily  . aspirin EC  81 mg Oral BID  . enoxaparin (LOVENOX) injection  40 mg Subcutaneous Q24H  . insulin aspart  0-15 Units Subcutaneous TID WC  . insulin glargine  12 Units Subcutaneous QHS  . levofloxacin (LEVAQUIN) IV  750 mg Intravenous Q24H  . levothyroxine  75 mcg Oral QAC breakfast  . losartan  100 mg Oral Daily  . metoprolol  75 mg Oral Daily  . simvastatin  20 mg Oral q1800  . vancomycin  1,000 mg Intravenous Q8H   Continuous Infusions: . sodium chloride 50 mL/hr (03/12/13 1700)   PRN Meds:.acetaminophen, acetaminophen, cloNIDine, ondansetron (ZOFRAN) IV, ondansetron  Labs:   A1c, 03/11/13: 8.9  CMP 03/11/13    Component  Value  Date/Time    NA  137  03/11/2013 1957    K  3.4*  03/11/2013 1957    CL  97  03/11/2013 1957    CO2  28  03/11/2013 1957    GLUCOSE  151*  03/11/2013 1957    BUN  15  03/11/2013 1957    CREATININE  1.08  03/11/2013 1957    CALCIUM  9.1  03/11/2013 1957    PROT  7.2  03/11/2013 1957    ALBUMIN  3.3*  03/11/2013 1957  AST  18  03/11/2013 1957    ALT  14  03/11/2013 1957    ALKPHOS  171*  03/11/2013 1957    BILITOT  0.6  03/11/2013 1957    GFRNONAA  72*  03/11/2013 1957    GFRAA  83*  03/11/2013 1957    CBC 03/11/13:    Component  Value  Date/Time    WBC  9.4  03/11/2013 1957    RBC  4.20*  03/11/2013 1957    HGB  13.4  03/11/2013 1957    HCT  38.1*  03/11/2013 1957    PLT  276  03/11/2013 1957    MCV  90.7  03/11/2013 1957    MCH  31.9  03/11/2013 1957    MCHC  35.2  03/11/2013 1957    RDW  13.3  03/11/2013 1957    LYMPHSABS  1.9  03/11/2013 1957    MONOABS  1.2*  03/11/2013 1957    EOSABS  0.2  03/11/2013 1957    BASOSABS  0.0  03/11/2013 1957    Imaging:   MRI of Right Foot, 03/11/13:  IMPRESSION:  1. Terminal phalanx great toe osteomyelitis with sinus tract  extending the medial plantar aspect of the great toe.   2. Great toe IP joint effusion which may be reactive or represent  septic arthritis.  Ankle Brachial Index, 03/12/13:  Ankle blood pressures are elevated--consistent with calcified vessels.   Assessment & Plan: Logan [name] is a 62 year old man with a history of diabetes who is currently on hospital day 3 for treatment of a diabetic foot ulcer with MRI evidence of osteomyelitis.   1. . Diabetic Foot Ulcer/Osteomyelitis: Ulcer appears to be Wagner scale grade 2-3 with MRI evidence of osteomyelitis. Orthopedic surgery suggests amputation of great toe phalange is necessary.  -- Continue IV vancomycin 1g Q8 hours and IV levofloxacin 750 mg q 24 hrs.  -- Measure vancomycin trough level at steady state--28-43 hours after first dose at 23:29 on 03/11/13.  -- With vancomycin, BUN/Crt measured at baseline and are normal, may require recheck if evidence of renal impairment develops.  -- Orthopedic surgery will schedule amputation this week, likely 10/9/ or 10/10. After amputation, IV antibiotics can likely be discontinued within 24-48 hours pending development of any complications.   2. Diabetes: He has a history of poor glucose control with most recent A1c of 8.9, some peripheral neuropathy and diminished sharp/dull discrimination over the plantar aspects of both lower extremities. Patient has been offered meal time short-acting insulin the past and declined; will continue to encourage patient to accept meal-time insulin at home for improved glucose control. His blood sugars in the hospital have been elevated from 227-259 throughout the day; however, his FBP on the morning of 10/7 was 99, so increasing the dose of lantus may precipitate hypoglycemia. It is possible that low blood sugars at night are leading to rebound hyperglycemia at his morning FBP or he may have nocturnal hyperglycemia as well. May consider 3 am glucose checks to evaluate this possibility.  -- Continue insulin lantus 12 units QHS. --  Continue insulin novolog 6 units TID with meals--- hold if patient eats <50% of meal.  -- Continue losartan 100 mg daily for kidney protection.   3. HTN, Hypercholesterolemia: Some high pressures while in the hospital (highest since admission is 170/78, with more common BPs in the 150s/50s-60s). Prior to admission last office visit was in January 2014 and showed good blood pressure control at 124/68.  Given his comorbidity with diabetes, his target SBP should be 130s. As an outpatient,he takes clonidine 0.1 mg as needed for SBP >180. Patient reports taking the clonidine about 4 times in the past 8 months. Reports no HA or ability to tell when his BP is high. Will continue this in the hospital.  -- Continue amlodipine 10 mg daily  -- Continue metoprolol 75 mg daily  -- Continue simvastatin 20 mg  -- Continue clonidine 0.1 mg Q 24 hrs prn for SBP >180 mm Hg  4. Hypothyroidism:  -- Continue levothyroxine 75 mcg daily.   5. DVT prophylaxis: Patient is ambulating well.  -- Continue enoxaparin 40 mg Everson q 24 hours   6. Safety/Dispo: Orthopedic surgery will schedule amputation. After amputation, PT/OT c/s may be required to aid in ambulation without the right great toe.   7. Patient's SO reports that he has been having abdominal pain follow meals with any fat content. His sister has GB disease and there is a concern as whether he has GB disease  Plan - GB u/s  Patient interviewed and examined. Agree with note above. Suggested to the patient that he have epidural anesthesia if possible. Discussed discharge plans. Dr. Lajoyce Corners will specify care plans re: activity, PT, wound care.  Heis for surgery tomorrow PM.

## 2013-03-13 NOTE — Progress Notes (Signed)
ANTIBIOTIC CONSULT NOTE - FOLLOW UP  Pharmacy Consult for Vancomycin Indication: Osteomyelitis, abscess, and ulceration right great toe  No Known Allergies  Patient Measurements: Height: 6\' 2"  (188 cm) Weight: 184 lb (83.462 kg) IBW/kg (Calculated) : 82.2  Vital Signs: Temp: 98.6 F (37 C) (10/07 2200) Temp src: Oral (10/07 2200) BP: 170/78 mmHg (10/07 2200) Pulse Rate: 63 (10/07 2200) Intake/Output from previous day: 10/07 0701 - 10/08 0700 In: 2040 [P.O.:240; I.V.:1250; IV Piggyback:550] Out: 1100 [Urine:1100]  Labs:  Recent Labs  03/11/13 1957 03/13/13 0800  WBC 9.4  --   HGB 13.4  --   PLT 276  --   CREATININE 1.08 1.15   Estimated Creatinine Clearance: 77.4 ml/min (by C-G formula based on Cr of 1.15).  Recent Labs  03/13/13 0850  VANCOTROUGH 18.3     Microbiology: No results found for this or any previous visit (from the past 720 hour(s)).  Anti-infectives: 10/6 >> Vanc >> 10/6 >>Levaquin >>   Assessment: 62 yo M admitted 10/6 with wound on the plantar surface of his right great toe. Prior to admission, this had progressed to cellulitis and edema up to mid-calf. Concerning for possible osteomyelitis. Pharmacy asked to dose Vancomycin on 10/6.   Day #3 Vancomycin (per pharmacy dosing) and Levaquin  MRI shows destructive osteomyelitic changes; per MD notes, plan is for amputation through the MTP joint on either Thursday or Friday.  SCr increased 1.08 > 1.15, with CrCl ~ 77 ml/min   No cultures obtained at this time  Vancomycin trough level is therapeutic at 18.3  Goal of Therapy:  Vancomycin trough level 15-20 mcg/ml  Plan:   Continue Vancomycin 1g IV q8h.  Recheck Vanc trough at steady state as needed.  Follow up renal fxn and culture results.   Lynann Beaver PharmD, BCPS Pager [phone] 03/13/2013 10:08 AM

## 2013-03-13 NOTE — Progress Notes (Signed)
Inpatient Diabetes Program Recommendations  AACE/ADA: New Consensus Statement on Inpatient Glycemic Control (2013)  Target Ranges:  Prepandial:   less than 140 mg/dL      Peak postprandial:   less than 180 mg/dL (1-2 hours)      Critically ill patients:  140 - 180 mg/dL   Reason for Visit: Hyperglycemia  Results for GURJOT, BRISCO (MRN [id number]) as of 03/13/2013 13:36  Ref. Range 03/12/2013 16:44 03/12/2013 20:55 03/13/2013 07:42 03/13/2013 11:20  Glucose-Capillary Latest Range: 70-99 mg/dL 409 (H) 811 (H) 914 (H) 315 (H)   Inpatient Diabetes Program Recommendations Insulin - Meal Coverage: Add meal coverage insulin - Novolog 6 units tidwc if pt eats >50% meal Needs meal coverage insulin. Pt was getting 70/30 17 units bid in addition to Lantus 12 units at home. Will likely benefit from increase in Lantus to 22 units QHS to cover for the basal insulin in 70/30.   Note: Will follow daily. Thank you. Ailene Ards, RD, LDN, CDE Inpatient Diabetes Coordinator [phone]

## 2013-03-13 NOTE — Progress Notes (Signed)
Patient ID: Logan [name], male   DOB: 1950-12-24, 62 y.o.   MRN: [id number] Will plan for amputation of the right great toe at the MTP joint Thursday after 5 PM. Patient to be n.p.o. after midnight tonight.

## 2013-03-14 ENCOUNTER — Encounter (HOSPITAL_COMMUNITY): Payer: BC Managed Care – PPO | Admitting: Anesthesiology

## 2013-03-14 ENCOUNTER — Inpatient Hospital Stay (HOSPITAL_COMMUNITY): Payer: BC Managed Care – PPO

## 2013-03-14 ENCOUNTER — Encounter (HOSPITAL_COMMUNITY)
Admission: AD | Disposition: A | Discharge status: Home Health | Payer: Self-pay | Source: Ambulatory Visit | Attending: Internal Medicine

## 2013-03-14 ENCOUNTER — Inpatient Hospital Stay (HOSPITAL_COMMUNITY): Payer: BC Managed Care – PPO | Admitting: Anesthesiology

## 2013-03-14 HISTORY — PX: AMPUTATION: SHX166

## 2013-03-14 LAB — SURGICAL PCR SCREEN
MRSA, PCR: NEGATIVE
Staphylococcus aureus: NEGATIVE

## 2013-03-14 LAB — GLUCOSE, CAPILLARY
Glucose-Capillary: 75 mg/dL (ref 70–99)
Glucose-Capillary: 117 mg/dL — ABNORMAL HIGH (ref 70–99)
Glucose-Capillary: 94 mg/dL (ref 70–99)
Glucose-Capillary: 208 mg/dL — ABNORMAL HIGH (ref 70–99)

## 2013-03-14 LAB — CREATININE, SERUM
Creatinine, Ser: 1.01 mg/dL (ref 0.50–1.35)
GFR calc Af Amer: >90 mL/min (ref >90)

## 2013-03-14 SURGERY — AMPUTATION DIGIT
Anesthesia: Regional | Site: Foot | Laterality: Right | Wound class: Clean

## 2013-03-14 MED ORDER — FENTANYL CITRATE 0.05 MG/ML IJ SOLN
INTRAMUSCULAR | Status: DC | PRN
  Administered 2013-03-14 (×2): 50 ug via INTRAVENOUS

## 2013-03-14 MED ORDER — SODIUM CHLORIDE 0.9 % IV SOLN
INTRAVENOUS | Status: DC | PRN
  Administered 2013-03-14: 17:00:00 via INTRAVENOUS

## 2013-03-14 MED ORDER — METOCLOPRAMIDE HCL 10 MG PO TABS: 5.0000 mg | ORAL_TABLET | Freq: Three times a day (TID) | ORAL | Status: DC | PRN

## 2013-03-14 MED ORDER — BUPIVACAINE HCL (PF) 0.5 % IJ SOLN
INTRAMUSCULAR | Status: AC
  Filled 2013-03-14: qty 30

## 2013-03-14 MED ORDER — HYDROCODONE-ACETAMINOPHEN 5-325 MG PO TABS: 1.0000 | ORAL_TABLET | Freq: Four times a day (QID) | ORAL | Status: DC | PRN

## 2013-03-14 MED ORDER — ONDANSETRON HCL 4 MG PO TABS: 4.0000 mg | ORAL_TABLET | Freq: Four times a day (QID) | ORAL | Status: DC | PRN

## 2013-03-14 MED ORDER — DOXYCYCLINE HYCLATE 50 MG PO CAPS: 100.0000 mg | ORAL_CAPSULE | Freq: Two times a day (BID) | ORAL | Status: DC

## 2013-03-14 MED ORDER — OXYCODONE HCL 5 MG/5ML PO SOLN: 5.0000 mg | Freq: Once | ORAL | Status: DC | PRN

## 2013-03-14 MED ORDER — PROMETHAZINE HCL 25 MG/ML IJ SOLN: 6.2500 mg | INTRAMUSCULAR | Status: DC | PRN

## 2013-03-14 MED ORDER — OXYCODONE HCL 5 MG PO TABS: 5.0000 mg | ORAL_TABLET | Freq: Once | ORAL | Status: DC | PRN

## 2013-03-14 MED ORDER — ONDANSETRON HCL 4 MG/2ML IJ SOLN: 4.0000 mg | Freq: Four times a day (QID) | INTRAMUSCULAR | Status: DC | PRN

## 2013-03-14 MED ORDER — PROPOFOL INFUSION 10 MG/ML OPTIME
INTRAVENOUS | Status: DC | PRN
  Administered 2013-03-14: 100 ug/kg/min via INTRAVENOUS

## 2013-03-14 MED ORDER — MEPERIDINE HCL 25 MG/ML IJ SOLN: 6.2500 mg | INTRAMUSCULAR | Status: DC | PRN

## 2013-03-14 MED ORDER — 0.9 % SODIUM CHLORIDE (POUR BTL) OPTIME
TOPICAL | Status: DC | PRN
  Administered 2013-03-14: 100 mL

## 2013-03-14 MED ORDER — MIDAZOLAM HCL 5 MG/5ML IJ SOLN
INTRAMUSCULAR | Status: DC | PRN
  Administered 2013-03-14: 2 mg via INTRAVENOUS

## 2013-03-14 MED ORDER — ROPIVACAINE HCL 5 MG/ML IJ SOLN
INTRAMUSCULAR | Status: AC
  Filled 2013-03-14: qty 30

## 2013-03-14 MED ORDER — HYDROMORPHONE HCL PF 1 MG/ML IJ SOLN: 0.2500 mg | INTRAMUSCULAR | Status: DC | PRN

## 2013-03-14 MED ORDER — METOCLOPRAMIDE HCL 5 MG/ML IJ SOLN: 5.0000 mg | Freq: Three times a day (TID) | INTRAMUSCULAR | Status: DC | PRN

## 2013-03-14 SURGICAL SUPPLY — 25 items
BAG ZIPLOCK 12X15 (MISCELLANEOUS) ×2 IMPLANT
BANDAGE GAUZE ELAST BULKY 4 IN (GAUZE/BANDAGES/DRESSINGS) ×4 IMPLANT
BLADE SURG SZ10 CARB STEEL (BLADE) IMPLANT
BNDG COHESIVE 4X5 TAN STRL (GAUZE/BANDAGES/DRESSINGS) ×2 IMPLANT
CLOTH BEACON ORANGE TIMEOUT ST (SAFETY) ×2 IMPLANT
DRAPE U-SHAPE 47X51 STRL (DRAPES) ×2 IMPLANT
DRSG EMULSION OIL 3X16 NADH (GAUZE/BANDAGES/DRESSINGS) ×2 IMPLANT
DRSG PAD ABDOMINAL 8X10 ST (GAUZE/BANDAGES/DRESSINGS) ×2 IMPLANT
DURAPREP 26ML APPLICATOR (WOUND CARE) ×2 IMPLANT
ELECT REM PT RETURN 9FT ADLT (ELECTROSURGICAL) ×2
ELECTRODE REM PT RTRN 9FT ADLT (ELECTROSURGICAL) ×1 IMPLANT
GLOVE BIOGEL PI IND STRL 8.5 (GLOVE) ×1 IMPLANT
GLOVE BIOGEL PI INDICATOR 8.5 (GLOVE) ×1
GLOVE SURG ORTHO 9.0 STRL STRW (GLOVE) ×2 IMPLANT
KIT BASIN OR (CUSTOM PROCEDURE TRAY) ×2 IMPLANT
MANIFOLD NEPTUNE II (INSTRUMENTS) ×2 IMPLANT
NS IRRIG 1000ML POUR BTL (IV SOLUTION) ×2 IMPLANT
PACK LOWER EXTREMITY WL (CUSTOM PROCEDURE TRAY) ×2 IMPLANT
POSITIONER SURGICAL ARM (MISCELLANEOUS) ×2 IMPLANT
SPONGE GAUZE 4X4 12PLY (GAUZE/BANDAGES/DRESSINGS) ×2 IMPLANT
SUCTION FRAZIER 12FR DISP (SUCTIONS) ×2 IMPLANT
SUT ETHILON 2 0 PSLX (SUTURE) ×4 IMPLANT
SWAB COLLECTION DEVICE MRSA (MISCELLANEOUS) IMPLANT
TUBE ANAEROBIC SPECIMEN COL (MISCELLANEOUS) IMPLANT
WATER STERILE IRR 1500ML POUR (IV SOLUTION) IMPLANT

## 2013-03-14 NOTE — H&P (View-Only) (Signed)
Patient ID: Logan [name], male   DOB: 01/27/1951, 62 y.o.   MRN: [id number] Will plan for amputation of the right great toe at the MTP joint Thursday after 5 PM. Patient to be n.p.o. after midnight tonight. 

## 2013-03-14 NOTE — Interval H&P Note (Signed)
History and Physical Interval Note:  03/14/2013 5:33 PM  Logan [name]  has presented today for surgery, with the diagnosis of osteomyelitis right great toe  The various methods of treatment have been discussed with the patient and family. After consideration of risks, benefits and other options for treatment, the patient has consented to  Procedure(s) with comments: Right Great Toe Amputation (Right) - Right Great Toe Amputation as a surgical intervention .  The patient's history has been reviewed, patient examined, no change in status, stable for surgery.  I have reviewed the patient's chart and labs.  Questions were answered to the patient's satisfaction.     DUDA,MARCUS V

## 2013-03-14 NOTE — Anesthesia Postprocedure Evaluation (Signed)
Anesthesia Post Note  Patient: Logan [name]  Procedure(s) Performed: Procedure(s) (LRB): Right Great Toe Amputation (Right)  Anesthesia type: MAC with ankle block  Patient location: PACU  Post pain: Pain level controlled  Post assessment: Post-op Vital signs reviewed  Last Vitals: BP 118/62  Pulse 60  Temp(Src) 36.3 C (Oral)  Resp 14  Ht 6\' 2"  (1.88 m)  Wt 184 lb (83.462 kg)  BMI 23.61 kg/m2  SpO2 97%  Post vital signs: Reviewed  Level of consciousness: awake  Complications: No apparent anesthesia complications

## 2013-03-14 NOTE — Op Note (Signed)
OPERATIVE REPORT  DATE OF SURGERY: 03/14/2013  PATIENT:  Logan [name],  61 y.o. male  PRE-OPERATIVE DIAGNOSIS:  osteomyelitis right great toe  POST-OPERATIVE DIAGNOSIS:  osteomyelitis right great toe  PROCEDURE:  Procedure(s): Right Great Toe Amputation  SURGEON:  Surgeon(s): Nadara Mustard, MD  ANESTHESIA:   regional  EBL:  min ML  SPECIMEN:  No Specimen  TOURNIQUET:    PROCEDURE DETAILS: Patient is a 62 year old gentleman with diabetic insensate neuropathy with osteomyelitis abscess ulceration right great toe. Patient has been treated with IV antibiotics this is localized infection to the toe and he presents at this time after being stabilized for the great toe amputation at the MTP joint. Risk and benefits were discussed including persistent infection nonhealing of the wound need for higher level amputation. Patient states he understands and wished to proceed at this time. Description of procedure patient was brought to the operating room after undergoing an ankle block. After adequate levels of anesthesia were obtained patient's right lower extremity was prepped using DuraPrep and draped into a sterile field. A fishmouth incision was made at the MTP joint. The toe was amputated through the MTP joint. Electrocautery was used for hemostasis. The wound was irrigated with normal saline. The skin was closed using 2-0 nylon. The wound was covered with Adaptic orthopedic sponges AB dressing Kerlix and Coban. Patient was taken to the PACU in stable condition.  PLAN OF CARE: Admit to inpatient   PATIENT DISPOSITION:  PACU - hemodynamically stable.   Nadara Mustard, MD 03/14/2013 6:00 PM

## 2013-03-14 NOTE — Progress Notes (Signed)
Patient may be discharged to home on Friday. Prescription written for doxycycline and prescription written for Vicodin. Postoperative shoe nonweightbearing on the right foot. He is to keep the dressing clean and dry until followup. I will followup in the office in 2 weeks.

## 2013-03-14 NOTE — Progress Notes (Signed)
pcr collected and sent to lab

## 2013-03-14 NOTE — Progress Notes (Signed)
Dr. Earl Gala called and notified of pt having a CBG of 309 with no HS insulin coverage. No additional insulin coverage was ordered. CBGs changed to q4 hours due to pt being NPO for surgery.

## 2013-03-14 NOTE — Progress Notes (Signed)
Received from PACU alert, oriented and verbalizes no pain.   IV in progress.  Right dressing is dry and intact, toes warm, pink in color.  Dinner ordered, beverage given to patient.

## 2013-03-14 NOTE — Anesthesia Preprocedure Evaluation (Addendum)
Anesthesia Evaluation  Patient identified by MRN, date of birth, ID band Patient awake    Reviewed: Allergy & Precautions, H&P , NPO status , Patient's Chart, lab work & pertinent test results  Airway Mallampati: II TM Distance: >3 FB Neck ROM: Full    Dental  (+) Edentulous Upper and Edentulous Lower   Pulmonary neg pulmonary ROS,          Cardiovascular hypertension, Pt. on medications and Pt. on home beta blockers + CAD and + Peripheral Vascular Disease Rhythm:Regular Rate:Normal     Neuro/Psych negative neurological ROS  negative psych ROS   GI/Hepatic negative GI ROS, Neg liver ROS,   Endo/Other  diabetes, Poorly Controlled, Type 2, Insulin Dependent  Renal/GU negative Renal ROS     Musculoskeletal negative musculoskeletal ROS (+)   Abdominal   Peds  Hematology negative hematology ROS (+)   Anesthesia Other Findings   Reproductive/Obstetrics                          Anesthesia Physical Anesthesia Plan  ASA: III  Anesthesia Plan: Regional   Post-op Pain Management:    Induction:   Airway Management Planned:   Additional Equipment:   Intra-op Plan:   Post-operative Plan:   Informed Consent: I have reviewed the patients History and Physical, chart, labs and discussed the procedure including the risks, benefits and alternatives for the proposed anesthesia with the patient or authorized representative who has indicated his/her understanding and acceptance.   Dental advisory given  Plan Discussed with: CRNA  Anesthesia Plan Comments:         Anesthesia Quick Evaluation

## 2013-03-14 NOTE — Progress Notes (Signed)
Instructed patient regarding process in preparation for surgery today after 5pm.  Patient verbalized understanding.

## 2013-03-14 NOTE — Progress Notes (Signed)
Patient ID: Logan [name], male   DOB: 08-07-1950, 62 y.o.   MRN: [id number]  Subjective Logan [name] is a 62 year old man with a history of diabetes, CAD, HTN, hypercholesterolemia, hereditary hemochromatosis, syncope, and hearing loss with no history of previous diabetic foot ulcers currently on hospital day 4 for treatment of a diabetic foot ulcer with MRI evidence of osteomyelitis with plans to amputate the right great toe at the MTP joint later today.   No acute events overnight. On glucose checks yesterday evening, his CBG was 309. Given that he is NPO, no additional insulin coverage was ordered. His glucose did come down overnight to 278 at midnight, and 160 at 3 am this morning. This AM, the patient reports feeling a little diaphoretic in bed around 5:00 AM. He reports that this occurred while lying in bed, but resolved. He reports no tremulousness, dizziness, confusion, or changes in vision. No HA. His CBG at 8:00 AM was 75. He continues to be able to walk around the hospital floor; no pain on ambulation. He also reports that over his hospital stay, he has developed a rash on his back that is itchy. The itch is 3/10 constant when his back is touching the bed or chair. Not affected by showering. He reports that this is not like other occasional itches or poison oak itches he has experienced in the past. He had chicken pox in the past, and has not had a shingles vaccine.   Yesterday evening, Patient's SO reported that he has been having abdominal pain follow meals with any fat content. His sister has GB disease and there is a concern as whether he has GB disease.   Consults: --Anesthesia, Dr. Renold Don  saw the patient and discussed anesthesia options with him. Plans to do regional anesthesia for the toe amputation.  --Orthopedics, Dr. Lajoyce Corners, plans to do the amputation later today.  -- Diabetes care coordinator, Pamala Hurry saw the patient and recommends increased lantus to 22 units QHS to match his  home insulin dose of 17 units of 70/30 BID + 12 units of lantus QHS. Also reccomends novolog 6 units TID with meals if patient eats >50% of meals.  --Pharmacy c/s with Lynann Beaver recommends that Vancomycin trough level is in therapeutic range, and recommends to continue IV vancomycin 1 g Q8 hours.    ROS:  General: No fevers, chills. Some sweats this morning.  CV: No chest pain. No SOB.  Resp: No coughing or wheezing.  MSK: As per HPI.  Neuro: As per HPI. No tingling or numbness.  Psych: mood is bored, but jovial.   Objective Filed Vitals:   03/14/13 0537  BP: 141/70  Pulse: 58  Temp: 97.6 F (36.4 C)  Resp:    General: Cooperative man sitting in hospital clothes in his chair. No acute distress.  CV: RRR Resp: CTAB.  HEENT: Oropharynx is clear with no erythema or mucosal lesions. Conjunctiva are normal in color. No swelling noted around oropharynx of eyes.  Back: Scattered 1-2 mm vesicles on erythematous bases across back bilaterally. Some are crusted over with scabs. No exudates.  Extremities: Right foot is still very swollen, but continues to improve. Edema now extends to ankle, but no further. Tenderness to palpation begins at about 1 inch proximal to the MTP and extends distally. DP is not palpable likely secondary to swelling. The ulcer is has remained stagnant and size, and is weeping a yellow-clear fluid.   Scheduled Meds: . amLODipine  10 mg  Oral Daily  . aspirin EC  81 mg Oral BID  . chlorhexidine  60 mL Topical Once  . enoxaparin (LOVENOX) injection  40 mg Subcutaneous Q24H  . insulin aspart  0-15 Units Subcutaneous Q4H  . insulin glargine  12 Units Subcutaneous QHS  . levofloxacin (LEVAQUIN) IV  750 mg Intravenous Q24H  . levothyroxine  75 mcg Oral QAC breakfast  . losartan  100 mg Oral Daily  . metoprolol  75 mg Oral Daily  . simvastatin  20 mg Oral q1800  . vancomycin  1,000 mg Intravenous Q8H   Continuous Infusions: . sodium chloride 50 mL/hr (03/13/13  1829)   PRN Meds:.acetaminophen, acetaminophen, cloNIDine, ondansetron (ZOFRAN) IV, ondansetron  Labs:   CMP 03/13/13    Component Value Date/Time   NA 133* 03/13/2013 0800   K 4.3 03/13/2013 0800   CL 97 03/13/2013 0800   CO2 27 03/13/2013 0800   GLUCOSE 304* 03/13/2013 0800   BUN 18 03/13/2013 0800   CREATININE 1.15 03/13/2013 0800   CALCIUM 9.0 03/13/2013 0800   PROT 7.2 03/11/2013 1957   ALBUMIN 3.3* 03/11/2013 1957   AST 18 03/11/2013 1957   ALT 14 03/11/2013 1957   ALKPHOS 171* 03/11/2013 1957   BILITOT 0.6 03/11/2013 1957   GFRNONAA 66* 03/13/2013 0800   GFRAA 77* 03/13/2013 0800   CBC 03/11/13    Component Value Date/Time   WBC 9.4 03/11/2013 1957   RBC 4.20* 03/11/2013 1957   HGB 13.4 03/11/2013 1957   HCT 38.1* 03/11/2013 1957   PLT 276 03/11/2013 1957   MCV 90.7 03/11/2013 1957   MCH 31.9 03/11/2013 1957   MCHC 35.2 03/11/2013 1957   RDW 13.3 03/11/2013 1957   LYMPHSABS 1.9 03/11/2013 1957   MONOABS 1.2* 03/11/2013 1957   EOSABS 0.2 03/11/2013 1957   BASOSABS 0.0 03/11/2013 1957   Vancomycin trough level 03/13/13: 18.3   A1c 03/11/13: 8.9  Imaging:   MRI of Right Foot, 03/11/13:  IMPRESSION:  1. Terminal phalanx great toe osteomyelitis with sinus tract  extending the medial plantar aspect of the great toe.  2. Great toe IP joint effusion which may be reactive or represent  septic arthritis.   Ankle Brachial Index, 03/12/13:  Ankle blood pressures are elevated--consistent with calcified vessels.   Assessment & Plan:  Logan [name] is a 62 year old man with a history of diabetes who is currently on hospital day 3 for treatment of a diabetic foot ulcer with MRI evidence of osteomyelitis.   1. . Diabetic Foot Ulcer/Osteomyelitis: Ulcer appears to be Wagner scale grade 2-3 with MRI evidence of osteomyelitis. Orthopedic surgery suggests amputation of great toe phalange is necessary. Vancomycin trough level within therapeutic window on 03/13/13 at 18.3. With vancomycin, BUN/Crt measured at  baseline and are normal; labs from 10/8 show slight increase in Crt from 1.08 to 1.15.  -- Continue IV vancomycin 1g Q8 hours and IV levofloxacin 750 mg q 24 hrs.  -- Will continue monitoring creatinine as vancomycin continues.  -- Orthopedic surgery plans to do right great toe amputation at MTP joint. After amputation, IV antibiotics can likely be discontinued within 24-48 hours pending development of any complications. -- Patient made NPO starting at 00:00 on 03/14/13   2. Diabetes: He has a history of poor glucose control with most recent A1c of 8.9, some peripheral neuropathy and diminished sharp/dull discrimination over the plantar aspects of both lower extremities. Patient has been offered meal time short-acting insulin the past and declined; will  continue to encourage patient to accept meal-time insulin at home for improved glucose control. His blood sugars in the hospital have been elevated from 227-259 throughout the day; however, his FBP on the morning of 10/7 was 99, so increasing the dose of lantus may precipitate hypoglycemia. 3 AM blood glucose check on 10/8 showed CBG of 160, so somogyi effect is not likely contributing. -- Continue insulin lantus 12 units QHS. May consider increasing this per dietician recommendations to lantus 22 QHS given patient's frequent hyperglycemia; however given patient's NPO diet throughout the day today, will hold off on making changes today.  -- Continue insulin novolog 6 units TID with meals--- hold if patient eats <50% of meal.  -- Continue losartan 100 mg daily for kidney protection.   3. HTN, Hypercholesterolemia: Some high pressures while in the hospital (highest since admission is 170/78, with more common BPs in the 150s/50s-60s). Prior to admission last office visit was in January 2014 and showed good blood pressure control at 124/68. Given his comorbidity with diabetes, his target SBP should be 130s. As an outpatient,he takes clonidine 0.1 mg as needed  for SBP >180. Patient reports taking the clonidine about 4 times in the past 8 months. Reports no HA or ability to tell when his BP is high. Will continue this in the hospital.  -- Continue amlodipine 10 mg daily  -- Continue metoprolol 75 mg daily  -- Continue simvastatin 20 mg  -- Continue clonidine 0.1 mg Q 24 hrs prn for SBP >180 mm Hg   4. Hypothyroidism:  -- Continue levothyroxine 75 mcg daily.   5. DVT prophylaxis: Patient is ambulating well.  -- Continue enoxaparin 40 mg [place] q 24 hours   6. Itchy, 1-2 mm vesicular rash with erythematous bases scatted across bilaterally upper back. No systemic symptoms of HoTN, SOB, or diffuse pruritus. Given non-dermatomal distribution, is unlikely VZV. Given current treatment with vancomycin and levofloxacin, is most likely a drug reaction to vancomycin. Because this has developed over several days and has not been associated with flushing or dizziness, is not likely vancomycin "red man syndrome." There is no evidence of necrosis or mucosal lesions, so SJS or TEN is very unlikely. There is no diffuse erythema to suggest erythroderma or DRESS syndrome. May be related to drug-induced eosinophilia.  -- May consider d/c vancomycin and replace with clindamycin. Since he will likely be able to discharge with an oral antibiotic after rehabilitation from the surgery, changing to an antibiotic with an oral form may be beneficial regardless of the rash. May also consider symptomatic treatment with benadryl given that the rash isn't accompanied by any symptoms suggesting a serious adverse reaction.   7. Possible GB disease. Will obtain GB US to work up possibility of cholelithiasis. Given lack of severe pain, fever, and signs of jaundice or increasing LFTs to suggest choledocolithiasis or cholecystitis.  -- Will obtain GB US.   8. Safety/Dispo: Orthopedic surgery will schedule amputation. After amputation, PT/OT c/s may be required to aid in ambulation without the  right great toe.   Patient seen and examined and I agree with the above.  Patient seen in the PM post-op: no pain but a little groggy.

## 2013-03-14 NOTE — Transfer of Care (Signed)
Immediate Anesthesia Transfer of Care Note  Patient: Logan [name]  Procedure(s) Performed: Procedure(s) (LRB): Right Great Toe Amputation (Right)  Patient Location: PACU  Anesthesia Type: Regional  Level of Consciousness: sedated, patient cooperative and responds to stimulation  Airway & Oxygen Therapy: Patient Spontanous Breathing and Patient connected to face mask oxgen  Post-op Assessment: Report given to PACU RN and Post -op Vital signs reviewed and stable  Post vital signs: Reviewed and stable  Complications: No apparent anesthesia complications

## 2013-03-15 ENCOUNTER — Encounter (HOSPITAL_COMMUNITY): Payer: Self-pay | Admitting: Orthopedic Surgery

## 2013-03-15 DIAGNOSIS — E109 Type 1 diabetes mellitus without complications: Secondary | ICD-10-CM

## 2013-03-15 DIAGNOSIS — M869 Osteomyelitis, unspecified: Secondary | ICD-10-CM

## 2013-03-15 DIAGNOSIS — I10 Essential (primary) hypertension: Secondary | ICD-10-CM

## 2013-03-15 LAB — GLUCOSE, CAPILLARY
Glucose-Capillary: 120 mg/dL — ABNORMAL HIGH (ref 70–99)
Glucose-Capillary: 207 mg/dL — ABNORMAL HIGH (ref 70–99)

## 2013-03-15 MED ORDER — HYDROCODONE-ACETAMINOPHEN 5-325 MG PO TABS: 1.0000 | ORAL_TABLET | Freq: Four times a day (QID) | ORAL | Status: DC | PRN

## 2013-03-15 NOTE — Progress Notes (Signed)
03/15/13 1038  PT Visit Information  Last PT Received On 03/15/13  Assistance Needed +1  History of Present Illness s/p right great toe amputation  PT Time Calculation  PT Start Time 1025  PT Stop Time 1034  PT Time Calculation (min) 9 min  Subjective Data  Patient Stated Goal home  Precautions  Precautions Fall  Restrictions  RLE Weight Bearing NWB  Cognition  Arousal/Alertness Awake/alert  Behavior During Therapy Restless  Overall Cognitive Status Within Functional Limits for tasks assessed  Bed Mobility  Bed Mobility Not assessed  Transfers  Transfers Sit to Stand;Stand to Sit  Sit to Stand 5: Supervision;With upper extremity assist;From chair/3-in-1  Stand to Sit 5: Supervision;To chair/3-in-1;With upper extremity assist  Details for Transfer Assistance verbal cues for hand placement and overall safety  Ambulation/Gait  Ambulation/Gait Assistance 5: Supervision  Ambulation Distance (Feet) 25 Feet  Assistive device Rolling walker  Ambulation/Gait Assistance Details verbal cues for safety  Stairs Yes  Stairs Assistance 4: Min assist  Stairs Assistance Details (indicate cue type and reason) cues for sequence and safety  Stair Management Technique Two rails  Number of Stairs 3  PT - End of Session  Equipment Utilized During Treatment Gait belt  Activity Tolerance Patient tolerated treatment well  Patient left in chair;with call bell/phone within reach  Nurse Communication Mobility status  PT - Assessment/Plan  PT Plan Current plan remains appropriate  Follow Up Recommendations Home health PT  PT equipment Rolling walker with 5" wheels  PT Goal Progression  Progress towards PT goals Progressing toward goals  Acute Rehab PT Goals  Time For Goal Achievement 03/18/13  Potential to Achieve Goals Good  PT General Charges  $$ ACUTE PT VISIT 1 Procedure  PT Treatments  $Gait Training 8-22 mins

## 2013-03-15 NOTE — Progress Notes (Addendum)
Hypoglycemic Event  CBG: 67  Treatment: 4oz gatorade  Symptoms: none  Follow-up CBG: Time: 0753 CBG Result: 120  Possible Reasons for Event: Lack of intake  Comments/MD notified:    Ninfa Linden  Remember to initiate Hypoglycemia Order Set & complete

## 2013-03-15 NOTE — Progress Notes (Signed)
Subjective: Feeling well post op  Objective: Lab: No results found for this basename: WBC, NEUTROABS, HGB, HCT, MCV, PLT,  in the last 72 hours  Recent Labs  03/13/13 0800 03/14/13 1040  NA 133*  --   K 4.3  --   CL 97  --   GLUCOSE 304*  --   BUN 18  --   CREATININE 1.15 1.01  CALCIUM 9.0  --     Imaging:  Scheduled Meds: . amLODipine  10 mg Oral Daily  . aspirin EC  81 mg Oral BID  . enoxaparin (LOVENOX) injection  40 mg Subcutaneous Q24H  . insulin aspart  0-15 Units Subcutaneous Q4H  . insulin glargine  12 Units Subcutaneous QHS  . levofloxacin (LEVAQUIN) IV  750 mg Intravenous Q24H  . levothyroxine  75 mcg Oral QAC breakfast  . losartan  100 mg Oral Daily  . metoprolol  75 mg Oral Daily  . simvastatin  20 mg Oral q1800  . vancomycin  1,000 mg Intravenous Q8H   Continuous Infusions: . sodium chloride 50 mL/hr (03/14/13 2013)   PRN Meds:.acetaminophen, acetaminophen, cloNIDine, metoCLOPramide (REGLAN) injection, metoCLOPramide, ondansetron (ZOFRAN) IV, ondansetron (ZOFRAN) IV, ondansetron, ondansetron   Physical Exam: Filed Vitals:   03/15/13 0522  BP: 162/76  Pulse: 57  Temp: 98.1 F (36.7 C)  Resp: 16   See d/c summary     Assessment/Plan: For d/c home. Home health F-F done Dictated #[id number]   [facility] (o) [phone]; (c) [phone] Call-grp - [facility]  Tele: [phone]  03/15/2013, 10:09 AM  '

## 2013-03-15 NOTE — Discharge Summary (Signed)
NAMECEPHAS, Logan NO.:  [id number]  MEDICAL RECORD NO.:  [id number]  LOCATION:  1504                         FACILITY:  [facility]  PHYSICIAN:  Rosalyn Gess. Shawan Corella, MD  DATE OF BIRTH:  10-Jul-1950  DATE OF ADMISSION:  03/11/2013 DATE OF DISCHARGE:  03/15/2013                              DISCHARGE SUMMARY   DISPOSITION:  The patient discharged to home on March 15, 2013.  ADMITTING DIAGNOSES: 1. Diabetic toe ulcer, great toe, right foot. 2. Insulin-dependent diabetes.  DISCHARGE DIAGNOSES: 1. Osteomyelitis, distal phalanx, right great toe. 2. Diabetes, stable.  PROCEDURES:  MRI of the foot, performed on October 6, read out as showing osteomyelitis of the terminal phalanx with a sinus tract extending into the medial plantar aspect of the great toe.  Great toe IP joint effusion, which may be reactive or represent septic arthritis.  SURGICAL PROCEDURE:  The patient had right great toe amputation.  Please see the full operative port.  HISTORY OF PRESENT ILLNESS:  [name] is a 62 year old gentleman with type 1 insulin-dependent diabetes.  About one month ago, the patient noticed a callus on the plantar aspect of the surface of his right great toe, which seemed to be coming loose.  He pulled it off and this pulled it down into the meaty section.  He cleaned this wound and stated at that time it was about the size of a pencil eraser, but it started to look infected.  This has continued to be a problem.  Now, the patient presents with a red, swollen, and tender right great toe with erythema and swelling extending to his ankle.  The patient reports he has increasing edema over the course of the day.  He has had low-grade fevers up to 100 in the evening prior to admission.  There has been a minimal yellowish white discharge on bandage.  The patient reports he has had some chills, but no rigors.  He has been treated with a purple herbal medication on the wound.  The  patient was subsequently admitted with probable osteomyelitis of the toe.    Please see the H and P for past medical history, family history, and social history.  HOSPITAL COURSE: 1. Osteomyelitis, right great toe.  The patient was admitted to the     hospital.  MRI was performed, which revealed that he had     osteomyelitis confirmed.  He was seen by Dr. Aldean Baker in     consultation.  The patient had been treated with IV antibiotics     with vancomycin and Levaquin.  The patient was taken to the operating theater on October 9, and had amputation of the right great toe that was uneventful.  Dr. Lajoyce Corners felt the patient was stable and ready for discharge to home on the first postoperative day.  The patient was feeling well, doing well.  He was hemodynamically stable.  He has been seen by Physical Therapy, who does recommend the patient have short-term home health PT as well as RN visits to monitor his wound dressing.  2. Diabetes.  The patient was continued on a sliding scale during his     acute hospitalization.  He did have 1 episode of hypoglycemia that     responded to oral hyperglycemic beverage.  Plan is for the patient     to return to his home regimen.  DISCHARGE EXAMINATION:  VITAL SIGNS:  Temperature was 98.1, blood pressure 162/76, heart rate was 57, respirations 16, O2 sats 95%. GENERAL APPEARANCE:  This is a slender gentleman, sitting in a Geri chair.  He is in no acute distress.  HEENT:  Unremarkable, conjunctivae and sclerae being clear. PULMONARY:  The patient is moving air well with no rales, wheezes, or rhonchi.  No increased work of breathing. CARDIOVASCULAR:  The patient has 2+ radial pulses.  Precordium is quiet. He has regular rate and rhythm. ABDOMEN:  Soft. EXTREMITIES:  The patient has a post surgical dressing on his right foot.  He has pink toes that are warm.  There is no ascending erythema or tenderness above the bandage. NEURO:  The patient is awake,  alert.  He is oriented to person, place, time, and context.  DISCHARGE MEDICATIONS: 1. Amlodipine 10 mg daily, to be continued. 2. Aspirin 81 mg daily. 3. Clonidine 0.1 mg daily as needed for elevated systolic pressures. 4. Doxycycline 100 mg b.i.d. for 10 days. 5. Lantus 12 units at bedtime. 6. Novolin 70/30, 12 units b.i.d. 7. Levothyroxine 75 mcg daily. 8. Losartan 100 mg daily. 9. Metoprolol 50 mg 1-1/2 tablets (75 mg) daily. 10.Simvastatin 20 mg daily.  DISPOSITION:  The patient to be discharged to home.  He is to have crutches for home use.  He is to do nonweightbearing.  He needs to have a postop shoe.  These 3 things ordered by Dr. Aldean Baker.  The patient will be referred for home health therapy as noted above, and face-to-face encounter is documented in his discharge note and chart.  The patient's condition at time of discharge dictation is medically stable and surgically stable.  He will follow up with Dr. Aldean Baker in 2 weeks as scheduled.  He will return to see Dr. Debby Bud as needed.     Rosalyn Gess Holli Rengel, MD     MEN/MEDQ  D:  03/15/2013  T:  03/15/2013  Job:  [id number]  cc:   Nadara Mustard, MD Fax: [phone]

## 2013-03-15 NOTE — Evaluation (Signed)
Physical Therapy Evaluation Patient Details Name: Logan [name] MRN: [id number] DOB: 1950-07-18 Today's Date: 03/15/2013 Time: 0940-1005 PT Time Calculation (min): 25 min  PT Assessment / Plan / Recommendation History of Present Illness  s/p right great toe amputation  Clinical Impression  Pt  Will benefit from PT for gait training session and f/u HHPT    PT Assessment  Patient needs continued PT services    Follow Up Recommendations  Home health PT    Does the patient have the potential to tolerate intense rehabilitation      Barriers to Discharge        Equipment Recommendations  Rolling walker with 5" wheels    Recommendations for Other Services     Frequency Min 6X/week    Precautions / Restrictions Precautions Precautions: Fall Restrictions Weight Bearing Restrictions: Yes RLE Weight Bearing: Non weight bearing   Pertinent Vitals/Pain 0/10      Mobility  Bed Mobility Bed Mobility: Supine to Sit Supine to Sit: 5: Supervision Details for Bed Mobility Assistance: for safety; mildly impulsive Transfers Transfers: Sit to Stand;Stand to Sit Sit to Stand: From toilet;From bed Stand to Sit: To toilet;To chair/3-in-1 Details for Transfer Assistance: verbal cues for hand placement and overall safety Ambulation/Gait Ambulation/Gait Assistance: 4: Min guard Ambulation Distance (Feet): 120 Feet Assistive device: Rolling walker Ambulation/Gait Assistance Details: verbal cues for sequnce, RW safety and NWB    Exercises     PT Diagnosis: Difficulty walking  PT Problem List: Decreased balance;Decreased mobility;Decreased knowledge of precautions;Decreased knowledge of use of DME PT Treatment Interventions: DME instruction;Gait training;Stair training;Functional mobility training;Therapeutic activities;Patient/family education     PT Goals(Current goals can be found in the care plan section) Acute Rehab PT Goals Patient Stated Goal: home PT Goal Formulation:  With patient Time For Goal Achievement: 03/18/13 Potential to Achieve Goals: Good  Visit Information  Last PT Received On: 03/15/13 Assistance Needed: +1 History of Present Illness: s/p right great toe amputation       Prior Functioning  Home Living Family/patient expects to be discharged to:: Private residence Living Arrangements: Spouse/significant other Type of Home: House Home Access: Stairs to enter Secretary/administrator of Steps: 2 Entrance Stairs-Rails: Right;Left;Can reach both Home Layout: One level Home Equipment: None Additional Comments: pt ssignificant other works " staggered hrs" at [employer] Prior Function Level of Independence: Independent Communication Communication: No difficulties    Cognition  Cognition Arousal/Alertness: Awake/alert Behavior During Therapy: Restless Overall Cognitive Status: Within Functional Limits for tasks assessed    Extremity/Trunk Assessment Upper Extremity Assessment Upper Extremity Assessment: Overall WFL for tasks assessed Lower Extremity Assessment Lower Extremity Assessment: Overall WFL for tasks assessed;RLE deficits/detail RLE Deficits / Details: right foot bandage; pt moves ankle well   Balance Static Standing Balance Static Standing - Balance Support: Bilateral upper extremity supported;During functional activity (NWB on RLE) Static Standing - Level of Assistance: 4: Min assist;5: Stand by assistance  End of Session PT - End of Session Activity Tolerance: Patient tolerated treatment well Patient left: in chair;with call bell/phone within reach Nurse Communication: Mobility status  GP     [facility] 03/15/2013, 10:07 AM

## 2013-03-28 DIAGNOSIS — E1169 Type 2 diabetes mellitus with other specified complication: Secondary | ICD-10-CM

## 2013-03-28 DIAGNOSIS — Z4789 Encounter for other orthopedic aftercare: Secondary | ICD-10-CM

## 2013-03-28 DIAGNOSIS — G609 Hereditary and idiopathic neuropathy, unspecified: Secondary | ICD-10-CM

## 2013-03-28 DIAGNOSIS — M9080 Osteopathy in diseases classified elsewhere, unspecified site: Secondary | ICD-10-CM

## 2013-04-04 ENCOUNTER — Telehealth: Payer: Self-pay | Admitting: Internal Medicine

## 2013-04-04 NOTE — Telephone Encounter (Signed)
Tried the [employer] number - it did not work!  The agency needs to provide me the case file # on the claim. I need a working number to call.  Thanks

## 2013-04-04 NOTE — Telephone Encounter (Signed)
To complete the peer to peer process call [employer] [phone] ext [phone]

## 2013-04-04 NOTE — Telephone Encounter (Signed)
04/04/2013  Jay Schlichter, [facility], [phone]  ext. [phone], left message regarding pt's home care visits for wound care for amputation.  [employer] has denied home care visits unless a Peer-To-Peer is completed.  Please contact Almira Coaster for more info.

## 2013-04-04 NOTE — Telephone Encounter (Signed)
I called Logan [name] back. She states after you dial the number [phone] and you hear someone start speaking press # and then ext [phone]. Case file # is [id number]

## 2013-04-11 ENCOUNTER — Other Ambulatory Visit: Payer: Self-pay

## 2013-05-28 ENCOUNTER — Other Ambulatory Visit: Payer: Self-pay | Admitting: Internal Medicine

## 2013-08-14 ENCOUNTER — Encounter: Payer: Self-pay | Admitting: Internal Medicine

## 2013-08-14 ENCOUNTER — Other Ambulatory Visit (INDEPENDENT_AMBULATORY_CARE_PROVIDER_SITE_OTHER): Payer: BC Managed Care – PPO

## 2013-08-14 ENCOUNTER — Ambulatory Visit (INDEPENDENT_AMBULATORY_CARE_PROVIDER_SITE_OTHER): Payer: BC Managed Care – PPO | Admitting: Internal Medicine

## 2013-08-14 VITALS — BP 160/64 | HR 56 | Temp 96.7°F | Wt 184.4 lb

## 2013-08-14 DIAGNOSIS — E7800 Pure hypercholesterolemia, unspecified: Secondary | ICD-10-CM

## 2013-08-14 DIAGNOSIS — I10 Essential (primary) hypertension: Secondary | ICD-10-CM

## 2013-08-14 DIAGNOSIS — E109 Type 1 diabetes mellitus without complications: Secondary | ICD-10-CM

## 2013-08-14 DIAGNOSIS — I2510 Atherosclerotic heart disease of native coronary artery without angina pectoris: Secondary | ICD-10-CM

## 2013-08-14 LAB — IBC PANEL
Iron: 152 ug/dL (ref 42–165)
Saturation Ratios: 52.1 % — ABNORMAL HIGH (ref 20.0–50.0)
Transferrin: 208.5 mg/dL — ABNORMAL LOW (ref 212.0–360.0)

## 2013-08-14 LAB — BASIC METABOLIC PANEL
BUN: 13 mg/dL (ref 6–23)
CHLORIDE: 101 meq/L (ref 96–112)
CO2: 31 mEq/L (ref 19–32)
CREATININE: 1.1 mg/dL (ref 0.4–1.5)
Calcium: 9.3 mg/dL (ref 8.4–10.5)
GFR: 73.51 mL/min (ref >60.00)
Glucose, Bld: 127 mg/dL — ABNORMAL HIGH (ref 70–99)
Potassium: 3.5 mEq/L (ref 3.5–5.1)
Sodium: 139 mEq/L (ref 135–145)

## 2013-08-14 LAB — LIPID PANEL
CHOLESTEROL: 151 mg/dL (ref 0–200)
HDL: 44.8 mg/dL (ref >39.00)
LDL Cholesterol: 91 mg/dL (ref 0–99)
Total CHOL/HDL Ratio: 3
Triglycerides: 78 mg/dL (ref 0.0–149.0)
VLDL: 15.6 mg/dL (ref 0.0–40.0)

## 2013-08-14 LAB — HEPATIC FUNCTION PANEL
ALBUMIN: 4.4 g/dL (ref 3.5–5.2)
ALT: 28 U/L (ref 0–53)
AST: 24 U/L (ref 0–37)
Alkaline Phosphatase: 149 U/L — ABNORMAL HIGH (ref 39–117)
BILIRUBIN DIRECT: 0.2 mg/dL (ref 0.0–0.3)
Total Bilirubin: 1.1 mg/dL (ref 0.3–1.2)
Total Protein: 7.5 g/dL (ref 6.0–8.3)

## 2013-08-14 LAB — HM DIABETES EYE EXAM

## 2013-08-14 LAB — HEMOGLOBIN AND HEMATOCRIT, BLOOD
HCT: 44.8 % (ref 39.0–52.0)
Hemoglobin: 15.3 g/dL (ref 13.0–17.0)

## 2013-08-14 LAB — HEMOGLOBIN A1C: HEMOGLOBIN A1C: 10 % — AB (ref 4.6–6.5)

## 2013-08-14 NOTE — Progress Notes (Signed)
Pre visit review using our clinic review tool, if applicable. No additional management support is needed unless otherwise documented below in the visit note. 

## 2013-08-16 MED ORDER — ATORVASTATIN CALCIUM 40 MG PO TABS: 40.0000 mg | ORAL_TABLET | Freq: Every day | ORAL | Status: DC

## 2013-08-17 NOTE — Assessment & Plan Note (Signed)
BP Readings from Last 3 Encounters:  08/14/13 160/64  03/15/13 162/76  03/15/13 162/76   Consistently running high on 3 drug regimen: ARB, CCB and peripheral dilator  Plan At next OV need to discuss adding diuretic

## 2013-08-17 NOTE — Progress Notes (Signed)
Subjective:    Patient ID: Logan [name], male    DOB: [birth year], 63 y.o.   MRN: [id number]  HPI Mr. Mandeville presents for follow up. He reports that he has been feeling well but does admit to poor adherence to his diabetic regimen and suspects his A1C will be out of control!  Past Medical History  Diagnosis Date  . Hypertension   . Diabetes mellitus   . Hearing loss   . CAD (coronary artery disease)   . Hemochromatosis   . Hyperlipidemia   . Diabetic retinopathy    Past Surgical History  Procedure Laterality Date  . Inner ear surgery      right  . Amputation 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    Procedure: Right Great Toe Amputation;  Surgeon: Newt Minion, MD;  Location: [facility];  Service: Orthopedics;  Laterality: Right;  Right Great Toe Amputation   Family History  Problem Relation Age of Onset  . Asthma Mother   . Heart failure Mother   . Emphysema Mother   . Alcohol abuse Father   . Cancer Father     prostate  . Stroke Father   . Heart disease Neg Hx   . Colon cancer Neg Hx    History   Social History  . Marital Status: Widowed    Spouse Name: N/A    Number of Children: 1  . Years of Education: 12   Occupational History  . retired    Social History Main Topics  . Smoking status: Never Smoker   . Smokeless tobacco: Never Used  . Alcohol Use: No  . Drug Use: No  . Sexual Activity: Yes    Partners: Female   Other Topics Concern  . Not on file   Social History Narrative   HS[employer]. Married - '77 - '04. 1 son '80. Work - semi-retired. Has a girlfriend.    Current Outpatient Prescriptions on File Prior to Visit  Medication Sig Dispense Refill  . amLODipine (NORVASC) 10 MG tablet TAKE ONE TABLET BY MOUTH ONCE DAILY  90 tablet  3  . aspirin 81 MG tablet Take 81 mg by mouth 2 (two) times daily.       . cloNIDine (CATAPRES) 0.1 MG tablet Take 1 tablet (0.1 mg total) by mouth daily as needed. If BP is over 180  60 tablet  11  . doxycycline (VIBRAMYCIN) 50 MG capsule Take 2  capsules (100 mg total) by mouth 2 (two) times daily.  60 capsule  0  . HYDROcodone-acetaminophen (NORCO) 5-325 MG per tablet Take 1 tablet by mouth every 6 (six) hours as needed for pain.  30 tablet  0  . insulin glargine (LANTUS) 100 UNIT/ML injection Inject 0.12 mLs (12 Units total) into the skin at bedtime.  10 mL  5  . insulin NPH-regular (NOVOLIN 70/30) (70-30) 100 UNIT/ML injection Inject 17 Units into the skin 2 (two) times daily with a meal.      . levothyroxine (SYNTHROID, LEVOTHROID) 75 MCG tablet TAKE ONE TABLET BY MOUTH ONCE DAILY  90 tablet  3  . losartan (COZAAR) 100 MG tablet TAKE ONE TABLET BY MOUTH ONCE DAILY  90 tablet  3  . metoprolol (LOPRESSOR) 50 MG tablet TAKE ONE & ONE-HALF TABLETS BY MOUTH ONCE DAILY  135 tablet  3   No current facility-administered medications on file prior to visit.      Review of Systems System review is negative for any constitutional, cardiac, pulmonary, GI or neuro symptoms or  complaints other than as described in the HPI.     Objective:   Physical Exam Filed Vitals:   08/14/13 1557  BP: 160/64  Pulse: 56  Temp: 96.7 F (35.9 C)   BP Readings from Last 3 Encounters:  08/14/13 160/64  03/15/13 162/76  03/15/13 162/76   Wt Readings from Last 3 Encounters:  08/14/13 184 lb 6.4 oz (83.643 kg)  03/11/13 184 lb (83.462 kg)  03/11/13 184 lb (83.462 kg)   Gen'l- Tall and lanky man in no acute distress HEENT_ C&S cler Cor - 2+ radial, RRR Pulm - normal respirations Neuro - A&O x 3, normal gait and station       Assessment & Plan:

## 2013-08-17 NOTE — Assessment & Plan Note (Signed)
No complaints of cardiac symptoms - although he is a stoic.  Plan Continue to work on risk reduction treatments.

## 2013-08-17 NOTE — Assessment & Plan Note (Signed)
No phlebotomy for over a year. He is due for lab with information to be passed along to Dr. Stark.  Addendum: IBC panel - Fe 152, % Sat 52, transferrin 208                     Lab Results  Component Value Date   HGB 15.3 08/14/2013    

## 2013-08-17 NOTE — Assessment & Plan Note (Signed)
Taking and tolerating "statin" therapy. Last lab with LDL 91 - greater than goal of 80 in high risk patient with CAD. HDL is good.  Plan At next visit need to discuss increase in simvastatin vs change to crestor.

## 2013-08-17 NOTE — Patient Instructions (Signed)
DM - follow up A1C with recommendations per new Dr. Marland Kitchen Dr. Damita Dunnings

## 2013-08-17 NOTE — Assessment & Plan Note (Signed)
No phlebotomy for over a year. He is due for lab with information to be passed along to Dr. Fuller Plan.  Addendum: IBC panel - Fe 152, % Sat 52, transferrin 208                     Lab Results  Component Value Date   HGB 15.3 08/14/2013

## 2013-08-17 NOTE — Assessment & Plan Note (Signed)
Lab Results  Component Value Date   HGBA1C 10.0* 08/14/2013   Mr. Mitton has had a problem with hypoglycemia in the past. He does admit to dietary indiscretion.  Plan Will need to have careful adjustment of insulin along with better dietary adherence - will defer to his new PCP to insure close follow up.

## 2013-12-10 ENCOUNTER — Telehealth: Payer: Self-pay | Admitting: *Deleted

## 2013-12-10 DIAGNOSIS — E109 Type 1 diabetes mellitus without complications: Secondary | ICD-10-CM

## 2013-12-10 NOTE — Telephone Encounter (Signed)
Left message on machine for patient to schedule an appointment to establish care and follow up diabetes. A1c, bmet ordered Message sent to [facility] Diabetic bundle

## 2013-12-17 ENCOUNTER — Other Ambulatory Visit: Payer: Self-pay

## 2013-12-17 MED ORDER — INSULIN GLARGINE 100 UNIT/ML ~~LOC~~ SOLN: 12.0000 [IU] | Freq: Every day | SUBCUTANEOUS | Status: DC

## 2014-01-08 ENCOUNTER — Encounter: Payer: Self-pay | Admitting: Internal Medicine

## 2014-01-08 ENCOUNTER — Ambulatory Visit (INDEPENDENT_AMBULATORY_CARE_PROVIDER_SITE_OTHER): Payer: BC Managed Care – PPO | Admitting: Internal Medicine

## 2014-01-08 ENCOUNTER — Other Ambulatory Visit (INDEPENDENT_AMBULATORY_CARE_PROVIDER_SITE_OTHER): Payer: BC Managed Care – PPO

## 2014-01-08 VITALS — BP 131/63 | HR 58 | Temp 98.6°F | Resp 16 | Ht 74.0 in | Wt 185.0 lb

## 2014-01-08 DIAGNOSIS — N410 Acute prostatitis: Secondary | ICD-10-CM

## 2014-01-08 DIAGNOSIS — E039 Hypothyroidism, unspecified: Secondary | ICD-10-CM | POA: Insufficient documentation

## 2014-01-08 DIAGNOSIS — E109 Type 1 diabetes mellitus without complications: Secondary | ICD-10-CM

## 2014-01-08 DIAGNOSIS — Z0000 Encounter for general adult medical examination without abnormal findings: Secondary | ICD-10-CM

## 2014-01-08 DIAGNOSIS — E1139 Type 2 diabetes mellitus with other diabetic ophthalmic complication: Secondary | ICD-10-CM

## 2014-01-08 DIAGNOSIS — Z23 Encounter for immunization: Secondary | ICD-10-CM

## 2014-01-08 DIAGNOSIS — I2510 Atherosclerotic heart disease of native coronary artery without angina pectoris: Secondary | ICD-10-CM

## 2014-01-08 DIAGNOSIS — E7800 Pure hypercholesterolemia, unspecified: Secondary | ICD-10-CM

## 2014-01-08 DIAGNOSIS — I10 Essential (primary) hypertension: Secondary | ICD-10-CM

## 2014-01-08 LAB — COMPREHENSIVE METABOLIC PANEL
ALT: 37 U/L (ref 0–53)
AST: 30 U/L (ref 0–37)
Albumin: 3.8 g/dL (ref 3.5–5.2)
Alkaline Phosphatase: 162 U/L — ABNORMAL HIGH (ref 39–117)
BUN: 13 mg/dL (ref 6–23)
CALCIUM: 9 mg/dL (ref 8.4–10.5)
CHLORIDE: 102 meq/L (ref 96–112)
CO2: 31 mEq/L (ref 19–32)
CREATININE: 1.2 mg/dL (ref 0.4–1.5)
GFR: 66.28 mL/min (ref >60.00)
GLUCOSE: 49 mg/dL — AB (ref 70–99)
Potassium: 4 mEq/L (ref 3.5–5.1)
Sodium: 139 mEq/L (ref 135–145)
Total Bilirubin: 1 mg/dL (ref 0.2–1.2)
Total Protein: 6.7 g/dL (ref 6.0–8.3)

## 2014-01-08 LAB — CBC WITH DIFFERENTIAL/PLATELET
BASOS PCT: 0.2 % (ref 0.0–3.0)
Basophils Absolute: 0 10*3/uL (ref 0.0–0.1)
Eosinophils Absolute: 0.4 10*3/uL (ref 0.0–0.7)
Eosinophils Relative: 3.3 % (ref 0.0–5.0)
HCT: 40.7 % (ref 39.0–52.0)
HEMOGLOBIN: 13.7 g/dL (ref 13.0–17.0)
LYMPHS PCT: 12.2 % (ref 12.0–46.0)
Lymphs Abs: 1.3 10*3/uL (ref 0.7–4.0)
MCHC: 33.7 g/dL (ref 30.0–36.0)
MCV: 93.7 fl (ref 78.0–100.0)
Monocytes Absolute: 0.9 10*3/uL (ref 0.1–1.0)
Monocytes Relative: 7.8 % (ref 3.0–12.0)
NEUTROS ABS: 8.4 10*3/uL — AB (ref 1.4–7.7)
Neutrophils Relative %: 76.5 % (ref 43.0–77.0)
Platelets: 234 10*3/uL (ref 150.0–400.0)
RBC: 4.34 Mil/uL (ref 4.22–5.81)
RDW: 14.2 % (ref 11.5–15.5)
WBC: 11 10*3/uL — ABNORMAL HIGH (ref 4.0–10.5)

## 2014-01-08 LAB — URINALYSIS, ROUTINE W REFLEX MICROSCOPIC
HGB URINE DIPSTICK: NEGATIVE
NITRITE: NEGATIVE
Specific Gravity, Urine: 1.01 (ref 1.000–1.030)
Total Protein, Urine: 30 — AB
UROBILINOGEN UA: 4 — AB (ref 0.0–1.0)
Urine Glucose: NEGATIVE
pH: 6.5 (ref 5.0–8.0)

## 2014-01-08 LAB — LIPID PANEL
CHOL/HDL RATIO: 4
Cholesterol: 120 mg/dL (ref 0–200)
HDL: 31.9 mg/dL — ABNORMAL LOW (ref >39.00)
LDL Cholesterol: 76 mg/dL (ref 0–99)
NonHDL: 88.1
Triglycerides: 61 mg/dL (ref 0.0–149.0)
VLDL: 12.2 mg/dL (ref 0.0–40.0)

## 2014-01-08 LAB — TSH: TSH: 4.54 u[IU]/mL — AB (ref 0.35–4.50)

## 2014-01-08 LAB — PSA: PSA: 1.77 ng/mL (ref 0.10–4.00)

## 2014-01-08 LAB — FECAL OCCULT BLOOD, GUAIAC: Fecal Occult Blood: NEGATIVE

## 2014-01-08 LAB — HEMOGLOBIN A1C: Hgb A1c MFr Bld: 8.7 % — ABNORMAL HIGH (ref 4.6–6.5)

## 2014-01-08 MED ORDER — ATORVASTATIN CALCIUM 40 MG PO TABS: 40.0000 mg | ORAL_TABLET | Freq: Every day | ORAL | Status: DC

## 2014-01-08 MED ORDER — CIPROFLOXACIN HCL 500 MG PO TABS
500.0000 mg | ORAL_TABLET | Freq: Two times a day (BID) | ORAL | Status: DC
Start: 2014-01-08 — End: 2014-01-09

## 2014-01-08 NOTE — Progress Notes (Signed)
Pre visit review using our clinic review tool, if applicable. No additional management support is needed unless otherwise documented below in the visit note. 

## 2014-01-08 NOTE — Assessment & Plan Note (Signed)
He has no s/s but is due for a cardiology f/up

## 2014-01-08 NOTE — Patient Instructions (Signed)
Health Maintenance A healthy lifestyle and preventative care can promote health and wellness.  Maintain regular health, dental, and eye exams.  Eat a healthy diet. Foods like vegetables, fruits, whole grains, low-fat dairy products, and lean protein foods contain the nutrients you need and are low in calories. Decrease your intake of foods high in solid fats, added sugars, and salt. Get information about a proper diet from your health care provider, if necessary.  Regular physical exercise is one of the most important things you can do for your health. Most adults should get at least 150 minutes of moderate-intensity exercise (any activity that increases your heart rate and causes you to sweat) each week. In addition, most adults need muscle-strengthening exercises on 2 or more days a week.   Maintain a healthy weight. The body mass index (BMI) is a screening tool to identify possible weight problems. It provides an estimate of body fat based on height and weight. Your health care provider can find your BMI and can help you achieve or maintain a healthy weight. For males 20 years and older:  A BMI below 18.5 is considered underweight.  A BMI of 18.5 to 24.9 is normal.  A BMI of 25 to 29.9 is considered overweight.  A BMI of 30 and above is considered obese.  Maintain normal blood lipids and cholesterol by exercising and minimizing your intake of saturated fat. Eat a balanced diet with plenty of fruits and vegetables. Blood tests for lipids and cholesterol should begin at age 20 and be repeated every 5 years. If your lipid or cholesterol levels are high, you are over age 50, or you are at high risk for heart disease, you may need your cholesterol levels checked more frequently.Ongoing high lipid and cholesterol levels should be treated with medicines if diet and exercise are not working.  If you smoke, find out from your health care provider how to quit. If you do not use tobacco, do not  start.  Lung cancer screening is recommended for adults aged 55-80 years who are at high risk for developing lung cancer because of a history of smoking. A yearly low-dose CT scan of the lungs is recommended for people who have at least a 30-pack-year history of smoking and are current smokers or have quit within the past 15 years. A pack year of smoking is smoking an average of 1 pack of cigarettes a day for 1 year (for example, a 30-pack-year history of smoking could mean smoking 1 pack a day for 30 years or 2 packs a day for 15 years). Yearly screening should continue until the smoker has stopped smoking for at least 15 years. Yearly screening should be stopped for people who develop a health problem that would prevent them from having lung cancer treatment.  If you choose to drink alcohol, do not have more than 2 drinks per day. One drink is considered to be 12 oz (360 mL) of beer, 5 oz (150 mL) of wine, or 1.5 oz (45 mL) of liquor.  Avoid the use of street drugs. Do not share needles with anyone. Ask for help if you need support or instructions about stopping the use of drugs.  High blood pressure causes heart disease and increases the risk of stroke. Blood pressure should be checked at least every 1-2 years. Ongoing high blood pressure should be treated with medicines if weight loss and exercise are not effective.  If you are 45-79 years old, ask your health care provider if   you should take aspirin to prevent heart disease.  Diabetes screening involves taking a blood sample to check your fasting blood sugar level. This should be done once every 3 years after age 45 if you are at a normal weight and without risk factors for diabetes. Testing should be considered at a younger age or be carried out more frequently if you are overweight and have at least 1 risk factor for diabetes.  Colorectal cancer can be detected and often prevented. Most routine colorectal cancer screening begins at the age of 50  and continues through age 75. However, your health care provider may recommend screening at an earlier age if you have risk factors for colon cancer. On a yearly basis, your health care provider may provide home test kits to check for hidden blood in the stool. A small camera at the end of a tube may be used to directly examine the colon (sigmoidoscopy or colonoscopy) to detect the earliest forms of colorectal cancer. Talk to your health care provider about this at age 50 when routine screening begins. A direct exam of the colon should be repeated every 5-10 years through age 75, unless early forms of precancerous polyps or small growths are found.  People who are at an increased risk for hepatitis B should be screened for this virus. You are considered at high risk for hepatitis B if:  You were born in a country where hepatitis B occurs often. Talk with your health care provider about which countries are considered high risk.  Your parents were born in a high-risk country and you have not received a shot to protect against hepatitis B (hepatitis B vaccine).  You have HIV or AIDS.  You use needles to inject street drugs.  You live with, or have sex with, someone who has hepatitis B.  You are a man who has sex with other men (MSM).  You get hemodialysis treatment.  You take certain medicines for conditions like cancer, organ transplantation, and autoimmune conditions.  Hepatitis C blood testing is recommended for all people born from 1945 through 1965 and any individual with known risk factors for hepatitis C.  Healthy men should no longer receive prostate-specific antigen (PSA) blood tests as part of routine cancer screening. Talk to your health care provider about prostate cancer screening.  Testicular cancer screening is not recommended for adolescents or adult males who have no symptoms. Screening includes self-exam, a health care provider exam, and other screening tests. Consult with your  health care provider about any symptoms you have or any concerns you have about testicular cancer.  Practice safe sex. Use condoms and avoid high-risk sexual practices to reduce the spread of sexually transmitted infections (STIs).  You should be screened for STIs, including gonorrhea and chlamydia if:  You are sexually active and are younger than 24 years.  You are older than 24 years, and your health care provider tells you that you are at risk for this type of infection.  Your sexual activity has changed since you were last screened, and you are at an increased risk for chlamydia or gonorrhea. Ask your health care provider if you are at risk.  If you are at risk of being infected with HIV, it is recommended that you take a prescription medicine daily to prevent HIV infection. This is called pre-exposure prophylaxis (PrEP). You are considered at risk if:  You are a man who has sex with other men (MSM).  You are a heterosexual man who   is sexually active with multiple partners.  You take drugs by injection.  You are sexually active with a partner who has HIV.  Talk with your health care provider about whether you are at high risk of being infected with HIV. If you choose to begin PrEP, you should first be tested for HIV. You should then be tested every 3 months for as long as you are taking PrEP.  Use sunscreen. Apply sunscreen liberally and repeatedly throughout the day. You should seek shade when your shadow is shorter than you. Protect yourself by wearing long sleeves, pants, a wide-brimmed hat, and sunglasses year round whenever you are outdoors.  Tell your health care provider of new moles or changes in moles, especially if there is a change in shape or color. Also, tell your health care provider if a mole is larger than the size of a pencil eraser.  A one-time screening for abdominal aortic aneurysm (AAA) and surgical repair of large AAAs by ultrasound is recommended for men aged  31-75 years who are current or former smokers.  Stay current with your vaccines (immunizations). Document Released: 11/19/2007 Document Revised: 05/28/2013 Document Reviewed: 10/18/2010 [facility] Patient Information 2015 [place], [place]. This information is not intended to replace advice given to you by your health care provider. Make sure you discuss any questions you have with your health care provider. Type 1 Diabetes Mellitus Type 1 diabetes mellitus, often simply referred to as diabetes, is a long-term (chronic) disease. It occurs when the islet cells in the pancreas that make insulin (a hormone) are destroyed and can no longer make insulin. Insulin is needed to move sugars from food into the tissue cells. The tissue cells use the sugars for energy. In people with type 1 diabetes, the sugars build up in the blood instead of going into the tissue cells. As a result, high blood sugar (hyperglycemia) develops. Without insulin, the body breaks down fat cells for the needed energy. This breakdown of fat cells produces acid chemicals (ketones), which increases the acid levels in the body. The effect of either high ketone or high sugar (glucose) levels can be life-threatening.  Type 1 diabetes was also previously called juvenile diabetes. It most often occurs before the age of 34, but it can occur at any age. RISK FACTORS A person is predisposed to developing type 1 diabetes if someone in his or her family has the disease and is exposed to certain additional environmental triggers.  SYMPTOMS  Symptoms of type 1 diabetes may develop gradually over days to weeks or suddenly. The symptoms occur due to hyperglycemia. The symptoms can include:   Increased thirst (polydipsia).  Increased urination (polyuria).  Increased urination during the night (nocturia).  Weight loss. This weight loss may be rapid.  Frequent, recurring infections.  Tiredness (fatigue).  Weakness.  Vision changes, such as  blurred vision.  Fruity smell to your breath.  Abdominal pain.  Nausea or vomiting. DIAGNOSIS  Type 1 diabetes is diagnosed when symptoms of diabetes are present and when blood glucose levels are increased. Your blood glucose level may be checked by one or more of the following blood tests:  A fasting blood glucose test. You will not be allowed to eat for at least 8 hours before a blood sample is taken.  A random blood glucose test. Your blood glucose is checked at any time of the day regardless of when you ate.  A hemoglobin A1c blood glucose test. A hemoglobin A1c test provides information about blood  glucose control over the previous 3 months. TREATMENT  Although type 1 diabetes cannot be prevented, it can be managed with insulin, diet, and exercise.  You will need to take insulin daily to keep blood glucose in the desired range.  You will need to match insulin dosing with exercise and healthy food choices. The treatment goal is to maintain the before-meal blood sugar (preprandial glucose) level at 70-130 mg/dL.  HOME CARE INSTRUCTIONS   Have your hemoglobin A1c level checked twice a year.  Perform daily blood glucose monitoring as directed by your health care provider.  Monitor urine ketones when you are ill and as directed by your health care provider.  Take your insulin as directed by your health care provider to maintain your blood glucose level in the desired range.  Never run out of insulin. It is needed every day.  Adjust insulin based on your intake of carbohydrates. Carbohydrates can raise blood glucose levels but need to be included in your diet. Carbohydrates provide vitamins, minerals, and fiber, which are an essential part of a healthy diet. Carbohydrates are found in fruits, vegetables, whole grains, dairy products, legumes, and foods containing added sugars.  Eat healthy foods. Alternate 3 meals with 3 snacks.  Maintain a healthy weight.  Carry a medical  alert card or wear your medical alert jewelry.  Carry a 15-gram carbohydrate snack with you at all times to treat low blood glucose (hypoglycemia). Some examples of 15-gram carbohydrate snacks include:  Glucose tablets, 3 or 4.  Glucose gel, 15-gram tube.  Raisins, 2 tablespoons (24 grams).  Jelly beans, 6.  Animal crackers, 8.  Fruit juice, regular soda, or low-fat milk, 4 ounces (120 mL).  Gummy treats, 9.  Recognize hypoglycemia. Hypoglycemia occurs with blood glucose levels of 70 mg/dL and below. The risk for hypoglycemia increases when fasting or skipping meals, during or after intense exercise, and during sleep. Hypoglycemia symptoms can include:  Tremors or shakes.  Decreased ability to concentrate.  Sweating.  Increased heart rate.  Headache.  Dry mouth.  Hunger.  Irritability.  Anxiety.  Restless sleep.  Altered speech or coordination.  Confusion.  Treat hypoglycemia promptly. If you are alert and able to safely swallow, follow the 15:15 rule:  Take 15-20 grams of rapid-acting glucose or carbohydrate. Rapid-acting options include glucose gel, glucose tablets, or 4 ounces (120 mL) of fruit juice, regular soda, or low-fat milk.  Check your blood glucose level 15 minutes after taking the glucose.  Take 15-20 grams more of glucose if the repeat blood glucose level is still 70 mg/dL or below.  Eat a meal or snack within 1 hour once blood glucose levels return to normal.  Be alert to polyuria and polydipsia, which are early signs of hyperglycemia. An early awareness of hyperglycemia allows for prompt treatment. Treat hyperglycemia as directed by your health care provider.  Exercise regularly as directed by your health care provider. This includes:  Performing resistance training twice a week such as push-ups, sit-ups, lifting weights, or using resistance bands.  Performing 150 minutes of cardio exercises each week such as walking, running, or playing  sports.  Staying active and spending no more than 90 minutes at one time being inactive.  Adjust your insulin dosing and food intake as needed if you start a new exercise or sport.  Follow your sick-day plan at any time you are unable to eat or drink as usual.   Do not use any tobacco products including cigarettes, chewing tobacco, or electronic  cigarettes. If you need help quitting, ask your health care provider.  Limit alcohol intake to no more than 1 drink per day for nonpregnant women and 2 drinks per day for men. You should drink alcohol only when you are also eating food. Talk with your health care provider about whether alcohol is safe for you. Tell your health care provider if you drink alcohol several times a week.  Keep all follow-up visits as directed by your health care provider.  Schedule an eye exam within 5 years of diagnosis and then annually.  Perform daily skin and foot care. Examine your skin and feet daily for cuts, bruises, redness, nail problems, bleeding, blisters, or sores. A foot exam by a health care provider should be done annually.  Brush your teeth and gums at least twice a day and floss at least once a day. Follow up with your dentist regularly.  Share your diabetes management plan with your workplace or school.  Stay up-to-date with immunizations. It is recommended that people with diabetes who are over 64 years old get the pneumonia vaccine. In some cases, two separate shots may be given. Ask your health care provider if your pneumonia vaccination is up-to-date.  Learn to manage stress.  Obtain ongoing diabetes education and support as needed.  Participate in or seek rehabilitation as needed to maintain or improve independence and quality of life. Request a physical or occupational therapy referral if you are having foot or hand numbness, or difficulties with grooming, dressing, eating, or physical activity. SEEK MEDICAL CARE IF:   You are unable to eat  food or drink fluids for more than 6 hours.  You have nausea and vomiting for more than 6 hours.  Your blood glucose level is over 240 mg/dL.  There is a change in mental status.  You develop an additional serious illness.  You have diarrhea for more than 6 hours.  You have been sick or have had a fever for a couple of days and are not getting better.  You have pain during any physical activity. SEEK IMMEDIATE MEDICAL CARE IF:  You have difficulty breathing.  You have moderate to large ketone levels. MAKE SURE YOU:  Understand these instructions.  Will watch your condition.  Will get help right away if you are not doing well or get worse. Document Released: 05/20/2000 Document Revised: 10/07/2013 Document Reviewed: 12/20/2011 [facility] Patient Information 2015 [place], [place]. This information is not intended to replace advice given to you by your health care provider. Make sure you discuss any questions you have with your health care provider.

## 2014-01-08 NOTE — Assessment & Plan Note (Signed)
I will recheck his TSH and will adjust his dose if needed 

## 2014-01-08 NOTE — Assessment & Plan Note (Signed)
I will recheck his CBC today 

## 2014-01-08 NOTE — Assessment & Plan Note (Addendum)
I think the ccb has caused edema so I have asked him to stop taking He will cont the other meds Today I will check his labs to look for secondary causes and end organ damage

## 2014-01-08 NOTE — Assessment & Plan Note (Signed)
His blood sugars are not well controlled I have asked him to see ENDO for further evaluation

## 2014-01-08 NOTE — Addendum Note (Signed)
Addended by: Janith Lima on: 01/08/2014 05:12 PM   Modules accepted: Orders

## 2014-01-08 NOTE — Progress Notes (Signed)
Subjective:    Patient ID: Logan [name], male    DOB: 12-05-1950, 63 y.o.   MRN: [id number]  Hypertension This is a chronic problem. The current episode started more than 1 year ago. The problem is unchanged. The problem is controlled. Associated symptoms include peripheral edema. Pertinent negatives include no anxiety, blurred vision, chest pain, headaches, malaise/fatigue, neck pain, orthopnea, palpitations, PND, shortness of breath or sweats. There are no associated agents to hypertension. Past treatments include central alpha agonists, calcium channel blockers, angiotensin blockers and beta blockers. The current treatment provides moderate improvement. Compliance problems include exercise and diet.  Hypertensive end-organ damage includes CAD/MI.      Review of Systems  Constitutional: Negative.  Negative for fever, chills, malaise/fatigue, diaphoresis, appetite change and fatigue.  HENT: Negative.   Eyes: Negative.  Negative for blurred vision.  Respiratory: Negative for apnea, cough, choking, chest tightness, shortness of breath, wheezing and stridor.   Cardiovascular: Positive for leg swelling (redness and swelling over both ankles). Negative for chest pain, palpitations, orthopnea and PND.  Gastrointestinal: Negative.  Negative for nausea, vomiting, abdominal pain, diarrhea and blood in stool.  Endocrine: Positive for polydipsia, polyphagia and polyuria.  Genitourinary: Negative.  Negative for dysuria, urgency, frequency, hematuria, flank pain, decreased urine volume, penile swelling, enuresis, difficulty urinating, genital sores and penile pain.  Musculoskeletal: Negative.  Negative for arthralgias, back pain and neck pain.  Skin: Negative.  Negative for rash.  Allergic/Immunologic: Negative.   Neurological: Negative.  Negative for headaches.  Hematological: Negative.  Negative for adenopathy. Does not bruise/bleed easily.  Psychiatric/Behavioral: Negative.        Objective:     Physical Exam  Vitals reviewed. Constitutional: He is oriented to person, place, and time. He appears well-developed and well-nourished. No distress.  HENT:  Head: Atraumatic.  Mouth/Throat: Oropharynx is clear and moist. No oropharyngeal exudate.  Eyes: Conjunctivae are normal. Right eye exhibits no discharge. Left eye exhibits no discharge. No scleral icterus.  Neck: Normal range of motion. Neck supple. No JVD present. No tracheal deviation present. No thyromegaly present.  Cardiovascular: Normal rate, regular rhythm, normal heart sounds and intact distal pulses.  Exam reveals no gallop and no friction rub.   No murmur heard. Pulmonary/Chest: Effort normal and breath sounds normal. No stridor. No respiratory distress. He has no wheezes. He has no rales. He exhibits no tenderness.  Abdominal: Soft. Bowel sounds are normal. He exhibits no distension and no mass. There is no tenderness. There is no rebound and no guarding. Hernia confirmed negative in the right inguinal area and confirmed negative in the left inguinal area.  Genitourinary: Rectum normal, prostate normal, testes normal and penis normal. Rectal exam shows no external hemorrhoid, no internal hemorrhoid, no fissure, no mass, no tenderness and anal tone normal. Guaiac negative stool. Prostate is not enlarged and not tender. Right testis shows no mass, no swelling and no tenderness. Right testis is descended. Left testis shows no mass, no swelling and no tenderness. Left testis is descended. Uncircumcised. No phimosis, paraphimosis, hypospadias, penile erythema or penile tenderness. No discharge found.  Musculoskeletal: Normal range of motion. He exhibits edema (1+ pitting edema with erythema over BLE's). He exhibits no tenderness.  Lymphadenopathy:    He has no cervical adenopathy.       Right: No inguinal adenopathy present.       Left: No inguinal adenopathy present.  Neurological: He is oriented to person, place, and time.  Skin:  Skin is warm and dry.  No rash noted. He is not diaphoretic. No erythema. No pallor.  Psychiatric: He has a normal mood and affect. His behavior is normal. Judgment and thought content normal.     Lab Results  Component Value Date   WBC 9.4 03/11/2013   HGB 15.3 08/14/2013   HCT 44.8 08/14/2013   PLT 276 03/11/2013   GLUCOSE 127* 08/14/2013   CHOL 151 08/14/2013   TRIG 78.0 08/14/2013   HDL 44.80 08/14/2013   LDLCALC 91 08/14/2013   ALT 28 08/14/2013   AST 24 08/14/2013   NA 139 08/14/2013   K 3.5 08/14/2013   CL 101 08/14/2013   CREATININE 1.1 08/14/2013   BUN 13 08/14/2013   CO2 31 08/14/2013   PSA 0.78 07/31/2006   HGBA1C 10.0* 08/14/2013       Assessment & Plan:

## 2014-01-08 NOTE — Assessment & Plan Note (Signed)
Exam done Vaccines were updated He was referred for a colonoscopy Labs ordered Pt ed material was given 

## 2014-01-08 NOTE — Assessment & Plan Note (Signed)
I have asked him to restart a statin Will check his FLP today

## 2014-01-09 ENCOUNTER — Encounter: Payer: Self-pay | Admitting: Gastroenterology

## 2014-01-09 MED ORDER — CIPROFLOXACIN HCL 500 MG PO TABS: 500.0000 mg | ORAL_TABLET | Freq: Two times a day (BID) | ORAL | Status: DC

## 2014-01-09 NOTE — Addendum Note (Signed)
Addended by: Janith Lima on: 01/09/2014 11:45 AM   Modules accepted: Orders

## 2014-01-20 ENCOUNTER — Encounter: Payer: Self-pay | Admitting: Internal Medicine

## 2014-01-22 ENCOUNTER — Ambulatory Visit (INDEPENDENT_AMBULATORY_CARE_PROVIDER_SITE_OTHER): Payer: BC Managed Care – PPO | Admitting: Endocrinology

## 2014-01-22 ENCOUNTER — Encounter: Payer: Self-pay | Admitting: Endocrinology

## 2014-01-22 VITALS — BP 129/53 | HR 47 | Temp 98.0°F | Resp 16 | Ht 74.0 in | Wt 184.8 lb

## 2014-01-22 DIAGNOSIS — E1142 Type 2 diabetes mellitus with diabetic polyneuropathy: Secondary | ICD-10-CM

## 2014-01-22 DIAGNOSIS — E039 Hypothyroidism, unspecified: Secondary | ICD-10-CM

## 2014-01-22 DIAGNOSIS — I10 Essential (primary) hypertension: Secondary | ICD-10-CM

## 2014-01-22 DIAGNOSIS — E1149 Type 2 diabetes mellitus with other diabetic neurological complication: Secondary | ICD-10-CM

## 2014-01-22 NOTE — Patient Instructions (Signed)
Please check blood sugars before each meal and at least once a day about 2 hours after any meal and 3-4 times a week on waking up.  Please bring blood sugar monitor to each visit  Take suppertime dose 15-30 minutes before eating Make sure to have some protein at every meal including breakfast Have a carbohydrate snack or drink some juice before planning to being very active

## 2014-01-22 NOTE — Progress Notes (Signed)
Patient ID: Logan [name], male   DOB: [birth year], 63 y.o.   MRN: [id number]           Reason for Appointment: Consultation for Diabetes  Referring physician: Scarlette Calico  History of Present Illness:          Diagnosis: Type 2 diabetes mellitus, date of diagnosis: 1984        Past history: He was told at age 44 that his blood sugar was somewhat high but no specific treatment was done In 1984 his blood sugars were reportedly high, possibly he had a GTT to confirm this For some reason his physician or on insulin at that time without trying any other medications He thinks he has been on 70/30 insulin since then His blood sugar has been overall inconsistent and usually poorly controlled with A1c at least 8% since 2011 About 15-17 years ago he was also given Lantus in addition to his 70/30 insulin  Recent history:  His A1c has been overall higher in the last year and he is not referred him for further management He thinks his blood sugars are often poorly controlled with this eating large portions and not watching his diet He generally takes his morning insulin before breakfast but his evening insulin is taken somewhat randomly at the time of his evening meal, sometimes after eating He does not adjust his Lantus insulin and has been on the same dose for quite some time He checks his blood sugar somewhat sporadically He thinks his blood sugars are usually fairly good in the morning but relatively higher in the afternoon and after supper; he claims his blood sugars are no higher than 160 does not keep a record, did not bring his monitor today  Oral hypoglycemic drugs the patient is taking are:   none     INSULIN regimen is described as: Novolin 70/30: 17 ac breakfast--17 ac or pc supper. Lantus 12 units at bedtime Compliance with the medical regimen: Inconsistent Hypoglycemia: This occurs periodically and often when he is more active. He will usually not have any symptoms of low sugars. If  his blood sugar is low he will treat it with a lot of food  Glucose monitoring:  variable intervals         Glucometer: Freestyle which is 92-21 years old     Blood Glucose readings by time of day and averages from meter download:  PREMEAL Breakfast Lunch Dinner Bedtime  Overall   Glucose range: 90-110 ?   150   160    Median:        Glycemic control:   Lab Results  Component Value Date   HGBA1C 8.7* 01/08/2014   HGBA1C 10.0* 08/14/2013   HGBA1C 8.9* 03/11/2013   Lab Results  Component Value Date   LDLCALC 76 01/08/2014   CREATININE 1.2 01/08/2014    Retinal exam: Most recent: 8/14. He has had retinopathy in the past treated with laser.   Self-care: The diet that the patient has been following is: None. He sometimes will eat 5-6 times a day. Portion sizes quite variable but usually eating date fish and chicken.      Meals: 3 meals per day. Breakfast is frequently large portions of Fruits, sometimes eggs. Sometimes will eat more biscuits. Usually avoiding drinks with sugar           Exercise:  at times with activity like lawn mowing or, hunting        Dietician visit: years ago  Weight history: Wt Readings from Last 3 Encounters:  01/22/14 184 lb 12.8 oz (83.825 kg)  01/08/14 185 lb (83.915 kg)  08/14/13 184 lb 6.4 oz (83.643 kg)       Medication List       This list is accurate as of: 01/22/14 11:45 AM.  Always use your most recent med list.               amLODipine 10 MG tablet  Commonly known as:  NORVASC     aspirin 81 MG tablet  Take 81 mg by mouth 2 (two) times daily.     ciprofloxacin 500 MG tablet  Commonly known as:  CIPRO  Take 1 tablet (500 mg total) by mouth 2 (two) times daily.     cloNIDine 0.1 MG tablet  Commonly known as:  CATAPRES  Take 1 tablet (0.1 mg total) by mouth daily as needed. If BP is over 180     insulin glargine 100 UNIT/ML injection  Commonly known as:  LANTUS  Inject 0.12 mLs (12 Units total) into the skin at bedtime.       insulin NPH-regular Human (70-30) 100 UNIT/ML injection  Commonly known as:  NOVOLIN 70/30  Inject 15-17 Units into the skin 2 (two) times daily with a meal. Total of 32 units per day     levothyroxine 75 MCG tablet  Commonly known as:  SYNTHROID, LEVOTHROID  TAKE ONE TABLET BY MOUTH ONCE DAILY     losartan 100 MG tablet  Commonly known as:  COZAAR  TAKE ONE TABLET BY MOUTH ONCE DAILY     metoprolol 50 MG tablet  Commonly known as:  LOPRESSOR  TAKE ONE & ONE-HALF TABLETS BY MOUTH ONCE DAILY     simvastatin 20 MG tablet  Commonly known as:  ZOCOR        Allergies:  Allergies  Allergen Reactions  . Amlodipine     edema    Past Medical History  Diagnosis Date  . Hypertension   . Diabetes mellitus   . Hearing loss   . CAD (coronary artery disease)   . Hemochromatosis   . Hyperlipidemia   . Diabetic retinopathy     Past Surgical History  Procedure Laterality Date  . Inner ear surgery      right  . 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 Asthma Mother   . Heart failure Mother   . Emphysema Mother   . Alcohol abuse Father   . Cancer Father     prostate  . Stroke Father   . Heart disease Neg Hx   . Colon cancer Neg Hx     Social History:  reports that he has never smoked. He has never used smokeless tobacco. He reports that he does not drink alcohol or use illicit drugs.    Review of Systems       Lipids: Low HDL,LDL at target with simvastatin       Lab Results  Component Value Date   CHOL 120 01/08/2014   HDL 31.90* 01/08/2014   LDLCALC 76 01/08/2014   TRIG 61.0 01/08/2014   CHOLHDL 4 01/08/2014                  Skin: No rash or infections  Thyroid:  No  unusual fatigue. Has been hypothyroid for several years and is Regular on his prescription, no  recent changes made  Lab Results  Component Value Date   TSH 4.54* 01/08/2014       The blood pressure has been treated with amlodipine, high doses of metoprolol and Cozaar      No swelling of feet.     No shortness of breath or chest tightness  on exertion.     Bowel habits: Normal.     ? Hemochromatosis: Unable to find any documentation of high ferritin level, previous levels have been normal to low       No frequency of urination or nocturia       No joint  pains.          No history of Numbness, tingling or burning in feet    Toe amputation in 10/14 because of diabetic ulcer. He is seeing is his orthopedic surgeon  periodically, recently had blister on left big toe  LABS:  No visits with results within 1 Week(s) from this visit. Latest known visit with results is:  Office Visit on 01/08/2014  Component Date Value Ref Range Status  . HM Diabetic Eye Exam 01/10/2013 Retinopathy* No Retinopathy Final  . Fecal Occult Blood 01/08/2014 Negative   Final    Physical Examination:  BP 129/53  Pulse 47  Temp(Src) 98 F (36.7 C)  Resp 16  Ht 6\' 2"  (1.88 m)  Wt 184 lb 12.8 oz (83.825 kg)  BMI 23.72 kg/m2  SpO2 97%  GENERAL:         Patient is averagely built, well nourished  HEENT:         Eye exam shows normal external appearance. Fundus exam shows no obvious retinopathy.  Oral exam shows normal mucosa .  NECK:         General:  Neck exam shows no lymphadenopathy. Carotids are normal to palpation and no bruit heard.  Thyroid is not enlarged and no nodules felt.   LUNGS:         Chest is symmetrical. Lungs are clear to auscultation.Marland Kitchen   HEART:         Heart sounds:  S1 and S2 are normal. No murmurs or clicks heard., no S3 or S4.   ABDOMEN:   There is no distention present. Liver and spleen are not palpable. No other mass or tenderness present.  EXTREMITIES:     There is no edema. No skin lesions present.Marland Kitchen  NEUROLOGICAL:   Vibration sense is  moderately reduced in toes.  Ankle jerks are absent bilaterally.          Diabetic foot exam:  Amputation of the right great toe present. Has prominent bony area on the lateral part of each foot in the midfoot area Callous present on the bottom of the right foot and heel toe ulcer on the left bottom of the great toe present Monofilament sensation markedly decreased throughout MUSCULOSKELETAL:       There is no enlargement or deformity of the joints. Spine is normal to inspection.Marland Kitchen   SKIN:       No rash      ASSESSMENT:  Diabetes type 2, not obese, uncontrolled   He has been on an unphysiological regimen of 70/30 insulin twice a day which is not controlling his glucose especially with postprandial hyperglycemia He is also very inconsistent in his diet and carbohydrate intake Blood sugars are also more labile because of sporadic  physical activity which can cause significant hypoglycemia Also appears to have relatively poor recognition of hypoglycemia which may be partly because of his toes beta blockers for hypertension  He is not checking his blood sugar somewhat erratically and not clear if he is documenting the high blood sugars after meals to explain  his A1c of 8.9%. Will need to also verify the accuracy of his meter  Discussed with the patient that the ideal way of controlling his diabetes would be a basal bolus insulin regimen with Lantus and mealtime rapid acting insulin but he is very reluctant to take 4 injections a day However discussed the option of using the V.-go pump and showed him the video of how this works He is quite interested in this and will look further into it. 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 He will take 6-8 units of bolus  covering his meals and 2-4 units for large snacks  Discussed checking blood sugar 3-4 times a day including some after meals and bring his monitor for download on the next visit He will prevent hyperglycemia when he is planning to have physical activity with drinking juice or having her carbohydrate intake before the activity  He needs more education regarding meal planning. Discussed having protein consistently at breakfast and balanced meals  Also would recommend reducing his metoprolol dose since he has reduced recognition of hypoglycemia, may be able to use other drugs instead.  Meanwhile he will try to take his evening insulin 15-30 minutes before eating consistently  Counseling time over 50% of today's 60 minute visit  Miyonna Ormiston 01/22/2014, 11:45 AM   Note: This office note was prepared with Estate agent. Any transcriptional errors that result from this process are unintentional.

## 2014-02-03 ENCOUNTER — Encounter: Payer: BC Managed Care – PPO | Admitting: Nutrition

## 2014-02-03 ENCOUNTER — Encounter: Payer: BC Managed Care – PPO | Attending: Endocrinology | Admitting: Nutrition

## 2014-02-03 DIAGNOSIS — E109 Type 1 diabetes mellitus without complications: Secondary | ICD-10-CM

## 2014-02-03 DIAGNOSIS — Z713 Dietary counseling and surveillance: Secondary | ICD-10-CM | POA: Diagnosis present

## 2014-02-03 DIAGNOSIS — Z794 Long term (current) use of insulin: Secondary | ICD-10-CM | POA: Diagnosis not present

## 2014-02-03 NOTE — Progress Notes (Signed)
This patient was instructed on how to fill, attach and use the V-Go.  He had taken his Lantus last night and his 70/30 insulin this AM, so we were not able to attach and start the V-go.  h brought in blood sugars for the last 7 days.  He has been watching what he is eating, and has had low blood sugars at least once a day.  FBSs-45, 51, 71, 56, 61, and 134.  Other blood sugars are variable, with some lows acS due to activity, and at HS due to not snacking.    He was instructed on how to fill and apple the V-Go.  He was given a Chartered certified accountant and V-Go 20 starter kit.  He will call the company today to research insurance coverage.  His filled a demo with saline, and attached it without difficulty.  He was told to follow the directions given to him in the starter kit, and to take 3-4 button presses before each meal and 1-2 button presses before HS snack if having one.  Written instructions were given for this.  He reported good understanding of this and had no questions.   We reviewed what carbohydrates were in what he is eating and the fact that if he eats more than normal, he will need to take the larger dose.  He is aware that each button press is 2 units of insulin.  Written instructions were given to stop his Lantus insulin tonight, and to take no other insulin.  He reported good understanding of this.  He was also told that he will be active after the meals, then he will reduce his premeal insulin by 1-2 button presses.  He reported good understanding of this, and written instructions were given for this as well.  I will call him in the morning to see how he did with filling a applying the V-Go.

## 2014-02-03 NOTE — Patient Instructions (Addendum)
Call [employer] customer care to see how much you will have to pay each month for the V-Gos.  Take no Lantus tonight.  Take no 70/30 insulin tomorrow.   Take 3-4  button presses before each meal, and 1-2 button presses before large snacks. Test blood sugars before meals and at bedtime. Call blood sugar readings to office on Friday, and let us know if you will want this.   Call if blood sugars drop low.

## 2014-02-04 ENCOUNTER — Telehealth: Payer: Self-pay | Admitting: Endocrinology

## 2014-02-04 NOTE — Telephone Encounter (Signed)
Patient reported that he filled, applied and used the V-go 20 this morning without any problems.  He reported taking no other insulin this AM, or at bedtime last night. His FBS today was 125.  He said that he called [employer] customer care and they will call him back about what his cost will be tomorrow.  He had no questions at this time. He was reminded to call the office, if blood sugars stay high, or drop low, and to call us with blood sugar readings of Friday.   He is not sure he will like this device, and will let us know that on Friday as well.

## 2014-02-11 NOTE — Telephone Encounter (Signed)
Left message at the patient's home. His blood sugars from last week on the 20 units V-go pump after Wednesday afternoon ranged from 51-187 and blood sugars were excessively low to be on the pump. Advised him to come into the office to discuss treatment options since pump will cause hypoglycemia with the 20 unit basal

## 2014-02-14 ENCOUNTER — Ambulatory Visit: Payer: BC Managed Care – PPO | Admitting: Internal Medicine

## 2014-02-19 ENCOUNTER — Encounter: Payer: Self-pay | Admitting: Endocrinology

## 2014-02-26 ENCOUNTER — Telehealth: Payer: Self-pay | Admitting: Endocrinology

## 2014-02-26 NOTE — Telephone Encounter (Signed)
Dismissal Letter sent by Certified Mail [phone]/[id number]/0539  Received the Return Receipt showing someone picked up Dismissal 03/03/14

## 2014-02-28 ENCOUNTER — Encounter: Payer: Self-pay | Admitting: Internal Medicine

## 2014-02-28 ENCOUNTER — Ambulatory Visit (INDEPENDENT_AMBULATORY_CARE_PROVIDER_SITE_OTHER): Payer: BC Managed Care – PPO | Admitting: Internal Medicine

## 2014-02-28 VITALS — BP 165/80 | HR 58 | Ht 74.0 in | Wt 185.0 lb

## 2014-02-28 DIAGNOSIS — I2510 Atherosclerotic heart disease of native coronary artery without angina pectoris: Secondary | ICD-10-CM

## 2014-02-28 NOTE — Progress Notes (Signed)
HPI History of Present Illness:  The patient is 63 years old and return for followup management of CAD. 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 He had nonobstructive disease at catheterization in 2004. He had a negative Myoview scan in 2009 with an ejection fraction of 49%.  he recently was seen by Dr. Isidore Moos with syncopal episodes. 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 His diltiazem was discontinued after the monitor and he has had no recurrent symptoms.  He's had no recent chest pain.  patinet last seen in 2011.   Now follwed by Dr Ronnald Ramp  And Dr Dwyane Dee  BP in other clinics 120-130  No CP   Breathing is OK Doesn't exercise regularly    Allergies  Allergen Reactions  . Amlodipine     edema    Current Outpatient Prescriptions  Medication Sig Dispense Refill  . amLODipine (NORVASC) 10 MG tablet Take 10 mg by mouth daily.       Marland Kitchen aspirin 81 MG tablet Take 81 mg by mouth 2 (two) times daily.       . cloNIDine (CATAPRES) 0.1 MG tablet Take 1 tablet (0.1 mg total) by mouth daily as needed. If BP is over 180  60 tablet  11  . insulin glargine (LANTUS) 100 UNIT/ML injection Inject 0.12 mLs (12 Units total) into the skin at bedtime.  10 mL  5  . insulin NPH-regular (NOVOLIN 70/30) (70-30) 100 UNIT/ML injection Inject 15-17 Units into the skin 2 (two) times daily with a meal. Total of 32 units per day      . levothyroxine (SYNTHROID, LEVOTHROID) 75 MCG tablet TAKE ONE TABLET BY MOUTH ONCE DAILY  90 tablet  3  . losartan (COZAAR) 100 MG tablet TAKE ONE TABLET BY MOUTH ONCE DAILY  90 tablet  3  . metoprolol (LOPRESSOR) 50 MG tablet TAKE ONE & ONE-HALF TABLETS BY MOUTH ONCE DAILY  135 tablet  3  . simvastatin (ZOCOR) 20 MG tablet Take 20 mg by mouth daily.        No current facility-administered medications for this visit.    Past Medical History  Diagnosis Date  . Hypertension   . Diabetes mellitus   . Hearing  loss   . CAD (coronary artery disease)   . Hemochromatosis   . Hyperlipidemia   . Diabetic retinopathy     Past Surgical History  Procedure Laterality Date  . Inner ear surgery      right  . 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 Asthma Mother   . Heart failure Mother   . Emphysema Mother   . Alcohol abuse Father   . Cancer Father     prostate  . Stroke Father   . Heart disease Neg Hx   . Colon cancer Neg Hx     History   Social History  . Marital Status: Widowed    Spouse Name: N/A    Number of Children: 1  . Years of Education: 12   Occupational History  . retired    Social History Main Topics  . Smoking status: Never Smoker   . Smokeless tobacco: Never Used  . Alcohol Use: No  . Drug Use: No  .  Sexual Activity: Yes    Partners: Female   Other Topics Concern  . Not on file   Social History Narrative   [employer]. Married - '77 - '04. 1 son '80. Work - semi-retired. Has a girlfriend.    Review of Systems:  All systems reviewed.  They are negative to the above problem except as previously stated.  Vital Signs: BP 165/80  Pulse 58  Ht 6\' 2"  (1.88 m)  Wt 185 lb (83.915 kg)  BMI 23.74 kg/m2  Physical Exam  HEENT:  Normocephalic, atraumatic. EOMI, PERRLA.  Neck: JVP is normal.  No bruits.  Lungs: clear to auscultation. No rales no wheezes.  Heart: Regular rate and rhythm. Normal S1, S2. No S3.   Gr I/Vi systolic murmur RUSB  . PMI not displaced.  Abdomen:  Supple, nontender. Normal bowel sounds. No masses. No hepatomegaly.  Extremities:   Good distal pulses throughout. No lower extremity edema.  Musculoskeletal :moving all extremities.  Neuro:   alert and oriented x3.  CN II-XII grossly intact.  EKG  SB  IWMI   Assessment and Plan:  1.  CAD  No symptoms  of angina.   2.  HTN  BP is a little high  Not in past two clinic visits  Follow    3.  HL  Good control    F?U 1 year

## 2014-02-28 NOTE — Patient Instructions (Signed)
Your physician wants you to follow-up in: 1 year with Dr. Ross. You will receive a reminder letter in the mail two months in advance. If you don't receive a letter, please call our office to schedule the follow-up appointment.  Your physician recommends that you continue on your current medications as directed. Please refer to the Current Medication list given to you today.  

## 2014-03-06 ENCOUNTER — Ambulatory Visit (AMBULATORY_SURGERY_CENTER): Payer: Self-pay | Admitting: *Deleted

## 2014-03-06 VITALS — Ht 74.0 in | Wt 185.8 lb

## 2014-03-06 DIAGNOSIS — Z1211 Encounter for screening for malignant neoplasm of colon: Secondary | ICD-10-CM

## 2014-03-06 MED ORDER — MOVIPREP 100 G PO SOLR: 1.0000 | Freq: Once | ORAL | Status: DC

## 2014-03-06 NOTE — Progress Notes (Signed)
No home 02 use. ewm No egg or soy allergy. ewm No diet pills. ewm No problems with past sedation. ewm Pt declined emmi video. ewm Doing this patient's pre visit and going over instructions. Got to  the part with care partner, informed pt of policy with care partner being here with pt the entire 2-3 hours. Pt states this is a big problem because i have no one to come with me. Informed pt that policy is care partner has to be here 2-3 hours, he said "i have no one so you might as well cancel this", he stands up, i said let me explain, he states "no no this is an issue. i have no one so you cancel this". Tried to explain about sedation, inability to drive etc. He proceeds to walk out of [address], says "cancel this" and closes the door.  Procedure cancelled.  Lenard Galloway RN

## 2014-03-20 ENCOUNTER — Encounter: Payer: BC Managed Care – PPO | Admitting: Gastroenterology

## 2014-04-16 DIAGNOSIS — H2513 Age-related nuclear cataract, bilateral: Secondary | ICD-10-CM | POA: Insufficient documentation

## 2014-04-16 DIAGNOSIS — Z9889 Other specified postprocedural states: Secondary | ICD-10-CM | POA: Insufficient documentation

## 2014-04-16 DIAGNOSIS — E109 Type 1 diabetes mellitus without complications: Secondary | ICD-10-CM | POA: Insufficient documentation

## 2014-05-15 ENCOUNTER — Ambulatory Visit (INDEPENDENT_AMBULATORY_CARE_PROVIDER_SITE_OTHER): Payer: BC Managed Care – PPO | Admitting: Family

## 2014-05-15 ENCOUNTER — Encounter: Payer: Self-pay | Admitting: Family

## 2014-05-15 VITALS — BP 168/78 | HR 54 | Temp 98.0°F | Resp 18 | Ht 74.0 in | Wt 181.1 lb

## 2014-05-15 DIAGNOSIS — H6980 Other specified disorders of Eustachian tube, unspecified ear: Secondary | ICD-10-CM | POA: Insufficient documentation

## 2014-05-15 DIAGNOSIS — H6983 Other specified disorders of Eustachian tube, bilateral: Secondary | ICD-10-CM

## 2014-05-15 MED ORDER — AMOXICILLIN 500 MG PO CAPS
500.0000 mg | ORAL_CAPSULE | Freq: Two times a day (BID) | ORAL | Status: DC
Start: 2014-05-15 — End: 2014-05-27

## 2014-05-15 NOTE — Progress Notes (Signed)
Pre visit review using our clinic review tool, if applicable. No additional management support is needed unless otherwise documented below in the visit note. 

## 2014-05-15 NOTE — Progress Notes (Signed)
   Subjective:    Patient ID: Logan [name], male    DOB: 09/01/1950, 63 y.o.   MRN: [id number]  Chief Complaint  Patient presents with  . ear issues    says he has an ear infection in both ears, x1 week, no pain and can barely hear    HPI:  Logan [name] is a 63 y.o. male who presents today for an acute visit.   Acute symptoms of ear issues started about one week ago. Patient indicates he has no pain in either ear however can barely hear. Over the course of the past 5 days he states that it has not gotten any better. States that he has a chronic history of ear infections. States he felt slightly warm, but denies fever. Denies having tried any treatments. There is nothing that makes it better or worse.   Allergies  Allergen Reactions  . Amlodipine     edema   Current Outpatient Prescriptions on File Prior to Visit  Medication Sig Dispense Refill  . amLODipine (NORVASC) 10 MG tablet Take 10 mg by mouth daily.     . aspirin 81 MG tablet Take 81 mg by mouth 2 (two) times daily.     . cloNIDine (CATAPRES) 0.1 MG tablet Take 1 tablet (0.1 mg total) by mouth daily as needed. If BP is over 180 60 tablet 11  . insulin glargine (LANTUS) 100 UNIT/ML injection Inject 0.12 mLs (12 Units total) into the skin at bedtime. 10 mL 5  . insulin NPH-regular (NOVOLIN 70/30) (70-30) 100 UNIT/ML injection Inject 15-17 Units into the skin 2 (two) times daily with a meal. Total of 32 units per day    . levothyroxine (SYNTHROID, LEVOTHROID) 75 MCG tablet TAKE ONE TABLET BY MOUTH ONCE DAILY 90 tablet 3  . losartan (COZAAR) 100 MG tablet TAKE ONE TABLET BY MOUTH ONCE DAILY 90 tablet 3  . metoprolol (LOPRESSOR) 50 MG tablet TAKE ONE & ONE-HALF TABLETS BY MOUTH ONCE DAILY 135 tablet 3  . MOVIPREP 100 G SOLR Take 1 kit (200 g total) by mouth once. moviprep as directed. No substitutions 1 kit 0  . simvastatin (ZOCOR) 20 MG tablet Take 20 mg by mouth daily.      No current facility-administered medications on file  prior to visit.   Review of Systems    See HPI  Objective:    BP 168/78 mmHg  Pulse 54  Temp(Src) 98 F (36.7 C) (Oral)  Resp 18  Ht 6' 2" (1.88 m)  Wt 181 lb 1.9 oz (82.155 kg)  BMI 23.24 kg/m2  SpO2 97% Nursing note and vital signs reviewed.  Physical Exam  Constitutional: He is oriented to person, place, and time. He appears well-developed and well-nourished. No distress.  HENT:  Right Ear: Tympanic membrane, external ear and ear canal normal. Decreased hearing is noted.  Left Ear: Tympanic membrane, external ear and ear canal normal. Decreased hearing is noted.  Nose: Nose normal.  Mouth/Throat: Uvula is midline, oropharynx is clear and moist and mucous membranes are normal.  Cardiovascular: Normal rate, regular rhythm, normal heart sounds and intact distal pulses.   Pulmonary/Chest: Effort normal and breath sounds normal.  Neurological: He is alert and oriented to person, place, and time.  Skin: Skin is warm and dry.  Psychiatric: He has a normal mood and affect. His behavior is normal. Judgment and thought content normal.       Assessment & Plan:    

## 2014-05-15 NOTE — Assessment & Plan Note (Signed)
Symptoms and exam consistent with potential eustachian tube dysfunction. No obvious signs of inflammation or infection. Patient was adamant about having antibiotic. Discussed the proper indications for antibiotics, however patient insisted on having an antibiotic because he knew his ear was infected. Start amoxicillin. Follow up if symptoms worsen or fail to improve

## 2014-05-15 NOTE — Patient Instructions (Signed)
Thank you for choosing [facility].  Summary/Instructions:  Your prescription(s) have been submitted to your pharmacy. Please take as directed and contact our office if you believe you are having problem(s) with the medication(s).  If your symptoms worsen or fail to improve, please contact our office for further instruction, or in case of emergency go directly to the emergency room at the closest medical facility.      

## 2014-05-23 ENCOUNTER — Telehealth: Payer: Self-pay | Admitting: Family

## 2014-05-23 DIAGNOSIS — H9193 Unspecified hearing loss, bilateral: Secondary | ICD-10-CM

## 2014-05-23 NOTE — Telephone Encounter (Signed)
Please inform patient I have sent a referral to audiology for him.

## 2014-05-23 NOTE — Telephone Encounter (Signed)
Called patient at home # left vm advising for him to call us back to speakof note from [facility]

## 2014-05-23 NOTE — Telephone Encounter (Signed)
Pt came in stating he took all his Amoxicillin 500 MG you prescribed for him last week and it is not working.  His hearing is really bad and no pain involved.  Please advise.

## 2014-05-26 ENCOUNTER — Other Ambulatory Visit: Payer: Self-pay | Admitting: Family

## 2014-05-26 ENCOUNTER — Telehealth: Payer: Self-pay | Admitting: Family

## 2014-05-27 ENCOUNTER — Ambulatory Visit (INDEPENDENT_AMBULATORY_CARE_PROVIDER_SITE_OTHER): Payer: BC Managed Care – PPO | Admitting: Physician Assistant

## 2014-05-27 VITALS — BP 130/72 | HR 59 | Temp 97.6°F | Resp 18 | Ht 73.25 in | Wt 180.8 lb

## 2014-05-27 DIAGNOSIS — H6983 Other specified disorders of Eustachian tube, bilateral: Secondary | ICD-10-CM

## 2014-05-27 MED ORDER — CETIRIZINE HCL 10 MG PO TABS: 10.0000 mg | ORAL_TABLET | Freq: Every day | ORAL | Status: DC

## 2014-05-27 MED ORDER — FLUTICASONE PROPIONATE 50 MCG/ACT NA SUSP: 2.0000 | Freq: Every day | NASAL | Status: DC

## 2014-05-27 NOTE — Progress Notes (Signed)
Subjective:    Patient ID: Logan [name], male    DOB: [birth year], 63 y.o.   MRN: [id number]  HPI  This is a 63 year old male with PMH DM, HTN, CAD, HLD who is presenting with bilateral hearing loss x 16 days. Pt reports at the end of November he was having some URI symptoms - cough, nasal congestion and sneezing. These symptoms resolved. 16 days ago he woke with bilateral hearing loss. He saw his PCP at [facility] and was told he most likely has eustachian tube dysfunction. Pt had recurrent ear infections as a child and insisted this was an ear infection and he needed antibiotics. He was given amoxicillin. He is here stating the antibiotic did not help and his symptoms have remained the same. He is experiencing popping in his ears. He states "I know my ear tubes are clogged". He called his PCP and let him know and he was referred to audiology. Pt states he does not want to go because "it will be a waste of my time". He states he has hearing loss at baseline in bilateral ears.He denies otalgia, current URI symptoms, fever or chills. Last ear infection was 6 years ago. He had a right ear surgery in the 1960s.  Review of Systems  Constitutional: Negative for fever and chills.  HENT: Positive for hearing loss. Negative for congestion, ear pain, sinus pressure and sore throat.   Eyes: Negative for redness.  Respiratory: Negative for cough.   Skin: Negative for rash.  Allergic/Immunologic: Negative for environmental allergies.  Neurological: Negative for dizziness.   Patient Active Problem List   Diagnosis Date Noted  . Eustachian tube dysfunction 05/15/2014  . Unspecified hypothyroidism 01/08/2014  . Prostatitis, acute 01/08/2014  . Routine health maintenance 07/03/2012  . Pure hypercholesterolemia with target low density lipoprotein (LDL) less than 70 05/05/2009  . DIABETES MELLITUS, TYPE I 11/01/2007  . DIABETIC  RETINOPATHY 11/01/2007  . Hemochromatosis, hereditary 11/01/2007    . HYPERTENSION 11/01/2007  . CAD 11/01/2007   Prior to Admission medications   Medication Sig Start Date End Date Taking? Authorizing Provider  amLODipine (NORVASC) 10 MG tablet Take 10 mg by mouth daily.  12/23/13  Yes Historical Provider, MD  aspirin 81 MG tablet Take 81 mg by mouth 2 (two) times daily.    Yes Historical Provider, MD  cloNIDine (CATAPRES) 0.1 MG tablet Take 1 tablet (0.1 mg total) by mouth daily as needed. If BP is over 180 05/21/12  Yes Neena Rhymes, MD  insulin glargine (LANTUS) 100 UNIT/ML injection Inject 0.12 mLs (12 Units total) into the skin at bedtime. 12/17/13  Yes Janith Lima, MD  insulin NPH-regular (NOVOLIN 70/30) (70-30) 100 UNIT/ML injection Inject 15-17 Units into the skin 2 (two) times daily with a meal. Total of 32 units per day   Yes Historical Provider, MD  levothyroxine (SYNTHROID, LEVOTHROID) 75 MCG tablet TAKE ONE TABLET BY MOUTH ONCE DAILY 05/28/13  Yes Neena Rhymes, MD  losartan (COZAAR) 100 MG tablet TAKE ONE TABLET BY MOUTH ONCE DAILY 05/28/13  Yes Neena Rhymes, MD  metoprolol (LOPRESSOR) 50 MG tablet TAKE ONE & ONE-HALF TABLETS BY MOUTH ONCE DAILY 05/28/13  Yes Neena Rhymes, MD  simvastatin (ZOCOR) 20 MG tablet Take 20 mg by mouth daily.  10/30/13  Yes Historical Provider, MD                 Allergies  Allergen Reactions  . Amlodipine  edema   Patient's social and family history were reviewed.     Objective:   Physical Exam  Constitutional: He is oriented to person, place, and time. He appears well-developed and well-nourished. No distress.  HENT:  Head: Normocephalic and atraumatic.  Right Ear: Hearing, tympanic membrane, external ear and ear canal normal.  Left Ear: Hearing, external ear and ear canal normal.  Nose: Nose normal.  Mouth/Throat: Uvula is midline, oropharynx is clear and moist and mucous membranes are normal.  Ear lavage performed on right ear Amber fluid behind left TM, no erythema or purulence.   Eyes: Conjunctivae and lids are normal. Right eye exhibits no discharge. Left eye exhibits no discharge. No scleral icterus.  Cardiovascular: Normal rate, regular rhythm, intact distal pulses and normal pulses.   Pulmonary/Chest: Effort normal and breath sounds normal. No respiratory distress. He has no wheezes. He has no rhonchi. He has no rales.  Musculoskeletal: Normal range of motion.  Lymphadenopathy:       Head (right side): No preauricular and no posterior auricular adenopathy present.       Head (left side): No preauricular and no posterior auricular adenopathy present.    He has no cervical adenopathy.  Neurological: He is alert and oriented to person, place, and time.  Skin: Skin is warm, dry and intact. No lesion and no rash noted.  Psychiatric: He has a normal mood and affect. His speech is normal and behavior is normal. Thought content normal.   BP 130/72 mmHg  Pulse 59  Temp(Src) 97.6 F (36.4 C) (Oral)  Resp 18  Ht 6' 1.25" (1.861 m)  Wt 180 lb 12.8 oz (82.01 kg)  BMI 23.68 kg/m2  SpO2 99%     Assessment & Plan:  1. Eustachian tube dysfunction, bilateral This is likely eustachian tube dysfunction. Prescribed flonase and zyrtec. Counseled on disease. He is agreeable to try the below medications. If not improving in 3-4 weeks, he will make appt with his PCP and get referral to an ENT.  - fluticasone (FLONASE) 50 MCG/ACT nasal spray; Place 2 sprays into both nostrils daily.  Dispense: 16 g; Refill: 12 - cetirizine (ZYRTEC) 10 MG tablet; Take 1 tablet (10 mg total) by mouth daily.  Dispense: 30 tablet; Refill: Sabin Drenda Freeze, [facility]  05/27/2014

## 2014-05-27 NOTE — Patient Instructions (Signed)
Use nasal spray twice a day. Use zyrtec once a day. This can take up to 4 weeks to resolve. If not doing better, follow up with your PCP for possible referral to ENT.

## 2014-05-29 NOTE — Progress Notes (Signed)
The patient was discussed with me and I agree with the diagnosis and treatment plan.  

## 2014-06-26 ENCOUNTER — Other Ambulatory Visit: Payer: Self-pay

## 2014-06-26 MED ORDER — AMLODIPINE BESYLATE 10 MG PO TABS: 10.0000 mg | ORAL_TABLET | Freq: Every day | ORAL | Status: DC

## 2014-06-26 MED ORDER — METOPROLOL TARTRATE 50 MG PO TABS: ORAL_TABLET | ORAL | Status: DC

## 2014-07-15 ENCOUNTER — Other Ambulatory Visit: Payer: Self-pay | Admitting: Otolaryngology

## 2014-07-15 DIAGNOSIS — H6693 Otitis media, unspecified, bilateral: Secondary | ICD-10-CM

## 2014-07-15 DIAGNOSIS — H6690 Otitis media, unspecified, unspecified ear: Secondary | ICD-10-CM

## 2014-07-15 DIAGNOSIS — H9193 Unspecified hearing loss, bilateral: Secondary | ICD-10-CM

## 2014-07-15 DIAGNOSIS — H908 Mixed conductive and sensorineural hearing loss, unspecified: Secondary | ICD-10-CM

## 2014-07-15 DIAGNOSIS — H6983 Other specified disorders of Eustachian tube, bilateral: Secondary | ICD-10-CM

## 2014-07-18 ENCOUNTER — Ambulatory Visit
Admission: RE | Admit: 2014-07-18 | Discharge: 2014-07-18 | Disposition: A | Discharge status: Home / Self Care | Payer: BLUE CROSS/BLUE SHIELD | Source: Ambulatory Visit | Attending: Otolaryngology | Admitting: Otolaryngology

## 2014-07-18 DIAGNOSIS — H6693 Otitis media, unspecified, bilateral: Secondary | ICD-10-CM

## 2014-07-18 DIAGNOSIS — H908 Mixed conductive and sensorineural hearing loss, unspecified: Secondary | ICD-10-CM

## 2014-07-18 DIAGNOSIS — H9193 Unspecified hearing loss, bilateral: Secondary | ICD-10-CM

## 2014-07-18 DIAGNOSIS — H6983 Other specified disorders of Eustachian tube, bilateral: Secondary | ICD-10-CM

## 2014-07-18 DIAGNOSIS — H6690 Otitis media, unspecified, unspecified ear: Secondary | ICD-10-CM

## 2014-07-20 ENCOUNTER — Encounter: Payer: Self-pay | Admitting: Internal Medicine

## 2014-08-20 ENCOUNTER — Other Ambulatory Visit (INDEPENDENT_AMBULATORY_CARE_PROVIDER_SITE_OTHER): Payer: BLUE CROSS/BLUE SHIELD

## 2014-08-20 ENCOUNTER — Ambulatory Visit (INDEPENDENT_AMBULATORY_CARE_PROVIDER_SITE_OTHER): Payer: BLUE CROSS/BLUE SHIELD | Admitting: Internal Medicine

## 2014-08-20 ENCOUNTER — Encounter: Payer: Self-pay | Admitting: Internal Medicine

## 2014-08-20 VITALS — BP 142/70 | HR 61 | Temp 97.8°F | Resp 16 | Ht 73.25 in | Wt 187.0 lb

## 2014-08-20 DIAGNOSIS — E1059 Type 1 diabetes mellitus with other circulatory complications: Secondary | ICD-10-CM

## 2014-08-20 DIAGNOSIS — E1051 Type 1 diabetes mellitus with diabetic peripheral angiopathy without gangrene: Secondary | ICD-10-CM

## 2014-08-20 DIAGNOSIS — E10319 Type 1 diabetes mellitus with unspecified diabetic retinopathy without macular edema: Secondary | ICD-10-CM

## 2014-08-20 DIAGNOSIS — I10 Essential (primary) hypertension: Secondary | ICD-10-CM

## 2014-08-20 DIAGNOSIS — I70209 Unspecified atherosclerosis of native arteries of extremities, unspecified extremity: Secondary | ICD-10-CM

## 2014-08-20 DIAGNOSIS — E1065 Type 1 diabetes mellitus with hyperglycemia: Secondary | ICD-10-CM

## 2014-08-20 DIAGNOSIS — E7800 Pure hypercholesterolemia, unspecified: Secondary | ICD-10-CM

## 2014-08-20 DIAGNOSIS — IMO0002 Reserved for concepts with insufficient information to code with codable children: Secondary | ICD-10-CM

## 2014-08-20 DIAGNOSIS — N400 Enlarged prostate without lower urinary tract symptoms: Secondary | ICD-10-CM

## 2014-08-20 DIAGNOSIS — Z0000 Encounter for general adult medical examination without abnormal findings: Secondary | ICD-10-CM | POA: Diagnosis not present

## 2014-08-20 DIAGNOSIS — Z1211 Encounter for screening for malignant neoplasm of colon: Secondary | ICD-10-CM

## 2014-08-20 DIAGNOSIS — K635 Polyp of colon: Secondary | ICD-10-CM | POA: Insufficient documentation

## 2014-08-20 DIAGNOSIS — E038 Other specified hypothyroidism: Secondary | ICD-10-CM

## 2014-08-20 LAB — CBC WITH DIFFERENTIAL/PLATELET
Basophils Absolute: 0.1 10*3/uL (ref 0.0–0.1)
Basophils Relative: 0.7 % (ref 0.0–3.0)
EOS PCT: 5 % (ref 0.0–5.0)
Eosinophils Absolute: 0.5 10*3/uL (ref 0.0–0.7)
HEMATOCRIT: 44.9 % (ref 39.0–52.0)
HEMOGLOBIN: 15.4 g/dL (ref 13.0–17.0)
LYMPHS ABS: 1.8 10*3/uL (ref 0.7–4.0)
Lymphocytes Relative: 19.1 % (ref 12.0–46.0)
MCHC: 34.3 g/dL (ref 30.0–36.0)
MCV: 91.8 fl (ref 78.0–100.0)
MONOS PCT: 8.2 % (ref 3.0–12.0)
Monocytes Absolute: 0.8 10*3/uL (ref 0.1–1.0)
NEUTROS ABS: 6.2 10*3/uL (ref 1.4–7.7)
Neutrophils Relative %: 67 % (ref 43.0–77.0)
PLATELETS: 212 10*3/uL (ref 150.0–400.0)
RBC: 4.89 Mil/uL (ref 4.22–5.81)
RDW: 14.3 % (ref 11.5–15.5)
WBC: 9.3 10*3/uL (ref 4.0–10.5)

## 2014-08-20 LAB — LIPID PANEL
CHOL/HDL RATIO: 3
Cholesterol: 142 mg/dL (ref 0–200)
HDL: 41.2 mg/dL (ref >39.00)
LDL CALC: 79 mg/dL (ref 0–99)
NONHDL: 100.8
Triglycerides: 109 mg/dL (ref 0.0–149.0)
VLDL: 21.8 mg/dL (ref 0.0–40.0)

## 2014-08-20 LAB — URINALYSIS, ROUTINE W REFLEX MICROSCOPIC
Bilirubin Urine: NEGATIVE
Hgb urine dipstick: NEGATIVE
KETONES UR: NEGATIVE
LEUKOCYTES UA: NEGATIVE
Nitrite: NEGATIVE
PH: 6 (ref 5.0–8.0)
SPECIFIC GRAVITY, URINE: 1.015 (ref 1.000–1.030)
URINE GLUCOSE: NEGATIVE
Urobilinogen, UA: 0.2 (ref 0.0–1.0)

## 2014-08-20 LAB — COMPREHENSIVE METABOLIC PANEL
ALK PHOS: 142 U/L — AB (ref 39–117)
ALT: 27 U/L (ref 0–53)
AST: 31 U/L (ref 0–37)
Albumin: 4.3 g/dL (ref 3.5–5.2)
BUN: 13 mg/dL (ref 6–23)
CHLORIDE: 103 meq/L (ref 96–112)
CO2: 32 mEq/L (ref 19–32)
Calcium: 8.9 mg/dL (ref 8.4–10.5)
Creatinine, Ser: 1.04 mg/dL (ref 0.40–1.50)
GFR: 76.53 mL/min (ref >60.00)
Glucose, Bld: 86 mg/dL (ref 70–99)
Potassium: 3.7 mEq/L (ref 3.5–5.1)
Sodium: 142 mEq/L (ref 135–145)
Total Bilirubin: 0.8 mg/dL (ref 0.2–1.2)
Total Protein: 7 g/dL (ref 6.0–8.3)

## 2014-08-20 LAB — IBC PANEL
Iron: 128 ug/dL (ref 42–165)
Saturation Ratios: 51.4 % — ABNORMAL HIGH (ref 20.0–50.0)
TRANSFERRIN: 178 mg/dL — AB (ref 212.0–360.0)

## 2014-08-20 LAB — PSA: PSA: 1.61 ng/mL (ref 0.10–4.00)

## 2014-08-20 LAB — MICROALBUMIN / CREATININE URINE RATIO
Creatinine,U: 116.9 mg/dL
MICROALB UR: 13.9 mg/dL — AB (ref 0.0–1.9)
Microalb Creat Ratio: 11.9 mg/g (ref 0.0–30.0)

## 2014-08-20 LAB — TSH: TSH: 11.81 u[IU]/mL — ABNORMAL HIGH (ref 0.35–4.50)

## 2014-08-20 LAB — FECAL OCCULT BLOOD, GUAIAC: Fecal Occult Blood: NEGATIVE

## 2014-08-20 LAB — HEMOGLOBIN A1C: Hgb A1c MFr Bld: 9.6 % — ABNORMAL HIGH (ref 4.6–6.5)

## 2014-08-20 LAB — FERRITIN: Ferritin: 34.2 ng/mL (ref 22.0–322.0)

## 2014-08-20 MED ORDER — METOPROLOL TARTRATE 50 MG PO TABS: ORAL_TABLET | ORAL | Status: DC

## 2014-08-20 MED ORDER — LOSARTAN POTASSIUM 100 MG PO TABS: 100.0000 mg | ORAL_TABLET | Freq: Every day | ORAL | Status: DC

## 2014-08-20 MED ORDER — INSULIN GLARGINE 100 UNIT/ML ~~LOC~~ SOLN: 12.0000 [IU] | Freq: Every day | SUBCUTANEOUS | Status: DC

## 2014-08-20 MED ORDER — LEVOTHYROXINE SODIUM 100 MCG PO TABS: 100.0000 ug | ORAL_TABLET | Freq: Every day | ORAL | Status: DC

## 2014-08-20 MED ORDER — SIMVASTATIN 20 MG PO TABS: 20.0000 mg | ORAL_TABLET | Freq: Every day | ORAL | Status: DC

## 2014-08-20 MED ORDER — AMLODIPINE BESYLATE 10 MG PO TABS: 10.0000 mg | ORAL_TABLET | Freq: Every day | ORAL | Status: DC

## 2014-08-20 NOTE — Assessment & Plan Note (Signed)
I will check his A1C I have asked him to see ENDO to re-consider the use of an insulin pump Will monitor his renal function He has referred for an eye exam

## 2014-08-20 NOTE — Assessment & Plan Note (Signed)
Will check his FLP today to see if he has achieved his LDL goal

## 2014-08-20 NOTE — Assessment & Plan Note (Signed)
Will check his CBC and iron levels, if these are elevated then I will refer to hematology to consider phlebotomy

## 2014-08-20 NOTE — Assessment & Plan Note (Addendum)
His TSh is high, will increase his synthroid dose

## 2014-08-20 NOTE — Progress Notes (Signed)
Subjective:    Patient ID: Logan [name], male    DOB: 1951/04/20, 64 y.o.   MRN: [id number]  Thyroid Problem Presents for follow-up visit. Patient reports no anxiety, cold intolerance, constipation, depressed mood, diaphoresis, diarrhea, dry skin, fatigue, hair loss, heat intolerance, hoarse voice, leg swelling, nail problem, palpitations, tremors, visual change, weight gain or weight loss. The symptoms have been stable. Past treatments include levothyroxine. The treatment provided significant relief.      Review of Systems  Constitutional: Negative.  Negative for chills, weight loss, weight gain, diaphoresis, appetite change and fatigue.  HENT: Negative.  Negative for hoarse voice, trouble swallowing and voice change.   Eyes: Negative.   Respiratory: Negative.  Negative for apnea, cough, choking, chest tightness, shortness of breath, wheezing and stridor.   Cardiovascular: Negative.  Negative for chest pain, palpitations and leg swelling.  Gastrointestinal: Negative.  Negative for nausea, vomiting, abdominal pain, diarrhea, constipation and blood in stool.  Endocrine: Negative.  Negative for cold intolerance, heat intolerance, polydipsia, polyphagia and polyuria.  Genitourinary: Negative.  Negative for dysuria, urgency, frequency, decreased urine volume, difficulty urinating and genital sores.  Musculoskeletal: Negative.  Negative for myalgias, back pain, joint swelling and arthralgias.  Skin: Negative.  Negative for rash.  Allergic/Immunologic: Negative.   Neurological: Negative.  Negative for tremors.  Hematological: Negative.  Negative for adenopathy. Does not bruise/bleed easily.  Psychiatric/Behavioral: Negative.  Negative for agitation. The patient is not nervous/anxious.        Objective:   Physical Exam  Constitutional: He is oriented to person, place, and time. He appears well-developed and well-nourished. No distress.  HENT:  Head: Normocephalic and atraumatic.    Mouth/Throat: Oropharynx is clear and moist. No oropharyngeal exudate.  Eyes: Conjunctivae are normal. Right eye exhibits no discharge. Left eye exhibits no discharge. No scleral icterus.  Neck: Normal range of motion. Neck supple. No JVD present. No tracheal deviation present. No thyromegaly present.  Cardiovascular: Normal rate, regular rhythm, normal heart sounds and intact distal pulses.  Exam reveals no gallop and no friction rub.   No murmur heard. Pulmonary/Chest: Effort normal and breath sounds normal. No stridor. No respiratory distress. He has no wheezes. He has no rales. He exhibits no tenderness.  Abdominal: Soft. Bowel sounds are normal. He exhibits no distension and no mass. There is no tenderness. There is no rebound and no guarding. Hernia confirmed negative in the right inguinal area and confirmed negative in the left inguinal area.  Genitourinary: Rectum normal, testes normal and penis normal. Rectal exam shows no external hemorrhoid, no internal hemorrhoid, no fissure, no mass, no tenderness and anal tone normal. Guaiac negative stool. Prostate is enlarged (1+ smooth symm BPH). Prostate is not tender. Right testis shows no mass, no swelling and no tenderness. Right testis is descended. Cremasteric reflex is not absent on the right side. Left testis shows no mass, no swelling and no tenderness. Left testis is descended. Cremasteric reflex is not absent on the left side. Uncircumcised. No phimosis, paraphimosis, hypospadias, penile erythema or penile tenderness. No discharge found.  Musculoskeletal: Normal range of motion. He exhibits no edema or tenderness.  Lymphadenopathy:    He has no cervical adenopathy.       Right: No inguinal adenopathy present.       Left: No inguinal adenopathy present.  Neurological: He is oriented to person, place, and time.  Skin: Skin is warm and dry. No rash noted. He is not diaphoretic. No erythema. No pallor.  Psychiatric:  He has a normal mood and  affect. His behavior is normal. Judgment and thought content normal.  Vitals reviewed.     Lab Results  Component Value Date   WBC 11.0* 01/08/2014   HGB 13.7 01/08/2014   HCT 40.7 01/08/2014   PLT 234.0 01/08/2014   GLUCOSE 49* 01/08/2014   CHOL 120 01/08/2014   TRIG 61.0 01/08/2014   HDL 31.90* 01/08/2014   LDLCALC 76 01/08/2014   ALT 37 01/08/2014   AST 30 01/08/2014   NA 139 01/08/2014   K 4.0 01/08/2014   CL 102 01/08/2014   CREATININE 1.2 01/08/2014   BUN 13 01/08/2014   CO2 31 01/08/2014   TSH 4.54* 01/08/2014   PSA 1.77 01/08/2014   HGBA1C 8.7* 01/08/2014      Assessment & Plan:

## 2014-08-20 NOTE — Assessment & Plan Note (Signed)
Vaccines were reviewed and updated He was referred for a colonoscopy Exam done Labs ordered Pt ed material was given

## 2014-08-20 NOTE — Addendum Note (Signed)
Addended by: Janith Lima on: 08/20/2014 01:24 PM   Modules accepted: Orders, Medications

## 2014-08-20 NOTE — Progress Notes (Signed)
Pre visit review using our clinic review tool, if applicable. No additional management support is needed unless otherwise documented below in the visit note. 

## 2014-08-20 NOTE — Patient Instructions (Signed)

## 2014-08-20 NOTE — Assessment & Plan Note (Signed)
His BP is adequately well controlled Will monitor his lytes and renal function

## 2014-08-21 ENCOUNTER — Encounter: Payer: Self-pay | Admitting: Internal Medicine

## 2014-09-10 ENCOUNTER — Encounter: Payer: BLUE CROSS/BLUE SHIELD | Attending: Internal Medicine | Admitting: *Deleted

## 2014-09-10 ENCOUNTER — Encounter: Payer: Self-pay | Admitting: *Deleted

## 2014-09-10 VITALS — Ht 74.0 in | Wt 186.1 lb

## 2014-09-10 DIAGNOSIS — Z713 Dietary counseling and surveillance: Secondary | ICD-10-CM | POA: Insufficient documentation

## 2014-09-10 DIAGNOSIS — E1051 Type 1 diabetes mellitus with diabetic peripheral angiopathy without gangrene: Secondary | ICD-10-CM

## 2014-09-10 DIAGNOSIS — Z794 Long term (current) use of insulin: Secondary | ICD-10-CM | POA: Diagnosis not present

## 2014-09-10 DIAGNOSIS — I70209 Unspecified atherosclerosis of native arteries of extremities, unspecified extremity: Secondary | ICD-10-CM | POA: Insufficient documentation

## 2014-09-10 DIAGNOSIS — E1059 Type 1 diabetes mellitus with other circulatory complications: Secondary | ICD-10-CM | POA: Insufficient documentation

## 2014-09-10 NOTE — Patient Instructions (Signed)
Plan:  Aim for 4 Carb Choices per meal (60 grams) +/- 1 either way  Aim for 0-2 Carbs per snack if hungry  Include protein in moderation with your meals and snacks Consider reading food labels for Total Carbohydrate and Fat Grams of foods Continue with your activity level daily as tolerated Continue checking BG y as directed by MD  Continue taking medication Lantus and Novolin 70/30 insulin as directed by MD

## 2014-09-12 NOTE — Progress Notes (Signed)
Diabetes Self-Management Education  Visit Type:  Initial  Appt. Start Time: 0830 Appt. End Time: 1000  09/12/2014  [name], identified by name and date of birth, is a 64 y.o. male with a diagnosis of Diabetes: Type 1.  Other people present during visit:  Patient   ASSESSMENT  Height 6\' 2"  (1.88 m), weight 186 lb 1.6 oz (84.414 kg). Body mass index is 23.88 kg/(m^2).  Initial Visit Information:  Are you currently following a meal plan?: No   Are you taking your medications as prescribed?: Yes Are you checking your feet?: Yes How many days per week are you checking your feet?: 7 How often do you need to have someone help you when you read instructions, pamphlets, or other written materials from your doctor or pharmacy?: 1 - Never    Psychosocial:     Patient Belief/Attitude about Diabetes: Other (comment) (it's a fact of my life, no attitude right now) Self-care barriers: Other (comment) (neuropathy affects how far he can walk) Other persons present: Patient Preferred Learning Style: Auditory, Visual, Hands on  Complications:   Last HgB A1C per patient/outside source: 9.6 % How often do you check your blood sugar?: 1-2 times/day (more sometimes) 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 coffee with Splenda or water Snack (morning): tries not too, maybe pound cake Lunch: can be anything, usually a lean meat maybe some crackers or fresh fruit Snack (afternoon): same as AM Dinner: meat with green vegetable, occasionally with starch, water Snack (evening): left overs Beverage(s): coffee, water, occasionally milk  Exercise:  Exercise: Moderate (swimming / aerobic walking) (hunts, works outside  often)  Individualized Plan for Diabetes Self-Management Training:   Learning Objective:  Patient will have a greater understanding of diabetes self-management.  Patient education plan per assessed needs and concerns is to attend individual sessions for     Education Topics Reviewed with Patient Today:  Explored patient's options for treatment of their diabetes (Patient recently tried V-Go for insulin delivery, but lowest basal choice still caused hypoglyecemia so he is back on injections now. ) Role of diet in the treatment of diabetes and the relationship between the three main macronutrients and blood glucose level, Carbohydrate counting, Food label reading, portion sizes and measuring food. Helped patient identify appropriate exercises in relation to his/her diabetes, diabetes complications and other health issue. (Discussed option for exercise other than walking due to neuropathy and toe amputation) Reviewed patients medication for diabetes, action, purpose, timing of dose and side effects., Other (comment) (Did short version of Intro to Pumping to show him the potential benefits. Also demonostrated how to give a bolus on pump) Identified appropriate SMBG and/or A1C goals.   Assessed and discussed foot care and prevention of foot problems        PATIENTS GOALS/Plan (Developed by the patient):  Nutrition: Follow meal plan discussed Physical Activity: 15 minutes per day (consider Arm Chair Exercises) Medications: Other (comment) Monitoring : test blood glucose pre and post meals as discussed  Plan:   Patient Instructions  Plan:  Aim for 4 Carb Choices per meal (60 grams) +/- 1 either way  Aim for 0-2 Carbs per snack if hungry  Include protein in moderation with your  meals and snacks Consider reading food labels for Total Carbohydrate and Fat Grams of foods Continue with your activity level daily as tolerated Continue checking BG y as directed by MD  Continue taking medication  Lantus and Novolin 70/30 insulin as directed by MD      Expected Outcomes:  Demonstrated limited interest in learning.  Expect minimal changes  Education material provided: Meal plan card and Carbohydrate counting sheet, Intro to Pumping  If problems or questions, patient to contact team via:  Phone and Email  Future DSME appointment: 4-6 wks

## 2014-10-02 ENCOUNTER — Ambulatory Visit (AMBULATORY_SURGERY_CENTER): Payer: Self-pay | Admitting: *Deleted

## 2014-10-02 ENCOUNTER — Telehealth: Payer: Self-pay

## 2014-10-02 VITALS — Ht 74.0 in | Wt 184.8 lb

## 2014-10-02 DIAGNOSIS — Z1211 Encounter for screening for malignant neoplasm of colon: Secondary | ICD-10-CM

## 2014-10-02 MED ORDER — NA SULFATE-K SULFATE-MG SULF 17.5-3.13-1.6 GM/177ML PO SOLN
1.0000 | Freq: Once | ORAL | Status: DC
Start: 2014-10-02 — End: 2014-10-16

## 2014-10-02 NOTE — Progress Notes (Signed)
Denies allergies to eggs or soy products. Denies complications with sedation or anesthesia. Denies O2 use. Denies use of diet or weight loss medications.  Emmi instructions declined for colonoscopy.  

## 2014-10-02 NOTE — Telephone Encounter (Signed)
Patient came by office and stated he never received any feedback from labs---i could not find a note advising any info to give patient---i printed copy of labs for patient to take with him per pt request and i will call him back with any comments you have about labs, thanks

## 2014-10-03 NOTE — Telephone Encounter (Signed)
Blood sugars were high Thyroid test was abnormal - please follow up soon for a thyroid recheck

## 2014-10-06 NOTE — Telephone Encounter (Signed)
Advised patient of dr Ronnald Ramp note, patient will call back to schedule appt to talk with dr Ronnald Ramp about abnormal labs

## 2014-10-07 ENCOUNTER — Ambulatory Visit (INDEPENDENT_AMBULATORY_CARE_PROVIDER_SITE_OTHER): Payer: BLUE CROSS/BLUE SHIELD | Admitting: Internal Medicine

## 2014-10-07 ENCOUNTER — Other Ambulatory Visit (INDEPENDENT_AMBULATORY_CARE_PROVIDER_SITE_OTHER): Payer: BLUE CROSS/BLUE SHIELD

## 2014-10-07 ENCOUNTER — Encounter: Payer: Self-pay | Admitting: Internal Medicine

## 2014-10-07 VITALS — BP 140/70 | HR 57 | Temp 97.8°F | Resp 16 | Ht 74.0 in | Wt 183.0 lb

## 2014-10-07 DIAGNOSIS — E7800 Pure hypercholesterolemia, unspecified: Secondary | ICD-10-CM

## 2014-10-07 DIAGNOSIS — E038 Other specified hypothyroidism: Secondary | ICD-10-CM | POA: Diagnosis not present

## 2014-10-07 DIAGNOSIS — E10319 Type 1 diabetes mellitus with unspecified diabetic retinopathy without macular edema: Secondary | ICD-10-CM

## 2014-10-07 DIAGNOSIS — E1051 Type 1 diabetes mellitus with diabetic peripheral angiopathy without gangrene: Secondary | ICD-10-CM

## 2014-10-07 DIAGNOSIS — E1065 Type 1 diabetes mellitus with hyperglycemia: Secondary | ICD-10-CM | POA: Diagnosis not present

## 2014-10-07 DIAGNOSIS — IMO0002 Reserved for concepts with insufficient information to code with codable children: Secondary | ICD-10-CM

## 2014-10-07 DIAGNOSIS — E1059 Type 1 diabetes mellitus with other circulatory complications: Secondary | ICD-10-CM | POA: Diagnosis not present

## 2014-10-07 DIAGNOSIS — I10 Essential (primary) hypertension: Secondary | ICD-10-CM

## 2014-10-07 DIAGNOSIS — I70209 Unspecified atherosclerosis of native arteries of extremities, unspecified extremity: Secondary | ICD-10-CM

## 2014-10-07 LAB — COMPREHENSIVE METABOLIC PANEL
ALBUMIN: 3.9 g/dL (ref 3.5–5.2)
ALT: 23 U/L (ref 0–53)
AST: 20 U/L (ref 0–37)
Alkaline Phosphatase: 149 U/L — ABNORMAL HIGH (ref 39–117)
BUN: 17 mg/dL (ref 6–23)
CALCIUM: 9.3 mg/dL (ref 8.4–10.5)
CO2: 32 meq/L (ref 19–32)
Chloride: 102 mEq/L (ref 96–112)
Creatinine, Ser: 1.19 mg/dL (ref 0.40–1.50)
GFR: 65.48 mL/min (ref >60.00)
GLUCOSE: 227 mg/dL — AB (ref 70–99)
Potassium: 3.9 mEq/L (ref 3.5–5.1)
Sodium: 139 mEq/L (ref 135–145)
Total Bilirubin: 0.8 mg/dL (ref 0.2–1.2)
Total Protein: 7.2 g/dL (ref 6.0–8.3)

## 2014-10-07 LAB — TSH: TSH: 2.19 u[IU]/mL (ref 0.35–4.50)

## 2014-10-07 LAB — HEMOGLOBIN A1C: Hgb A1c MFr Bld: 9 % — ABNORMAL HIGH (ref 4.6–6.5)

## 2014-10-07 LAB — CK: CK TOTAL: 227 U/L (ref 7–232)

## 2014-10-07 NOTE — Progress Notes (Signed)
Pre visit review using our clinic review tool, if applicable. No additional management support is needed unless otherwise documented below in the visit note. 

## 2014-10-07 NOTE — Progress Notes (Signed)
   Subjective:    Patient ID: Logan [name], male    DOB: 1950-07-25, 64 y.o.   MRN: [id number]  Thyroid Problem Presents for follow-up visit. Symptoms include weight loss. Patient reports no anxiety, cold intolerance, constipation, depressed mood, diaphoresis, diarrhea, dry skin, fatigue, hair loss, heat intolerance, hoarse voice, leg swelling, nail problem, palpitations, tremors, visual change or weight gain. The symptoms have been stable. Past treatments include levothyroxine. The treatment provided significant relief.      Review of Systems  Constitutional: Positive for weight loss. Negative for fever, chills, weight gain, diaphoresis, appetite change and fatigue.  HENT: Negative.  Negative for hoarse voice.   Eyes: Negative.   Respiratory: Negative.  Negative for cough, choking, chest tightness, shortness of breath and stridor.   Cardiovascular: Negative.  Negative for chest pain, palpitations and leg swelling.  Gastrointestinal: Negative.  Negative for nausea, vomiting, abdominal pain, diarrhea and constipation.  Endocrine: Negative.  Negative for cold intolerance, heat intolerance, polydipsia, polyphagia and polyuria.  Genitourinary: Negative.   Musculoskeletal: Negative.  Negative for myalgias, back pain, arthralgias and neck stiffness.  Skin: Negative.   Allergic/Immunologic: Negative.   Neurological: Negative.  Negative for dizziness and tremors.  Hematological: Negative.  Negative for adenopathy. Does not bruise/bleed easily.  Psychiatric/Behavioral: Negative.  The patient is not nervous/anxious.        Objective:   Physical Exam  Constitutional: He is oriented to person, place, and time. He appears well-developed and well-nourished. No distress.  HENT:  Head: Normocephalic and atraumatic.  Mouth/Throat: Oropharynx is clear and moist. No oropharyngeal exudate.  Eyes: Conjunctivae are normal. Right eye exhibits no discharge. Left eye exhibits no discharge. No scleral  icterus.  Neck: Normal range of motion. Neck supple. No JVD present. No tracheal deviation present. No thyromegaly present.  Cardiovascular: Normal rate, regular rhythm, normal heart sounds and intact distal pulses.  Exam reveals no gallop and no friction rub.   No murmur heard. Pulmonary/Chest: Effort normal and breath sounds normal. No stridor. No respiratory distress. He has no wheezes. He has no rales. He exhibits no tenderness.  Abdominal: Soft. Bowel sounds are normal. He exhibits no distension and no mass. There is no tenderness. There is no rebound and no guarding.  Musculoskeletal: Normal range of motion. He exhibits no edema or tenderness.  Lymphadenopathy:    He has no cervical adenopathy.  Neurological: He is oriented to person, place, and time.  Skin: Skin is warm and dry. No rash noted. He is not diaphoretic. No erythema. No pallor.  Vitals reviewed.    Lab Results  Component Value Date   WBC 9.3 08/20/2014   HGB 15.4 08/20/2014   HCT 44.9 08/20/2014   PLT 212.0 08/20/2014   GLUCOSE 86 08/20/2014   CHOL 142 08/20/2014   TRIG 109.0 08/20/2014   HDL 41.20 08/20/2014   LDLCALC 79 08/20/2014   ALT 27 08/20/2014   AST 31 08/20/2014   NA 142 08/20/2014   K 3.7 08/20/2014   CL 103 08/20/2014   CREATININE 1.04 08/20/2014   BUN 13 08/20/2014   CO2 32 08/20/2014   TSH 11.81* 08/20/2014   PSA 1.61 08/20/2014   HGBA1C 9.6* 08/20/2014   MICROALBUR 13.9* 08/20/2014       Assessment & Plan:

## 2014-10-07 NOTE — Patient Instructions (Signed)
Hypothyroidism The thyroid is a large gland located in the lower front of your neck. The thyroid gland helps control metabolism. Metabolism is how your body handles food. It controls metabolism with the hormone thyroxine. When this gland is underactive (hypothyroid), it produces too little hormone.  CAUSES These include:   Absence or destruction of thyroid tissue.  Goiter due to iodine deficiency.  Goiter due to medications.  Congenital defects (since birth).  Problems with the pituitary. This causes a lack of TSH (thyroid stimulating hormone). This hormone tells the thyroid to turn out more hormone. SYMPTOMS  Lethargy (feeling as though you have no energy)  Cold intolerance  Weight gain (in spite of normal food intake)  Dry skin  Coarse hair  Menstrual irregularity (if severe, may lead to infertility)  Slowing of thought processes Cardiac problems are also caused by insufficient amounts of thyroid hormone. Hypothyroidism in the newborn is cretinism, and is an extreme form. It is important that this form be treated adequately and immediately or it will lead rapidly to retarded physical and mental development. DIAGNOSIS  To prove hypothyroidism, your caregiver may do blood tests and ultrasound tests. Sometimes the signs are hidden. It may be necessary for your caregiver to watch this illness with blood tests either before or after diagnosis and treatment. TREATMENT  Low levels of thyroid hormone are increased by using synthetic thyroid hormone. This is a safe, effective treatment. It usually takes about four weeks to gain the full effects of the medication. After you have the full effect of the medication, it will generally take another four weeks for problems to leave. Your caregiver may start you on low doses. If you have had heart problems the dose may be gradually increased. It is generally not an emergency to get rapidly to normal. HOME CARE INSTRUCTIONS   Take your  medications as your caregiver suggests. Let your caregiver know of any medications you are taking or start taking. Your caregiver will help you with dosage schedules.  As your condition improves, your dosage needs may increase. It will be necessary to have continuing blood tests as suggested by your caregiver.  Report all suspected medication side effects to your caregiver. SEEK MEDICAL CARE IF: Seek medical care if you develop:  Sweating.  Tremulousness (tremors).  Anxiety.  Rapid weight loss.  Heat intolerance.  Emotional swings.  Diarrhea.  Weakness. SEEK IMMEDIATE MEDICAL CARE IF:  You develop chest pain, an irregular heart beat (palpitations), or a rapid heart beat. MAKE SURE YOU:   Understand these instructions.  Will watch your condition.  Will get help right away if you are not doing well or get worse. Document Released: 05/23/2005 Document Revised: 08/15/2011 Document Reviewed: 01/11/2008 Exit[employer] Patient Information 2015 [employer]. This information is not intended to replace advice given to you by your health care provider. Make sure you discuss any questions you have with your health care provider.  

## 2014-10-08 ENCOUNTER — Encounter: Payer: Self-pay | Admitting: Internal Medicine

## 2014-10-08 NOTE — Assessment & Plan Note (Signed)
His blood sugars have not been well controlled and he wants to reconsider being in an insulin pump Will refer him back to ENDO

## 2014-10-08 NOTE — Assessment & Plan Note (Signed)
His BP is well controlled Lytes and renal function are stable 

## 2014-10-08 NOTE — Assessment & Plan Note (Signed)
His TSh is in the normal Will cont the current synthroid dose

## 2014-10-16 ENCOUNTER — Encounter: Payer: Self-pay | Admitting: Internal Medicine

## 2014-10-16 ENCOUNTER — Ambulatory Visit (AMBULATORY_SURGERY_CENTER): Payer: BLUE CROSS/BLUE SHIELD | Admitting: Internal Medicine

## 2014-10-16 ENCOUNTER — Other Ambulatory Visit: Payer: Self-pay | Admitting: Internal Medicine

## 2014-10-16 VITALS — BP 146/77 | HR 62 | Temp 96.3°F | Resp 18 | Ht 74.0 in | Wt 184.0 lb

## 2014-10-16 DIAGNOSIS — Z1211 Encounter for screening for malignant neoplasm of colon: Secondary | ICD-10-CM | POA: Diagnosis not present

## 2014-10-16 DIAGNOSIS — D124 Benign neoplasm of descending colon: Secondary | ICD-10-CM

## 2014-10-16 DIAGNOSIS — D120 Benign neoplasm of cecum: Secondary | ICD-10-CM | POA: Diagnosis not present

## 2014-10-16 DIAGNOSIS — D122 Benign neoplasm of ascending colon: Secondary | ICD-10-CM | POA: Diagnosis not present

## 2014-10-16 DIAGNOSIS — D125 Benign neoplasm of sigmoid colon: Secondary | ICD-10-CM

## 2014-10-16 LAB — GLUCOSE, CAPILLARY
Glucose-Capillary: 148 mg/dL — ABNORMAL HIGH (ref 65–99)
Glucose-Capillary: 128 mg/dL — ABNORMAL HIGH (ref 65–99)

## 2014-10-16 MED ORDER — SODIUM CHLORIDE 0.9 % IV SOLN: 500.0000 mL | INTRAVENOUS | Status: DC

## 2014-10-16 NOTE — Op Note (Signed)
[facility]  [address] [place], [id number]   COLONOSCOPY PROCEDURE REPORT  PATIENT: Logan [name], Logan [name]  MR#: [id number] BIRTHDATE: 20-Apr-1951 , 20  yrs. old GENDER: male ENDOSCOPIST: Jerene Bears, MD REFERRED IW:[id number] Evalina Field, M.D. PROCEDURE DATE:  10/16/2014 PROCEDURE:   Colonoscopy, screening, Colonoscopy with cold biopsy polypectomy, and Colonoscopy with snare polypectomy First Screening Colonoscopy - Avg.  risk and is 50 yrs.  old or older Yes.  Prior Negative Screening - Now for repeat screening. N/A  History of Adenoma - Now for follow-up colonoscopy & has been > or = to 3 yrs.  N/A  Polyps removed today = YES ASA CLASS:   Class II INDICATIONS:Screening for colonic neoplasia and Colorectal Neoplasm Risk Assessment for this procedure is average risk. MEDICATIONS: Monitored anesthesia care and Propofol 220 mg IV  DESCRIPTION OF PROCEDURE:   After the risks benefits and alternatives of the procedure were thoroughly explained, informed consent was obtained.  The digital rectal exam revealed no rectal mass.   The LB CF-H180AL Loaner [id number]  endoscope was introduced through the anus and advanced to the cecum, which was identified by both the appendix and ileocecal valve. No adverse events experienced.   The quality of the prep was (Suprep was used) fair clearing to good with copious irrigation and lavage.  The instrument was then slowly withdrawn as the colon was fully examined.  COLON FINDINGS: Three sessile polyps ranging between 3-41mm in size were found at the cecum and in the ascending colon.  Polypectomies were performed with cold forceps and with a cold snare.  The resection was complete, the polyp tissue was completely retrieved and sent to histology.   A semi-pedunculated polyp measuring 8 mm in size was found in the descending colon.  A polypectomy was performed using snare cautery.  The resection was complete, the polyp tissue was completely  retrieved and sent to histology. Retroflexed views revealed external hemorrhoids. The time to cecum = 5.9 Withdrawal time = 17.5   The scope was withdrawn and the procedure completed. COMPLICATIONS: There were no immediate complications.  ENDOSCOPIC IMPRESSION: 1.   Three sessile polyps ranging between 3-12mm in size were found at the cecum and in the ascending colon; polypectomies were performed with cold forceps and with a cold snare 2.   Semi-pedunculated polyp was found in the descending colon; polypectomy was performed using snare cautery  RECOMMENDATIONS: 1.  Await pathology results 2.  If the polyps removed today are proven to be adenomatous (pre-cancerous) polyps, you will need a colonoscopy in 3 years. Otherwise you should continue to follow colorectal cancer screening guidelines for "routine risk" patients with a colonoscopy in 10 years.  You will receive a letter within 1-2 weeks with the results of your biopsy as well as final recommendations.  Please call my office if you have not received a letter after 3 weeks.  eSigned:  Jerene Bears, MD 2014-10-16 11:[id number]   cc: Janith Lima, MD and The Patient

## 2014-10-16 NOTE — Patient Instructions (Signed)
YOU HAD AN ENDOSCOPIC PROCEDURE TODAY AT THE [facility]:   Refer to the procedure report that was given to you for any specific questions about what was found during the examination.  If the procedure report does not answer your questions, please call your gastroenterologist to clarify.  If you requested that your care partner not be given the details of your procedure findings, then the procedure report has been included in a sealed envelope for you to review at your convenience later.  YOU SHOULD EXPECT: Some feelings of bloating in the abdomen. Passage of more gas than usual.  Walking can help get rid of the air that was put into your GI tract during the procedure and reduce the bloating. If you had a lower endoscopy (such as a colonoscopy or flexible sigmoidoscopy) you may notice spotting of blood in your stool or on the toilet paper. If you underwent a bowel prep for your procedure, you may not have a normal bowel movement for a few days.  Please Note:  You might notice some irritation and congestion in your nose or some drainage.  This is from the oxygen used during your procedure.  There is no need for concern and it should clear up in a day or so.  SYMPTOMS TO REPORT IMMEDIATELY:   Following lower endoscopy (colonoscopy or flexible sigmoidoscopy):  Excessive amounts of blood in the stool  Significant tenderness or worsening of abdominal pains  Swelling of the abdomen that is new, acute  Fever of 100F or higher   For urgent or emergent issues, a gastroenterologist can be reached at any hour by calling [phone].   DIET: Your first meal following the procedure should be a small meal and then it is ok to progress to your normal diet. Heavy or fried foods are harder to digest and may make you feel nauseous or bloated.  Likewise, meals heavy in dairy and vegetables can increase bloating.  Drink plenty of fluids but you should avoid alcoholic beverages for 24  hours.  ACTIVITY:  You should plan to take it easy for the rest of today and you should NOT DRIVE or use heavy machinery until tomorrow (because of the sedation medicines used during the test).    FOLLOW UP: Our staff will call the number listed on your records the next business day following your procedure to check on you and address any questions or concerns that you may have regarding the information given to you following your procedure. If we do not reach you, we will leave a message.  However, if you are feeling well and you are not experiencing any problems, there is no need to return our call.  We will assume that you have returned to your regular daily activities without incident.  If any biopsies were taken you will be contacted by phone or by letter within the next 1-3 weeks.  Please call us at [phone] if you have not heard about the biopsies in 3 weeks.    SIGNATURES/CONFIDENTIALITY: You and/or your care partner have signed paperwork which will be entered into your electronic medical record.  These signatures attest to the fact that that the information above on your After Visit Summary has been reviewed and is understood.  Full responsibility of the confidentiality of this discharge information lies with you and/or your care-partner.  Polyps-handout given  Repeat colonoscopy will be determined by pathology.  

## 2014-10-16 NOTE — Progress Notes (Signed)
Called to room to assist during endoscopic procedure.  Patient ID and intended procedure confirmed with present staff. Received instructions for my participation in the procedure from the performing physician.  

## 2014-10-16 NOTE — Progress Notes (Signed)
Report to PACU, RN, vss, BBS= Clear.  

## 2014-10-17 ENCOUNTER — Telehealth: Payer: Self-pay | Admitting: *Deleted

## 2014-10-17 NOTE — Telephone Encounter (Signed)
  Follow up Call-  Call back number 10/16/2014  Post procedure Call Back phone  # [phone]  Permission to leave phone message Yes     Patient questions:  Do you have a fever, pain , or abdominal swelling? No. Pain Score  0 *  Have you tolerated food without any problems? Yes.    Have you been able to return to your normal activities? Yes.    Do you have any questions about your discharge instructions: Diet   No. Medications  No. Follow up visit  No.  Do you have questions or concerns about your Care? No.  Actions: * If pain score is 4 or above: No action needed, pain <4.

## 2014-10-19 LAB — HM COLONOSCOPY

## 2014-10-19 NOTE — Addendum Note (Signed)
Addended by: Janith Lima on: 10/19/2014 12:05 PM   Modules accepted: Miquel Dunn

## 2014-10-22 ENCOUNTER — Encounter: Payer: Self-pay | Admitting: *Deleted

## 2014-10-22 ENCOUNTER — Encounter: Payer: BLUE CROSS/BLUE SHIELD | Attending: Internal Medicine | Admitting: *Deleted

## 2014-10-22 ENCOUNTER — Encounter: Payer: Self-pay | Admitting: Internal Medicine

## 2014-10-22 VITALS — Ht 74.0 in | Wt 182.2 lb

## 2014-10-22 DIAGNOSIS — Z794 Long term (current) use of insulin: Secondary | ICD-10-CM | POA: Diagnosis not present

## 2014-10-22 DIAGNOSIS — I70209 Unspecified atherosclerosis of native arteries of extremities, unspecified extremity: Secondary | ICD-10-CM | POA: Insufficient documentation

## 2014-10-22 DIAGNOSIS — Z713 Dietary counseling and surveillance: Secondary | ICD-10-CM | POA: Diagnosis not present

## 2014-10-22 DIAGNOSIS — E1059 Type 1 diabetes mellitus with other circulatory complications: Secondary | ICD-10-CM | POA: Diagnosis not present

## 2014-10-22 DIAGNOSIS — E1051 Type 1 diabetes mellitus with diabetic peripheral angiopathy without gangrene: Secondary | ICD-10-CM

## 2014-10-22 NOTE — Progress Notes (Deleted)
Diabetes Self-Management Education  Visit Type:  Initial  Appt. Start Time: 0830 Appt. End Time: 1000  10/22/2014  [name], identified by name and date of birth, is a 64 y.o. male with a diagnosis of Diabetes:  .  Other people present during visit:      ASSESSMENT  Height 6\' 2"  (1.88 m), weight 182 lb 3.2 oz (82.645 kg). Body mass index is 23.38 kg/(m^2).  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  Patient education plan per assessed needs and concerns is to attend individual sessions for     Education Topics Reviewed with Patient Today:                       PATIENTS GOALS/Plan (Developed by the patient):     Plan:   There are no Patient Instructions on file for this visit.  Expected Outcomes:     Education material provided: Meal plan card and Carbohydrate counting sheet, Intro to Pumping  If problems or questions, patient to contact team via:  Phone and Email  Future DSME appointment:

## 2014-10-22 NOTE — Patient Instructions (Addendum)
Plan:  Aim for 4 Carb Choices per meal (60 grams) +/- 1 either way  Aim for 0-2 Carbs per snack if hungry  Include protein in moderation with your meals and snacks Continue reading food labels for Total Carbohydrate and Fat Grams of foods Continue with your activity level daily as tolerated Continue checking BG y as directed by MD  Continue taking medication Lantus and Novolin 70/30 insulin as directed by MD We discussed the idea of obtaining Regular or Analog insulin to use to correct high BG's more efficiently and quickly you're interested I showed you several insulin pumps and demonstrated how to give a bolus by providing it with your carb intake and BG for more accurate insulin delivery Consider attending the DM 1 Support Group that meets the 1st Thursday of every month.

## 2014-10-22 NOTE — Progress Notes (Signed)
Diabetes Self-Management Education  Visit Type:   Follow up  Appt. Start Time: 0830 Appt. End Time: 0930  10/22/2014  [name], identified by name and date of birth, is a 64 y.o. male with a diagnosis of Diabetes: Type 1.  Other people present during visit:  Patient   ASSESSMENT  Height 6\' 2"  (1.88 m), weight 182 lb 3.2 oz (82.645 kg). Body mass index is 23.38 kg/(m^2).    Subsequent Visit Information:  Since your last visit, have you continued or began the use of a meal plan?: No Since your last visit, have you continued or began to exercise on a consistent basis?: Yes How many days per week are you exercising or participating in a physicial activity for more than 20 minutes?: 5 Since your last visit have you continued or begun to take your medications as prescribed?: Yes Since your last visit have you experienced any weight changes?: Loss Weight Loss (lbs): 4 Since your last visit, are you checking your blood glucose at least once a day?: Yes  Psychosocial:     Other persons present: Patient Patient Concerns: Medication, Nutrition/Meal planning Special Needs: None Preferred Learning Style: Auditory, Visual, Hands on Learning Readiness: Contemplating  Complications:   Last HgB A1C per patient/outside source: 9.0 % (Improved from 9.6%) How often do you check your blood sugar?: 1-2 times/day Have you had a dilated eye exam in the past 12 months?: Yes Have you had a dental exam in the past 12 months?: No  Diet Intake:     Exercise:  Exercise: Moderate (swimming / aerobic walking) (is active outside, enjoys hunting)   Individualized Plan for Diabetes Self-Management Training:   Learning Objective:  Patient will have a greater understanding of diabetes self-management.  Patient education plan per assessed needs and concerns is to attend individual sessions for    Education Topics Reviewed with Patient Today:    Carbohydrate counting Helped patient  identify appropriate exercises in relation to his/her diabetes, diabetes complications and other health issue. Other (comment) (reviewed insulin action of Lantus and Novolin 70/30. Explained he has no way to correct high BG with current insuiln and the rationale of using Regular or Analog insulin alone to correct high BG as needed if OK with MD)   Taught treatment of hypoglycemia - the 15 rule.          PATIENTS GOALS/Plan (Developed by the patient):  Nutrition: General guidelines for healthy choices and portions discussed Physical Activity: 15 minutes per day Medications: take my medication as prescribed (Consider addition of Regular or Analog insulin to use for correction of high BG. Consider insuiln pump therapy in the future) Monitoring : test my blood glucose as discussed (note x per day with comment)  Patient Self Evaluation of Goals - Patient rates self as meeting previously set goals:   Nutrition: 50 - 75 % Physical Activity: >75% Medications: >75% Monitoring: 25 - 50% Problem Solving: 50 - 75 % Reducing Risk: 25 - 50% Health Coping: >75%   Plan:   Patient Instructions  Plan:  Aim for 4 Carb Choices per meal (60 grams) +/- 1 either way  Aim for 0-2 Carbs per snack if hungry  Include protein in moderation with your meals and snacks Continue reading food labels for Total Carbohydrate and Fat Grams of foods Continue with your activity level daily as tolerated Continue checking BG y as directed by MD  Continue taking medication Lantus and Novolin 70/30 insulin as directed by MD We discussed  the idea of obtaining Regular or Analog insulin to use to correct high BG's more efficiently and quickly you're interested I showed you several insulin pumps and demonstrated how to give a bolus by providing it with your carb intake and BG for more accurate insulin delivery Consider attending the DM 1 Support Group that meets the 1st Thursday of every month.       Expected  Outcomes:  Demonstrated limited interest in learning.  Expect minimal changes  Education material provided: Insulin Action handout, Intensive Insulin handout, DM 1 Support Group handout  If problems or questions, patient to contact team via:  Phone and Email  Future DSME appointment: - 3-4 months

## 2014-11-26 ENCOUNTER — Telehealth: Payer: Self-pay | Admitting: *Deleted

## 2014-11-26 NOTE — Telephone Encounter (Signed)
Unable to reach patient about a1c

## 2015-01-19 DIAGNOSIS — E11621 Type 2 diabetes mellitus with foot ulcer: Secondary | ICD-10-CM | POA: Insufficient documentation

## 2015-01-19 DIAGNOSIS — M2040 Other hammer toe(s) (acquired), unspecified foot: Secondary | ICD-10-CM | POA: Insufficient documentation

## 2015-01-19 DIAGNOSIS — L97502 Non-pressure chronic ulcer of other part of unspecified foot with fat layer exposed: Secondary | ICD-10-CM | POA: Insufficient documentation

## 2015-01-19 DIAGNOSIS — E1140 Type 2 diabetes mellitus with diabetic neuropathy, unspecified: Secondary | ICD-10-CM | POA: Insufficient documentation

## 2015-01-19 DIAGNOSIS — E1142 Type 2 diabetes mellitus with diabetic polyneuropathy: Secondary | ICD-10-CM | POA: Insufficient documentation

## 2015-01-19 DIAGNOSIS — IMO0002 Reserved for concepts with insufficient information to code with codable children: Secondary | ICD-10-CM | POA: Insufficient documentation

## 2015-01-21 ENCOUNTER — Encounter: Payer: BLUE CROSS/BLUE SHIELD | Attending: Internal Medicine | Admitting: *Deleted

## 2015-01-21 VITALS — Ht 74.0 in | Wt 179.9 lb

## 2015-01-21 DIAGNOSIS — E1059 Type 1 diabetes mellitus with other circulatory complications: Secondary | ICD-10-CM | POA: Insufficient documentation

## 2015-01-21 DIAGNOSIS — I70209 Unspecified atherosclerosis of native arteries of extremities, unspecified extremity: Secondary | ICD-10-CM | POA: Diagnosis not present

## 2015-01-21 DIAGNOSIS — E1051 Type 1 diabetes mellitus with diabetic peripheral angiopathy without gangrene: Secondary | ICD-10-CM

## 2015-01-21 DIAGNOSIS — Z713 Dietary counseling and surveillance: Secondary | ICD-10-CM | POA: Diagnosis not present

## 2015-01-25 ENCOUNTER — Encounter: Payer: Self-pay | Admitting: *Deleted

## 2015-01-25 NOTE — Patient Instructions (Signed)
Plan:  Aim for 4 Carb Choices per meal (60 grams) +/- 1 either way  Aim for 0-2 Carbs per snack if hungry  Include protein in moderation with your meals and snacks Continue reading food labels for Total Carbohydrate and Fat Grams of foods Continue with your activity level daily as tolerated Continue checking BG y as directed by MD  Continue taking medication Lantus and Novolin 70/30 insulin as directed by MD Consider attending the DM 1 Support Group that meets the 1st Thursday of every month.

## 2015-01-25 NOTE — Progress Notes (Signed)
Diabetes Self-Management Education  Visit Type:  Follow-up  Appt. Start Time: 0830 Appt. End Time: 0900  01/25/2015  [name], identified by name and date of birth, is a 64 y.o. male with a diagnosis of Diabetes:  .   ASSESSMENT  Height 6\' 2"  (1.88 m), weight 179 lb 14.4 oz (81.602 kg). Body mass index is 23.09 kg/(m^2).       Diabetes Self-Management Education - 01/25/15 1201    Psychosocial Assessment   Patient Belief/Attitude about Diabetes Other (comment)  no change   Patient Concerns Nutrition/Meal planning;Glycemic Control   Learning Readiness Contemplating   Complications   Last HgB A1C per patient/outside source 9 %   How often do you check your blood sugar? 3-4 times/day   Number of hypoglycemic episodes per month 10   Are you checking your feet? Yes   Exercise   Exercise Type Light (walking / raking leaves)  mows his own yard   Patient Education   Previous Diabetes Education Yes (please comment)  [place]   Acute complications Taught treatment of hypoglycemia - the 15 rule.  reviewed causes, treatment and prevention options for hypoglycemia   Psychosocial adjustment Identified and addressed patients feelings and concerns about diabetes;Brainstormed with patient on coping mechanisms for social situations, getting support from significant others, dealing with feelings about diabetes  encouraged him to attend DM 1 Support Group   Patient Self-Evaluation of Goals - Patient rates self as meeting previously set goals (% of time)   Nutrition 50 - 75 %   Physical Activity 50 - 75 %   Medications >75%   Monitoring 25 - 50%   Problem Solving 50 - 75 %   Reducing Risk 25 - 50%   Subsequent Visit   Since your last visit have you continued or begun to take your medications as prescribed? Yes  doing better   Since your last visit, are you checking your blood glucose at least once a day? Yes      Learning Objective:  Patient will have a greater understanding of  diabetes self-management. Patient education plan is to attend individual and/or group sessions per assessed needs and concerns.   Plan:   Patient Instructions  Plan:  Aim for 4 Carb Choices per meal (60 grams) +/- 1 either way  Aim for 0-2 Carbs per snack if hungry  Include protein in moderation with your meals and snacks Continue reading food labels for Total Carbohydrate and Fat Grams of foods Continue with your activity level daily as tolerated Continue checking BG y as directed by MD  Continue taking medication Lantus and Novolin 70/30 insulin as directed by MD Consider attending the DM 1 Support Group that meets the 1st Thursday of every month.       Expected Outcomes:  Demonstrated limited interest in learning.  Expect minimal changes  Education material provided: Support group flyer, Hypoglycemia guidelines  If problems or questions, patient to contact team via:  Phone and Email  Future DSME appointment: - PRN (Patient to call for any further appointments)

## 2015-01-27 ENCOUNTER — Telehealth: Payer: Self-pay | Admitting: Internal Medicine

## 2015-01-27 ENCOUNTER — Encounter: Payer: Self-pay | Admitting: Internal Medicine

## 2015-01-27 ENCOUNTER — Ambulatory Visit (INDEPENDENT_AMBULATORY_CARE_PROVIDER_SITE_OTHER): Payer: BLUE CROSS/BLUE SHIELD | Admitting: Internal Medicine

## 2015-01-27 ENCOUNTER — Other Ambulatory Visit (INDEPENDENT_AMBULATORY_CARE_PROVIDER_SITE_OTHER): Payer: BLUE CROSS/BLUE SHIELD

## 2015-01-27 VITALS — BP 138/72 | HR 60 | Temp 97.8°F | Resp 16 | Ht 74.0 in | Wt 178.0 lb

## 2015-01-27 DIAGNOSIS — E1059 Type 1 diabetes mellitus with other circulatory complications: Secondary | ICD-10-CM

## 2015-01-27 DIAGNOSIS — I70209 Unspecified atherosclerosis of native arteries of extremities, unspecified extremity: Secondary | ICD-10-CM

## 2015-01-27 DIAGNOSIS — E038 Other specified hypothyroidism: Secondary | ICD-10-CM | POA: Diagnosis not present

## 2015-01-27 DIAGNOSIS — E1051 Type 1 diabetes mellitus with diabetic peripheral angiopathy without gangrene: Secondary | ICD-10-CM

## 2015-01-27 LAB — CBC WITH DIFFERENTIAL/PLATELET
Basophils Absolute: 0.1 10*3/uL (ref 0.0–0.1)
Basophils Relative: 1.3 % (ref 0.0–3.0)
EOS ABS: 0.4 10*3/uL (ref 0.0–0.7)
EOS PCT: 5.5 % — AB (ref 0.0–5.0)
HCT: 43.2 % (ref 39.0–52.0)
HEMOGLOBIN: 14.8 g/dL (ref 13.0–17.0)
Lymphocytes Relative: 23.2 % (ref 12.0–46.0)
Lymphs Abs: 1.6 10*3/uL (ref 0.7–4.0)
MCHC: 34.3 g/dL (ref 30.0–36.0)
MCV: 92.3 fl (ref 78.0–100.0)
MONO ABS: 0.6 10*3/uL (ref 0.1–1.0)
Monocytes Relative: 8.3 % (ref 3.0–12.0)
Neutro Abs: 4.4 10*3/uL (ref 1.4–7.7)
Neutrophils Relative %: 61.7 % (ref 43.0–77.0)
Platelets: 223 10*3/uL (ref 150.0–400.0)
RBC: 4.68 Mil/uL (ref 4.22–5.81)
RDW: 13.7 % (ref 11.5–15.5)
WBC: 7.1 10*3/uL (ref 4.0–10.5)

## 2015-01-27 LAB — BASIC METABOLIC PANEL
BUN: 17 mg/dL (ref 6–23)
CO2: 32 mEq/L (ref 19–32)
Calcium: 9.5 mg/dL (ref 8.4–10.5)
Chloride: 102 mEq/L (ref 96–112)
Creatinine, Ser: 1.19 mg/dL (ref 0.40–1.50)
GFR: 65.42 mL/min (ref >60.00)
Glucose, Bld: 155 mg/dL — ABNORMAL HIGH (ref 70–99)
Potassium: 4.2 mEq/L (ref 3.5–5.1)
Sodium: 140 mEq/L (ref 135–145)

## 2015-01-27 LAB — TSH: TSH: 0.82 u[IU]/mL (ref 0.35–4.50)

## 2015-01-27 LAB — IBC PANEL
IRON: 153 ug/dL (ref 42–165)
SATURATION RATIOS: 59.4 % — AB (ref 20.0–50.0)
Transferrin: 184 mg/dL — ABNORMAL LOW (ref 212.0–360.0)

## 2015-01-27 LAB — HEMOGLOBIN A1C: Hgb A1c MFr Bld: 8.2 % — ABNORMAL HIGH (ref 4.6–6.5)

## 2015-01-27 LAB — FERRITIN: FERRITIN: 72.5 ng/mL (ref 22.0–322.0)

## 2015-01-27 NOTE — Progress Notes (Signed)
Pre visit review using our clinic review tool, if applicable. No additional management support is needed unless otherwise documented below in the visit note. 

## 2015-01-27 NOTE — Progress Notes (Signed)
Subjective:  Patient ID: Logan [name], male    DOB: 18-Oct-1950  Age: 64 y.o. MRN: [id number]  CC: Hypothyroidism; Diabetes; and Hypertension   HPI Logan [name] presents for follow-up on his blood sugar. Since I last saw him he has been seeing an orthopedic surgeon and a wound care doctor because she has developed an ulcer on his left great toe. The wound care doctor is in [facility]. He tells me that he recently had ABIs done of his lower extremities but he doesn't know the results yet. He denies claudication.  Outpatient Prescriptions Prior to Visit  Medication Sig Dispense Refill  . amLODipine (NORVASC) 10 MG tablet Take 1 tablet (10 mg total) by mouth daily. 90 tablet 3  . aspirin 81 MG tablet Take 81 mg by mouth 2 (two) times daily.     . cloNIDine (CATAPRES) 0.1 MG tablet Take 1 tablet (0.1 mg total) by mouth daily as needed. If BP is over 180 60 tablet 11  . insulin glargine (LANTUS) 100 UNIT/ML injection Inject 0.12 mLs (12 Units total) into the skin at bedtime. 10 mL 11  . insulin NPH-regular (NOVOLIN 70/30) (70-30) 100 UNIT/ML injection Inject 15-17 Units into the skin 2 (two) times daily with a meal. Total of 32 units per day    . levothyroxine (SYNTHROID) 100 MCG tablet Take 1 tablet (100 mcg total) by mouth daily before breakfast. 90 tablet 1  . losartan (COZAAR) 100 MG tablet Take 1 tablet (100 mg total) by mouth daily. 90 tablet 3  . metoprolol (LOPRESSOR) 50 MG tablet TAKE ONE & ONE-HALF TABLETS BY MOUTH ONCE DAILY 135 tablet 3  . simvastatin (ZOCOR) 20 MG tablet Take 1 tablet (20 mg total) by mouth daily. 90 tablet 3   No facility-administered medications prior to visit.    ROS Review of Systems  Constitutional: Negative.  Negative for fever, chills, diaphoresis, appetite change and fatigue.  HENT: Negative.   Eyes: Negative.   Respiratory: Negative.  Negative for cough, choking, chest tightness, shortness of breath and stridor.   Cardiovascular: Negative.   Negative for chest pain, palpitations and leg swelling.  Gastrointestinal: Negative.  Negative for nausea, vomiting, abdominal pain, diarrhea, constipation and blood in stool.  Endocrine: Negative.  Negative for polydipsia, polyphagia and polyuria.  Genitourinary: Negative.   Musculoskeletal: Negative.  Negative for myalgias, back pain, arthralgias and neck pain.  Skin: Negative.   Allergic/Immunologic: Negative.   Neurological: Negative.  Negative for dizziness.  Hematological: Negative.  Negative for adenopathy. Does not bruise/bleed easily.  Psychiatric/Behavioral: Negative.     Objective:  BP 138/72 mmHg  Pulse 60  Temp(Src) 97.8 F (36.6 C) (Oral)  Resp 16  Ht 6\' 2"  (1.88 m)  Wt 178 lb (80.74 kg)  BMI 22.84 kg/m2  SpO2 95%  BP Readings from Last 3 Encounters:  01/27/15 138/72  10/16/14 146/77  10/07/14 140/70    Wt Readings from Last 3 Encounters:  01/27/15 178 lb (80.74 kg)  01/21/15 179 lb 14.4 oz (81.602 kg)  10/22/14 182 lb 3.2 oz (82.645 kg)    Physical Exam  Lab Results  Component Value Date   WBC 7.1 01/27/2015   HGB 14.8 01/27/2015   HCT 43.2 01/27/2015   PLT 223.0 01/27/2015   GLUCOSE 155* 01/27/2015   CHOL 142 08/20/2014   TRIG 109.0 08/20/2014   HDL 41.20 08/20/2014   LDLCALC 79 08/20/2014   ALT 23 10/07/2014   AST 20 10/07/2014   NA 140 01/27/2015  K 4.2 01/27/2015   CL 102 01/27/2015   CREATININE 1.19 01/27/2015   BUN 17 01/27/2015   CO2 32 01/27/2015   TSH 0.82 01/27/2015   PSA 1.61 08/20/2014   HGBA1C 8.2* 01/27/2015   MICROALBUR 13.9* 08/20/2014    Ct Temporal Bones W/o Cm  07/18/2014   CLINICAL DATA:  BILATERAL chronic otitis media with severe hearing loss.  EXAM: CT TEMPORAL BONES WITHOUT CONTRAST  TECHNIQUE: Axial and coronal plane CT imaging of the petrous temporal bones was performed with thin-collimation image reconstruction. No intravenous contrast was administered. Multiplanar CT image reconstructions were also generated.   COMPARISON:  None.  FINDINGS: The RIGHT tympanic membrane is silhouetted by fluid and/or soft tissue in the RIGHT middle ear and fluid/soft tissue in the bony external canal, lateral to the ear drum. The osseous margins of the bony external canal appears slightly irregular, although the scutum is intact.  There is marked disruption of the ossicular chain on the RIGHT. No recognizable malleo-incudal joint. Probable displacement of the stapes away from the oval window. There is soft tissue in the attic and thinning of the tegmen tympani. There is widening of the aditus ad antrum, coalescence of the mastoid air cells, and thinning or dehiscence of the tegmen mastoideum, all consistent with chronic and widespread cholesteatoma. RIGHT inner ear structures intact without take down of the bony margins of the semicircular canals.  On the LEFT, there is similar but less severe disease. The external canal is unremarkable. LEFT TM is retracted. Anatomic ossicular anatomy is observed without erosion or displacement of the malleus, incus, or stapes. Within the LEFT middle ear cavity, there is soft tissue/fluid inferiorly, but the attic remains aerated with normal-appearing tegmen tympani. Slight widening of the LEFT aditus ad antrum and coalescence of LEFT mastoid air cells but intact tegmen mastoideum. Normal LEFT inner ear structures.  Unremarkable appearing noncontrast intracranial compartment. Normal visualized TMJs and extracranial soft tissues. No sinus air-fluid level is evident.  IMPRESSION: Severe BILATERAL chronic otitis media with BILATERAL middle ear and mastoid cholesteatomas, RIGHT much worse than LEFT. 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 Similar/less severe changes on the LEFT.  Dehiscence of the tegmen tympani and tegmen mastoideum on the RIGHT is suspected. Within limits of visualization on noncontrast exam, no visible intracranial  complication.   Electronically Signed   By: Rolla Flatten M.D.   On: 07/18/2014 15:34    Assessment & Plan:   [name] was seen today for hypothyroidism, diabetes and hypertension.  Diagnoses and all orders for this visit:  Other specified hypothyroidism- his TSH is in the normal range so I will keep him on the same dose of levothyroxine. -     TSH; Future  Hemochromatosis, hereditary- CBC is normal and he does not appear to be overloaded on iron. 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 I have asked him to increase his dose of NPH insulin. -     Basic metabolic panel; Future -     Hemoglobin A1c; Future  I am having Mr. Dyk maintain his aspirin, cloNIDine, insulin NPH-regular Human, amLODipine, insulin glargine, losartan, metoprolol, simvastatin, levothyroxine, and cephALEXin.  Meds ordered this encounter  Medications  . cephALEXin (KEFLEX) 500 MG  capsule    Sig:     Refill:  0     Follow-up: Return in about 4 months (around 05/29/2015).  Scarlette Calico, MD

## 2015-01-27 NOTE — Telephone Encounter (Signed)
Melissa from [facility] called to check on a form she faxed for patient's diabetic shoes. I did request she fax it back Her number is [phone]

## 2015-01-27 NOTE — Telephone Encounter (Signed)
LMOVM looked through my faxed paperwork which dates back to 8/10 when I started with Dr. Ronnald Ramp and I have nothing for that pt. Requested she fax it again.

## 2015-01-27 NOTE — Patient Instructions (Signed)
Type 1 Diabetes Mellitus Type 1 diabetes mellitus, often simply referred to as diabetes, is a long-term (chronic) disease. It occurs when the islet cells in the pancreas that make insulin (a hormone) are destroyed and can no longer make insulin. Insulin is needed to move sugars from food into the tissue cells. The tissue cells use the sugars for energy. In people with type 1 diabetes, the sugars build up in the blood instead of going into the tissue cells. As a result, high blood sugar (hyperglycemia) develops. Without insulin, the body breaks down fat cells for the needed energy. This breakdown of fat cells produces acid chemicals (ketones), which increases the acid levels in the body. The effect of either high ketone or high sugar (glucose) levels can be life-threatening.  Type 1 diabetes was also previously called juvenile diabetes. It most often occurs before the age of 30, but it can occur at any age. RISK FACTORS A person is predisposed to developing type 1 diabetes if someone in his or her family has the disease and is exposed to certain additional environmental triggers.  SYMPTOMS  Symptoms of type 1 diabetes may develop gradually over days to weeks or suddenly. The symptoms occur due to hyperglycemia. The symptoms can include:   Increased thirst (polydipsia).  Increased urination (polyuria).  Increased urination during the night (nocturia).  Weight loss. This weight loss may be rapid.  Frequent, recurring infections.  Tiredness (fatigue).  Weakness.  Vision changes, such as blurred vision.  Fruity smell to your breath.  Abdominal pain.  Nausea or vomiting. DIAGNOSIS  Type 1 diabetes is diagnosed when symptoms of diabetes are present and when blood glucose levels are increased. Your blood glucose level may be checked by one or more of the following blood tests:  A fasting blood glucose test. You will not be allowed to eat for at least 8 hours before a blood sample is  taken.  A random blood glucose test. Your blood glucose is checked at any time of the day regardless of when you ate.  A hemoglobin A1c blood glucose test. A hemoglobin A1c test provides information about blood glucose control over the previous 3 months. TREATMENT  Although type 1 diabetes cannot be prevented, it can be managed with insulin, diet, and exercise.  You will need to take insulin daily to keep blood glucose in the desired range.  You will need to match insulin dosing with exercise and healthy food choices. The treatment goal is to maintain the before-meal blood sugar (preprandial glucose) level at 70-130 mg/dL.  HOME CARE INSTRUCTIONS   Have your hemoglobin A1c level checked twice a year.  Perform daily blood glucose monitoring as directed by your health care provider.  Monitor urine ketones when you are ill and as directed by your health care provider.  Take your insulin as directed by your health care provider to maintain your blood glucose level in the desired range.  Never run out of insulin. It is needed every day.  Adjust insulin based on your intake of carbohydrates. Carbohydrates can raise blood glucose levels but need to be included in your diet. Carbohydrates provide vitamins, minerals, and fiber, which are an essential part of a healthy diet. Carbohydrates are found in fruits, vegetables, whole grains, dairy products, legumes, and foods containing added sugars.  Eat healthy foods. Alternate 3 meals with 3 snacks.  Maintain a healthy weight.  Carry a medical alert card or wear your medical alert jewelry.  Carry a 15-gram carbohydrate snack   with you at all times to treat low blood glucose (hypoglycemia). Some examples of 15-gram carbohydrate snacks include:  Glucose tablets, 3 or 4.  Glucose gel, 15-gram tube.  Raisins, 2 tablespoons (24 grams).  Jelly beans, 6.  Animal crackers, 8.  Fruit juice, regular soda, or low-fat milk, 4 ounces (120  mL).  Gummy treats, 9.  Recognize hypoglycemia. Hypoglycemia occurs with blood glucose levels of 70 mg/dL and below. The risk for hypoglycemia increases when fasting or skipping meals, during or after intense exercise, and during sleep. Hypoglycemia symptoms can include:  Tremors or shakes.  Decreased ability to concentrate.  Sweating.  Increased heart rate.  Headache.  Dry mouth.  Hunger.  Irritability.  Anxiety.  Restless sleep.  Altered speech or coordination.  Confusion.  Treat hypoglycemia promptly. If you are alert and able to safely swallow, follow the 15:15 rule:  Take 15-20 grams of rapid-acting glucose or carbohydrate. Rapid-acting options include glucose gel, glucose tablets, or 4 ounces (120 mL) of fruit juice, regular soda, or low-fat milk.  Check your blood glucose level 15 minutes after taking the glucose.  Take 15-20 grams more of glucose if the repeat blood glucose level is still 70 mg/dL or below.  Eat a meal or snack within 1 hour once blood glucose levels return to normal.  Be alert to polyuria and polydipsia, which are early signs of hyperglycemia. An early awareness of hyperglycemia allows for prompt treatment. Treat hyperglycemia as directed by your health care provider.  Exercise regularly as directed by your health care provider. This includes:  Performing resistance training twice a week such as push-ups, sit-ups, lifting weights, or using resistance bands.  Performing 150 minutes of cardio exercises each week such as walking, running, or playing sports.  Staying active and spending no more than 90 minutes at one time being inactive.  Adjust your insulin dosing and food intake as needed if you start a new exercise or sport.  Follow your sick-day plan at any time you are unable to eat or drink as usual.   Do not use any tobacco products including cigarettes, chewing tobacco, or electronic cigarettes. If you need help quitting, ask your  health care provider.  Limit alcohol intake to no more than 1 drink per day for nonpregnant women and 2 drinks per day for men. You should drink alcohol only when you are also eating food. Talk with your health care provider about whether alcohol is safe for you. Tell your health care provider if you drink alcohol several times a week.  Keep all follow-up visits as directed by your health care provider.  Schedule an eye exam within 5 years of diagnosis and then annually.  Perform daily skin and foot care. Examine your skin and feet daily for cuts, bruises, redness, nail problems, bleeding, blisters, or sores. A foot exam by a health care provider should be done annually.  Brush your teeth and gums at least twice a day and floss at least once a day. Follow up with your dentist regularly.  Share your diabetes management plan with your workplace or school.  Stay up-to-date with immunizations. It is recommended that people with diabetes who are over 42 years old get the pneumonia vaccine. In some cases, two separate shots may be given. Ask your health care provider if your pneumonia vaccination is up-to-date.  Learn to manage stress.  Obtain ongoing diabetes education and support as needed.  Participate in or seek rehabilitation as needed to maintain or improve independence  and quality of life. Request a physical or occupational therapy referral if you are having foot or hand numbness, or difficulties with grooming, dressing, eating, or physical activity. SEEK MEDICAL CARE IF:   You are unable to eat food or drink fluids for more than 6 hours.  You have nausea and vomiting for more than 6 hours.  Your blood glucose level is over 240 mg/dL.  There is a change in mental status.  You develop an additional serious illness.  You have diarrhea for more than 6 hours.  You have been sick or have had a fever for a couple of days and are not getting better.  You have pain during any physical  activity. SEEK IMMEDIATE MEDICAL CARE IF:  You have difficulty breathing.  You have moderate to large ketone levels. MAKE SURE YOU:  Understand these instructions.  Will watch your condition.  Will get help right away if you are not doing well or get worse. Document Released: 05/20/2000 Document Revised: 10/07/2013 Document Reviewed: 12/20/2011 ExitCare Patient Information 2015 [employer]. This information is not intended to replace advice given to you by your health care provider. Make sure you discuss any questions you have with your health care provider.  

## 2015-02-04 ENCOUNTER — Other Ambulatory Visit: Payer: Self-pay | Admitting: Internal Medicine

## 2015-04-15 LAB — HM DIABETES EYE EXAM

## 2015-04-21 ENCOUNTER — Encounter: Payer: Self-pay | Admitting: *Deleted

## 2015-04-24 ENCOUNTER — Encounter: Payer: Self-pay | Admitting: Internal Medicine

## 2015-04-24 ENCOUNTER — Ambulatory Visit (INDEPENDENT_AMBULATORY_CARE_PROVIDER_SITE_OTHER): Payer: BLUE CROSS/BLUE SHIELD | Admitting: Internal Medicine

## 2015-04-24 VITALS — BP 138/72 | HR 49 | Ht 74.0 in | Wt 183.0 lb

## 2015-04-24 DIAGNOSIS — E038 Other specified hypothyroidism: Secondary | ICD-10-CM

## 2015-04-24 DIAGNOSIS — I10 Essential (primary) hypertension: Secondary | ICD-10-CM | POA: Diagnosis not present

## 2015-04-24 LAB — T3, FREE: T3, Free: 1.8 pg/mL — ABNORMAL LOW (ref 2.3–4.2)

## 2015-04-24 LAB — TSH: TSH: 56.306 u[IU]/mL — AB (ref 0.350–4.500)

## 2015-04-24 LAB — T4, FREE: FREE T4: 0.41 ng/dL — AB (ref 0.80–1.80)

## 2015-04-24 NOTE — Progress Notes (Addendum)
Cardiology Office Note   Date:  04/24/2015   ID:  Logan [name], DOB [birth year], MRN [id number]  PCP:  Scarlette Calico, MD  Cardiologist:   Dorris Carnes, MD   No chief complaint on file.     History of Present Illness: Logan [name] is a 64 y.o. male with a history of CAD. 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 He had nonobstructive disease at catheterization in 2004. He had a negative Myoview scan in 2009 with an ejection fraction of 49%. Pt also had syncope in past Known bradycardia.  Since I saw him last he denies CP  Breathing is OK  Hunts regularly No limitations when in fiedl  No dizziness  No syncope    Says his thryoid Rx expired  He has not refilled  He says feels good     Current Outpatient Prescriptions  Medication Sig Dispense Refill  . amLODipine (NORVASC) 10 MG tablet Take 1 tablet (10 mg total) by mouth daily. 90 tablet 3  . aspirin 81 MG tablet Take 81 mg by mouth 2 (two) times daily.     . cephALEXin (KEFLEX) 500 MG capsule   0  . cloNIDine (CATAPRES) 0.1 MG tablet Take 1 tablet (0.1 mg total) by mouth daily as needed. If BP is over 180 60 tablet 11  . insulin glargine (LANTUS) 100 UNIT/ML injection Inject 0.12 mLs (12 Units total) into the skin at bedtime. 10 mL 11  . insulin NPH-regular (NOVOLIN 70/30) (70-30) 100 UNIT/ML injection Inject 15-17 Units into the skin 2 (two) times daily with a meal. Total of 32 units per day    . losartan (COZAAR) 100 MG tablet Take 1 tablet (100 mg total) by mouth daily. 90 tablet 3  . metoprolol (LOPRESSOR) 50 MG tablet TAKE ONE & ONE-HALF TABLETS BY MOUTH ONCE DAILY 135 tablet 3  . simvastatin (ZOCOR) 20 MG tablet Take 1 tablet (20 mg total) by mouth daily. 90 tablet 3   No current facility-administered medications for this visit.    Allergies:   Review of patient's allergies indicates no active allergies.   Past Medical History  Diagnosis Date  . Hypertension   . Diabetes mellitus   . Hearing loss     . CAD (coronary artery disease)   . Hemochromatosis   . Hyperlipidemia   . Diabetic retinopathy     Past Surgical History  Procedure Laterality Date  . Inner ear surgery      right  . 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 He has never used smokeless tobacco. He reports that he drinks alcohol. He reports that he does not use illicit drugs.   Family History:  The patient's family history includes Alcohol abuse in his father; Asthma in his mother; Cancer in his father; Colon cancer in his maternal uncle; Emphysema in his mother; Heart failure in his mother; Stroke in his father. There is no history of Heart disease, Rectal cancer, or Stomach cancer.    ROS:  Please see the history of present illness. All other systems are reviewed and  Negative to the above problem except as noted.    PHYSICAL EXAM: VS:  BP 138/72 mmHg  Pulse 49  Ht 6\' 2"  (  1.88 m)  Wt 83.008 kg (183 lb)  BMI 23.49 kg/m2  GEN: Well nourished, well developed, in no acute distress HEENT: normal Neck: no JVD, carotid bruits, or masses Cardiac: RRR; no murmurs, rubs, or gallops,no edema  Respiratory:  clear to auscultation bilaterally, normal work of breathing GI: soft, nontender, nondistended, + BS  No hepatomegaly  MS: no deformity Moving all extremities   Skin: warm and dry, no rash Neuro:  Strength and sensation are intact Psych: euthymic mood, full affect   EKG:  EKG is ordered today.  Sinus bradycardia 49 bpm  IWMI   Lipid Panel    Component Value Date/Time   CHOL 142 08/20/2014 0935   TRIG 109.0 08/20/2014 0935   HDL 41.20 08/20/2014 0935   CHOLHDL 3 08/20/2014 0935   VLDL 21.8 08/20/2014 0935   LDLCALC 79 08/20/2014 0935      Wt Readings from Last 3 Encounters:  04/24/15 83.008  kg (183 lb)  01/27/15 80.74 kg (178 lb)  01/21/15 81.602 kg (179 lb 14.4 oz)      ASSESSMENT AND PLAN:  1.  CAD  No symtoms of angina   Very active  2.  Bradycardia  No symptoms of dizziness SOB  I could pul back on meds some but pt does not want any changes    3.   Thyroid  Off of synthroid  Not clear why  Will check thyroid panel.    4.  HL  Keep on statin   5  HTN  BP adequate     Signed, Dorris Carnes, MD  04/24/2015 9:09 AM    [facility] [address], [place], [place]  [address] Phone: [phone]; Fax: [phone]

## 2015-04-24 NOTE — Patient Instructions (Addendum)
Your physician recommends that you continue on your current medications as directed. Please refer to the Current Medication list given to you today.  Your physician recommends that you return for lab work in: today (TSH, Free T3, Free T4))  Your physician wants you to follow-up in: 1 year with Dr. Harrington Challenger.  You will receive a reminder letter in the mail two months in advance. If you don't receive a letter, please call our office to schedule the follow-up appointment.

## 2015-04-28 ENCOUNTER — Other Ambulatory Visit: Payer: Self-pay

## 2015-04-28 DIAGNOSIS — I10 Essential (primary) hypertension: Secondary | ICD-10-CM

## 2015-04-29 ENCOUNTER — Telehealth: Payer: Self-pay | Admitting: Internal Medicine

## 2015-04-29 DIAGNOSIS — R7989 Other specified abnormal findings of blood chemistry: Secondary | ICD-10-CM

## 2015-04-29 MED ORDER — LEVOTHYROXINE SODIUM 100 MCG PO TABS: 100.0000 ug | ORAL_TABLET | Freq: Every day | ORAL | Status: DC

## 2015-04-29 NOTE — Telephone Encounter (Signed)
Called patient about lab results. Per Dr. Harrington Challenger, patient had been on synthroid in August. Needs to go back on it 100 mcg daily. F/U TSH in 3 months. Patient verbalized understanding. Medications sent to patient's pharmacy. Patient coming in for lab work on 07/29/14.

## 2015-04-29 NOTE — Telephone Encounter (Signed)
New problem   Pt returning call concerning his labs.

## 2015-06-23 ENCOUNTER — Encounter: Payer: Self-pay | Admitting: Internal Medicine

## 2015-06-23 ENCOUNTER — Other Ambulatory Visit (INDEPENDENT_AMBULATORY_CARE_PROVIDER_SITE_OTHER): Payer: BLUE CROSS/BLUE SHIELD

## 2015-06-23 ENCOUNTER — Ambulatory Visit (INDEPENDENT_AMBULATORY_CARE_PROVIDER_SITE_OTHER): Payer: BLUE CROSS/BLUE SHIELD | Admitting: Internal Medicine

## 2015-06-23 VITALS — BP 148/80 | HR 56 | Temp 97.6°F | Resp 16 | Ht 74.0 in | Wt 179.0 lb

## 2015-06-23 DIAGNOSIS — I2510 Atherosclerotic heart disease of native coronary artery without angina pectoris: Secondary | ICD-10-CM | POA: Diagnosis not present

## 2015-06-23 DIAGNOSIS — E1065 Type 1 diabetes mellitus with hyperglycemia: Secondary | ICD-10-CM

## 2015-06-23 DIAGNOSIS — E7800 Pure hypercholesterolemia, unspecified: Secondary | ICD-10-CM

## 2015-06-23 DIAGNOSIS — I10 Essential (primary) hypertension: Secondary | ICD-10-CM

## 2015-06-23 DIAGNOSIS — I70209 Unspecified atherosclerosis of native arteries of extremities, unspecified extremity: Secondary | ICD-10-CM

## 2015-06-23 DIAGNOSIS — E1059 Type 1 diabetes mellitus with other circulatory complications: Secondary | ICD-10-CM

## 2015-06-23 DIAGNOSIS — I2583 Coronary atherosclerosis due to lipid rich plaque: Secondary | ICD-10-CM

## 2015-06-23 DIAGNOSIS — E103299 Type 1 diabetes mellitus with mild nonproliferative diabetic retinopathy without macular edema, unspecified eye: Secondary | ICD-10-CM | POA: Diagnosis not present

## 2015-06-23 DIAGNOSIS — IMO0002 Reserved for concepts with insufficient information to code with codable children: Secondary | ICD-10-CM

## 2015-06-23 DIAGNOSIS — E038 Other specified hypothyroidism: Secondary | ICD-10-CM

## 2015-06-23 DIAGNOSIS — E1051 Type 1 diabetes mellitus with diabetic peripheral angiopathy without gangrene: Secondary | ICD-10-CM

## 2015-06-23 DIAGNOSIS — R7989 Other specified abnormal findings of blood chemistry: Secondary | ICD-10-CM

## 2015-06-23 LAB — LIPID PANEL
Cholesterol: 141 mg/dL (ref 0–200)
HDL: 40.9 mg/dL (ref >39.00)
LDL Cholesterol: 84 mg/dL (ref 0–99)
NonHDL: 100.04
Total CHOL/HDL Ratio: 3
Triglycerides: 79 mg/dL (ref 0.0–149.0)
VLDL: 15.8 mg/dL (ref 0.0–40.0)

## 2015-06-23 LAB — CBC WITH DIFFERENTIAL/PLATELET
BASOS PCT: 0.6 % (ref 0.0–3.0)
Basophils Absolute: 0.1 10*3/uL (ref 0.0–0.1)
EOS PCT: 4.3 % (ref 0.0–5.0)
Eosinophils Absolute: 0.4 10*3/uL (ref 0.0–0.7)
HCT: 45 % (ref 39.0–52.0)
HEMOGLOBIN: 15.2 g/dL (ref 13.0–17.0)
LYMPHS ABS: 2.1 10*3/uL (ref 0.7–4.0)
Lymphocytes Relative: 25.3 % (ref 12.0–46.0)
MCHC: 33.8 g/dL (ref 30.0–36.0)
MCV: 93.1 fl (ref 78.0–100.0)
MONOS PCT: 9.4 % (ref 3.0–12.0)
Monocytes Absolute: 0.8 10*3/uL (ref 0.1–1.0)
Neutro Abs: 5 10*3/uL (ref 1.4–7.7)
Neutrophils Relative %: 60.4 % (ref 43.0–77.0)
Platelets: 219 10*3/uL (ref 150.0–400.0)
RBC: 4.84 Mil/uL (ref 4.22–5.81)
RDW: 14.2 % (ref 11.5–15.5)
WBC: 8.2 10*3/uL (ref 4.0–10.5)

## 2015-06-23 LAB — HEMOGLOBIN A1C: Hgb A1c MFr Bld: 8.9 % — ABNORMAL HIGH (ref 4.6–6.5)

## 2015-06-23 LAB — BASIC METABOLIC PANEL
BUN: 17 mg/dL (ref 6–23)
CALCIUM: 9.3 mg/dL (ref 8.4–10.5)
CO2: 31 meq/L (ref 19–32)
CREATININE: 0.99 mg/dL (ref 0.40–1.50)
Chloride: 103 mEq/L (ref 96–112)
GFR: 80.79 mL/min (ref >60.00)
GLUCOSE: 65 mg/dL — AB (ref 70–99)
Potassium: 3.7 mEq/L (ref 3.5–5.1)
Sodium: 142 mEq/L (ref 135–145)

## 2015-06-23 LAB — TSH
TSH: 1.861 u[IU]/mL (ref 0.350–4.500)
TSH: 1.92 u[IU]/mL (ref 0.35–4.50)

## 2015-06-23 MED ORDER — METOPROLOL TARTRATE 25 MG PO TABS: 12.5000 mg | ORAL_TABLET | Freq: Two times a day (BID) | ORAL | Status: DC

## 2015-06-23 NOTE — Progress Notes (Signed)
Subjective:  Patient ID: Logan [name], male    DOB: 1951/05/26  Age: 65 y.o. MRN: [id number]  CC: Hypertension; Hypothyroidism; and Diabetes   HPI Logan [name] presents for follow-up.  He complains of recurrent episodes of bradycardia with a pulse down to about 50. He recently saw his cardiologist and she thought it was because his hypothyroidism was undertreated. Today's TSH is normal but he continues to have episodes of bradycardia. Sometimes his blood pressure goes down as well but amazingly he does not experience any lightheadedness, dizziness, near syncope. He has remained on the same dose of metoprolol throughout this time.  Outpatient Prescriptions Prior to Visit  Medication Sig Dispense Refill  . amLODipine (NORVASC) 10 MG tablet Take 1 tablet (10 mg total) by mouth daily. 90 tablet 3  . aspirin 81 MG tablet Take 81 mg by mouth 2 (two) times daily.     . cloNIDine (CATAPRES) 0.1 MG tablet Take 1 tablet (0.1 mg total) by mouth daily as needed. If BP is over 180 60 tablet 11  . insulin glargine (LANTUS) 100 UNIT/ML injection Inject 0.12 mLs (12 Units total) into the skin at bedtime. 10 mL 11  . insulin NPH-regular (NOVOLIN 70/30) (70-30) 100 UNIT/ML injection Inject 15-17 Units into the skin 2 (two) times daily with a meal. Total of 32 units per day    . levothyroxine (SYNTHROID, LEVOTHROID) 100 MCG tablet Take 1 tablet (100 mcg total) by mouth daily before breakfast. 30 tablet 11  . losartan (COZAAR) 100 MG tablet Take 1 tablet (100 mg total) by mouth daily. 90 tablet 3  . simvastatin (ZOCOR) 20 MG tablet Take 1 tablet (20 mg total) by mouth daily. 90 tablet 3  . cephALEXin (KEFLEX) 500 MG capsule   0  . metoprolol (LOPRESSOR) 50 MG tablet TAKE ONE & ONE-HALF TABLETS BY MOUTH ONCE DAILY 135 tablet 3   No facility-administered medications prior to visit.    ROS Review of Systems  Constitutional: Negative.  Negative for fever, chills, diaphoresis, appetite change and fatigue.    HENT: Negative.   Eyes: Negative.  Negative for visual disturbance.  Respiratory: Negative.  Negative for cough, chest tightness, shortness of breath and stridor.   Cardiovascular: Negative.  Negative for chest pain, palpitations and leg swelling.  Gastrointestinal: Negative.  Negative for nausea, vomiting, abdominal pain, diarrhea, constipation and blood in stool.  Endocrine: Negative.  Negative for polydipsia, polyphagia and polyuria.  Genitourinary: Negative.  Negative for dysuria and hematuria.  Musculoskeletal: Negative.  Negative for myalgias, back pain, arthralgias and neck pain.  Skin: Negative.  Negative for color change and rash.  Allergic/Immunologic: Negative.   Neurological: Negative.  Negative for dizziness, syncope, weakness and light-headedness.  Hematological: Negative.  Negative for adenopathy. Does not bruise/bleed easily.  Psychiatric/Behavioral: Negative.     Objective:  BP 148/80 mmHg  Pulse 56  Temp(Src) 97.6 F (36.4 C) (Oral)  Ht 6\' 2"  (1.88 m)  Wt 179 lb (81.194 kg)  BMI 22.97 kg/m2  SpO2 99%  BP Readings from Last 3 Encounters:  06/23/15 148/80  04/24/15 138/72  01/27/15 138/72    Wt Readings from Last 3 Encounters:  06/23/15 179 lb (81.194 kg)  04/24/15 183 lb (83.008 kg)  01/27/15 178 lb (80.74 kg)    Physical Exam  Constitutional: He is oriented to person, place, and time. He appears well-developed and well-nourished. No distress.  HENT:  Head: Normocephalic and atraumatic.  Mouth/Throat: Oropharynx is clear and moist. No oropharyngeal exudate.  Eyes: Conjunctivae are normal. Right eye exhibits no discharge. Left eye exhibits no discharge. No scleral icterus.  Neck: Normal range of motion. Neck supple. No JVD present. No tracheal deviation present. No thyromegaly present.  Cardiovascular: Regular rhythm, normal heart sounds and intact distal pulses.  Bradycardia present.  Exam reveals no gallop and no friction rub.   No murmur  heard. Pulmonary/Chest: Effort normal and breath sounds normal. No stridor. No respiratory distress. He has no wheezes. He has no rales. He exhibits no tenderness.  Abdominal: Soft. Bowel sounds are normal. He exhibits no distension and no mass. There is no tenderness. There is no rebound and no guarding.  Musculoskeletal: Normal range of motion. He exhibits no edema or tenderness.  Lymphadenopathy:    He has no cervical adenopathy.  Neurological: He is oriented to person, place, and time.  Skin: Skin is warm and dry. No rash noted. He is not diaphoretic. No erythema. No pallor.  Vitals reviewed.   Lab Results  Component Value Date   WBC 8.2 06/23/2015   HGB 15.2 06/23/2015   HCT 45.0 06/23/2015   PLT 219.0 06/23/2015   GLUCOSE 65* 06/23/2015   CHOL 141 06/23/2015   TRIG 79.0 06/23/2015   HDL 40.90 06/23/2015   LDLCALC 84 06/23/2015   ALT 23 10/07/2014   AST 20 10/07/2014   NA 142 06/23/2015   K 3.7 06/23/2015   CL 103 06/23/2015   CREATININE 0.99 06/23/2015   BUN 17 06/23/2015   CO2 31 06/23/2015   TSH 1.861 06/23/2015   TSH 1.92 06/23/2015   PSA 1.61 08/20/2014   HGBA1C 8.9* 06/23/2015   MICROALBUR 13.9* 08/20/2014    Ct Temporal Bones W/o Cm  07/18/2014  CLINICAL DATA:  BILATERAL chronic otitis media with severe hearing loss. EXAM: CT TEMPORAL BONES WITHOUT CONTRAST TECHNIQUE: Axial and coronal plane CT imaging of the petrous temporal bones was performed with thin-collimation image reconstruction. No intravenous contrast was administered. Multiplanar CT image reconstructions were also generated. COMPARISON:  None. FINDINGS: The RIGHT tympanic membrane is silhouetted by fluid and/or soft tissue in the RIGHT middle ear and fluid/soft tissue in the bony external canal, lateral to the ear drum. The osseous margins of the bony external canal appears slightly irregular, although the scutum is intact. There is marked disruption of the ossicular chain on the RIGHT. No recognizable  malleo-incudal joint. Probable displacement of the stapes away from the oval window. There is soft tissue in the attic and thinning of the tegmen tympani. There is widening of the aditus ad antrum, coalescence of the mastoid air cells, and thinning or dehiscence of the tegmen mastoideum, all consistent with chronic and widespread cholesteatoma. RIGHT inner ear structures intact without take down of the bony margins of the semicircular canals. On the LEFT, there is similar but less severe disease. The external canal is unremarkable. LEFT TM is retracted. Anatomic ossicular anatomy is observed without erosion or displacement of the malleus, incus, or stapes. Within the LEFT middle ear cavity, there is soft tissue/fluid inferiorly, but the attic remains aerated with normal-appearing tegmen tympani. Slight widening of the LEFT aditus ad antrum and coalescence of LEFT mastoid air cells but intact tegmen mastoideum. Normal LEFT inner ear structures. Unremarkable appearing noncontrast intracranial compartment. Normal visualized TMJs and extracranial soft tissues. No sinus air-fluid level is evident. IMPRESSION: Severe BILATERAL chronic otitis media with BILATERAL middle ear and mastoid cholesteatomas, RIGHT much worse than LEFT. 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 Similar/less severe changes on the LEFT. Dehiscence of the tegmen tympani and tegmen mastoideum on the RIGHT is suspected. Within limits of visualization on noncontrast exam, no visible intracranial complication. Electronically Signed   By: Rolla Flatten M.D.   On: 07/18/2014 15:34    Assessment & Plan:   Usher was seen today for hypertension, hypothyroidism and diabetes.  Diagnoses and all orders for this visit:  Essential hypertension -  His blood pressure is adequately well controlled but he continues to complain of bradycardia, I've asked him to decrease his Lopressor  dose. -     metoprolol tartrate (LOPRESSOR) 25 MG tablet; Take 0.5 tablets (12.5 mg total) by mouth 2 (two) times daily. -     CBC with Differential/Platelet; Future -     Basic metabolic panel; Future  Coronary atherosclerosis due to lipid rich plaque-  He has had no recent episodes of chest pain or shortness of breath, will continue to modify his risk factors with statin therapy to obtain good LDL control, blood pressure control and good blood sugar control. -     metoprolol tartrate (LOPRESSOR) 25 MG tablet; Take 0.5 tablets (12.5 mg total) by mouth 2 (two) times daily. 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 -     TSH; Future -     CBC with Differential/Platelet; Future  Pure hypercholesterolemia with target low density lipoprotein (LDL) less than 70-  He has almost achieved his LDL goal and is doing well on statin, -     Lipid panel; Future -     TSH; Future -     CBC with Differential/Platelet; Future  I have discontinued Mr. Cavagnaro's metoprolol and cephALEXin. I am also having him start on metoprolol tartrate. Additionally, I am having him maintain his aspirin, cloNIDine, insulin NPH-regular Human, amLODipine, insulin glargine, losartan, simvastatin, and levothyroxine.  Meds ordered this encounter    Medications  . metoprolol tartrate (LOPRESSOR) 25 MG tablet    Sig: Take 0.5 tablets (12.5 mg total) by mouth 2 (two) times daily.    Dispense:  90 tablet    Refill:  1     Follow-up: Return in about 3 months (around 09/21/2015).  Scarlette Calico, MD

## 2015-06-23 NOTE — Patient Instructions (Signed)

## 2015-06-23 NOTE — Progress Notes (Signed)
Pre visit review using our clinic review tool, if applicable. No additional management support is needed unless otherwise documented below in the visit note. 

## 2015-06-25 ENCOUNTER — Other Ambulatory Visit: Payer: Self-pay | Admitting: Internal Medicine

## 2015-06-25 DIAGNOSIS — E038 Other specified hypothyroidism: Secondary | ICD-10-CM

## 2015-06-25 DIAGNOSIS — E1065 Type 1 diabetes mellitus with hyperglycemia: Secondary | ICD-10-CM

## 2015-06-25 DIAGNOSIS — I10 Essential (primary) hypertension: Secondary | ICD-10-CM

## 2015-06-25 DIAGNOSIS — R7989 Other specified abnormal findings of blood chemistry: Secondary | ICD-10-CM

## 2015-06-25 DIAGNOSIS — I2510 Atherosclerotic heart disease of native coronary artery without angina pectoris: Secondary | ICD-10-CM

## 2015-06-25 DIAGNOSIS — E103399 Type 1 diabetes mellitus with moderate nonproliferative diabetic retinopathy without macular edema, unspecified eye: Secondary | ICD-10-CM

## 2015-06-25 DIAGNOSIS — E103299 Type 1 diabetes mellitus with mild nonproliferative diabetic retinopathy without macular edema, unspecified eye: Secondary | ICD-10-CM

## 2015-06-25 DIAGNOSIS — E7800 Pure hypercholesterolemia, unspecified: Secondary | ICD-10-CM

## 2015-06-25 DIAGNOSIS — IMO0002 Reserved for concepts with insufficient information to code with codable children: Secondary | ICD-10-CM

## 2015-06-25 DIAGNOSIS — I70209 Unspecified atherosclerosis of native arteries of extremities, unspecified extremity: Secondary | ICD-10-CM

## 2015-06-25 DIAGNOSIS — E1051 Type 1 diabetes mellitus with diabetic peripheral angiopathy without gangrene: Secondary | ICD-10-CM

## 2015-06-25 DIAGNOSIS — I2583 Coronary atherosclerosis due to lipid rich plaque: Principal | ICD-10-CM

## 2015-06-25 MED ORDER — AMLODIPINE BESYLATE 10 MG PO TABS: 10.0000 mg | ORAL_TABLET | Freq: Every day | ORAL | Status: DC

## 2015-06-25 MED ORDER — SIMVASTATIN 20 MG PO TABS: 20.0000 mg | ORAL_TABLET | Freq: Every day | ORAL | Status: DC

## 2015-06-25 MED ORDER — LEVOTHYROXINE SODIUM 100 MCG PO TABS: 100.0000 ug | ORAL_TABLET | Freq: Every day | ORAL | Status: DC

## 2015-06-25 MED ORDER — INSULIN GLARGINE 100 UNIT/ML ~~LOC~~ SOLN: 12.0000 [IU] | Freq: Every day | SUBCUTANEOUS | Status: DC

## 2015-06-25 MED ORDER — LOSARTAN POTASSIUM 100 MG PO TABS: 100.0000 mg | ORAL_TABLET | Freq: Every day | ORAL | Status: DC

## 2015-07-30 ENCOUNTER — Other Ambulatory Visit (INDEPENDENT_AMBULATORY_CARE_PROVIDER_SITE_OTHER): Payer: BLUE CROSS/BLUE SHIELD | Admitting: *Deleted

## 2015-07-30 DIAGNOSIS — R946 Abnormal results of thyroid function studies: Secondary | ICD-10-CM

## 2015-07-30 DIAGNOSIS — R7989 Other specified abnormal findings of blood chemistry: Secondary | ICD-10-CM

## 2015-07-30 LAB — TSH: TSH: 0.89 m[IU]/L (ref 0.40–4.50)

## 2015-07-30 NOTE — Addendum Note (Signed)
Addended by: Eulis Foster on: 07/30/2015 11:57 AM   Modules accepted: Orders

## 2015-07-30 NOTE — Addendum Note (Signed)
Addended by: Eulis Foster on: 07/30/2015 11:59 AM   Modules accepted: Orders

## 2015-08-17 ENCOUNTER — Ambulatory Visit (INDEPENDENT_AMBULATORY_CARE_PROVIDER_SITE_OTHER): Payer: BLUE CROSS/BLUE SHIELD | Admitting: Family Medicine

## 2015-08-17 ENCOUNTER — Encounter: Payer: Self-pay | Admitting: Family Medicine

## 2015-08-17 VITALS — BP 160/80 | Temp 97.8°F | Wt 178.0 lb

## 2015-08-17 DIAGNOSIS — R030 Elevated blood-pressure reading, without diagnosis of hypertension: Secondary | ICD-10-CM

## 2015-08-17 DIAGNOSIS — IMO0001 Reserved for inherently not codable concepts without codable children: Secondary | ICD-10-CM

## 2015-08-17 DIAGNOSIS — J069 Acute upper respiratory infection, unspecified: Secondary | ICD-10-CM | POA: Diagnosis not present

## 2015-08-17 NOTE — Patient Instructions (Addendum)
Please rest, increase fluid, and you may use Mucinex DM for symptoms. Please return to clnic if symptoms do not improve in 3-4 days, worsen, or develop a fever >101, for further evaluation.  Upper Respiratory Infection, Adult Most upper respiratory infections (URIs) are a viral infection of the air passages leading to the lungs. A URI affects the nose, throat, and upper air passages. The most common type of URI is nasopharyngitis and is typically referred to as "the common cold." URIs run their course and usually go away on their own. Most of the time, a URI does not require medical attention, but sometimes a bacterial infection in the upper airways can follow a viral infection. This is called a secondary infection. Sinus and middle ear infections are common types of secondary upper respiratory infections. Bacterial pneumonia can also complicate a URI. A URI can worsen asthma and chronic obstructive pulmonary disease (COPD). Sometimes, these complications can require emergency medical care and may be life threatening.  CAUSES Almost all URIs are caused by viruses. A virus is a type of germ and can spread from one person to another.  RISKS FACTORS You may be at risk for a URI if:   You smoke.   You have chronic heart or lung disease.  You have a weakened defense (immune) system.   You are very young or very old.   You have nasal allergies or asthma.  You work in crowded or poorly ventilated areas.  You work in health care facilities or schools. SIGNS AND SYMPTOMS  Symptoms typically develop 2-3 days after you come in contact with a cold virus. Most viral URIs last 7-10 days. However, viral URIs from the influenza virus (flu virus) can last 14-18 days and are typically more severe. Symptoms may include:   Runny or stuffy (congested) nose.   Sneezing.   Cough.   Sore throat.   Headache.   Fatigue.   Fever.   Loss of appetite.   Pain in your forehead, behind your  eyes, and over your cheekbones (sinus pain).  Muscle aches.  DIAGNOSIS  Your health care provider may diagnose a URI by:  Physical exam.  Tests to check that your symptoms are not due to another condition such as:  Strep throat.  Sinusitis.  Pneumonia.  Asthma. TREATMENT  A URI goes away on its own with time. It cannot be cured with medicines, but medicines may be prescribed or recommended to relieve symptoms. Medicines may help:  Reduce your fever.  Reduce your cough.  Relieve nasal congestion. HOME CARE INSTRUCTIONS   Take medicines only as directed by your health care provider.   Gargle warm saltwater or take cough drops to comfort your throat as directed by your health care provider.  Use a warm mist humidifier or inhale steam from a shower to increase air moisture. This may make it easier to breathe.  Drink enough fluid to keep your urine clear or pale yellow.   Eat soups and other clear broths and maintain good nutrition.   Rest as needed.   Return to work when your temperature has returned to normal or as your health care provider advises. You may need to stay home longer to avoid infecting others. You can also use a face mask and careful hand washing to prevent spread of the virus.  Increase the usage of your inhaler if you have asthma.   Do not use any tobacco products, including cigarettes, chewing tobacco, or electronic cigarettes. If you need help  quitting, ask your health care provider. PREVENTION  The best way to protect yourself from getting a cold is to practice good hygiene.   Avoid oral or hand contact with people with cold symptoms.   Wash your hands often if contact occurs.  There is no clear evidence that vitamin C, vitamin E, echinacea, or exercise reduces the chance of developing a cold. However, it is always recommended to get plenty of rest, exercise, and practice good nutrition.  SEEK MEDICAL CARE IF:   You are getting worse  rather than better.   Your symptoms are not controlled by medicine.   You have chills.  You have worsening shortness of breath.  You have brown or red mucus.  You have yellow or brown nasal discharge.  You have pain in your face, especially when you bend forward.  You have a fever.  You have swollen neck glands.  You have pain while swallowing.  You have white areas in the back of your throat. SEEK IMMEDIATE MEDICAL CARE IF:   You have severe or persistent:  Headache.  Ear pain.  Sinus pain.  Chest pain.  You have chronic lung disease and any of the following:  Wheezing.  Prolonged cough.  Coughing up blood.  A change in your usual mucus.  You have a stiff neck.  You have changes in your:  Vision.  Hearing.  Thinking.  Mood. MAKE SURE YOU:   Understand these instructions.  Will watch your condition.  Will get help right away if you are not doing well or get worse.   This information is not intended to replace advice given to you by your health care provider. Make sure you discuss any questions you have with your health care provider.   Document Released: 11/16/2000 Document Revised: 10/07/2014 Document Reviewed: 08/28/2013 Elsevier Interactive Patient Education [employer].

## 2015-08-17 NOTE — Progress Notes (Signed)
Subjective:    Patient ID: Logan [name], male    DOB: 1951/03/29, 65 y.o.   MRN: [id number]  HPI  Mr. Paolini is a 65 year old male who presents today with chills and sweats, and generalized body aches for 5 days.  Associated symptoms of sneezing, rhinitis with clear nasal drainage, nonproductive cough, sinus pressure, and decreased appetite. Denies N/V/D and hypoglycemia today. Pertinent history of eustachian tube dysfunction and tube placement in 04/2015 and Type 1 diabetes,  and HTN. No treatments at home at this time.    Review of Systems  Constitutional: Positive for chills. Negative for fever and fatigue.  HENT: Positive for congestion, postnasal drip, rhinorrhea, sinus pressure and sneezing. Negative for sore throat.   Respiratory: Positive for cough. Negative for chest tightness and shortness of breath.   Cardiovascular: Negative for chest pain, palpitations and leg swelling.  Gastrointestinal: Negative for nausea, vomiting, abdominal pain, diarrhea and constipation.  Endocrine: Negative for polydipsia, polyphagia and polyuria.  Genitourinary: Negative for dysuria, urgency and frequency.  Musculoskeletal:       Generalized body aches  Skin: Negative for rash.  Neurological: Negative for dizziness, light-headedness and headaches.   Past Medical History  Diagnosis Date  . Hypertension   . Diabetes mellitus   . Hearing loss   . CAD (coronary artery disease)   . Hemochromatosis   . Hyperlipidemia   . Diabetic retinopathy     Social History   Social History  . Marital Status: Widowed    Spouse Name: N/A  . Number of Children: 1  . Years of Education: 12   Occupational History  . retired    Social History Main Topics  . Smoking status: Never Smoker   . Smokeless tobacco: Never Used  . Alcohol Use: Yes     Comment: occ beer but very rare  . Drug Use: No  . Sexual Activity:    Partners: Female   Other Topics Concern  . Not on file   Social History  Narrative   HSG. Married - '77 - '04. 1 son '80. Work - semi-retired. Has a girlfriend.    Past Surgical History  Procedure Laterality Date  . Inner ear surgery      right  . 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 Asthma Mother   . Heart failure Mother   . Emphysema Mother   . Alcohol abuse Father   . Cancer Father     prostate  . Stroke Father   . Heart disease Neg Hx   . Rectal cancer Neg Hx   . Stomach cancer Neg Hx   . Colon cancer Maternal Uncle     dx'd 86    No Active Allergies  Current Outpatient Prescriptions on File Prior to Visit  Medication Sig Dispense Refill  . amLODipine (NORVASC) 10 MG tablet Take 1 tablet (10 mg total) by mouth daily. 90 tablet 3  . aspirin 81 MG tablet Take 81 mg by mouth 2 (two) times daily.     . cloNIDine (CATAPRES) 0.1 MG tablet Take 1 tablet (0.1 mg total) by mouth daily as needed. If BP is over 180 60 tablet 11  . insulin glargine (LANTUS) 100 UNIT/ML injection Inject 0.12 mLs (12 Units total) into the skin  at bedtime. 30 mL 3  . insulin NPH-regular (NOVOLIN 70/30) (70-30) 100 UNIT/ML injection Inject 15-17 Units into the skin 2 (two) times daily with a meal. Total of 32 units per day    . levothyroxine (SYNTHROID, LEVOTHROID) 100 MCG tablet Take 1 tablet (100 mcg total) by mouth daily before breakfast. 90 tablet 3  . losartan (COZAAR) 100 MG tablet Take 1 tablet (100 mg total) by mouth daily. 90 tablet 3  . metoprolol tartrate (LOPRESSOR) 25 MG tablet Take 0.5 tablets (12.5 mg total) by mouth 2 (two) times daily. 90 tablet 1  . simvastatin (ZOCOR) 20 MG tablet Take 1 tablet (20 mg total) by mouth daily. 90 tablet 3   No current facility-administered medications on file prior to visit.    BP 160/80 mmHg  Temp(Src) 97.8 F (36.6 C)  (Oral)  Wt 178 lb (80.74 kg)       Objective:   Physical Exam  Constitutional: He is oriented to person, place, and time. He appears well-developed and well-nourished.  HENT:  Nose: Rhinorrhea present. Right sinus exhibits no maxillary sinus tenderness and no frontal sinus tenderness. Left sinus exhibits no maxillary sinus tenderness and no frontal sinus tenderness.  Mouth/Throat: Mucous membranes are normal. No oropharyngeal exudate or posterior oropharyngeal erythema.  Sclerotic TMs noted with tympanostomy tube noted in L ear. Cerumen noted in R ear, unable to visualize TM on R side. Patient denies ear pain/pressure  Eyes: Pupils are equal, round, and reactive to light.  Cardiovascular: Normal rate and regular rhythm.   Pulmonary/Chest: Effort normal and breath sounds normal. He has no wheezes. He has no rales.  Lymphadenopathy:    He has no cervical adenopathy.  Neurological: He is alert and oriented to person, place, and time.  Skin: Skin is warm and dry. No rash noted.  Psychiatric: He has a normal mood and affect. His behavior is normal.       Assessment & Plan:  1. Acute upper respiratory infection Supportive measures of increased rest, fluids, and Mucinex DM for cough as needed. Advised patient to RTC if symptoms worsen, do not improve in 3-4 days, or fever develops >101.  Exam and history do not support diagnosis of influenza. Patient was concerned that he has flu symptoms and requested tamiflu for symptoms.  Discussed with patient that symptoms have been present >48 hours, and with other symptoms not present, tamiflu is not indicated at this time. He voiced understanding and agreed with plan.  2. Elevated blood pressure Retake of BP: 160/80. Patient stated that he has an upcoming appointment with his PCP for routine wellness and follow up for BP evaluation. Advised patient to monitor BP and document findings to bring with him to his appointment along with his BP cuff.  Encouraged decreasing sodium in his diet and also advised that he check his blood sugar as directed by his PCP.  Advised patient to seek medical attention if he notes any chest pain, arm pain, SOB, headaches, or nosebleeds. He voiced understanding and agreed with plan.

## 2015-11-18 ENCOUNTER — Encounter: Payer: Self-pay | Admitting: Internal Medicine

## 2015-11-18 ENCOUNTER — Ambulatory Visit (INDEPENDENT_AMBULATORY_CARE_PROVIDER_SITE_OTHER): Payer: BLUE CROSS/BLUE SHIELD | Admitting: Internal Medicine

## 2015-11-18 ENCOUNTER — Other Ambulatory Visit (INDEPENDENT_AMBULATORY_CARE_PROVIDER_SITE_OTHER): Payer: BLUE CROSS/BLUE SHIELD

## 2015-11-18 VITALS — BP 130/78 | HR 62 | Temp 97.7°F | Ht 74.0 in | Wt 180.0 lb

## 2015-11-18 DIAGNOSIS — E1065 Type 1 diabetes mellitus with hyperglycemia: Secondary | ICD-10-CM

## 2015-11-18 DIAGNOSIS — E1059 Type 1 diabetes mellitus with other circulatory complications: Secondary | ICD-10-CM | POA: Diagnosis not present

## 2015-11-18 DIAGNOSIS — E103599 Type 1 diabetes mellitus with proliferative diabetic retinopathy without macular edema, unspecified eye: Secondary | ICD-10-CM

## 2015-11-18 DIAGNOSIS — IMO0002 Reserved for concepts with insufficient information to code with codable children: Secondary | ICD-10-CM

## 2015-11-18 DIAGNOSIS — I70209 Unspecified atherosclerosis of native arteries of extremities, unspecified extremity: Secondary | ICD-10-CM

## 2015-11-18 DIAGNOSIS — E038 Other specified hypothyroidism: Secondary | ICD-10-CM

## 2015-11-18 DIAGNOSIS — E1051 Type 1 diabetes mellitus with diabetic peripheral angiopathy without gangrene: Secondary | ICD-10-CM

## 2015-11-18 DIAGNOSIS — I10 Essential (primary) hypertension: Secondary | ICD-10-CM

## 2015-11-18 DIAGNOSIS — E876 Hypokalemia: Secondary | ICD-10-CM

## 2015-11-18 DIAGNOSIS — R739 Hyperglycemia, unspecified: Secondary | ICD-10-CM | POA: Diagnosis not present

## 2015-11-18 LAB — BASIC METABOLIC PANEL
BUN: 14 mg/dL (ref 6–23)
CALCIUM: 9.3 mg/dL (ref 8.4–10.5)
CHLORIDE: 102 meq/L (ref 96–112)
CO2: 31 meq/L (ref 19–32)
CREATININE: 1.08 mg/dL (ref 0.40–1.50)
GFR: 72.98 mL/min (ref >60.00)
GLUCOSE: 53 mg/dL — AB (ref 70–99)
Potassium: 3.2 mEq/L — ABNORMAL LOW (ref 3.5–5.1)
SODIUM: 142 meq/L (ref 135–145)

## 2015-11-18 LAB — URINALYSIS, ROUTINE W REFLEX MICROSCOPIC
Bilirubin Urine: NEGATIVE
HGB URINE DIPSTICK: NEGATIVE
KETONES UR: NEGATIVE
LEUKOCYTES UA: NEGATIVE
NITRITE: NEGATIVE
Total Protein, Urine: NEGATIVE
URINE GLUCOSE: NEGATIVE
UROBILINOGEN UA: 1 (ref 0.0–1.0)
pH: 5.5 (ref 5.0–8.0)

## 2015-11-18 LAB — MICROALBUMIN / CREATININE URINE RATIO
CREATININE, U: 95.5 mg/dL
MICROALB/CREAT RATIO: 4.3 mg/g (ref 0.0–30.0)
Microalb, Ur: 4.1 mg/dL — ABNORMAL HIGH (ref 0.0–1.9)

## 2015-11-18 LAB — HEMOGLOBIN A1C: Hemoglobin A1C: 8.3

## 2015-11-18 LAB — TSH: TSH: 1.94 u[IU]/mL (ref 0.35–4.50)

## 2015-11-18 LAB — POCT GLYCOSYLATED HEMOGLOBIN (HGB A1C): HEMOGLOBIN A1C: 8.3

## 2015-11-18 MED ORDER — POTASSIUM CHLORIDE CRYS ER 20 MEQ PO TBCR
20.0000 meq | EXTENDED_RELEASE_TABLET | Freq: Two times a day (BID) | ORAL | Status: DC
Start: 2015-11-18 — End: 2016-03-29

## 2015-11-18 NOTE — Patient Instructions (Signed)

## 2015-11-18 NOTE — Progress Notes (Signed)
Pre visit review using our clinic review tool, if applicable. No additional management support is needed unless otherwise documented below in the visit note. 

## 2015-11-18 NOTE — Progress Notes (Signed)
Subjective:  Patient ID: Logan [name], male    DOB: [birth year]  Age: 65 y.o. MRN: [id number]  CC: Hypertension; Hypothyroidism; and Diabetes   HPI Logan [name] presents for follow-up on the above medical problems. He feels well and offers no complaints. He has been able to lower his blood sugar with better food choices. He's had no recent episodes of polyuria/polydipsia/polyphagia.  Outpatient Prescriptions Prior to Visit  Medication Sig Dispense Refill  . amLODipine (NORVASC) 10 MG tablet Take 1 tablet (10 mg total) by mouth daily. 90 tablet 3  . aspirin 81 MG tablet Take 81 mg by mouth 2 (two) times daily.     . cloNIDine (CATAPRES) 0.1 MG tablet Take 1 tablet (0.1 mg total) by mouth daily as needed. If BP is over 180 60 tablet 11  . insulin glargine (LANTUS) 100 UNIT/ML injection Inject 0.12 mLs (12 Units total) into the skin at bedtime. 30 mL 3  . insulin NPH-regular (NOVOLIN 70/30) (70-30) 100 UNIT/ML injection Inject 15-17 Units into the skin 2 (two) times daily with a meal. Total of 32 units per day    . levothyroxine (SYNTHROID, LEVOTHROID) 100 MCG tablet Take 1 tablet (100 mcg total) by mouth daily before breakfast. 90 tablet 3  . losartan (COZAAR) 100 MG tablet Take 1 tablet (100 mg total) by mouth daily. 90 tablet 3  . metoprolol tartrate (LOPRESSOR) 25 MG tablet Take 0.5 tablets (12.5 mg total) by mouth 2 (two) times daily. 90 tablet 1  . simvastatin (ZOCOR) 20 MG tablet Take 1 tablet (20 mg total) by mouth daily. 90 tablet 3   No facility-administered medications prior to visit.    ROS Review of Systems  Constitutional: Negative.  Negative for fever, chills, diaphoresis, appetite change and fatigue.  HENT: Negative.  Negative for sinus pressure and trouble swallowing.   Eyes: Negative.   Respiratory: Negative.  Negative for cough, choking, chest tightness, shortness of breath and stridor.   Cardiovascular: Negative.  Negative for chest pain, palpitations and leg  swelling.  Gastrointestinal: Negative.  Negative for nausea, vomiting, abdominal pain, diarrhea and constipation.  Endocrine: Negative.  Negative for polydipsia, polyphagia and polyuria.  Genitourinary: Negative.  Negative for difficulty urinating.  Musculoskeletal: Negative.  Negative for myalgias, back pain, joint swelling, arthralgias and neck pain.  Skin: Negative.  Negative for color change and rash.  Allergic/Immunologic: Negative.   Neurological: Negative.  Negative for dizziness, tremors, light-headedness, numbness and headaches.  Hematological: Negative.  Negative for adenopathy. Does not bruise/bleed easily.  Psychiatric/Behavioral: Negative.     Objective:  BP 130/78 mmHg  Pulse 62  Temp(Src) 97.7 F (36.5 C) (Oral)  Ht 6\' 2"  (1.88 m)  Wt 180 lb (81.647 kg)  BMI 23.10 kg/m2  SpO2 99%  BP Readings from Last 3 Encounters:  11/18/15 130/78  08/17/15 160/80  06/23/15 148/80    Wt Readings from Last 3 Encounters:  11/18/15 180 lb (81.647 kg)  08/17/15 178 lb (80.74 kg)  06/23/15 179 lb (81.194 kg)    Physical Exam  Constitutional: He is oriented to person, place, and time. No distress.  HENT:  Mouth/Throat: Oropharynx is clear and moist. No oropharyngeal exudate.  Eyes: Conjunctivae are normal. Right eye exhibits no discharge. Left eye exhibits no discharge. No scleral icterus.  Neck: Normal range of motion. Neck supple. No JVD present. No tracheal deviation present. No thyromegaly present.  Cardiovascular: Normal rate, regular rhythm, normal heart sounds and intact distal pulses.  Exam reveals no gallop  and no friction rub.   No murmur heard. Pulmonary/Chest: Effort normal and breath sounds normal. No stridor. No respiratory distress. He has no wheezes. He has no rales. He exhibits no tenderness.  Abdominal: Soft. Bowel sounds are normal. He exhibits no distension and no mass. There is no tenderness. There is no rebound and no guarding.  Musculoskeletal: Normal  range of motion. He exhibits no edema or tenderness.  Lymphadenopathy:    He has no cervical adenopathy.  Neurological: He is oriented to person, place, and time.  Skin: Skin is warm and dry. No rash noted. He is not diaphoretic. No erythema. No pallor.  Vitals reviewed.   Lab Results  Component Value Date   WBC 8.2 06/23/2015   HGB 15.2 06/23/2015   HCT 45.0 06/23/2015   PLT 219.0 06/23/2015   GLUCOSE 53* 11/18/2015   CHOL 141 06/23/2015   TRIG 79.0 06/23/2015   HDL 40.90 06/23/2015   LDLCALC 84 06/23/2015   ALT 23 10/07/2014   AST 20 10/07/2014   NA 142 11/18/2015   K 3.2* 11/18/2015   CL 102 11/18/2015   CREATININE 1.08 11/18/2015   BUN 14 11/18/2015   CO2 31 11/18/2015   TSH 1.94 11/18/2015   PSA 1.61 08/20/2014   HGBA1C 8.3 11/18/2015   MICROALBUR 4.1* 11/18/2015    Ct Temporal Bones W/o Cm  07/18/2014  CLINICAL DATA:  BILATERAL chronic otitis media with severe hearing loss. EXAM: CT TEMPORAL BONES WITHOUT CONTRAST TECHNIQUE: Axial and coronal plane CT imaging of the petrous temporal bones was performed with thin-collimation image reconstruction. No intravenous contrast was administered. Multiplanar CT image reconstructions were also generated. COMPARISON:  None. FINDINGS: The RIGHT tympanic membrane is silhouetted by fluid and/or soft tissue in the RIGHT middle ear and fluid/soft tissue in the bony external canal, lateral to the ear drum. The osseous margins of the bony external canal appears slightly irregular, although the scutum is intact. There is marked disruption of the ossicular chain on the RIGHT. No recognizable malleo-incudal joint. Probable displacement of the stapes away from the oval window. There is soft tissue in the attic and thinning of the tegmen tympani. There is widening of the aditus ad antrum, coalescence of the mastoid air cells, and thinning or dehiscence of the tegmen mastoideum, all consistent with chronic and widespread cholesteatoma. RIGHT inner ear  structures intact without take down of the bony margins of the semicircular canals. On the LEFT, there is similar but less severe disease. The external canal is unremarkable. LEFT TM is retracted. Anatomic ossicular anatomy is observed without erosion or displacement of the malleus, incus, or stapes. Within the LEFT middle ear cavity, there is soft tissue/fluid inferiorly, but the attic remains aerated with normal-appearing tegmen tympani. Slight widening of the LEFT aditus ad antrum and coalescence of LEFT mastoid air cells but intact tegmen mastoideum. Normal LEFT inner ear structures. Unremarkable appearing noncontrast intracranial compartment. Normal visualized TMJs and extracranial soft tissues. No sinus air-fluid level is evident. IMPRESSION: Severe BILATERAL chronic otitis media with BILATERAL middle ear and mastoid cholesteatomas, RIGHT much worse than LEFT. 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 Similar/less severe changes on the LEFT. Dehiscence of the tegmen tympani and tegmen mastoideum on the RIGHT is suspected. Within limits of visualization on noncontrast exam, no visible intracranial complication. Electronically Signed   By: Rolla Flatten M.D.   On: 07/18/2014 15:34    Assessment & Plan:   Jenson was seen  today for hypertension, hypothyroidism and diabetes.  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 -     Cancel: Hemoglobin A1c; Future -     Microalbumin / creatinine urine ratio; Future  Essential hypertension- his blood pressure is well-controlled, I will treat the hypokalemia - this is most likely related to poor by mouth intake of electrolytes as  he is not on a thiazide diuretic and has no symptoms of nutritional deficiencies or GI sources of potassium loss. -     Basic metabolic panel; Future -     Urinalysis, Routine w reflex microscopic (not at [facility]); Future -     potassium chloride SA (K-DUR,KLOR-CON) 20 MEQ tablet; Take 1 tablet (20 mEq total) by mouth 2 (two) times daily.  Other specified hypothyroidism- TSH is in the normal range, he will remain on the current dose of Synthroid. 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 -     potassium chloride SA (K-DUR,KLOR-CON) 20 MEQ tablet; Take 1 tablet (20 mEq total) by mouth 2 (two) times daily.  I am having Mr. Mesmer start on potassium chloride SA. I am also having him maintain his aspirin, cloNIDine, insulin NPH-regular Human, metoprolol tartrate, amLODipine, levothyroxine, losartan, insulin glargine, and simvastatin.  Meds ordered this encounter  Medications  . potassium chloride SA (K-DUR,KLOR-CON) 20 MEQ tablet    Sig: Take 1 tablet (20 mEq total) by mouth 2 (two) times daily.    Dispense:  60 tablet    Refill:  3     Follow-up: Return in about 6 months (around 05/19/2016).  Scarlette Calico, MD

## 2015-12-30 ENCOUNTER — Other Ambulatory Visit (INDEPENDENT_AMBULATORY_CARE_PROVIDER_SITE_OTHER): Payer: BLUE CROSS/BLUE SHIELD

## 2015-12-30 ENCOUNTER — Ambulatory Visit (INDEPENDENT_AMBULATORY_CARE_PROVIDER_SITE_OTHER): Payer: BLUE CROSS/BLUE SHIELD | Admitting: Internal Medicine

## 2015-12-30 ENCOUNTER — Encounter: Payer: Self-pay | Admitting: Internal Medicine

## 2015-12-30 VITALS — BP 158/60 | HR 59 | Temp 97.7°F | Resp 16 | Ht 74.0 in | Wt 180.5 lb

## 2015-12-30 DIAGNOSIS — I10 Essential (primary) hypertension: Secondary | ICD-10-CM

## 2015-12-30 DIAGNOSIS — R9431 Abnormal electrocardiogram [ECG] [EKG]: Secondary | ICD-10-CM | POA: Diagnosis not present

## 2015-12-30 DIAGNOSIS — E876 Hypokalemia: Secondary | ICD-10-CM

## 2015-12-30 DIAGNOSIS — I2510 Atherosclerotic heart disease of native coronary artery without angina pectoris: Secondary | ICD-10-CM | POA: Diagnosis not present

## 2015-12-30 LAB — MAGNESIUM: MAGNESIUM: 2 mg/dL (ref 1.5–2.5)

## 2015-12-30 LAB — BASIC METABOLIC PANEL
BUN: 17 mg/dL (ref 6–23)
CALCIUM: 9.6 mg/dL (ref 8.4–10.5)
CO2: 30 meq/L (ref 19–32)
Chloride: 102 mEq/L (ref 96–112)
Creatinine, Ser: 1.19 mg/dL (ref 0.40–1.50)
GFR: 65.23 mL/min (ref >60.00)
Glucose, Bld: 69 mg/dL — ABNORMAL LOW (ref 70–99)
Potassium: 3.7 mEq/L (ref 3.5–5.1)
SODIUM: 141 meq/L (ref 135–145)

## 2015-12-30 NOTE — Progress Notes (Signed)
Subjective:  Patient ID: Logan [name], male    DOB: [birth year]  Age: 65 y.o. MRN: [id number]  CC: Hypertension   HPI NOTNAMED SWOVELAND presents for a concern about a vague symptom that occurred a week ago. He was cutting the grass with a push lawnmower and just had a strange sensation all over his body. He does not describe it as chest pain, shortness of breath, DOE, diaphoresis, neck pain, edema, or fatigue. Then again, one day prior to this visit he said he cut the grass again with a push lawnmower for 3 hours and did not experience any symptoms. Again he cannot describe his symptom over the last week in any other detail. He denies headache or blurred vision. He has a history of coronary artery disease and was last seen by cardiologist 7 months ago at which time no problems were noted.  Outpatient Medications Prior to Visit  Medication Sig Dispense Refill  . amLODipine (NORVASC) 10 MG tablet Take 1 tablet (10 mg total) by mouth daily. 90 tablet 3  . aspirin 81 MG tablet Take 81 mg by mouth 2 (two) times daily.     . insulin glargine (LANTUS) 100 UNIT/ML injection Inject 0.12 mLs (12 Units total) into the skin at bedtime. 30 mL 3  . insulin NPH-regular (NOVOLIN 70/30) (70-30) 100 UNIT/ML injection Inject 15-17 Units into the skin 2 (two) times daily with a meal. Total of 32 units per day    . levothyroxine (SYNTHROID, LEVOTHROID) 100 MCG tablet Take 1 tablet (100 mcg total) by mouth daily before breakfast. 90 tablet 3  . losartan (COZAAR) 100 MG tablet Take 1 tablet (100 mg total) by mouth daily. 90 tablet 3  . metoprolol tartrate (LOPRESSOR) 25 MG tablet Take 0.5 tablets (12.5 mg total) by mouth 2 (two) times daily. 90 tablet 1  . potassium chloride SA (K-DUR,KLOR-CON) 20 MEQ tablet Take 1 tablet (20 mEq total) by mouth 2 (two) times daily. 60 tablet 3  . simvastatin (ZOCOR) 20 MG tablet Take 1 tablet (20 mg total) by mouth daily. 90 tablet 3  . cloNIDine (CATAPRES) 0.1 MG tablet Take 1 tablet  (0.1 mg total) by mouth daily as needed. If BP is over 180 (Patient not taking: Reported on 12/30/2015) 60 tablet 11   No facility-administered medications prior to visit.     ROS Review of Systems  Constitutional: Negative for activity change, chills, diaphoresis, fatigue and unexpected weight change.  HENT: Negative.  Negative for sinus pressure and trouble swallowing.   Eyes: Negative for visual disturbance.  Respiratory: Negative for apnea, cough, choking, chest tightness, shortness of breath, wheezing and stridor.   Cardiovascular: Negative.  Negative for chest pain, palpitations and leg swelling.  Gastrointestinal: Negative.  Negative for abdominal pain, diarrhea, nausea and vomiting.  Endocrine: Negative.   Genitourinary: Negative.   Musculoskeletal: Negative.  Negative for back pain and neck pain.  Skin: Negative.  Negative for color change.  Allergic/Immunologic: Negative.   Neurological: Negative.  Negative for dizziness, weakness, light-headedness, numbness and headaches.  Hematological: Negative.  Negative for adenopathy. Does not bruise/bleed easily.  Psychiatric/Behavioral: Negative.     Objective:  BP (!) 158/60 (BP Location: Left Arm, Patient Position: Sitting, Cuff Size: Normal)   Pulse (!) 59   Temp 97.7 F (36.5 C) (Oral)   Resp 16   Ht 6\' 2"  (1.88 m)   Wt 180 lb 8 oz (81.9 kg)   SpO2 98%   BMI 23.17 kg/m   BP  Readings from Last 3 Encounters:  12/30/15 (!) 158/60  11/18/15 130/78  08/17/15 (!) 160/80    Wt Readings from Last 3 Encounters:  12/30/15 180 lb 8 oz (81.9 kg)  11/18/15 180 lb (81.6 kg)  08/17/15 178 lb (80.7 kg)    Physical Exam  Constitutional: He is oriented to person, place, and time. No distress.  HENT:  Mouth/Throat: Oropharynx is clear and moist. No oropharyngeal exudate.  Eyes: Conjunctivae are normal. Right eye exhibits no discharge. Left eye exhibits no discharge. No scleral icterus.  Neck: Normal range of motion. Neck supple.  No JVD present. No tracheal deviation present. No thyromegaly present.  Cardiovascular: Normal rate, regular rhythm, normal heart sounds and intact distal pulses.  Exam reveals no gallop and no friction rub.   No murmur heard. EKG ----  Sinus  Rhythm  Low voltage in precordial leads.   -Old inferior infarct  -Poor R-wave progression -may be secondary to pulmonary disease   consider old anterior infarct.   ABNORMAL - no significant change  Pulmonary/Chest: Effort normal and breath sounds normal. No stridor. No respiratory distress. He has no wheezes. He has no rales. He exhibits no tenderness.  Abdominal: Soft. Bowel sounds are normal. He exhibits no distension. There is no tenderness. There is no rebound and no guarding.  Musculoskeletal: Normal range of motion. He exhibits no edema.  Lymphadenopathy:    He has no cervical adenopathy.  Neurological: He is oriented to person, place, and time.  Skin: Skin is warm and dry. No rash noted. He is not diaphoretic. No erythema. No pallor.  Vitals reviewed.   Lab Results  Component Value Date   WBC 8.2 06/23/2015   HGB 15.2 06/23/2015   HCT 45.0 06/23/2015   PLT 219.0 06/23/2015   GLUCOSE 69 (L) 12/30/2015   CHOL 141 06/23/2015   TRIG 79.0 06/23/2015   HDL 40.90 06/23/2015   LDLCALC 84 06/23/2015   ALT 23 10/07/2014   AST 20 10/07/2014   NA 141 12/30/2015   K 3.7 12/30/2015   CL 102 12/30/2015   CREATININE 1.19 12/30/2015   BUN 17 12/30/2015   CO2 30 12/30/2015   TSH 1.94 11/18/2015   PSA 1.61 08/20/2014   HGBA1C 8.3 11/18/2015   MICROALBUR 4.1 (H) 11/18/2015    Ct Temporal Bones W/o Cm  Result Date: 07/18/2014 CLINICAL DATA:  BILATERAL chronic otitis media with severe hearing loss. EXAM: CT TEMPORAL BONES WITHOUT CONTRAST TECHNIQUE: Axial and coronal plane CT imaging of the petrous temporal bones was performed with thin-collimation image reconstruction. No intravenous contrast was administered. Multiplanar CT image  reconstructions were also generated. COMPARISON:  None. FINDINGS: The RIGHT tympanic membrane is silhouetted by fluid and/or soft tissue in the RIGHT middle ear and fluid/soft tissue in the bony external canal, lateral to the ear drum. The osseous margins of the bony external canal appears slightly irregular, although the scutum is intact. There is marked disruption of the ossicular chain on the RIGHT. No recognizable malleo-incudal joint. Probable displacement of the stapes away from the oval window. There is soft tissue in the attic and thinning of the tegmen tympani. There is widening of the aditus ad antrum, coalescence of the mastoid air cells, and thinning or dehiscence of the tegmen mastoideum, all consistent with chronic and widespread cholesteatoma. RIGHT inner ear structures intact without take down of the bony margins of the semicircular canals. On the LEFT, there is similar but less severe disease. The external canal is unremarkable. LEFT TM  is retracted. Anatomic ossicular anatomy is observed without erosion or displacement of the malleus, incus, or stapes. Within the LEFT middle ear cavity, there is soft tissue/fluid inferiorly, but the attic remains aerated with normal-appearing tegmen tympani. Slight widening of the LEFT aditus ad antrum and coalescence of LEFT mastoid air cells but intact tegmen mastoideum. Normal LEFT inner ear structures. Unremarkable appearing noncontrast intracranial compartment. Normal visualized TMJs and extracranial soft tissues. No sinus air-fluid level is evident. IMPRESSION: Severe BILATERAL chronic otitis media with BILATERAL middle ear and mastoid cholesteatomas, RIGHT much worse than LEFT. 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 Similar/less severe changes on the LEFT. Dehiscence of the tegmen tympani and tegmen mastoideum on the RIGHT is suspected. Within limits of visualization on noncontrast  exam, no visible intracranial complication. Electronically Signed   By: Rolla Flatten M.D.   On: 07/18/2014 15:34    Assessment & Plan:   Diontae was seen today for hypertension.  Diagnoses and all orders for this visit:  Essential hypertension- his blood pressure is adequately well-controlled, will monitor his electrolytes and renal function. -     Magnesium; Future -     Basic metabolic panel; Future -     EKG 12-Lead  Hypokalemia- his potassium level is normal now, will continue potassium replacement therapy. -     Magnesium; Future -     Basic metabolic panel; Future  Atherosclerosis of native coronary artery of native heart without angina pectoris- his EKG is unchanged, he has had some vague, poorly describes symptoms related to exertion so I've asked him to undergo a myocardial perfusion imaging to screen for ischemia. -     Myocardial Perfusion Imaging; Future  Nonspecific abnormal electrocardiogram (ECG) (EKG)- as above. -     Myocardial Perfusion Imaging; Future   I am having Mr. Barot maintain his aspirin, cloNIDine, insulin NPH-regular Human, metoprolol tartrate, amLODipine, levothyroxine, losartan, insulin glargine, simvastatin, and potassium chloride SA.  No orders of the defined types were placed in this encounter.    Follow-up: Return in about 6 weeks (around 02/10/2016).  Scarlette Calico, MD

## 2015-12-30 NOTE — Patient Instructions (Signed)
Hypertension Hypertension, commonly called high blood pressure, is when the force of blood pumping through your arteries is too strong. Your arteries are the blood vessels that carry blood from your heart throughout your body. A blood pressure reading consists of a higher number over a lower number, such as 110/72. The higher number (systolic) is the pressure inside your arteries when your heart pumps. The lower number (diastolic) is the pressure inside your arteries when your heart relaxes. Ideally you want your blood pressure below 120/80. Hypertension forces your heart to work harder to pump blood. Your arteries may become narrow or stiff. Having untreated or uncontrolled hypertension can cause heart attack, stroke, kidney disease, and other problems. RISK FACTORS Some risk factors for high blood pressure are controllable. Others are not.  Risk factors you cannot control include:   Race. You may be at higher risk if you are African American.  Age. Risk increases with age.  Gender. Men are at higher risk than women before age 45 years. After age 65, women are at higher risk than men. Risk factors you can control include:  Not getting enough exercise or physical activity.  Being overweight.  Getting too much fat, sugar, calories, or salt in your diet.  Drinking too much alcohol. SIGNS AND SYMPTOMS Hypertension does not usually cause signs or symptoms. Extremely high blood pressure (hypertensive crisis) may cause headache, anxiety, shortness of breath, and nosebleed. DIAGNOSIS To check if you have hypertension, your health care provider will measure your blood pressure while you are seated, with your arm held at the level of your heart. It should be measured at least twice using the same arm. Certain conditions can cause a difference in blood pressure between your right and left arms. A blood pressure reading that is higher than normal on one occasion does not mean that you need treatment. If  it is not clear whether you have high blood pressure, you may be asked to return on a different day to have your blood pressure checked again. Or, you may be asked to monitor your blood pressure at home for 1 or more weeks. TREATMENT Treating high blood pressure includes making lifestyle changes and possibly taking medicine. Living a healthy lifestyle can help lower high blood pressure. You may need to change some of your habits. Lifestyle changes may include:  Following the DASH diet. This diet is high in fruits, vegetables, and whole grains. It is low in salt, red meat, and added sugars.  Keep your sodium intake below 2,300 mg per day.  Getting at least 30-45 minutes of aerobic exercise at least 4 times per week.  Losing weight if necessary.  Not smoking.  Limiting alcoholic beverages.  Learning ways to reduce stress. Your health care provider may prescribe medicine if lifestyle changes are not enough to get your blood pressure under control, and if one of the following is true:  You are 18-59 years of age and your systolic blood pressure is above 140.  You are 60 years of age or older, and your systolic blood pressure is above 150.  Your diastolic blood pressure is above 90.  You have diabetes, and your systolic blood pressure is over 140 or your diastolic blood pressure is over 90.  You have kidney disease and your blood pressure is above 140/90.  You have heart disease and your blood pressure is above 140/90. Your personal target blood pressure may vary depending on your medical conditions, your age, and other factors. HOME CARE INSTRUCTIONS    Have your blood pressure rechecked as directed by your health care provider.   Take medicines only as directed by your health care provider. Follow the directions carefully. Blood pressure medicines must be taken as prescribed. The medicine does not work as well when you skip doses. Skipping doses also puts you at risk for  problems.  Do not smoke.   Monitor your blood pressure at home as directed by your health care provider. SEEK MEDICAL CARE IF:   You think you are having a reaction to medicines taken.  You have recurrent headaches or feel dizzy.  You have swelling in your ankles.  You have trouble with your vision. SEEK IMMEDIATE MEDICAL CARE IF:  You develop a severe headache or confusion.  You have unusual weakness, numbness, or feel faint.  You have severe chest or abdominal pain.  You vomit repeatedly.  You have trouble breathing. MAKE SURE YOU:   Understand these instructions.  Will watch your condition.  Will get help right away if you are not doing well or get worse.   This information is not intended to replace advice given to you by your health care provider. Make sure you discuss any questions you have with your health care provider.   Document Released: 05/23/2005 Document Revised: 10/07/2014 Document Reviewed: 03/15/2013 Elsevier Interactive Patient Education 2016 [employer]  

## 2015-12-30 NOTE — Progress Notes (Signed)
Pre visit review using our clinic review tool, if applicable. No additional management support is needed unless otherwise documented below in the visit note. 

## 2016-02-01 ENCOUNTER — Other Ambulatory Visit: Payer: Self-pay | Admitting: Internal Medicine

## 2016-02-01 DIAGNOSIS — I2583 Coronary atherosclerosis due to lipid rich plaque: Secondary | ICD-10-CM

## 2016-02-01 DIAGNOSIS — I2510 Atherosclerotic heart disease of native coronary artery without angina pectoris: Secondary | ICD-10-CM

## 2016-02-01 DIAGNOSIS — I10 Essential (primary) hypertension: Secondary | ICD-10-CM

## 2016-02-15 ENCOUNTER — Telehealth (HOSPITAL_COMMUNITY): Payer: Self-pay | Admitting: *Deleted

## 2016-02-15 NOTE — Telephone Encounter (Signed)
Attempted to call patient regarding upcoming appointment- no answer. Logan Alomar J Braeleigh Pyper, RN 

## 2016-02-16 ENCOUNTER — Encounter (HOSPITAL_COMMUNITY): Payer: BLUE CROSS/BLUE SHIELD

## 2016-03-29 ENCOUNTER — Encounter: Payer: Self-pay | Admitting: Internal Medicine

## 2016-03-29 ENCOUNTER — Ambulatory Visit (INDEPENDENT_AMBULATORY_CARE_PROVIDER_SITE_OTHER): Payer: Medicare Other | Admitting: Internal Medicine

## 2016-03-29 ENCOUNTER — Other Ambulatory Visit (INDEPENDENT_AMBULATORY_CARE_PROVIDER_SITE_OTHER): Payer: Medicare Other

## 2016-03-29 VITALS — BP 138/82 | HR 74 | Temp 97.6°F | Resp 16 | Ht 74.0 in | Wt 182.5 lb

## 2016-03-29 DIAGNOSIS — E103399 Type 1 diabetes mellitus with moderate nonproliferative diabetic retinopathy without macular edema, unspecified eye: Secondary | ICD-10-CM

## 2016-03-29 DIAGNOSIS — E039 Hypothyroidism, unspecified: Secondary | ICD-10-CM | POA: Diagnosis not present

## 2016-03-29 DIAGNOSIS — E1065 Type 1 diabetes mellitus with hyperglycemia: Secondary | ICD-10-CM | POA: Diagnosis not present

## 2016-03-29 DIAGNOSIS — R972 Elevated prostate specific antigen [PSA]: Secondary | ICD-10-CM | POA: Insufficient documentation

## 2016-03-29 DIAGNOSIS — I70209 Unspecified atherosclerosis of native arteries of extremities, unspecified extremity: Secondary | ICD-10-CM

## 2016-03-29 DIAGNOSIS — E1051 Type 1 diabetes mellitus with diabetic peripheral angiopathy without gangrene: Secondary | ICD-10-CM | POA: Diagnosis not present

## 2016-03-29 DIAGNOSIS — I2510 Atherosclerotic heart disease of native coronary artery without angina pectoris: Secondary | ICD-10-CM

## 2016-03-29 DIAGNOSIS — R7989 Other specified abnormal findings of blood chemistry: Secondary | ICD-10-CM

## 2016-03-29 DIAGNOSIS — I2583 Coronary atherosclerosis due to lipid rich plaque: Secondary | ICD-10-CM | POA: Diagnosis not present

## 2016-03-29 DIAGNOSIS — Z0000 Encounter for general adult medical examination without abnormal findings: Secondary | ICD-10-CM

## 2016-03-29 DIAGNOSIS — E103599 Type 1 diabetes mellitus with proliferative diabetic retinopathy without macular edema, unspecified eye: Secondary | ICD-10-CM

## 2016-03-29 DIAGNOSIS — N400 Enlarged prostate without lower urinary tract symptoms: Secondary | ICD-10-CM

## 2016-03-29 DIAGNOSIS — Z23 Encounter for immunization: Secondary | ICD-10-CM | POA: Diagnosis not present

## 2016-03-29 DIAGNOSIS — I10 Essential (primary) hypertension: Secondary | ICD-10-CM

## 2016-03-29 DIAGNOSIS — R946 Abnormal results of thyroid function studies: Secondary | ICD-10-CM

## 2016-03-29 DIAGNOSIS — L602 Onychogryphosis: Secondary | ICD-10-CM | POA: Diagnosis not present

## 2016-03-29 DIAGNOSIS — E785 Hyperlipidemia, unspecified: Secondary | ICD-10-CM | POA: Diagnosis not present

## 2016-03-29 DIAGNOSIS — E876 Hypokalemia: Secondary | ICD-10-CM | POA: Diagnosis not present

## 2016-03-29 LAB — LIPID PANEL
CHOLESTEROL: 157 mg/dL (ref 0–200)
HDL: 42.7 mg/dL (ref >39.00)
LDL Cholesterol: 90 mg/dL (ref 0–99)
NonHDL: 114.48
TRIGLYCERIDES: 124 mg/dL (ref 0.0–149.0)
Total CHOL/HDL Ratio: 4
VLDL: 24.8 mg/dL (ref 0.0–40.0)

## 2016-03-29 LAB — CBC WITH DIFFERENTIAL/PLATELET
BASOS ABS: 0 10*3/uL (ref 0.0–0.1)
Basophils Relative: 0.3 % (ref 0.0–3.0)
Eosinophils Absolute: 0.3 10*3/uL (ref 0.0–0.7)
Eosinophils Relative: 3.8 % (ref 0.0–5.0)
HCT: 43.8 % (ref 39.0–52.0)
HEMOGLOBIN: 15 g/dL (ref 13.0–17.0)
LYMPHS ABS: 2 10*3/uL (ref 0.7–4.0)
Lymphocytes Relative: 22.6 % (ref 12.0–46.0)
MCHC: 34.3 g/dL (ref 30.0–36.0)
MCV: 90.8 fl (ref 78.0–100.0)
MONOS PCT: 8.2 % (ref 3.0–12.0)
Monocytes Absolute: 0.7 10*3/uL (ref 0.1–1.0)
NEUTROS PCT: 65.1 % (ref 43.0–77.0)
Neutro Abs: 5.7 10*3/uL (ref 1.4–7.7)
Platelets: 243 10*3/uL (ref 150.0–400.0)
RBC: 4.82 Mil/uL (ref 4.22–5.81)
RDW: 13.9 % (ref 11.5–15.5)
WBC: 8.8 10*3/uL (ref 4.0–10.5)

## 2016-03-29 LAB — COMPREHENSIVE METABOLIC PANEL
ALBUMIN: 4.6 g/dL (ref 3.5–5.2)
ALK PHOS: 177 U/L — AB (ref 39–117)
ALT: 30 U/L (ref 0–53)
AST: 22 U/L (ref 0–37)
BILIRUBIN TOTAL: 1 mg/dL (ref 0.2–1.2)
BUN: 16 mg/dL (ref 6–23)
CALCIUM: 9.8 mg/dL (ref 8.4–10.5)
CO2: 33 mEq/L — ABNORMAL HIGH (ref 19–32)
Chloride: 101 mEq/L (ref 96–112)
Creatinine, Ser: 1.02 mg/dL (ref 0.40–1.50)
GFR: 77.87 mL/min (ref >60.00)
GLUCOSE: 154 mg/dL — AB (ref 70–99)
Potassium: 4 mEq/L (ref 3.5–5.1)
Sodium: 141 mEq/L (ref 135–145)
TOTAL PROTEIN: 7.3 g/dL (ref 6.0–8.3)

## 2016-03-29 LAB — HEMOGLOBIN A1C: Hgb A1c MFr Bld: 8.6 % — ABNORMAL HIGH (ref 4.6–6.5)

## 2016-03-29 LAB — TSH: TSH: 2.68 u[IU]/mL (ref 0.35–4.50)

## 2016-03-29 LAB — PSA: PSA: 6.38 ng/mL — AB (ref 0.10–4.00)

## 2016-03-29 MED ORDER — METOPROLOL TARTRATE 25 MG PO TABS: 12.5000 mg | ORAL_TABLET | Freq: Two times a day (BID) | ORAL | 0 refills | Status: DC

## 2016-03-29 MED ORDER — LEVOTHYROXINE SODIUM 100 MCG PO TABS: 100.0000 ug | ORAL_TABLET | Freq: Every day | ORAL | 0 refills | Status: DC

## 2016-03-29 MED ORDER — INSULIN GLARGINE 100 UNIT/ML ~~LOC~~ SOLN: 12.0000 [IU] | Freq: Every day | SUBCUTANEOUS | 3 refills | Status: DC

## 2016-03-29 MED ORDER — SIMVASTATIN 20 MG PO TABS: 20.0000 mg | ORAL_TABLET | Freq: Every day | ORAL | 0 refills | Status: DC

## 2016-03-29 MED ORDER — AMLODIPINE BESYLATE 10 MG PO TABS: 10.0000 mg | ORAL_TABLET | Freq: Every day | ORAL | 0 refills | Status: DC

## 2016-03-29 MED ORDER — INSULIN NPH ISOPHANE & REGULAR (70-30) 100 UNIT/ML ~~LOC~~ SUSP: 15.0000 [IU] | Freq: Two times a day (BID) | SUBCUTANEOUS | 0 refills | Status: DC

## 2016-03-29 MED ORDER — CLONIDINE HCL 0.1 MG PO TABS: 0.1000 mg | ORAL_TABLET | Freq: Every day | ORAL | 0 refills | Status: DC | PRN

## 2016-03-29 MED ORDER — POTASSIUM CHLORIDE CRYS ER 20 MEQ PO TBCR: 20.0000 meq | EXTENDED_RELEASE_TABLET | Freq: Two times a day (BID) | ORAL | 0 refills | Status: DC

## 2016-03-29 MED ORDER — LOSARTAN POTASSIUM 100 MG PO TABS: 100.0000 mg | ORAL_TABLET | Freq: Every day | ORAL | 0 refills | Status: DC

## 2016-03-29 MED ORDER — ZOSTER VACCINE LIVE 19400 UNT/0.65ML ~~LOC~~ SUSR: 0.6500 mL | Freq: Once | SUBCUTANEOUS | 0 refills | Status: AC

## 2016-03-29 NOTE — Progress Notes (Signed)
Subjective:  Patient ID: Logan [name], male    DOB: September 06, 1950  Age: 65 y.o. MRN: [id number]  CC: Annual Exam; Diabetes; Hypertension; Hypothyroidism; and Hyperlipidemia   HPI BECKY MCKINZIE presents for a CPX.  He tells me his blood pressure has been well controlled. He has had a few episodes of ankle swelling, I told him this is probably related to the amlodipine therapy but he tells me he does not want to stop taking it. He has had no recent episodes of headache/blurred vision/chest pain/shortness of breath/palpitations/fatigue.  He also tells me he thinks his blood sugars are well-controlled because she has had no recent episodes of visual disturbance or polys.  Past Medical History:  Diagnosis Date  . CAD (coronary artery disease)   . Diabetes mellitus   . Diabetic retinopathy   . Hearing loss   . Hemochromatosis   . Hyperlipidemia   . Hypertension    Past Surgical History:  Procedure Laterality Date  . 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 INNER EAR SURGERY     right    reports that he has never smoked. He has never used smokeless tobacco. He reports that he drinks alcohol. He reports that he does not use drugs. family history includes Alcohol abuse in his father; Asthma in his mother; Cancer in his father; Colon cancer in his maternal uncle; Emphysema in his mother; Heart failure in his mother; Stroke in his father. No Active Allergies  Outpatient Medications Prior to Visit  Medication Sig Dispense Refill  . aspirin 81 MG tablet Take 81 mg by mouth 2 (two) times daily.     Marland Kitchen amLODipine (NORVASC) 10 MG tablet Take 1 tablet (10 mg total) by mouth daily. 90 tablet 3  . cloNIDine (CATAPRES) 0.1 MG tablet Take 1 tablet (0.1 mg total) by mouth daily as needed. If BP is over 180 60 tablet 11  . insulin glargine (LANTUS) 100  UNIT/ML injection Inject 0.12 mLs (12 Units total) into the skin at bedtime. 30 mL 3  . insulin NPH-regular (NOVOLIN 70/30) (70-30) 100 UNIT/ML injection Inject 15-17 Units into the skin 2 (two) times daily with a meal. Total of 32 units per day    . levothyroxine (SYNTHROID, LEVOTHROID) 100 MCG tablet Take 1 tablet (100 mcg total) by mouth daily before breakfast. 90 tablet 3  . losartan (COZAAR) 100 MG tablet Take 1 tablet (100 mg total) by mouth daily. 90 tablet 3  . metoprolol tartrate (LOPRESSOR) 25 MG tablet TAKE ONE-HALF TABLET BY MOUTH TWICE DAILY 90 tablet 1  . potassium chloride SA (K-DUR,KLOR-CON) 20 MEQ tablet Take 1 tablet (20 mEq total) by mouth 2 (two) times daily. 60 tablet 3  . simvastatin (ZOCOR) 20 MG tablet Take 1 tablet (20 mg total) by mouth daily. 90 tablet 3   No facility-administered medications prior to visit.     ROS Review of Systems  Constitutional: Negative.  Negative for appetite change, chills, diaphoresis, fatigue and fever.  HENT: Negative.   Eyes: Negative for photophobia and visual disturbance.  Respiratory: Negative for cough, choking, chest tightness, shortness of breath and stridor.   Cardiovascular: Positive for leg swelling.  Gastrointestinal: Negative.  Negative for abdominal pain, constipation, diarrhea, nausea and vomiting.  Endocrine: Negative.  Negative for cold intolerance, heat intolerance, polydipsia, polyphagia and polyuria.  Genitourinary: Negative.  Negative for difficulty urinating, dysuria, flank pain and frequency.  Musculoskeletal: Negative.  Negative for back pain, myalgias and neck pain.  Skin: Negative.   Allergic/Immunologic: Negative.   Neurological: Negative.  Negative for dizziness, tremors, weakness, light-headedness, numbness and headaches.  Hematological: Negative.  Negative for adenopathy. Does not bruise/bleed easily.  Psychiatric/Behavioral: Negative.     Objective:  BP 138/82 (BP Location: Left Arm, Patient Position:  Sitting, Cuff Size: Normal)   Pulse 74   Temp 97.6 F (36.4 C) (Oral)   Resp 16   Ht 6\' 2"  (1.88 m)   Wt 182 lb 8 oz (82.8 kg)   SpO2 96%   BMI 23.43 kg/m   BP Readings from Last 3 Encounters:  03/29/16 138/82  12/30/15 (!) 158/60  11/18/15 130/78    Wt Readings from Last 3 Encounters:  03/29/16 182 lb 8 oz (82.8 kg)  12/30/15 180 lb 8 oz (81.9 kg)  11/18/15 180 lb (81.6 kg)    Physical Exam  Constitutional: He is oriented to person, place, and time. No distress.  HENT:  Mouth/Throat: Oropharynx is clear and moist. No oropharyngeal exudate.  Eyes: Conjunctivae are normal. Right eye exhibits no discharge. Left eye exhibits no discharge. No scleral icterus.  Neck: Normal range of motion. Neck supple. No JVD present. No tracheal deviation present. No thyromegaly present.  Cardiovascular: Normal rate, regular rhythm, normal heart sounds and intact distal pulses.  Exam reveals no gallop and no friction rub.   No murmur heard. Pulmonary/Chest: Effort normal and breath sounds normal. No stridor. No respiratory distress. He has no wheezes. He has no rales. He exhibits no tenderness.  Abdominal: Soft. Bowel sounds are normal. He exhibits no distension and no mass. There is no tenderness. There is no rebound and no guarding. Hernia confirmed negative in the left inguinal area.  Genitourinary: Rectum normal, testes normal and penis normal. Rectal exam shows no external hemorrhoid, no internal hemorrhoid, no fissure, no mass, no tenderness, anal tone normal and guaiac negative stool. Prostate is enlarged (1+ smooth symm BPH). Prostate is not tender. Right testis shows no mass, no swelling and no tenderness. Right testis is descended. Left testis shows no mass, no swelling and no tenderness. Left testis is descended. Uncircumcised. No phimosis, paraphimosis, hypospadias, penile erythema or penile tenderness. No discharge found.  Musculoskeletal: Normal range of motion. He exhibits edema. He  exhibits no tenderness or deformity.  There is trace pitting edema in both lower extremities  Lymphadenopathy:    He has no cervical adenopathy.       Right: No inguinal adenopathy present.       Left: No inguinal adenopathy present.  Neurological: He is oriented to person, place, and time.  Skin: Skin is warm and dry. No rash noted. He is not diaphoretic. No erythema. No pallor.  Psychiatric: He has a normal mood and affect. His behavior is normal. Judgment and thought content normal.  Vitals reviewed.   Lab Results  Component Value Date   WBC 8.8 03/29/2016   HGB 15.0 03/29/2016   HCT 43.8 03/29/2016   PLT 243.0 03/29/2016   GLUCOSE 154 (H) 03/29/2016   CHOL 157 03/29/2016   TRIG 124.0 03/29/2016   HDL 42.70 03/29/2016   LDLCALC 90 03/29/2016   ALT 30 03/29/2016   AST 22 03/29/2016   NA 141 03/29/2016   K 4.0 03/29/2016   CL 101 03/29/2016   CREATININE 1.02 03/29/2016   BUN 16 03/29/2016   CO2 33 (H) 03/29/2016  TSH 2.68 03/29/2016   PSA 6.38 (H) 03/29/2016   HGBA1C 8.6 (H) 03/29/2016   MICROALBUR 4.1 (H) 11/18/2015    Ct Temporal Bones W/o Cm  Result Date: 07/18/2014 CLINICAL DATA:  BILATERAL chronic otitis media with severe hearing loss. EXAM: CT TEMPORAL BONES WITHOUT CONTRAST TECHNIQUE: Axial and coronal plane CT imaging of the petrous temporal bones was performed with thin-collimation image reconstruction. No intravenous contrast was administered. Multiplanar CT image reconstructions were also generated. COMPARISON:  None. FINDINGS: The RIGHT tympanic membrane is silhouetted by fluid and/or soft tissue in the RIGHT middle ear and fluid/soft tissue in the bony external canal, lateral to the ear drum. The osseous margins of the bony external canal appears slightly irregular, although the scutum is intact. There is marked disruption of the ossicular chain on the RIGHT. No recognizable malleo-incudal joint. Probable displacement of the stapes away from the oval window.  There is soft tissue in the attic and thinning of the tegmen tympani. There is widening of the aditus ad antrum, coalescence of the mastoid air cells, and thinning or dehiscence of the tegmen mastoideum, all consistent with chronic and widespread cholesteatoma. RIGHT inner ear structures intact without take down of the bony margins of the semicircular canals. On the LEFT, there is similar but less severe disease. The external canal is unremarkable. LEFT TM is retracted. Anatomic ossicular anatomy is observed without erosion or displacement of the malleus, incus, or stapes. Within the LEFT middle ear cavity, there is soft tissue/fluid inferiorly, but the attic remains aerated with normal-appearing tegmen tympani. Slight widening of the LEFT aditus ad antrum and coalescence of LEFT mastoid air cells but intact tegmen mastoideum. Normal LEFT inner ear structures. Unremarkable appearing noncontrast intracranial compartment. Normal visualized TMJs and extracranial soft tissues. No sinus air-fluid level is evident. IMPRESSION: Severe BILATERAL chronic otitis media with BILATERAL middle ear and mastoid cholesteatomas, RIGHT much worse than LEFT. 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 Similar/less severe changes on the LEFT. Dehiscence of the tegmen tympani and tegmen mastoideum on the RIGHT is suspected. Within limits of visualization on noncontrast exam, no visible intracranial complication. Electronically Signed   By: Rolla Flatten M.D.   On: 07/18/2014 15:34    Assessment & Plan:   Ascencion was seen today for annual exam, diabetes, hypertension, hypothyroidism and hyperlipidemia.  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 -     insulin glargine (LANTUS) 100 UNIT/ML injection; Inject 0.12 mLs (12 Units total) into the skin at bedtime. 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 He tells me that he is doing the best that he can and is not willing to be more aggressive about blood sugar control. He is also not willing to see an endocrinologist. -     Ambulatory referral to Ophthalmology -     Ambulatory referral to Podiatry -     Hemoglobin A1c; Future  Hypothyroidism, unspecified type- his TSH is  in the normal range, he will remain on the current dose of levothyroxine. -     TSH; Future  Hyperlipidemia LDL goal <70- he is not quite achieved his LDL goal, will change to a more potent statin when he is due for refill -     Lipid panel; Future  Atherosclerosis of native coronary artery of native heart without angina pectoris- he's had no recent episodes of chest pain and tells me that he follow-up with his cardiologist soon. -     Lipid panel; Future  Essential hypertension- his blood pressures adequately well-controlled, lites and renal function are stable. -     potassium chloride SA (K-DUR,KLOR-CON) 20 MEQ tablet; Take 1 tablet (20 mEq total) by mouth 2 (two) times daily. -     metoprolol tartrate (LOPRESSOR) 25 MG tablet; Take 0.5 tablets (12.5 mg total) by mouth 2 (two) times daily. -     losartan (COZAAR) 100 MG tablet; Take 1 tablet (100 mg total) by mouth daily. -     amLODipine (NORVASC) 10 MG tablet; Take 1 tablet (10 mg total) by mouth daily. -     Comprehensive metabolic panel; Future -     CBC with Differential/Platelet; Future  Uncontrolled type 1 diabetes mellitus with moderate nonproliferative retinopathy without  macular edema, unspecified laterality ([facility]) -     simvastatin (ZOCOR) 20 MG tablet; Take 1 tablet (20 mg total) by mouth daily. -     Comprehensive metabolic panel; Future  Hypokalemia- improvement noted, will continue potassium replacement therapy. -     potassium chloride SA (K-DUR,KLOR-CON) 20 MEQ tablet; Take 1 tablet (20 mEq total) by mouth 2 (two) times daily.  Coronary atherosclerosis due to lipid rich plaque -     metoprolol tartrate (LOPRESSOR) 25 MG tablet; Take 0.5 tablets (12.5 mg total) by mouth 2 (two) times daily. -     Lipid panel; Future  Elevated TSH -     levothyroxine (SYNTHROID, LEVOTHROID) 100 MCG tablet; Take 1 tablet (100 mcg total) by mouth daily before breakfast.  Need for prophylactic vaccination and inoculation against influenza -     Flu vaccine HIGH DOSE PF (Fluzone High dose)  Benign prostatic hyperplasia without lower urinary tract symptoms -     PSA; Future  Routine health maintenance -     Zoster Vaccine Live, PF, (ZOSTAVAX) 09811 UNT/0.65ML injection; Inject 19,400 Units into the skin once.  Overgrown toenails -     Ambulatory referral to Podiatry  Need for prophylactic vaccination against Streptococcus pneumoniae (pneumococcus) -     Pneumococcal polysaccharide vaccine 23-valent greater than or equal to 2yo subcutaneous/IM  PSA elevation- his PSA has suddenly risen significantly, I have asked him to see urology to see if he needs to be screened for prostate cancer. -     Ambulatory referral to Urology  Other orders -     insulin NPH-regular Human (NOVOLIN 70/30) (70-30) 100 UNIT/ML injection; Inject 15-17 Units into the skin 2 (two) times daily with a meal. Total of 32 units per day -     Discontinue: cloNIDine (CATAPRES) 0.1 MG tablet; Take 1 tablet (0.1 mg total) by mouth daily as needed. If BP is over 180   I have discontinued Mr. Rothert's cloNIDine and cloNIDine. I have also changed his metoprolol tartrate and insulin NPH-regular Human.  Additionally, I am having him start on Zoster Vaccine Live (PF). Lastly, I am having him maintain his aspirin, simvastatin, potassium chloride SA, losartan, levothyroxine, insulin glargine, and amLODipine.  Meds ordered this encounter  Medications  . simvastatin (ZOCOR) 20 MG tablet    Sig: Take 1 tablet (20 mg total) by mouth daily.    Dispense:  90 tablet    Refill:  0  . potassium chloride SA (K-DUR,KLOR-CON) 20 MEQ tablet    Sig: Take 1 tablet (20 mEq total) by mouth 2 (two) times daily.    Dispense:  180 tablet    Refill:  0  . metoprolol tartrate (LOPRESSOR) 25 MG tablet    Sig: Take 0.5 tablets (12.5 mg total) by mouth 2 (two) times daily.    Dispense:  90 tablet    Refill:  0  . losartan (COZAAR) 100 MG tablet    Sig: Take 1 tablet (100 mg total) by mouth daily.    Dispense:  90 tablet    Refill:  0  . levothyroxine (SYNTHROID, LEVOTHROID) 100 MCG tablet    Sig: Take 1 tablet (100 mcg total) by mouth daily before breakfast.    Dispense:  90 tablet    Refill:  0  . insulin NPH-regular Human (NOVOLIN 70/30) (70-30) 100 UNIT/ML injection    Sig: Inject 15-17 Units into the skin 2 (two) times daily with a meal. Total of 32 units per day    Dispense:  30 mL    Refill:  0  . insulin glargine (LANTUS) 100 UNIT/ML injection    Sig: Inject 0.12 mLs (12 Units total) into the skin at bedtime.    Dispense:  30 mL    Refill:  3  . DISCONTD: cloNIDine (CATAPRES) 0.1 MG tablet    Sig: Take 1 tablet (0.1 mg total) by mouth daily as needed. If BP is over 180    Dispense:  60 tablet    Refill:  0  . amLODipine (NORVASC) 10 MG tablet    Sig: Take 1 tablet (10 mg total) by mouth daily.    Dispense:  90 tablet    Refill:  0  . Zoster Vaccine Live, PF, (ZOSTAVAX) 29562 UNT/0.65ML injection    Sig: Inject 19,400 Units into the skin once.    Dispense:  1 each    Refill:  0   See AVS for instructions about healthy living and anticipatory guidance.   Follow-up: Return in about 4 months  (around 07/30/2016).  Scarlette Calico, MD

## 2016-03-29 NOTE — Patient Instructions (Signed)

## 2016-03-29 NOTE — Progress Notes (Signed)
Pre visit review using our clinic review tool, if applicable. No additional management support is needed unless otherwise documented below in the visit note. 

## 2016-03-30 NOTE — Assessment & Plan Note (Signed)

## 2016-04-11 DIAGNOSIS — R972 Elevated prostate specific antigen [PSA]: Secondary | ICD-10-CM | POA: Diagnosis not present

## 2016-05-02 DIAGNOSIS — H2513 Age-related nuclear cataract, bilateral: Secondary | ICD-10-CM | POA: Diagnosis not present

## 2016-05-02 DIAGNOSIS — Z9889 Other specified postprocedural states: Secondary | ICD-10-CM | POA: Diagnosis not present

## 2016-05-02 DIAGNOSIS — E109 Type 1 diabetes mellitus without complications: Secondary | ICD-10-CM | POA: Diagnosis not present

## 2016-05-02 LAB — HM DIABETES EYE EXAM

## 2016-06-01 NOTE — Telephone Encounter (Signed)
error 

## 2016-06-24 ENCOUNTER — Encounter: Payer: Self-pay | Admitting: Internal Medicine

## 2016-07-01 ENCOUNTER — Ambulatory Visit (INDEPENDENT_AMBULATORY_CARE_PROVIDER_SITE_OTHER): Payer: Medicare Other | Admitting: Internal Medicine

## 2016-07-01 ENCOUNTER — Encounter: Payer: Self-pay | Admitting: Internal Medicine

## 2016-07-01 VITALS — BP 138/60 | HR 59 | Ht 74.0 in | Wt 183.0 lb

## 2016-07-01 DIAGNOSIS — I10 Essential (primary) hypertension: Secondary | ICD-10-CM | POA: Diagnosis not present

## 2016-07-01 DIAGNOSIS — I2510 Atherosclerotic heart disease of native coronary artery without angina pectoris: Secondary | ICD-10-CM | POA: Diagnosis not present

## 2016-07-01 MED ORDER — TRIAMTERENE-HCTZ 37.5-25 MG PO TABS: 0.5000 | ORAL_TABLET | Freq: Every day | ORAL | 3 refills | Status: DC

## 2016-07-01 MED ORDER — ROSUVASTATIN CALCIUM 10 MG PO TABS: 10.0000 mg | ORAL_TABLET | Freq: Every day | ORAL | 3 refills | Status: DC

## 2016-07-01 NOTE — Patient Instructions (Signed)
Your physician has recommended you make the following change in your medication:  1.) stop metoprolol 2.) stop simvastatin 3.) start triamterene/hctz (MAXZIDE) 1/2 tablet once a day 4.) start rosuvastatin (Crestor) 10 mg once a day  Your physician recommends that you return for lab work on 07/11/16.   Your physician wants you to follow-up in: June, 2018 with Dr. Harrington Challenger.  You will receive a reminder letter in the mail two months in advance. If you don't receive a letter, please call our office to schedule the follow-up appointment.

## 2016-07-01 NOTE — Progress Notes (Signed)
Cardiology Office Note   Date:  07/01/2016   ID:  Logan [name], DOB 1950/06/16, MRN [id number]  PCP:  Scarlette Calico, MD  Cardiologist:   Dorris Carnes, MD    F?U of CAD    History of Present Illness: Logan [name] is a 66 y.o. male with a history ofhistory of CAD. 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 He had nonobstructive disease at catheterization in 2004. He had a negative Myoview scan in 2009 with an ejection fraction of 49%. Pt also had syncope in past Known bradycardia.   Pt last seen in 2016  Active  No SOB  NO CP   Pt admits that BP runs high     Current Meds  Medication Sig  . amLODipine (NORVASC) 10 MG tablet Take 1 tablet (10 mg total) by mouth daily.  Marland Kitchen aspirin 81 MG tablet Take 81 mg by mouth 2 (two) times daily.   . insulin glargine (LANTUS) 100 UNIT/ML injection Inject 0.12 mLs (12 Units total) into the skin at bedtime.  . insulin NPH-regular Human (NOVOLIN 70/30) (70-30) 100 UNIT/ML injection Inject 15-17 Units into the skin 2 (two) times daily with a meal. Total of 32 units per day  . levothyroxine (SYNTHROID, LEVOTHROID) 100 MCG tablet Take 1 tablet (100 mcg total) by mouth daily before breakfast.  . losartan (COZAAR) 100 MG tablet Take 1 tablet (100 mg total) by mouth daily.  . metoprolol tartrate (LOPRESSOR) 25 MG tablet Take 0.5 tablets (12.5 mg total) by mouth 2 (two) times daily.  . simvastatin (ZOCOR) 20 MG tablet Take 1 tablet (20 mg total) by mouth daily.     Allergies:   Patient has no known allergies.   Past Medical History:  Diagnosis Date  . CAD (coronary artery disease)   . Diabetes mellitus   . Diabetic retinopathy   . Hearing loss   . Hemochromatosis   . Hyperlipidemia   . Hypertension     Past Surgical History:  Procedure Laterality Date  . 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   Procedure: Right Great Toe Amputation;  Surgeon: Newt Minion, MD;  Location: [facility];  Service: Orthopedics;  Laterality: Right;  Right Great Toe  Amputation  . INNER EAR SURGERY     right     Social History:  The patient  reports that he has never smoked. He has never used smokeless tobacco. He reports that he drinks alcohol. He reports that he does not use drugs.   Family History:  The patient's family history includes Alcohol abuse in his father; Asthma in his mother; Cancer in his father; Colon cancer in his maternal uncle; Emphysema in his mother; Heart failure in his mother; Stroke in his father.    ROS:  Please see the history of present illness. All other systems are reviewed and  Negative to the above problem except as noted.    PHYSICAL EXAM: VS:  BP 138/60   Pulse (!) 59   Ht 6\' 2"  (1.88 m)   Wt 183 lb (83 kg)   SpO2 97%   BMI 23.50 kg/m   GEN: Well nourished, well developed, in no acute distress  HEENT: normal  Neck: no JVD, carotid bruits, or masses Cardiac: RRR; no murmurs, rubs, or gallops,no edema  Respiratory:  clear to auscultation bilaterally, normal work of breathing GI: soft, nontender, nondistended, + BS  No hepatomegaly  MS: no deformity Moving all extremities   Skin: warm and dry, no  rash Neuro:  Strength and sensation are intact Psych: euthymic mood, full affect   EKG:  EKG is not ordered today.   Lipid Panel    Component Value Date/Time   CHOL 157 03/29/2016 1404   TRIG 124.0 03/29/2016 1404   HDL 42.70 03/29/2016 1404   CHOLHDL 4 03/29/2016 1404   VLDL 24.8 03/29/2016 1404   LDLCALC 90 03/29/2016 1404      Wt Readings from Last 3 Encounters:  07/01/16 183 lb (83 kg)  03/29/16 182 lb 8 oz (82.8 kg)  12/30/15 180 lb 8 oz (81.9 kg)      ASSESSMENT AND PLAN: 1  CAD    I am not convinced of any active symptoms  He is very acitve  Follow   2  Bradycardia  HR a little slow  This is not unusual for him  Will pull bak on lopressor  I am not convinced it is doing much    3  [place]  4.  HL    Would recomm he switch to 10 Crestor for better control of LDL  Check labs in 8 wks    5.   HTN  BP is not optimal  Would stop metoprolol  Add 1/2 Maxzide 37.5//25  Follow up BMET in about 10 days   I will be in clincn that day to see how feeling  He was on HCTZ alone in past and complained of cramping      Current medicines are reviewed at length with the patient today.  The patient does not have concerns regarding medicines.  Signed, Dorris Carnes, MD  07/01/2016 8:01 AM    [address], [place], [place]  [address] Phone: [phone]; Fax: [phone]

## 2016-07-05 ENCOUNTER — Other Ambulatory Visit: Payer: Self-pay | Admitting: Internal Medicine

## 2016-07-05 DIAGNOSIS — E1065 Type 1 diabetes mellitus with hyperglycemia: Principal | ICD-10-CM

## 2016-07-05 DIAGNOSIS — I10 Essential (primary) hypertension: Secondary | ICD-10-CM

## 2016-07-05 DIAGNOSIS — I70209 Unspecified atherosclerosis of native arteries of extremities, unspecified extremity: Secondary | ICD-10-CM

## 2016-07-05 DIAGNOSIS — I2510 Atherosclerotic heart disease of native coronary artery without angina pectoris: Secondary | ICD-10-CM

## 2016-07-05 DIAGNOSIS — E1051 Type 1 diabetes mellitus with diabetic peripheral angiopathy without gangrene: Secondary | ICD-10-CM

## 2016-07-05 DIAGNOSIS — I2583 Coronary atherosclerosis due to lipid rich plaque: Secondary | ICD-10-CM

## 2016-07-05 DIAGNOSIS — E103399 Type 1 diabetes mellitus with moderate nonproliferative diabetic retinopathy without macular edema, unspecified eye: Secondary | ICD-10-CM

## 2016-07-05 DIAGNOSIS — R7989 Other specified abnormal findings of blood chemistry: Secondary | ICD-10-CM

## 2016-07-07 ENCOUNTER — Other Ambulatory Visit: Payer: Self-pay | Admitting: *Deleted

## 2016-07-11 ENCOUNTER — Other Ambulatory Visit: Payer: Medicare Other | Admitting: *Deleted

## 2016-07-11 ENCOUNTER — Telehealth: Payer: Self-pay | Admitting: Internal Medicine

## 2016-07-11 DIAGNOSIS — I10 Essential (primary) hypertension: Secondary | ICD-10-CM

## 2016-07-11 DIAGNOSIS — I2510 Atherosclerotic heart disease of native coronary artery without angina pectoris: Secondary | ICD-10-CM | POA: Diagnosis not present

## 2016-07-11 LAB — BASIC METABOLIC PANEL
BUN/Creatinine Ratio: 18 (ref 10–24)
BUN: 24 mg/dL (ref 8–27)
CO2: 25 mmol/L (ref 18–29)
Calcium: 9.4 mg/dL (ref 8.6–10.2)
Chloride: 91 mmol/L — ABNORMAL LOW (ref 96–106)
Creatinine, Ser: 1.35 mg/dL — ABNORMAL HIGH (ref 0.76–1.27)
GFR, EST AFRICAN AMERICAN: 63 mL/min/{1.73_m2} (ref >59)
GFR, EST NON AFRICAN AMERICAN: 55 mL/min/{1.73_m2} — AB (ref >59)
Glucose: 403 mg/dL — ABNORMAL HIGH (ref 65–99)
POTASSIUM: 3.9 mmol/L (ref 3.5–5.2)
SODIUM: 134 mmol/L (ref 134–144)

## 2016-07-11 NOTE — Telephone Encounter (Signed)
Walk In pt Form-BP readings Dropped off-placed in doctors Box.

## 2016-07-12 ENCOUNTER — Other Ambulatory Visit: Payer: Self-pay | Admitting: *Deleted

## 2016-07-12 DIAGNOSIS — I10 Essential (primary) hypertension: Secondary | ICD-10-CM

## 2016-07-14 ENCOUNTER — Telehealth: Payer: Self-pay | Admitting: Internal Medicine

## 2016-07-14 DIAGNOSIS — I10 Essential (primary) hypertension: Secondary | ICD-10-CM

## 2016-07-14 NOTE — Telephone Encounter (Signed)
Patient brought in log of BPs  106 to 175    Complains of very bad Cramping in legs since maxzide started I would recomm stopping  maxzide   Try 12.5 aldactone.  Keep up with BP  BMET in 7 days

## 2016-07-19 ENCOUNTER — Other Ambulatory Visit: Payer: Medicare Other | Admitting: *Deleted

## 2016-07-19 ENCOUNTER — Ambulatory Visit (INDEPENDENT_AMBULATORY_CARE_PROVIDER_SITE_OTHER): Payer: Medicare Other

## 2016-07-19 VITALS — BP 136/62 | Wt 176.4 lb

## 2016-07-19 DIAGNOSIS — I10 Essential (primary) hypertension: Secondary | ICD-10-CM | POA: Diagnosis not present

## 2016-07-19 DIAGNOSIS — Z79899 Other long term (current) drug therapy: Secondary | ICD-10-CM

## 2016-07-19 DIAGNOSIS — I2510 Atherosclerotic heart disease of native coronary artery without angina pectoris: Secondary | ICD-10-CM

## 2016-07-19 LAB — BASIC METABOLIC PANEL
BUN / CREAT RATIO: 16 (ref 10–24)
BUN: 22 mg/dL (ref 8–27)
CHLORIDE: 99 mmol/L (ref 96–106)
CO2: 24 mmol/L (ref 18–29)
CREATININE: 1.34 mg/dL — AB (ref 0.76–1.27)
Calcium: 9.4 mg/dL (ref 8.6–10.2)
GFR calc Af Amer: 64 mL/min/{1.73_m2} (ref >59)
GFR calc non Af Amer: 55 mL/min/{1.73_m2} — ABNORMAL LOW (ref >59)
GLUCOSE: 62 mg/dL — AB (ref 65–99)
Potassium: 3.9 mmol/L (ref 3.5–5.2)
SODIUM: 143 mmol/L (ref 134–144)

## 2016-07-19 NOTE — Patient Instructions (Addendum)
         NURSE VISIT  1.) Reason for visit: BP check - medication management - Maxzide. BP: 136/62  - right arm/sitting  2.) Name of MD requesting visit: Dr. Harrington Challenger  3.) H&P: Pt STOPPED Metoprolol and STARTED Maxzide 37.5-25 mg on 07/02/16  4.) ROS related to problem: Pt stated he had leg cramps after starting Maxzide. Note from Dr. Harrington Challenger on 07/14/16 recommenced stopping Maxzide and starting Aldactone. Pt reported symptoms resolved 1 week ago.  Pt prefers to stay on Maxzide.   5.) Assessment and plan per MD: DOD Dr. Acie Fredrickson stated to continue with current medications. Will information to Dr. Harrington Challenger.

## 2016-07-20 NOTE — Telephone Encounter (Signed)
Patient dropped another list of blood pressures off from 07/11/16-07/19/16  I called him and he states he is feeling pretty good.  After 3rd day of Maxzide, he has felt good.  No leg cramps.  He is drinking a lot of water like he usually does.  BP this am is 131/68.   Did not advise of medication change or blood work at this time.  Will review BPs with Dr. Harrington Challenger tomorrow during clinic and if any further recommendations I will call patient back.

## 2016-07-24 NOTE — Telephone Encounter (Signed)
Check BMET again in 2 wks  Following kidney function and potassium

## 2016-08-04 NOTE — Addendum Note (Signed)
Addended by: Rodman Key on: 08/04/2016 09:11 AM   Modules accepted: Orders

## 2016-08-04 NOTE — Telephone Encounter (Signed)
Patient will come Monday for BMET.  Appointment made. Blood pressures have continued to be ok.  Highest systolic was one reading of 160 but others around 120s-130s over 50s-60s.    This am 114/57.

## 2016-08-08 ENCOUNTER — Other Ambulatory Visit: Payer: Medicare Other | Admitting: *Deleted

## 2016-08-08 DIAGNOSIS — I10 Essential (primary) hypertension: Secondary | ICD-10-CM

## 2016-08-08 DIAGNOSIS — R9431 Abnormal electrocardiogram [ECG] [EKG]: Secondary | ICD-10-CM

## 2016-08-08 DIAGNOSIS — I2510 Atherosclerotic heart disease of native coronary artery without angina pectoris: Secondary | ICD-10-CM | POA: Diagnosis not present

## 2016-08-08 DIAGNOSIS — Z79899 Other long term (current) drug therapy: Secondary | ICD-10-CM | POA: Diagnosis not present

## 2016-08-08 LAB — PRO B NATRIURETIC PEPTIDE: NT-Pro BNP: 49 pg/mL (ref 0–376)

## 2016-08-08 LAB — BASIC METABOLIC PANEL
BUN / CREAT RATIO: 15 (ref 10–24)
BUN: 17 mg/dL (ref 8–27)
CO2: 21 mmol/L (ref 18–29)
Calcium: 9.3 mg/dL (ref 8.6–10.2)
Chloride: 98 mmol/L (ref 96–106)
Creatinine, Ser: 1.11 mg/dL (ref 0.76–1.27)
GFR, EST AFRICAN AMERICAN: 80 mL/min/{1.73_m2} (ref >59)
GFR, EST NON AFRICAN AMERICAN: 69 mL/min/{1.73_m2} (ref >59)
Glucose: 77 mg/dL (ref 65–99)
POTASSIUM: 3.8 mmol/L (ref 3.5–5.2)
SODIUM: 140 mmol/L (ref 134–144)

## 2016-08-08 NOTE — Addendum Note (Signed)
Addended by: Eulis Foster on: 08/08/2016 08:54 AM   Modules accepted: Orders

## 2016-08-10 ENCOUNTER — Telehealth: Payer: Self-pay | Admitting: Internal Medicine

## 2016-08-10 NOTE — Telephone Encounter (Signed)
New message    Pt is returning call to [facility] about labs.

## 2016-08-10 NOTE — Telephone Encounter (Signed)
I spoke with pt and reviewed lab result from 08/08/16 with him. He reports BP has been running 110-120/60's.  Last evening it was 167/81 but this is the highest it has been.  Will make note on lab results and forward to Dr. Harrington Challenger for review.

## 2016-08-29 ENCOUNTER — Telehealth: Payer: Self-pay | Admitting: Internal Medicine

## 2016-08-29 MED ORDER — GLUCOSE BLOOD VI STRP: ORAL_STRIP | 12 refills | Status: DC

## 2016-08-29 NOTE — Telephone Encounter (Signed)
Pt need refill on his freestyle testing strips   To pharmacy on file  [facility]

## 2016-08-29 NOTE — Telephone Encounter (Signed)
erx sent

## 2016-08-31 ENCOUNTER — Ambulatory Visit (INDEPENDENT_AMBULATORY_CARE_PROVIDER_SITE_OTHER): Payer: Medicare Other | Admitting: Physician Assistant

## 2016-08-31 ENCOUNTER — Encounter: Payer: Self-pay | Admitting: Physician Assistant

## 2016-08-31 VITALS — BP 148/62 | HR 75 | Ht 74.0 in | Wt 175.0 lb

## 2016-08-31 DIAGNOSIS — I2510 Atherosclerotic heart disease of native coronary artery without angina pectoris: Secondary | ICD-10-CM | POA: Diagnosis not present

## 2016-08-31 DIAGNOSIS — E1051 Type 1 diabetes mellitus with diabetic peripheral angiopathy without gangrene: Secondary | ICD-10-CM

## 2016-08-31 DIAGNOSIS — E785 Hyperlipidemia, unspecified: Secondary | ICD-10-CM | POA: Diagnosis not present

## 2016-08-31 DIAGNOSIS — E103599 Type 1 diabetes mellitus with proliferative diabetic retinopathy without macular edema, unspecified eye: Secondary | ICD-10-CM | POA: Diagnosis not present

## 2016-08-31 DIAGNOSIS — R079 Chest pain, unspecified: Secondary | ICD-10-CM

## 2016-08-31 DIAGNOSIS — E1065 Type 1 diabetes mellitus with hyperglycemia: Secondary | ICD-10-CM

## 2016-08-31 DIAGNOSIS — I10 Essential (primary) hypertension: Secondary | ICD-10-CM

## 2016-08-31 DIAGNOSIS — I70209 Unspecified atherosclerosis of native arteries of extremities, unspecified extremity: Secondary | ICD-10-CM

## 2016-08-31 NOTE — Progress Notes (Signed)
Cardiology Office Note:    Date:  08/31/2016   ID:  Logan [name], DOB Nov 07, 1950, MRN [id number]  PCP:  Scarlette Calico, MD  Cardiologist:  Dr. Dorris Carnes   Electrophysiologist:  n/a  Referring MD: Janith Lima, MD   Chief Complaint  Patient presents with  . Chest Pain    History of Present Illness:    Logan [name] is a 66 y.o. male with a hx of CAD status post anterior MI in 2001 treated with angioplasty of LAD and nonobstructive CAD by cardiac catheterization 2004. Myoview in 2009 was low risk. Last seen by Dr. Harrington Challenger 1/18. Beta blocker was stopped secondary to bradycardia. Blood pressure was elevated. Diuretic was added to his regimen.  FU labs demonstrated worsening Creatinine and the Maxzide was to be DC'd.  FU Creatinine 08/08/16 was normal.  However, he tells me that he has continued to take this medication.    He presents to the office today with complaints of "being out of sync" for a few days last week.  His symptoms are hard to describe.  He notes several brief chest pains that only last 2-3 seconds.  He never really had chest pain with his MI in 2001.  He feels nauseated at times.  He denies shortness of breath, orthopnea, PND, edema.  He denies syncope.  His sugars were low one day but do not seem to coincide with him feeling bad.  He denies fevers, cough, wheezing, vomiting, diarrhea, melena, hematochezia.    Prior CV studies:   The following studies were reviewed today:  Myoview 3/09 EF 49, anteroapical scar with trivial peri-infarct ischemia  LHC 3/04 LM normal LAD mid-distal 60; D1 ostial 70, D2 70 ostial LCx ostial 30 RCA proximal and mid 30, distal 30-40, PDA 30 EF 50-55  Past Medical History:  Diagnosis Date  . Coronary artery disease involving native coronary artery of native heart without angina pectoris 11/01/2007   Last Cath '04: LAD w/ 60% long lesion; 1st Dx 70% ostial; 2nd Dx  70% ostial; Cx 30% ostial; RCA-dominant multiple 30-40% lesions; PDA 30%  lesions  Last nuclear stress March '09 - negative for ischemia   . Diabetes mellitus   . Diabetic retinopathy   . Hearing loss   . Hemochromatosis   . Hyperlipidemia   . Hypertension     Past Surgical History:  Procedure Laterality Date  . 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 INNER EAR SURGERY     right    Current Medications: Current Meds  Medication Sig  . amLODipine (NORVASC) 10 MG tablet TAKE 1 TABLET BY MOUTH EVERY DAY  . aspirin 81 MG tablet Take 81 mg by mouth 2 (two) times daily.   Marland Kitchen glucose blood (COOL BLOOD GLUCOSE TEST STRIPS) test strip Use to check blood sugar daily.  . insulin glargine (LANTUS) 100 UNIT/ML injection Inject 0.12 mLs (12 Units total) into the skin at bedtime.  Marland Kitchen levothyroxine (SYNTHROID, LEVOTHROID) 100 MCG tablet TAKE 1 TABLET BY MOUTH DAILY BEFORE BREAKFAST.  Marland Kitchen losartan (COZAAR) 100 MG tablet TAKE 1 TABLET BY MOUTH EVERY DAY  . NOVOLIN 70/30 (70-30) 100 UNIT/ML injection INJECT 15-17 UNITS INTO THE SKIN 2 (TWO) TIMES DAILY WITH A MEAL. TOTAL OF 32 UNITS PER DAY  . rosuvastatin (CRESTOR) 10 MG tablet Take 1 tablet (10 mg total)  by mouth daily.  Marland Kitchen triamterene-hydrochlorothiazide (MAXZIDE-25) 37.5-25 MG tablet Take 0.5 tablets by mouth daily.     Allergies:   Patient has no known allergies.   Social History   Social History  . Marital status: Widowed    Spouse name: N/A  . Number of children: 1  . Years of education: 84   Occupational History  . retired [employer]   Social History Main Topics  . Smoking status: Never Smoker  . Smokeless tobacco: Never Used  . Alcohol use Yes     Comment: occ beer but very rare  . Drug use: No  . Sexual activity: Yes    Partners: Female   Other Topics Concern  . None   Social History Narrative   HSG. Married - '77 - '04. 1 son '80. Work -  semi-retired. Has a girlfriend.     Family History  Problem Relation Age of Onset  . Asthma Mother   . Heart failure Mother   . Emphysema Mother   . Alcohol abuse Father   . Cancer Father     prostate  . Stroke Father   . Colon cancer Maternal Uncle     dx'd 25  . Heart disease Neg Hx   . Rectal cancer Neg Hx   . Stomach cancer Neg Hx      ROS:   Please see the history of present illness.    Review of Systems  HENT: Positive for hearing loss.   Eyes: Positive for visual disturbance.   All other systems reviewed and are negative.   EKGs/Labs/Other Test Reviewed:    EKG:  EKG is  ordered today.  The ekg ordered today demonstrates NSR, HR 75, LAD, ant-sept Q waves, QTc 395 ms  Recent Labs: 12/30/2015: Magnesium 2.0 03/29/2016: ALT 30; Hemoglobin 15.0; Platelets 243.0; TSH 2.68 08/08/2016: BUN 17; Creatinine, Ser 1.11; NT-Pro BNP 49; Potassium 3.8; Sodium 140   Recent Lipid Panel    Component Value Date/Time   CHOL 157 03/29/2016 1404   TRIG 124.0 03/29/2016 1404   HDL 42.70 03/29/2016 1404   CHOLHDL 4 03/29/2016 1404   VLDL 24.8 03/29/2016 1404   LDLCALC 90 03/29/2016 1404    Physical Exam:    VS:  BP (!) 148/62   Pulse 75   Ht 6\' 2"  (1.88 m)   Wt 175 lb (79.4 kg)   BMI 22.47 kg/m     Wt Readings from Last 3 Encounters:  08/31/16 175 lb (79.4 kg)  07/19/16 176 lb 6.4 oz (80 kg)  07/01/16 183 lb (83 kg)     Physical Exam  Constitutional: He is oriented to person, place, and time. He appears well-developed and well-nourished. No distress.  HENT:  Head: Normocephalic and atraumatic.  Eyes: No scleral icterus.  Neck: Normal range of motion. No JVD present.  Cardiovascular: Normal rate, regular rhythm, S1 normal and S2 normal.   No murmur heard. Pulmonary/Chest: Effort normal and breath sounds normal. He has no wheezes. He has no rhonchi. He has no rales.  Abdominal: Soft. There is no tenderness.  Musculoskeletal: He exhibits no edema.  Neurological: He  is alert and oriented to person, place, and time.  Skin: Skin is warm and dry.  Psychiatric: He has a normal mood and affect.    ASSESSMENT:    1. Chest pain, unspecified type   2. Essential hypertension   3. Coronary artery disease involving native coronary artery of native heart without angina pectoris   4. Hyperlipidemia  LDL goal <70   5. 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 Chest pain, unspecified type -  He presents today with unclear symptoms. He does note some atypical chest pains. He also notes some nausea. His symptoms with his myocardial infarction in 2001 were very typical. His ECG is unchanged. However, it has been almost 10 years since his last assessment for ischemia. I am unable to explain his symptoms. I question whether or not his symptoms may be an atypical presentation for ischemia. I spent a great deal of time today discussing with him the benefits of proceeding with stress testing. He refuses to proceed and will call back when he is ready.  2. Essential hypertension -  Borderline elevated here today. He notes his blood pressures are optimal at home. He was to stop Maxide previously but never did. His last creatinine was back to normal. Repeat BMET today to ensure renal function and potassium have remained normal.  3. Coronary artery disease involving native coronary artery of native heart without angina pectoris - As noted, his symptoms are somewhat atypical. He does not have classic chest pain. However, he did not have significant chest with his myocardial infarction in 2001. I have recommended proceeding with stress testing. As noted, he refused. Continue aspirin, statin. He is no longer on beta blocker secondary to bradycardia.  4. Hyperlipidemia LDL goal <70 -  Continue statin.  5. Uncontrolled type 1 diabetes mellitus with  proliferative retinopathy without macular edema, unspecified laterality ([place]) -  Continue follow-up with primary care.  Dispo:  Return in about 4 weeks (around 09/28/2016) for Close Follow Up, w/ Dr. Harrington Challenger.   Medication Adjustments/Labs and Tests Ordered: Current medicines are reviewed at length with the patient today.  Concerns regarding medicines are outlined above.  Medication changes, Labs and Tests ordered today are outlined in the Patient Instructions noted below. Patient Instructions  Medication Instructions:  Your physician recommends that you continue on your current medications as directed. Please refer to the Current Medication list given to you today.  Labwork: TODAY BMET  Testing/Procedures: 1. Your physician has requested that you have en exercise stress myoview. For further information please visit [email or web address]. Please follow instruction sheet, as given.  Follow-Up: DR. Harrington Challenger IN 1 MONTH  Any Other Special Instructions Will Be Listed Below (If Applicable).  If you need a refill on your cardiac medications before your next appointment, please call your pharmacy.  Signed, Richardson Dopp, PA-C  08/31/2016 4:53 PM    [facility] [place], [place], [place]  [address] Phone: [phone]; Fax: [phone]

## 2016-08-31 NOTE — Patient Instructions (Addendum)
Medication Instructions:  Your physician recommends that you continue on your current medications as directed. Please refer to the Current Medication list given to you today.  Labwork: TODAY BMET  Testing/Procedures: 1. Your physician has requested that you have en exercise stress myoview. For further information please visit [email or web address]. Please follow instruction sheet, as given.  Follow-Up: DR. Harrington Challenger IN 1 MONTH  Any Other Special Instructions Will Be Listed Below (If Applicable).  If you need a refill on your cardiac medications before your next appointment, please call your pharmacy.

## 2016-09-01 LAB — BASIC METABOLIC PANEL
BUN/Creatinine Ratio: 15 (ref 10–24)
BUN: 18 mg/dL (ref 8–27)
CALCIUM: 9.5 mg/dL (ref 8.6–10.2)
CHLORIDE: 96 mmol/L (ref 96–106)
CO2: 28 mmol/L (ref 18–29)
CREATININE: 1.22 mg/dL (ref 0.76–1.27)
GFR calc non Af Amer: 62 mL/min/{1.73_m2} (ref >59)
GFR, EST AFRICAN AMERICAN: 71 mL/min/{1.73_m2} (ref >59)
Glucose: 125 mg/dL — ABNORMAL HIGH (ref 65–99)
Potassium: 4 mmol/L (ref 3.5–5.2)
Sodium: 141 mmol/L (ref 134–144)

## 2016-09-20 ENCOUNTER — Encounter: Payer: Self-pay | Admitting: Internal Medicine

## 2016-10-06 DIAGNOSIS — R972 Elevated prostate specific antigen [PSA]: Secondary | ICD-10-CM | POA: Diagnosis not present

## 2016-10-07 ENCOUNTER — Ambulatory Visit (INDEPENDENT_AMBULATORY_CARE_PROVIDER_SITE_OTHER): Payer: Medicare Other | Admitting: Internal Medicine

## 2016-10-07 ENCOUNTER — Encounter: Payer: Self-pay | Admitting: Internal Medicine

## 2016-10-07 VITALS — BP 112/52 | HR 77 | Ht 74.0 in | Wt 173.6 lb

## 2016-10-07 DIAGNOSIS — R42 Dizziness and giddiness: Secondary | ICD-10-CM | POA: Diagnosis not present

## 2016-10-07 DIAGNOSIS — I70209 Unspecified atherosclerosis of native arteries of extremities, unspecified extremity: Secondary | ICD-10-CM

## 2016-10-07 DIAGNOSIS — I2510 Atherosclerotic heart disease of native coronary artery without angina pectoris: Secondary | ICD-10-CM | POA: Diagnosis not present

## 2016-10-07 DIAGNOSIS — E782 Mixed hyperlipidemia: Secondary | ICD-10-CM | POA: Diagnosis not present

## 2016-10-07 DIAGNOSIS — I10 Essential (primary) hypertension: Secondary | ICD-10-CM

## 2016-10-07 DIAGNOSIS — E1051 Type 1 diabetes mellitus with diabetic peripheral angiopathy without gangrene: Secondary | ICD-10-CM

## 2016-10-07 MED ORDER — AMLODIPINE BESYLATE 10 MG PO TABS: 5.0000 mg | ORAL_TABLET | Freq: Every day | ORAL | 3 refills | Status: DC

## 2016-10-07 MED ORDER — LOSARTAN POTASSIUM 100 MG PO TABS: 50.0000 mg | ORAL_TABLET | Freq: Every day | ORAL | 3 refills | Status: DC

## 2016-10-07 NOTE — Progress Notes (Signed)
Cardiology Office Note   Date:  10/07/2016   ID:  Logan [name], DOB 1951/03/20, MRN [id number]  PCP:  Janith Lima, MD  Cardiologist:   Dorris Carnes, MD    F?U of CAD    History of Present Illness: Logan [name] is a 66 y.o. male with a history of history of CAD. 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 He had nonobstructive disease at catheterization in 2004. He had a negative Myoview scan in 2009 with an ejection fraction of 49%. Pt also had syncope in past Known bradycardia.  BP has been "rediculously low"  Mowed a lawn yesterday  Dnies CP  Breathing is OK    Current Meds  Medication Sig  . amLODipine (NORVASC) 10 MG tablet TAKE 1 TABLET BY MOUTH EVERY DAY  . aspirin 81 MG tablet Take 81 mg by mouth 2 (two) times daily.   Marland Kitchen glucose blood (COOL BLOOD GLUCOSE TEST STRIPS) test strip Use to check blood sugar daily.  . insulin glargine (LANTUS) 100 UNIT/ML injection Inject 0.12 mLs (12 Units total) into the skin at bedtime.  Marland Kitchen levothyroxine (SYNTHROID, LEVOTHROID) 100 MCG tablet TAKE 1 TABLET BY MOUTH DAILY BEFORE BREAKFAST.  Marland Kitchen losartan (COZAAR) 100 MG tablet TAKE 1 TABLET BY MOUTH EVERY DAY  . NOVOLIN 70/30 (70-30) 100 UNIT/ML injection INJECT 15-17 UNITS INTO THE SKIN 2 (TWO) TIMES DAILY WITH A MEAL. TOTAL OF 32 UNITS PER DAY  . rosuvastatin (CRESTOR) 10 MG tablet Take 1 tablet (10 mg total) by mouth daily.  Marland Kitchen triamterene-hydrochlorothiazide (MAXZIDE-25) 37.5-25 MG tablet Take 0.5 tablets by mouth daily.     Allergies:   Patient has no known allergies.   Past Medical History:  Diagnosis Date  . Coronary artery disease involving native coronary artery of native heart without angina pectoris 11/01/2007   Last Cath '04: LAD w/ 60% long lesion; 1st Dx 70% ostial; 2nd Dx  70% ostial; Cx 30% ostial; RCA-dominant multiple 30-40% lesions; PDA 30% lesions  Last nuclear stress March '09 - negative for ischemia   . Diabetes mellitus   . Diabetic retinopathy   .  Hearing loss   . Hemochromatosis   . Hyperlipidemia   . Hypertension     Past Surgical History:  Procedure Laterality Date  . 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 INNER EAR SURGERY     right     Social History:  The patient  reports that he has never smoked. He has never used smokeless tobacco. He reports that he drinks alcohol. He reports that he does not use drugs.   Family History:  The patient's family history includes Alcohol abuse in his father; Asthma in his mother; Cancer in his father; Colon cancer in his maternal uncle; Emphysema in his mother; Heart failure in his mother; Stroke in his father.    ROS:  Please see the history of present illness. All other systems are reviewed and  Negative to the above problem except as noted.    PHYSICAL EXAM: VS:  BP (!) 112/52   Pulse 77   Ht 6\' 2"  (1.88 m)   Wt 173 lb 9.6 oz (78.7 kg)   SpO2 97%   BMI 22.29 kg/m   GEN: Well nourished, well developed, in no acute distress  HEENT: normal  Neck: no  JVD, carotid bruits, or masses Cardiac: RRR; no murmurs, rubs, or gallops,no edema  Respiratory:  clear to auscultation bilaterally, normal work of breathing GI: soft, nontender, nondistended, + BS  No hepatomegaly  MS: no deformity Moving all extremities   Skin: warm and dry, no rash Neuro:  Strength and sensation are intact Psych: euthymic mood, full affect   EKG:  EKG is not ordered today.   Lipid Panel    Component Value Date/Time   CHOL 157 03/29/2016 1404   TRIG 124.0 03/29/2016 1404   HDL 42.70 03/29/2016 1404   CHOLHDL 4 03/29/2016 1404   VLDL 24.8 03/29/2016 1404   LDLCALC 90 03/29/2016 1404      Wt Readings from Last 3 Encounters:  10/07/16 173 lb 9.6 oz (78.7 kg)  08/31/16 175 lb (79.4 kg)  07/19/16 176 lb 6.4 oz (80 kg)      ASSESSMENT AND PLAN: 1  Blood pressure  BP is OK today but he says he does not feel good  Was low yesterday I would have him cut back on meds    Cut losartan in 1/2  Cut amlodipine in 1/2  F/U for BP check  Bring cuff and BP log    2  CAD  No symtoms of angina    3  HL  Keep on Crestor  Watch diet more closely   Will need to follow later this year   2  Bradycardia   HR OK  Follow    3  Thyrod   5.  HTN  Follow BP with decrease in meds    Current medicines are reviewed at length with the patient today.  The patient does not have concerns regarding medicines.  Signed, Dorris Carnes, MD  10/07/2016 3:57 PM    [facility] [place], [place], [place]  [address] Phone: [phone]; Fax: [phone]

## 2016-10-07 NOTE — Patient Instructions (Signed)
Your physician has recommended you make the following change in your medication:  1.) decrease losartan to 50 mg daily (1/2 tablet) 2.) decrease amlodipine to 10 mg daily (1/2 tablet)  Your physician recommends that you schedule a follow-up appointment for BP check with the nurse in 2-3 weeks.  Please bring your blood pressure cuff and your list of medications with you to this visit.

## 2016-10-14 DIAGNOSIS — R972 Elevated prostate specific antigen [PSA]: Secondary | ICD-10-CM | POA: Diagnosis not present

## 2016-11-01 ENCOUNTER — Ambulatory Visit (INDEPENDENT_AMBULATORY_CARE_PROVIDER_SITE_OTHER): Payer: Medicare Other | Admitting: Interventional Cardiology

## 2016-11-01 VITALS — HR 98 | Ht 74.0 in

## 2016-11-01 DIAGNOSIS — I10 Essential (primary) hypertension: Secondary | ICD-10-CM

## 2016-11-01 DIAGNOSIS — I959 Hypotension, unspecified: Secondary | ICD-10-CM

## 2016-11-01 MED ORDER — AMLODIPINE BESYLATE 10 MG PO TABS: 10.0000 mg | ORAL_TABLET | Freq: Every day | ORAL | 3 refills | Status: DC

## 2016-11-01 NOTE — Progress Notes (Signed)
Patient comes in today for BP check after low BP w/ medication changes at last OV w/ Dr. Harrington Challenger on 10/07/16. Today BP reads: 163/71 He brought his personal monitor and it was also checked for accuracy. Reviewed w/ Dr. Tamala Julian -- pt advised to increase Amlodipine back to 10 mg daily and call office if remains 130s or greater.  Discussed with nursing staff and confirm that I instructed an increase in amlodipine back to the baseline 10 mg per day. HS

## 2016-11-01 NOTE — Patient Instructions (Signed)
Medication Instructions:   Your physician has recommended you make the following change in your medication: 1) Increase Amlodipine back to 10 mg once daily  - If you need a refill on your cardiac medications before your next appointment, please call your pharmacy.   Labwork:  None ordered  Testing/Procedures:  None ordered  Follow-Up:  You will follow up with Dr. Harrington Challenger in November.  You will receive a letter in the mail to remind you to call in and make an appointment.  Thank you for choosing [facility]!!   Any Other Special Instructions Will Be Listed Below (If Applicable).  Keep track of your blood pressure.  Please call the office if the remain above 130s.

## 2016-11-23 ENCOUNTER — Telehealth: Payer: Self-pay | Admitting: Internal Medicine

## 2016-11-23 NOTE — Telephone Encounter (Signed)
LM for pt to schedule a Welcome to Medicare visit before 01/04/2017 with Dr Ronnald Ramp.

## 2016-12-13 ENCOUNTER — Other Ambulatory Visit: Payer: Self-pay | Admitting: Internal Medicine

## 2016-12-13 DIAGNOSIS — R7989 Other specified abnormal findings of blood chemistry: Secondary | ICD-10-CM

## 2016-12-20 ENCOUNTER — Other Ambulatory Visit: Payer: Self-pay | Admitting: Internal Medicine

## 2016-12-20 DIAGNOSIS — R7989 Other specified abnormal findings of blood chemistry: Secondary | ICD-10-CM

## 2016-12-29 ENCOUNTER — Encounter: Payer: Self-pay | Admitting: Internal Medicine

## 2016-12-29 ENCOUNTER — Ambulatory Visit (INDEPENDENT_AMBULATORY_CARE_PROVIDER_SITE_OTHER): Payer: Medicare Other | Admitting: Internal Medicine

## 2016-12-29 ENCOUNTER — Other Ambulatory Visit (INDEPENDENT_AMBULATORY_CARE_PROVIDER_SITE_OTHER): Payer: Medicare Other

## 2016-12-29 VITALS — BP 146/60 | HR 76 | Temp 97.5°F | Ht 74.0 in | Wt 176.5 lb

## 2016-12-29 DIAGNOSIS — E10311 Type 1 diabetes mellitus with unspecified diabetic retinopathy with macular edema: Secondary | ICD-10-CM | POA: Diagnosis not present

## 2016-12-29 DIAGNOSIS — I70209 Unspecified atherosclerosis of native arteries of extremities, unspecified extremity: Secondary | ICD-10-CM

## 2016-12-29 DIAGNOSIS — E1065 Type 1 diabetes mellitus with hyperglycemia: Secondary | ICD-10-CM

## 2016-12-29 DIAGNOSIS — N400 Enlarged prostate without lower urinary tract symptoms: Secondary | ICD-10-CM

## 2016-12-29 DIAGNOSIS — R972 Elevated prostate specific antigen [PSA]: Secondary | ICD-10-CM | POA: Diagnosis not present

## 2016-12-29 DIAGNOSIS — E039 Hypothyroidism, unspecified: Secondary | ICD-10-CM

## 2016-12-29 DIAGNOSIS — E1051 Type 1 diabetes mellitus with diabetic peripheral angiopathy without gangrene: Secondary | ICD-10-CM

## 2016-12-29 DIAGNOSIS — I10 Essential (primary) hypertension: Secondary | ICD-10-CM | POA: Diagnosis not present

## 2016-12-29 LAB — BASIC METABOLIC PANEL
BUN: 15 mg/dL (ref 6–23)
CALCIUM: 9.5 mg/dL (ref 8.4–10.5)
CO2: 32 mEq/L (ref 19–32)
Chloride: 102 mEq/L (ref 96–112)
Creatinine, Ser: 1.3 mg/dL (ref 0.40–1.50)
GFR: 58.72 mL/min — AB (ref >60.00)
GLUCOSE: 65 mg/dL — AB (ref 70–99)
Potassium: 3.8 mEq/L (ref 3.5–5.1)
SODIUM: 141 meq/L (ref 135–145)

## 2016-12-29 LAB — URINALYSIS, ROUTINE W REFLEX MICROSCOPIC
BILIRUBIN URINE: NEGATIVE
Hgb urine dipstick: NEGATIVE
KETONES UR: NEGATIVE
LEUKOCYTES UA: NEGATIVE
Nitrite: NEGATIVE
PH: 6.5 (ref 5.0–8.0)
RBC / HPF: NONE SEEN (ref 0–?)
Specific Gravity, Urine: 1.01 (ref 1.000–1.030)
TOTAL PROTEIN, URINE-UPE24: NEGATIVE
URINE GLUCOSE: NEGATIVE
UROBILINOGEN UA: 1 (ref 0.0–1.0)
WBC, UA: NONE SEEN (ref 0–?)

## 2016-12-29 LAB — TSH: TSH: 0.15 u[IU]/mL — ABNORMAL LOW (ref 0.35–4.50)

## 2016-12-29 LAB — CBC WITH DIFFERENTIAL/PLATELET
BASOS ABS: 0.1 10*3/uL (ref 0.0–0.1)
Basophils Relative: 1.3 % (ref 0.0–3.0)
Eosinophils Absolute: 0.3 10*3/uL (ref 0.0–0.7)
Eosinophils Relative: 4.9 % (ref 0.0–5.0)
HCT: 45.3 % (ref 39.0–52.0)
HEMOGLOBIN: 15.5 g/dL (ref 13.0–17.0)
LYMPHS ABS: 1.8 10*3/uL (ref 0.7–4.0)
Lymphocytes Relative: 26.5 % (ref 12.0–46.0)
MCHC: 34.1 g/dL (ref 30.0–36.0)
MCV: 93.3 fl (ref 78.0–100.0)
MONO ABS: 0.7 10*3/uL (ref 0.1–1.0)
MONOS PCT: 10.2 % (ref 3.0–12.0)
NEUTROS PCT: 57.1 % (ref 43.0–77.0)
Neutro Abs: 3.9 10*3/uL (ref 1.4–7.7)
Platelets: 262 10*3/uL (ref 150.0–400.0)
RBC: 4.86 Mil/uL (ref 4.22–5.81)
RDW: 13.9 % (ref 11.5–15.5)
WBC: 6.8 10*3/uL (ref 4.0–10.5)

## 2016-12-29 LAB — MICROALBUMIN / CREATININE URINE RATIO
Creatinine,U: 120.7 mg/dL
Microalb Creat Ratio: 1.5 mg/g (ref 0.0–30.0)
Microalb, Ur: 1.8 mg/dL (ref 0.0–1.9)

## 2016-12-29 LAB — HEMOGLOBIN A1C: HEMOGLOBIN A1C: 8.8 % — AB (ref 4.6–6.5)

## 2016-12-29 LAB — PSA: PSA: 2.2 ng/mL (ref 0.10–4.00)

## 2016-12-29 MED ORDER — INSULIN NPH ISOPHANE & REGULAR (70-30) 100 UNIT/ML ~~LOC~~ SUSP: 20.0000 [IU] | Freq: Two times a day (BID) | SUBCUTANEOUS | 11 refills | Status: DC

## 2016-12-29 MED ORDER — INSULIN GLARGINE 100 UNIT/ML ~~LOC~~ SOLN: 20.0000 [IU] | Freq: Every day | SUBCUTANEOUS | 3 refills | Status: DC

## 2016-12-29 MED ORDER — LEVOTHYROXINE SODIUM 88 MCG PO TABS: 88.0000 ug | ORAL_TABLET | Freq: Every day | ORAL | 1 refills | Status: DC

## 2016-12-29 MED ORDER — INSULIN GLARGINE 100 UNIT/ML ~~LOC~~ SOLN: 12.0000 [IU] | Freq: Every day | SUBCUTANEOUS | 3 refills | Status: DC

## 2016-12-29 NOTE — Patient Instructions (Signed)
Type 1 Diabetes Mellitus, Diagnosis, Adult Type 1 diabetes (type 1 diabetes mellitus) is a long-term (chronic) disease. It occurs when the pancreas does not make enough of a hormone called insulin. Normally, insulin allows sugars (glucose) to enter cells in the body. The cells use glucose for energy. Lack of insulin causes excess glucose to build up in the blood instead of going into cells. As a result, high blood glucose (hyperglycemia) develops. The exact cause of type 1 diabetes is not known. There is currently no cure for type 1 diabetes, but it can be managed with insulin treatment and lifestyle changes. What increases the risk? You may be more likely to develop this condition if you have a family member who has type 1 diabetes. Other factors may also make you more likely to develop type 1 diabetes, such as:  Having a gene for type 1 diabetes that is passed along from parent to child (inherited).  Living in an area with cold weather conditions.  Exposure to certain viruses.  Certain conditions in which the body's disease-fighting (immune) system attacks itself (autoimmune disorders).  What are the signs or symptoms? Symptoms may develop gradually, over days or weeks, or they may develop suddenly. Symptoms may include:  Increased thirst (polydipsia).  Increased hunger(polyphagia).  Increased urination (polyuria).  Increased urination during the night (nocturia).  Sudden or unexplained weight changes.  Frequent infections that keep coming back (recurring).  Fatigue.  Weakness.  Vision changes, such as blurry vision.  Fruity-smelling breath.  Cuts or bruises that are slow to heal.  Tingling or numbness in the hands or feet.  How is this diagnosed?  This condition is diagnosed based on your symptoms, your medical history, a physical exam, and your blood glucose level. Your blood glucose may be checked with one or more of the following blood tests:  A fasting blood  glucose (FBG) test. You will not be allowed to eat (you will fast) for at least 8 hours before a blood sample is taken.  A random blood glucose test. This checks blood glucose at any time of day regardless of when you ate.  An A1c (hemoglobin A1c) blood test. This provides information about blood glucose control over the previous 2-3 months.  You may be diagnosed with type 1 diabetes if:  Your FBG level is 126 mg/dL (7.0 mmol/L) or higher.  Your random blood glucose level is 200 mg/dL (11.1 mmol/L) or higher.  Your A1c level is 6.5% or higher.  These blood tests may be repeated to confirm your diagnosis. How is this treated? Your treatment may be managed by a specialist called an endocrinologist. Type 1 diabetes can be managed by following instructions from your health care provider about:  Taking insulin daily. This helps to keep your blood glucose levels in the healthy range. ? You may need to adjust your insulin dosage based on how physically active you are and what foods you eat. Your health care provider will tell you how to do this.  Taking medicines to help prevent complications from diabetes, such as: ? Aspirin. ? Medicine to lower cholesterol. ? Medicine to control blood pressure.  Checking your blood glucose as often as directed.  Making diet and lifestyle changes. These may include: ? Following an individualized nutrition plan that is developed by a diet and nutrition specialist (registered dietitian). ? Exercising regularly. ? Finding ways to manage stress.  Your health care provider will set individualized treatment goals for you. Your goals will be based on   your age, other medical conditions you have, and how you respond to diabetes treatment. Generally, the goal of treatment is to maintain the following blood glucose levels:  Before meals (preprandial): 80-130 mg/dL (4.4-7.2 mmol/L).  After meals (postprandial): below 180 mg/dL (10 mmol/L).  A1c level: less than  7%.  Follow these instructions at home: Questions to Ask Your Health Care Provider   Consider asking the following questions: ? Do I need to meet with a diabetes educator? ? Should I consider joining a support group for people with diabetes? ? What equipment will I need to manage my diabetes at home? ? What diabetes medicines should I take, and when? ? How often should I check my blood glucose? ? What number should I call if I have questions? ? When is my next appointment? General instructions  Take over-the-counter and prescription medicines only as told by your health care provider.  Keep all follow-up visits as told by your health care provider. This is important.  For more information about diabetes, visit: ? American Diabetes Association (ADA): www.diabetes.org ? American Association of Diabetes Educators (AADE): www.diabeteseducator.org/patient-resources Contact a health care provider if:  Your blood glucose level is higher than 240 mg/dL (13.3 mmol/L) for 2 days in a row.  You have been sick or have had a fever for 2 days or more and you are not getting better.  You have any of the following problems for more than 6 hours: ? You cannot eat or drink. ? You have nausea and vomiting. ? You have diarrhea. Get help right away if:  Your blood glucose is below 54 mg/dL (3 mmol/L).  You become confused or you have trouble thinking clearly.  You have difficulty breathing.  You have moderate or large ketone levels in your urine. This information is not intended to replace advice given to you by your health care provider. Make sure you discuss any questions you have with your health care provider. Document Released: 05/20/2000 Document Revised: 10/29/2015 Document Reviewed: 06/26/2015 Elsevier Interactive Patient Education  2017 [employer]  

## 2016-12-29 NOTE — Progress Notes (Signed)
Subjective:  Patient ID: Logan [name], male    DOB: 02-26-1951  Age: 66 y.o. MRN: [id number]  CC: Hypothyroidism; Hypertension; and Diabetes   HPI Logan [name] presents for f/up - He complains of worsening numbness in both feet but otherwise feels well and offers no other complaints today.  Outpatient Medications Prior to Visit  Medication Sig Dispense Refill  . amLODipine (NORVASC) 10 MG tablet Take 1 tablet (10 mg total) by mouth daily. 90 tablet 3  . aspirin 81 MG tablet Take 81 mg by mouth 2 (two) times daily.     Marland Kitchen glucose blood (COOL BLOOD GLUCOSE TEST STRIPS) test strip Use to check blood sugar daily. 100 each 12  . losartan (COZAAR) 100 MG tablet Take 0.5 tablets (50 mg total) by mouth daily. 45 tablet 3  . rosuvastatin (CRESTOR) 10 MG tablet Take 1 tablet (10 mg total) by mouth daily. 90 tablet 3  . triamterene-hydrochlorothiazide (MAXZIDE-25) 37.5-25 MG tablet Take 0.5 tablets by mouth daily. 45 tablet 3  . insulin glargine (LANTUS) 100 UNIT/ML injection Inject 0.12 mLs (12 Units total) into the skin at bedtime. 30 mL 3  . levothyroxine (SYNTHROID, LEVOTHROID) 100 MCG tablet TAKE 1 TABLET BY MOUTH DAILY BEFORE BREAKFAST. 90 tablet 1  . NOVOLIN 70/30 (70-30) 100 UNIT/ML injection INJECT 15-17 UNITS INTO THE SKIN 2 (TWO) TIMES DAILY WITH A MEAL. TOTAL OF 32 UNITS PER DAY 30 mL 11   No facility-administered medications prior to visit.     ROS Review of Systems  Constitutional: Negative.  Negative for appetite change, diaphoresis, fatigue and unexpected weight change.  HENT: Negative.   Eyes: Negative for visual disturbance.  Respiratory: Negative.  Negative for cough, chest tightness, shortness of breath and wheezing.   Cardiovascular: Negative.  Negative for chest pain, palpitations and leg swelling.  Gastrointestinal: Negative for abdominal pain, constipation, diarrhea, nausea and vomiting.  Endocrine: Negative.  Negative for cold intolerance, heat intolerance,  polydipsia, polyphagia and polyuria.  Genitourinary: Negative.  Negative for decreased urine volume, difficulty urinating, dysuria, frequency and urgency.  Musculoskeletal: Negative.  Negative for myalgias and neck pain.  Skin: Negative.  Negative for color change.  Allergic/Immunologic: Negative.   Neurological: Positive for numbness. Negative for dizziness and weakness.  Hematological: Negative for adenopathy. Does not bruise/bleed easily.  Psychiatric/Behavioral: Negative.     Objective:  BP (!) 146/60 (BP Location: Left Arm, Patient Position: Sitting, Cuff Size: Normal)   Pulse 76   Temp (!) 97.5 F (36.4 C) (Oral)   Ht 6\' 2"  (1.88 m)   Wt 176 lb 8 oz (80.1 kg)   SpO2 100%   BMI 22.66 kg/m   BP Readings from Last 3 Encounters:  12/29/16 (!) 146/60  10/07/16 (!) 112/52  08/31/16 (!) 148/62    Wt Readings from Last 3 Encounters:  12/29/16 176 lb 8 oz (80.1 kg)  10/07/16 173 lb 9.6 oz (78.7 kg)  08/31/16 175 lb (79.4 kg)    Physical Exam  Constitutional: He is oriented to person, place, and time. No distress.  HENT:  Mouth/Throat: Oropharynx is clear and moist. No oropharyngeal exudate.  Eyes: Conjunctivae are normal. Right eye exhibits no discharge. Left eye exhibits no discharge. No scleral icterus.  Neck: Normal range of motion. Neck supple. No JVD present. No thyromegaly present.  Cardiovascular: Normal rate, regular rhythm and intact distal pulses.  Exam reveals no gallop and no friction rub.   No murmur heard. Pulmonary/Chest: Effort normal and breath sounds normal. No  respiratory distress. He has no wheezes. He has no rales. He exhibits no tenderness.  Abdominal: Soft. Bowel sounds are normal. He exhibits no distension and no mass. There is no tenderness. There is no rebound and no guarding.  Musculoskeletal: Normal range of motion. He exhibits no edema, tenderness or deformity.  Lymphadenopathy:    He has no cervical adenopathy.  Neurological: He is alert and  oriented to person, place, and time.  Skin: Skin is warm and dry. No rash noted. He is not diaphoretic. No erythema. No pallor.  Vitals reviewed.   Lab Results  Component Value Date   WBC 6.8 12/29/2016   HGB 15.5 12/29/2016   HCT 45.3 12/29/2016   PLT 262.0 12/29/2016   GLUCOSE 65 (L) 12/29/2016   CHOL 157 03/29/2016   TRIG 124.0 03/29/2016   HDL 42.70 03/29/2016   LDLCALC 90 03/29/2016   ALT 30 03/29/2016   AST 22 03/29/2016   NA 141 12/29/2016   K 3.8 12/29/2016   CL 102 12/29/2016   CREATININE 1.30 12/29/2016   BUN 15 12/29/2016   CO2 32 12/29/2016   TSH 0.15 (L) 12/29/2016   PSA 2.20 12/29/2016   HGBA1C 8.8 (H) 12/29/2016   MICROALBUR 1.8 12/29/2016    Ct Temporal Bones W/o Cm  Result Date: 07/18/2014 CLINICAL DATA:  BILATERAL chronic otitis media with severe hearing loss. EXAM: CT TEMPORAL BONES WITHOUT CONTRAST TECHNIQUE: Axial and coronal plane CT imaging of the petrous temporal bones was performed with thin-collimation image reconstruction. No intravenous contrast was administered. Multiplanar CT image reconstructions were also generated. COMPARISON:  None. FINDINGS: The RIGHT tympanic membrane is silhouetted by fluid and/or soft tissue in the RIGHT middle ear and fluid/soft tissue in the bony external canal, lateral to the ear drum. The osseous margins of the bony external canal appears slightly irregular, although the scutum is intact. There is marked disruption of the ossicular chain on the RIGHT. No recognizable malleo-incudal joint. Probable displacement of the stapes away from the oval window. There is soft tissue in the attic and thinning of the tegmen tympani. There is widening of the aditus ad antrum, coalescence of the mastoid air cells, and thinning or dehiscence of the tegmen mastoideum, all consistent with chronic and widespread cholesteatoma. RIGHT inner ear structures intact without take down of the bony margins of the semicircular canals. On the LEFT, there is  similar but less severe disease. The external canal is unremarkable. LEFT TM is retracted. Anatomic ossicular anatomy is observed without erosion or displacement of the malleus, incus, or stapes. Within the LEFT middle ear cavity, there is soft tissue/fluid inferiorly, but the attic remains aerated with normal-appearing tegmen tympani. Slight widening of the LEFT aditus ad antrum and coalescence of LEFT mastoid air cells but intact tegmen mastoideum. Normal LEFT inner ear structures. Unremarkable appearing noncontrast intracranial compartment. Normal visualized TMJs and extracranial soft tissues. No sinus air-fluid level is evident. IMPRESSION: Severe BILATERAL chronic otitis media with BILATERAL middle ear and mastoid cholesteatomas, RIGHT much worse than LEFT. 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 Similar/less severe changes on the LEFT. Dehiscence of the tegmen tympani and tegmen mastoideum on the RIGHT is suspected. Within limits of visualization on noncontrast exam, no visible intracranial complication. Electronically Signed   By: Rolla Flatten M.D.   On: 07/18/2014 15:34    Assessment & Plan:   Michae was seen today for hypothyroidism, hypertension and diabetes.  Diagnoses and all orders for  this visit:  Essential hypertension- his blood pressure is well-controlled, electrolytes and renal function are normal. -     Basic metabolic panel; Future -     CBC with Differential/Platelet; Future -     Urinalysis, Routine w reflex microscopic; Future  Benign prostatic hyperplasia without lower urinary tract symptoms- he has no symptoms related to this that need to be treated. 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 -     insulin glargine (LANTUS) 100 UNIT/ML injection; Inject 0.2 mLs (20 Units total) into the skin at bedtime. 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 -     Hemoglobin A1c; Future -     Discontinue: insulin glargine (LANTUS) 100 UNIT/ML injection; Inject 0.12 mLs (12 Units total) into the skin at bedtime. -     insulin NPH-regular Human (NOVOLIN 70/30) (70-30) 100 UNIT/ML injection; Inject 20 Units into the skin 2 (two) times daily with a meal. -     insulin glargine (LANTUS) 100 UNIT/ML injection; Inject 0.2 mLs (20 Units total) into the skin at bedtime. -     Amb Referral to Nutrition and Diabetic E  Hypothyroidism, unspecified type- his TSH is suppressed so I have decreased his levothyroxine dose. -     TSH; Future -     levothyroxine (SYNTHROID, LEVOTHROID) 88 MCG tablet; Take 1 tablet (88 mcg total) by mouth daily.   I have discontinued Logan [name]'s insulin glargine and levothyroxine. I have changed his NOVOLIN 70/30 to insulin NPH-regular Human. I have also changed his insulin glargine. Additionally, I am having him start on levothyroxine. Lastly, I am having him maintain his aspirin, rosuvastatin, triamterene-hydrochlorothiazide, glucose blood, losartan, and amLODipine.  Meds ordered this encounter  Medications  . DISCONTD: insulin glargine (LANTUS) 100 UNIT/ML injection    Sig: Inject 0.12 mLs (12 Units total) into the skin at bedtime.    Dispense:  30 mL    Refill:  3  . levothyroxine (SYNTHROID, LEVOTHROID) 88 MCG tablet    Sig: Take 1 tablet (88 mcg total) by mouth daily.     Dispense:  90 tablet    Refill:  1  . insulin NPH-regular Human (NOVOLIN 70/30) (70-30) 100 UNIT/ML injection    Sig: Inject 20 Units into the skin 2 (two) times daily with a meal.    Dispense:  30 mL    Refill:  11  . insulin glargine (LANTUS) 100 UNIT/ML injection    Sig: Inject 0.2 mLs (20 Units total) into the skin at bedtime.    Dispense:  30 mL    Refill:  3     Follow-up: Return in about 4 months (around 05/01/2017).  Scarlette Calico, MD

## 2017-01-04 ENCOUNTER — Telehealth: Payer: Self-pay | Admitting: Internal Medicine

## 2017-01-04 NOTE — Telephone Encounter (Signed)
Spoke with pt regarding awv. AWV was scheduled too early. Informed pt that AWV needs to be scheduled in Oct 2018. Pt stated that he would like to cancel appt scheduled for 01/05/2017 and that he will call office to schedule appt in Oct.

## 2017-01-05 ENCOUNTER — Ambulatory Visit: Payer: Medicare Other

## 2017-04-10 DIAGNOSIS — Z23 Encounter for immunization: Secondary | ICD-10-CM | POA: Diagnosis not present

## 2017-05-12 DIAGNOSIS — R972 Elevated prostate specific antigen [PSA]: Secondary | ICD-10-CM | POA: Diagnosis not present

## 2017-05-12 LAB — PSA: PSA: 2.18

## 2017-05-19 DIAGNOSIS — R972 Elevated prostate specific antigen [PSA]: Secondary | ICD-10-CM | POA: Diagnosis not present

## 2017-06-15 ENCOUNTER — Ambulatory Visit (INDEPENDENT_AMBULATORY_CARE_PROVIDER_SITE_OTHER): Payer: Medicare Other | Admitting: Internal Medicine

## 2017-06-15 ENCOUNTER — Encounter: Payer: Self-pay | Admitting: Internal Medicine

## 2017-06-15 VITALS — BP 138/62 | HR 71 | Ht 74.0 in | Wt 175.0 lb

## 2017-06-15 DIAGNOSIS — E782 Mixed hyperlipidemia: Secondary | ICD-10-CM | POA: Diagnosis not present

## 2017-06-15 DIAGNOSIS — I10 Essential (primary) hypertension: Secondary | ICD-10-CM | POA: Diagnosis not present

## 2017-06-15 DIAGNOSIS — I2510 Atherosclerotic heart disease of native coronary artery without angina pectoris: Secondary | ICD-10-CM

## 2017-06-15 DIAGNOSIS — R7989 Other specified abnormal findings of blood chemistry: Secondary | ICD-10-CM | POA: Diagnosis not present

## 2017-06-15 NOTE — Progress Notes (Signed)
Cardiology Office Note   Date:  06/15/2017   ID:  Logan [name], DOB [birth year], MRN [id number]  PCP:  Logan Lima, MD  Cardiologist:   Dorris Carnes, MD    F?U of CAD and HTN     History of Present Illness: Logan [name] is a 67 y.o. male with a history of history of CAD. 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 He had nonobstructive disease at catheterization in 2004. He had a negative Myoview scan in 2009 with an ejection fraction of 49%. Pt also had syncope in past Known bradycardia.  I saw the pt in May 2018  Since seen the pt says he fels good  Hunting a lot  No SOB   No CP   Very active      Current Meds  Medication Sig  . amLODipine (NORVASC) 10 MG tablet Take 1 tablet (10 mg total) by mouth daily.  Marland Kitchen aspirin 81 MG tablet Take 81 mg by mouth 2 (two) times daily.   Marland Kitchen glucose blood (COOL BLOOD GLUCOSE TEST STRIPS) test strip Use to check blood sugar daily.  . insulin glargine (LANTUS) 100 UNIT/ML injection Inject 0.2 mLs (20 Units total) into the skin at bedtime.  . insulin NPH-regular Human (NOVOLIN 70/30) (70-30) 100 UNIT/ML injection Inject 20 Units into the skin 2 (two) times daily with a meal.  . levothyroxine (SYNTHROID, LEVOTHROID) 88 MCG tablet Take 1 tablet (88 mcg total) by mouth daily.  Marland Kitchen losartan (COZAAR) 100 MG tablet Take 0.5 tablets (50 mg total) by mouth daily.  . rosuvastatin (CRESTOR) 10 MG tablet Take 1 tablet (10 mg total) by mouth daily.  Marland Kitchen triamterene-hydrochlorothiazide (MAXZIDE-25) 37.5-25 MG tablet Take 0.5 tablets by mouth daily.     Allergies:   Patient has no known allergies.   Past Medical History:  Diagnosis Date  . Coronary artery disease involving native coronary artery of native heart without angina pectoris 11/01/2007   Last Cath '04: LAD w/ 60% long lesion; 1st Dx 70% ostial; 2nd Dx  70% ostial; Cx 30% ostial; RCA-dominant multiple 30-40% lesions; PDA 30% lesions  Last nuclear stress March '09 - negative for ischemia     . Diabetes mellitus   . Diabetic retinopathy   . Hearing loss   . Hemochromatosis   . Hyperlipidemia   . Hypertension     Past Surgical History:  Procedure Laterality Date  . 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 INNER EAR SURGERY     right     Social History:  The patient  reports that  has never smoked. he has never used smokeless tobacco. He reports that he drinks alcohol. He reports that he does not use drugs.   Family History:  The patient's family history includes Alcohol abuse in his father; Asthma in his mother; Cancer in his father; Colon cancer in his maternal uncle; Emphysema in his mother; Heart failure in his mother; Stroke in his father.    ROS:  Please see the history of present illness. All other systems are reviewed and  Negative to the above problem except as noted.    PHYSICAL EXAM: VS:  BP 138/62   Pulse 71   Ht 6\' 2"  (1.88 m)   Wt 175 lb (79.4 kg)   SpO2 95%   BMI  22.47 kg/m   GEN: Well nourished, well developed, in no acute distress  HEENT: normal  Neck: no JVD, carotid bruits, or masses Cardiac: RRR; no murmurs, rubs, or gallops,no edema  Respiratory:  clear to auscultation bilaterally, normal work of breathing GI: soft, nontender, nondistended, + BS  No hepatomegaly  MS: no deformity Moving all extremities   Skin: warm and dry, no rash Neuro:  Strength and sensation are intact Psych: euthymic mood, full affect   EKG:  EKG is not ordered today.   Lipid Panel    Component Value Date/Time   CHOL 157 03/29/2016 1404   TRIG 124.0 03/29/2016 1404   HDL 42.70 03/29/2016 1404   CHOLHDL 4 03/29/2016 1404   VLDL 24.8 03/29/2016 1404   LDLCALC 90 03/29/2016 1404      Wt Readings from Last 3 Encounters:  06/15/17 175 lb (79.4 kg)  12/29/16 176 lb 8 oz (80.1 kg)  10/07/16 173 lb 9.6 oz (78.7  kg)      ASSESSMENT AND PLAN: 1 Blood pressure  BP is fair  It sounds like it goes low at times when warm  Keep on same meds  2  CAD  No symtoms of angina  Active  Continue to follow    3  HL  Keep on Crestor  Labs next week   2  Bradycardia   HR OK  Continue to follow    3  Thyroid  WIll get labs     F/u in 1 year   Labs to be sent from Dr Ronnald Ramp office    Current medicines are reviewed at length with the patient today.  The patient does not have concerns regarding medicines.  Signed, Dorris Carnes, MD  06/15/2017 10:17 AM    [address], [place], [place]  [address] Phone: [phone]; Fax: [phone]

## 2017-06-15 NOTE — Patient Instructions (Addendum)
Your physician recommends that you continue on your current medications as directed. Please refer to the Current Medication list given to you today. Your physician wants you to follow-up in: 1 year with Dr. Ross.  You will receive a reminder letter in the mail two months in advance. If you don't receive a letter, please call our office to schedule the follow-up appointment.  

## 2017-06-19 ENCOUNTER — Ambulatory Visit: Payer: Medicare Other | Admitting: Internal Medicine

## 2017-06-20 ENCOUNTER — Other Ambulatory Visit (INDEPENDENT_AMBULATORY_CARE_PROVIDER_SITE_OTHER): Payer: Medicare Other

## 2017-06-20 ENCOUNTER — Encounter: Payer: Self-pay | Admitting: Internal Medicine

## 2017-06-20 ENCOUNTER — Ambulatory Visit (INDEPENDENT_AMBULATORY_CARE_PROVIDER_SITE_OTHER): Payer: Medicare Other | Admitting: Internal Medicine

## 2017-06-20 ENCOUNTER — Telehealth: Payer: Self-pay

## 2017-06-20 VITALS — BP 138/70 | HR 85 | Temp 97.6°F | Resp 16 | Ht 74.0 in | Wt 174.0 lb

## 2017-06-20 DIAGNOSIS — E1051 Type 1 diabetes mellitus with diabetic peripheral angiopathy without gangrene: Secondary | ICD-10-CM | POA: Diagnosis not present

## 2017-06-20 DIAGNOSIS — E039 Hypothyroidism, unspecified: Secondary | ICD-10-CM

## 2017-06-20 DIAGNOSIS — E10319 Type 1 diabetes mellitus with unspecified diabetic retinopathy without macular edema: Secondary | ICD-10-CM

## 2017-06-20 DIAGNOSIS — E1065 Type 1 diabetes mellitus with hyperglycemia: Secondary | ICD-10-CM

## 2017-06-20 DIAGNOSIS — I70209 Unspecified atherosclerosis of native arteries of extremities, unspecified extremity: Secondary | ICD-10-CM | POA: Diagnosis not present

## 2017-06-20 DIAGNOSIS — IMO0002 Reserved for concepts with insufficient information to code with codable children: Secondary | ICD-10-CM

## 2017-06-20 DIAGNOSIS — E785 Hyperlipidemia, unspecified: Secondary | ICD-10-CM

## 2017-06-20 DIAGNOSIS — I10 Essential (primary) hypertension: Secondary | ICD-10-CM | POA: Diagnosis not present

## 2017-06-20 DIAGNOSIS — I2510 Atherosclerotic heart disease of native coronary artery without angina pectoris: Secondary | ICD-10-CM

## 2017-06-20 DIAGNOSIS — Z23 Encounter for immunization: Secondary | ICD-10-CM

## 2017-06-20 LAB — BASIC METABOLIC PANEL
BUN: 26 mg/dL — AB (ref 6–23)
CALCIUM: 9.4 mg/dL (ref 8.4–10.5)
CO2: 31 meq/L (ref 19–32)
CREATININE: 1.22 mg/dL (ref 0.40–1.50)
Chloride: 98 mEq/L (ref 96–112)
GFR: 63.09 mL/min (ref >60.00)
Glucose, Bld: 81 mg/dL (ref 70–99)
Potassium: 3.4 mEq/L — ABNORMAL LOW (ref 3.5–5.1)
Sodium: 140 mEq/L (ref 135–145)

## 2017-06-20 LAB — TSH: TSH: 3.68 u[IU]/mL (ref 0.35–4.50)

## 2017-06-20 LAB — LIPID PANEL
CHOL/HDL RATIO: 3
Cholesterol: 139 mg/dL (ref 0–200)
HDL: 44.3 mg/dL (ref >39.00)
LDL Cholesterol: 75 mg/dL (ref 0–99)
NONHDL: 94.4
Triglycerides: 99 mg/dL (ref 0.0–149.0)
VLDL: 19.8 mg/dL (ref 0.0–40.0)

## 2017-06-20 LAB — HEMOGLOBIN A1C: Hgb A1c MFr Bld: 10.3 % — ABNORMAL HIGH (ref 4.6–6.5)

## 2017-06-20 NOTE — Telephone Encounter (Signed)
Need refills on all rx's. Waiting lab results.

## 2017-06-20 NOTE — Patient Instructions (Signed)
Type 1 Diabetes Mellitus, Diagnosis, Adult Type 1 diabetes (type 1 diabetes mellitus) is a long-term (chronic) disease. It occurs when the pancreas does not make enough of a hormone called insulin. Normally, insulin allows sugars (glucose) to enter cells in the body. The cells use glucose for energy. Lack of insulin causes excess glucose to build up in the blood instead of going into cells. As a result, high blood glucose (hyperglycemia) develops. The exact cause of type 1 diabetes is not known. There is currently no cure for type 1 diabetes, but it can be managed with insulin treatment and lifestyle changes. What increases the risk? You may be more likely to develop this condition if you have a family member who has type 1 diabetes. Other factors may also make you more likely to develop type 1 diabetes, such as:  Having a gene for type 1 diabetes that is passed along from parent to child (inherited).  Living in an area with cold weather conditions.  Exposure to certain viruses.  Certain conditions in which the body's disease-fighting (immune) system attacks itself (autoimmune disorders).  What are the signs or symptoms? Symptoms may develop gradually, over days or weeks, or they may develop suddenly. Symptoms may include:  Increased thirst (polydipsia).  Increased hunger(polyphagia).  Increased urination (polyuria).  Increased urination during the night (nocturia).  Sudden or unexplained weight changes.  Frequent infections that keep coming back (recurring).  Fatigue.  Weakness.  Vision changes, such as blurry vision.  Fruity-smelling breath.  Cuts or bruises that are slow to heal.  Tingling or numbness in the hands or feet.  How is this diagnosed?  This condition is diagnosed based on your symptoms, your medical history, a physical exam, and your blood glucose level. Your blood glucose may be checked with one or more of the following blood tests:  A fasting blood  glucose (FBG) test. You will not be allowed to eat (you will fast) for at least 8 hours before a blood sample is taken.  A random blood glucose test. This checks blood glucose at any time of day regardless of when you ate.  An A1c (hemoglobin A1c) blood test. This provides information about blood glucose control over the previous 2-3 months.  You may be diagnosed with type 1 diabetes if:  Your FBG level is 126 mg/dL (7.0 mmol/L) or higher.  Your random blood glucose level is 200 mg/dL (11.1 mmol/L) or higher.  Your A1c level is 6.5% or higher.  These blood tests may be repeated to confirm your diagnosis. How is this treated? Your treatment may be managed by a specialist called an endocrinologist. Type 1 diabetes can be managed by following instructions from your health care provider about:  Taking insulin daily. This helps to keep your blood glucose levels in the healthy range. ? You may need to adjust your insulin dosage based on how physically active you are and what foods you eat. Your health care provider will tell you how to do this.  Taking medicines to help prevent complications from diabetes, such as: ? Aspirin. ? Medicine to lower cholesterol. ? Medicine to control blood pressure.  Checking your blood glucose as often as directed.  Making diet and lifestyle changes. These may include: ? Following an individualized nutrition plan that is developed by a diet and nutrition specialist (registered dietitian). ? Exercising regularly. ? Finding ways to manage stress.  Your health care provider will set individualized treatment goals for you. Your goals will be based on   your age, other medical conditions you have, and how you respond to diabetes treatment. Generally, the goal of treatment is to maintain the following blood glucose levels:  Before meals (preprandial): 80-130 mg/dL (4.4-7.2 mmol/L).  After meals (postprandial): below 180 mg/dL (10 mmol/L).  A1c level: less than  7%.  Follow these instructions at home: Questions to [facility] Provider   Consider asking the following questions: ? Do I need to meet with a diabetes educator? ? Should I consider joining a support group for people with diabetes? ? What equipment will I need to manage my diabetes at home? ? What diabetes medicines should I take, and when? ? How often should I check my blood glucose? ? What number should I call if I have questions? ? When is my next appointment? General instructions  Take over-the-counter and prescription medicines only as told by your health care provider.  Keep all follow-up visits as told by your health care provider. This is important.  For more information about diabetes, visit: ? American Diabetes Association (ADA): www.diabetes.org ? American Association of Diabetes Educators (AADE): www.diabeteseducator.org/patient-resources Contact a health care provider if:  Your blood glucose level is higher than 240 mg/dL (13.3 mmol/L) for 2 days in a row.  You have been sick or have had a fever for 2 days or more and you are not getting better.  You have any of the following problems for more than 6 hours: ? You cannot eat or drink. ? You have nausea and vomiting. ? You have diarrhea. Get help right away if:  Your blood glucose is below 54 mg/dL (3 mmol/L).  You become confused or you have trouble thinking clearly.  You have difficulty breathing.  You have moderate or large ketone levels in your urine. This information is not intended to replace advice given to you by your health care provider. Make sure you discuss any questions you have with your health care provider. Document Released: 05/20/2000 Document Revised: 10/29/2015 Document Reviewed: 06/26/2015 Elsevier Interactive Patient Education  [employer].

## 2017-06-20 NOTE — Progress Notes (Signed)
Subjective:  Patient ID: Logan [name], male    DOB: 1951/05/12  Age: 67 y.o. MRN: [id number]  CC: Hypothyroidism; Hypertension; Diabetes; and Coronary Artery Disease   HPI Logan [name] presents for f/u - He feels well today and offers no complaints.  He does not know if his blood sugars are well controlled because he does not check.  He denies any recent episodes of poly's.  He denies abdominal pain, fatigue, constipation, or edema.  Outpatient Medications Prior to Visit  Medication Sig Dispense Refill  . amLODipine (NORVASC) 10 MG tablet Take 1 tablet (10 mg total) by mouth daily. 90 tablet 3  . aspirin 81 MG tablet Take 81 mg by mouth 2 (two) times daily.     Marland Kitchen glucose blood (COOL BLOOD GLUCOSE TEST STRIPS) test strip Use to check blood sugar daily. 100 each 12  . insulin glargine (LANTUS) 100 UNIT/ML injection Inject 0.2 mLs (20 Units total) into the skin at bedtime. 30 mL 3  . insulin NPH-regular Human (NOVOLIN 70/30) (70-30) 100 UNIT/ML injection Inject 20 Units into the skin 2 (two) times daily with a meal. 30 mL 11  . losartan (COZAAR) 100 MG tablet Take 0.5 tablets (50 mg total) by mouth daily. 45 tablet 3  . levothyroxine (SYNTHROID, LEVOTHROID) 88 MCG tablet Take 1 tablet (88 mcg total) by mouth daily. 90 tablet 1  . rosuvastatin (CRESTOR) 10 MG tablet Take 1 tablet (10 mg total) by mouth daily. 90 tablet 3  . triamterene-hydrochlorothiazide (MAXZIDE-25) 37.5-25 MG tablet Take 0.5 tablets by mouth daily. 45 tablet 3   No facility-administered medications prior to visit.     ROS Review of Systems  Constitutional: Negative for appetite change, diaphoresis, fatigue and unexpected weight change.  HENT: Negative.  Negative for trouble swallowing.   Eyes: Negative for visual disturbance.  Respiratory: Negative for cough, chest tightness, shortness of breath and wheezing.   Cardiovascular: Negative for chest pain, palpitations and leg swelling.  Gastrointestinal: Negative for  abdominal pain, constipation, diarrhea, nausea and vomiting.  Endocrine: Negative for cold intolerance, heat intolerance, polydipsia, polyphagia and polyuria.  Genitourinary: Negative.  Negative for difficulty urinating.  Musculoskeletal: Negative.  Negative for myalgias.  Skin: Negative.  Negative for color change.  Neurological: Negative.  Negative for weakness and light-headedness.  Hematological: Negative.  Does not bruise/bleed easily.  Psychiatric/Behavioral: Negative.     Objective:  BP 138/70 (BP Location: Left Arm, Patient Position: Sitting, Cuff Size: Normal)   Pulse 85   Temp 97.6 F (36.4 C) (Oral)   Resp 16   Ht 6\' 2"  (1.88 m)   Wt 174 lb (78.9 kg)   SpO2 96%   BMI 22.34 kg/m   BP Readings from Last 3 Encounters:  06/20/17 138/70  06/15/17 138/62  12/29/16 (!) 146/60    Wt Readings from Last 3 Encounters:  06/20/17 174 lb (78.9 kg)  06/15/17 175 lb (79.4 kg)  12/29/16 176 lb 8 oz (80.1 kg)    Physical Exam  Constitutional: He is oriented to person, place, and time. No distress.  HENT:  Mouth/Throat: Oropharynx is clear and moist. No oropharyngeal exudate.  Eyes: Conjunctivae are normal. Left eye exhibits no discharge. No scleral icterus.  Neck: Normal range of motion. Neck supple. No JVD present. No thyromegaly present.  Cardiovascular: Normal rate, regular rhythm and normal heart sounds. Exam reveals no gallop.  No murmur heard. Pulmonary/Chest: Effort normal and breath sounds normal. No respiratory distress. He has no wheezes. He has no  rales.  Abdominal: Soft. Bowel sounds are normal. He exhibits no distension and no mass.  Musculoskeletal: Normal range of motion. He exhibits no edema, tenderness or deformity.  Lymphadenopathy:    He has no cervical adenopathy.  Neurological: He is alert and oriented to person, place, and time.  Skin: Skin is warm and dry. No rash noted. He is not diaphoretic. No erythema. No pallor.  Vitals reviewed.   Lab Results    Component Value Date   WBC 6.8 12/29/2016   HGB 15.5 12/29/2016   HCT 45.3 12/29/2016   PLT 262.0 12/29/2016   GLUCOSE 81 06/20/2017   CHOL 139 06/20/2017   TRIG 99.0 06/20/2017   HDL 44.30 06/20/2017   LDLCALC 75 06/20/2017   ALT 30 03/29/2016   AST 22 03/29/2016   NA 140 06/20/2017   K 3.4 (L) 06/20/2017   CL 98 06/20/2017   CREATININE 1.22 06/20/2017   BUN 26 (H) 06/20/2017   CO2 31 06/20/2017   TSH 3.68 06/20/2017   PSA 2.18 05/12/2017   HGBA1C 10.3 (H) 06/20/2017   MICROALBUR 1.8 12/29/2016    Ct Temporal Bones W/o Cm  Result Date: 07/18/2014 CLINICAL DATA:  BILATERAL chronic otitis media with severe hearing loss. EXAM: CT TEMPORAL BONES WITHOUT CONTRAST TECHNIQUE: Axial and coronal plane CT imaging of the petrous temporal bones was performed with thin-collimation image reconstruction. No intravenous contrast was administered. Multiplanar CT image reconstructions were also generated. COMPARISON:  None. FINDINGS: The RIGHT tympanic membrane is silhouetted by fluid and/or soft tissue in the RIGHT middle ear and fluid/soft tissue in the bony external canal, lateral to the ear drum. The osseous margins of the bony external canal appears slightly irregular, although the scutum is intact. There is marked disruption of the ossicular chain on the RIGHT. No recognizable malleo-incudal joint. Probable displacement of the stapes away from the oval window. There is soft tissue in the attic and thinning of the tegmen tympani. There is widening of the aditus ad antrum, coalescence of the mastoid air cells, and thinning or dehiscence of the tegmen mastoideum, all consistent with chronic and widespread cholesteatoma. RIGHT inner ear structures intact without take down of the bony margins of the semicircular canals. On the LEFT, there is similar but less severe disease. The external canal is unremarkable. LEFT TM is retracted. Anatomic ossicular anatomy is observed without erosion or displacement of  the malleus, incus, or stapes. Within the LEFT middle ear cavity, there is soft tissue/fluid inferiorly, but the attic remains aerated with normal-appearing tegmen tympani. Slight widening of the LEFT aditus ad antrum and coalescence of LEFT mastoid air cells but intact tegmen mastoideum. Normal LEFT inner ear structures. Unremarkable appearing noncontrast intracranial compartment. Normal visualized TMJs and extracranial soft tissues. No sinus air-fluid level is evident. IMPRESSION: Severe BILATERAL chronic otitis media with BILATERAL middle ear and mastoid cholesteatomas, RIGHT much worse than LEFT. 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 Similar/less severe changes on the LEFT. Dehiscence of the tegmen tympani and tegmen mastoideum on the RIGHT is suspected. Within limits of visualization on noncontrast exam, no visible intracranial complication. Electronically Signed   By: Rolla Flatten M.D.   On: 07/18/2014 15:34    Assessment & Plan:   Divonte was seen today for hypothyroidism, hypertension, diabetes and coronary artery disease.  Diagnoses and all orders for this visit:  Type 1 diabetes, uncontrolled, with retinopathy ([place])- Hir A1c is at 10.3%.  His blood sugars are  not well controlled.  I have asked him to see endocrinology to gain better control of his blood sugars. -     Basic metabolic panel; Future -     Hemoglobin A1c; Future -     Cancel: Ambulatory referral to Endocrinology -     Ambulatory referral to Endocrinology  Acquired hypothyroidism- His TSH is in the normal range.  He will remain on the current dose of levothyroxine. 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  Will continue aggressive risk factor modification. -     Lipid panel; Future  Hyperlipidemia LDL goal <70- He has achieved his LDL goal and is doing well on the statin. -     Lipid panel; Future  Need for pneumococcal vaccination -     Pneumococcal conjugate vaccine 13-valent  Essential hypertension- His blood pressure is adequately well controlled.  He has mild hypokalemia.  Will continue triamterene to stabilize his potassium level.  Will also continue the ARB and thiazide diuretic.   I am having Airon L. Hughston maintain his aspirin, glucose blood, losartan, amLODipine, insulin NPH-regular Human, and insulin glargine.  No orders of the defined types were placed in this encounter.    Follow-up: Return in about 4 months (around 10/18/2017).  Scarlette Calico, MD

## 2017-06-22 MED ORDER — LEVOTHYROXINE SODIUM 88 MCG PO TABS: 88.0000 ug | ORAL_TABLET | Freq: Every day | ORAL | 1 refills | Status: DC

## 2017-06-22 NOTE — Telephone Encounter (Signed)
Levothyroxine rx sent to [facility].

## 2017-06-23 ENCOUNTER — Telehealth: Payer: Self-pay | Admitting: Internal Medicine

## 2017-06-23 ENCOUNTER — Other Ambulatory Visit: Payer: Self-pay | Admitting: Internal Medicine

## 2017-06-23 DIAGNOSIS — I70209 Unspecified atherosclerosis of native arteries of extremities, unspecified extremity: Secondary | ICD-10-CM

## 2017-06-23 DIAGNOSIS — E039 Hypothyroidism, unspecified: Secondary | ICD-10-CM

## 2017-06-23 DIAGNOSIS — E1051 Type 1 diabetes mellitus with diabetic peripheral angiopathy without gangrene: Secondary | ICD-10-CM

## 2017-06-23 DIAGNOSIS — I10 Essential (primary) hypertension: Secondary | ICD-10-CM

## 2017-06-23 NOTE — Telephone Encounter (Signed)
LVM for pt to call back as soon as possible.   RE: pt is requesting that we send in medication that cardiology is prescribing. Not able to send in the BP or cholesterol medication. The levothyroxine has been sent in already.

## 2017-06-23 NOTE — Telephone Encounter (Signed)
Patient came into office today.  States amlodipine, levothyroxine, losartan, rosuvastatin and hydrochlorothiazide should have been sent to [facility] on [address] [place].  Please follow up with patient in regard.

## 2017-06-28 ENCOUNTER — Telehealth: Payer: Self-pay | Admitting: Internal Medicine

## 2017-06-28 NOTE — Telephone Encounter (Signed)
Walk in pt form-Dr.Jones would not fill meds. Please call placed in Dr.Ross doc box.

## 2017-07-02 ENCOUNTER — Other Ambulatory Visit: Payer: Self-pay | Admitting: Internal Medicine

## 2017-07-02 DIAGNOSIS — I70209 Unspecified atherosclerosis of native arteries of extremities, unspecified extremity: Secondary | ICD-10-CM

## 2017-07-02 DIAGNOSIS — E1051 Type 1 diabetes mellitus with diabetic peripheral angiopathy without gangrene: Secondary | ICD-10-CM

## 2017-07-02 DIAGNOSIS — E10319 Type 1 diabetes mellitus with unspecified diabetic retinopathy without macular edema: Secondary | ICD-10-CM

## 2017-07-02 DIAGNOSIS — E1065 Type 1 diabetes mellitus with hyperglycemia: Secondary | ICD-10-CM

## 2017-07-02 DIAGNOSIS — I10 Essential (primary) hypertension: Secondary | ICD-10-CM

## 2017-07-02 DIAGNOSIS — IMO0002 Reserved for concepts with insufficient information to code with codable children: Secondary | ICD-10-CM

## 2017-07-02 MED ORDER — LOSARTAN POTASSIUM 100 MG PO TABS: 50.0000 mg | ORAL_TABLET | Freq: Every day | ORAL | 1 refills | Status: DC

## 2017-07-21 ENCOUNTER — Other Ambulatory Visit: Payer: Self-pay | Admitting: Internal Medicine

## 2017-07-21 ENCOUNTER — Telehealth: Payer: Self-pay

## 2017-07-21 DIAGNOSIS — I10 Essential (primary) hypertension: Secondary | ICD-10-CM

## 2017-07-21 MED ORDER — AMLODIPINE BESYLATE 10 MG PO TABS: 10.0000 mg | ORAL_TABLET | Freq: Every day | ORAL | 3 refills | Status: DC

## 2017-07-21 MED ORDER — AMLODIPINE BESYLATE 10 MG PO TABS: 10.0000 mg | ORAL_TABLET | Freq: Every day | ORAL | 1 refills | Status: DC

## 2017-07-21 NOTE — Telephone Encounter (Signed)
Pt came in and needs a refill of her AMLODIPINE, Please advise

## 2017-07-21 NOTE — Telephone Encounter (Signed)
Refill sent to pharm

## 2017-08-30 LAB — HEMOGLOBIN A1C: HEMOGLOBIN A1C: 9.7

## 2017-09-01 DIAGNOSIS — E7800 Pure hypercholesterolemia, unspecified: Secondary | ICD-10-CM | POA: Diagnosis not present

## 2017-09-01 DIAGNOSIS — I10 Essential (primary) hypertension: Secondary | ICD-10-CM | POA: Diagnosis not present

## 2017-09-01 DIAGNOSIS — E11319 Type 2 diabetes mellitus with unspecified diabetic retinopathy without macular edema: Secondary | ICD-10-CM | POA: Diagnosis not present

## 2017-09-01 DIAGNOSIS — I2510 Atherosclerotic heart disease of native coronary artery without angina pectoris: Secondary | ICD-10-CM | POA: Diagnosis not present

## 2017-09-01 DIAGNOSIS — E109 Type 1 diabetes mellitus without complications: Secondary | ICD-10-CM | POA: Diagnosis not present

## 2017-09-01 DIAGNOSIS — E039 Hypothyroidism, unspecified: Secondary | ICD-10-CM | POA: Diagnosis not present

## 2017-09-01 DIAGNOSIS — E1140 Type 2 diabetes mellitus with diabetic neuropathy, unspecified: Secondary | ICD-10-CM | POA: Diagnosis not present

## 2017-09-18 ENCOUNTER — Encounter: Payer: Self-pay | Admitting: Internal Medicine

## 2017-09-25 ENCOUNTER — Other Ambulatory Visit: Payer: Self-pay | Admitting: Internal Medicine

## 2017-09-25 DIAGNOSIS — E1065 Type 1 diabetes mellitus with hyperglycemia: Principal | ICD-10-CM

## 2017-09-25 DIAGNOSIS — IMO0002 Reserved for concepts with insufficient information to code with codable children: Secondary | ICD-10-CM

## 2017-09-25 DIAGNOSIS — E10319 Type 1 diabetes mellitus with unspecified diabetic retinopathy without macular edema: Secondary | ICD-10-CM

## 2017-09-25 MED ORDER — GLUCOSE BLOOD VI STRP: ORAL_STRIP | 1 refills | Status: DC

## 2017-10-02 DIAGNOSIS — H5213 Myopia, bilateral: Secondary | ICD-10-CM | POA: Diagnosis not present

## 2017-10-02 DIAGNOSIS — Z9889 Other specified postprocedural states: Secondary | ICD-10-CM | POA: Diagnosis not present

## 2017-10-02 DIAGNOSIS — H2513 Age-related nuclear cataract, bilateral: Secondary | ICD-10-CM | POA: Diagnosis not present

## 2017-10-02 DIAGNOSIS — H538 Other visual disturbances: Secondary | ICD-10-CM | POA: Diagnosis not present

## 2017-10-02 DIAGNOSIS — E109 Type 1 diabetes mellitus without complications: Secondary | ICD-10-CM | POA: Diagnosis not present

## 2017-10-02 DIAGNOSIS — H52203 Unspecified astigmatism, bilateral: Secondary | ICD-10-CM | POA: Diagnosis not present

## 2017-10-02 DIAGNOSIS — H524 Presbyopia: Secondary | ICD-10-CM | POA: Diagnosis not present

## 2017-10-02 LAB — HM DIABETES EYE EXAM

## 2017-10-20 ENCOUNTER — Telehealth: Payer: Self-pay | Admitting: Internal Medicine

## 2017-10-20 DIAGNOSIS — E1065 Type 1 diabetes mellitus with hyperglycemia: Secondary | ICD-10-CM

## 2017-10-20 DIAGNOSIS — I10 Essential (primary) hypertension: Secondary | ICD-10-CM

## 2017-10-20 DIAGNOSIS — I70209 Unspecified atherosclerosis of native arteries of extremities, unspecified extremity: Secondary | ICD-10-CM

## 2017-10-20 DIAGNOSIS — IMO0002 Reserved for concepts with insufficient information to code with codable children: Secondary | ICD-10-CM

## 2017-10-20 DIAGNOSIS — E1051 Type 1 diabetes mellitus with diabetic peripheral angiopathy without gangrene: Secondary | ICD-10-CM

## 2017-10-20 DIAGNOSIS — E10319 Type 1 diabetes mellitus with unspecified diabetic retinopathy without macular edema: Secondary | ICD-10-CM

## 2017-10-20 NOTE — Telephone Encounter (Signed)
Called and LVM to inform patient he is due for a 4 month FU, to please call the office back and set this up.

## 2017-10-20 NOTE — Telephone Encounter (Signed)
Pt is due for follow up appt.

## 2017-10-20 NOTE — Telephone Encounter (Signed)
losartan (COZAAR) 100 MG tablet [[id number]  Patient is requesting a refill on this medication.   [facility] ([address] [place] [phone] (Phone) [phone] (Fax)

## 2017-10-31 MED ORDER — LOSARTAN POTASSIUM 100 MG PO TABS: 50.0000 mg | ORAL_TABLET | Freq: Every day | ORAL | 0 refills | Status: DC

## 2017-10-31 NOTE — Addendum Note (Signed)
Addended by: Aviva Signs M on: 10/31/2017 02:59 PM   Modules accepted: Orders

## 2017-10-31 NOTE — Telephone Encounter (Addendum)
Appt made. Can refills be sent in

## 2017-11-10 NOTE — Telephone Encounter (Signed)
error 

## 2017-11-21 ENCOUNTER — Telehealth: Payer: Self-pay | Admitting: Emergency Medicine

## 2017-11-21 NOTE — Telephone Encounter (Signed)
Called patient to schedule AWV. Pt declined at this time. 

## 2017-11-23 ENCOUNTER — Other Ambulatory Visit (INDEPENDENT_AMBULATORY_CARE_PROVIDER_SITE_OTHER): Payer: Medicare Other

## 2017-11-23 ENCOUNTER — Encounter: Payer: Self-pay | Admitting: Internal Medicine

## 2017-11-23 ENCOUNTER — Ambulatory Visit (INDEPENDENT_AMBULATORY_CARE_PROVIDER_SITE_OTHER): Payer: Medicare Other | Admitting: Internal Medicine

## 2017-11-23 VITALS — BP 156/64 | HR 76 | Temp 97.6°F | Resp 16 | Ht 74.0 in | Wt 174.0 lb

## 2017-11-23 DIAGNOSIS — E1051 Type 1 diabetes mellitus with diabetic peripheral angiopathy without gangrene: Secondary | ICD-10-CM

## 2017-11-23 DIAGNOSIS — I2510 Atherosclerotic heart disease of native coronary artery without angina pectoris: Secondary | ICD-10-CM | POA: Diagnosis not present

## 2017-11-23 DIAGNOSIS — E039 Hypothyroidism, unspecified: Secondary | ICD-10-CM

## 2017-11-23 DIAGNOSIS — E785 Hyperlipidemia, unspecified: Secondary | ICD-10-CM

## 2017-11-23 DIAGNOSIS — I10 Essential (primary) hypertension: Secondary | ICD-10-CM

## 2017-11-23 DIAGNOSIS — I70209 Unspecified atherosclerosis of native arteries of extremities, unspecified extremity: Secondary | ICD-10-CM

## 2017-11-23 DIAGNOSIS — K635 Polyp of colon: Secondary | ICD-10-CM

## 2017-11-23 LAB — URINALYSIS, ROUTINE W REFLEX MICROSCOPIC
BILIRUBIN URINE: NEGATIVE
HGB URINE DIPSTICK: NEGATIVE
KETONES UR: NEGATIVE
LEUKOCYTES UA: NEGATIVE
NITRITE: NEGATIVE
Specific Gravity, Urine: 1.01 (ref 1.000–1.030)
TOTAL PROTEIN, URINE-UPE24: NEGATIVE
UROBILINOGEN UA: 0.2 (ref 0.0–1.0)
Urine Glucose: NEGATIVE
pH: 7 (ref 5.0–8.0)

## 2017-11-23 LAB — CBC WITH DIFFERENTIAL/PLATELET
BASOS ABS: 0.1 10*3/uL (ref 0.0–0.1)
Basophils Relative: 1.2 % (ref 0.0–3.0)
EOS ABS: 0.6 10*3/uL (ref 0.0–0.7)
Eosinophils Relative: 7 % — ABNORMAL HIGH (ref 0.0–5.0)
HEMATOCRIT: 44.2 % (ref 39.0–52.0)
HEMOGLOBIN: 15.3 g/dL (ref 13.0–17.0)
LYMPHS PCT: 25.4 % (ref 12.0–46.0)
Lymphs Abs: 2.2 10*3/uL (ref 0.7–4.0)
MCHC: 34.5 g/dL (ref 30.0–36.0)
MCV: 92.8 fl (ref 78.0–100.0)
MONOS PCT: 9.4 % (ref 3.0–12.0)
Monocytes Absolute: 0.8 10*3/uL (ref 0.1–1.0)
NEUTROS ABS: 4.9 10*3/uL (ref 1.4–7.7)
Neutrophils Relative %: 57 % (ref 43.0–77.0)
PLATELETS: 230 10*3/uL (ref 150.0–400.0)
RBC: 4.77 Mil/uL (ref 4.22–5.81)
RDW: 14.4 % (ref 11.5–15.5)
WBC: 8.7 10*3/uL (ref 4.0–10.5)

## 2017-11-23 LAB — IBC PANEL
IRON: 161 ug/dL (ref 42–165)
SATURATION RATIOS: 56.1 % — AB (ref 20.0–50.0)
TRANSFERRIN: 205 mg/dL — AB (ref 212.0–360.0)

## 2017-11-23 LAB — HEMOGLOBIN A1C: Hgb A1c MFr Bld: 7.9 % — ABNORMAL HIGH (ref 4.6–6.5)

## 2017-11-23 LAB — LIPID PANEL
CHOLESTEROL: 111 mg/dL (ref 0–200)
HDL: 44.9 mg/dL (ref >39.00)
LDL CALC: 49 mg/dL (ref 0–99)
NonHDL: 66.25
TRIGLYCERIDES: 87 mg/dL (ref 0.0–149.0)
Total CHOL/HDL Ratio: 2
VLDL: 17.4 mg/dL (ref 0.0–40.0)

## 2017-11-23 LAB — MICROALBUMIN / CREATININE URINE RATIO
Creatinine,U: 70.5 mg/dL
Microalb Creat Ratio: 2.5 mg/g (ref 0.0–30.0)
Microalb, Ur: 1.8 mg/dL (ref 0.0–1.9)

## 2017-11-23 LAB — FERRITIN: Ferritin: 106.7 ng/mL (ref 22.0–322.0)

## 2017-11-23 LAB — HM DIABETES FOOT EXAM

## 2017-11-23 LAB — TSH: TSH: 1.15 u[IU]/mL (ref 0.35–4.50)

## 2017-11-23 MED ORDER — AZILSARTAN MEDOXOMIL 80 MG PO TABS: 1.0000 | ORAL_TABLET | Freq: Every day | ORAL | 0 refills | Status: DC

## 2017-11-23 NOTE — Progress Notes (Signed)
Subjective:  Patient ID: Logan [name], male    DOB: [birth year]  Age: 67 y.o. MRN: [id number]  CC: Hypertension; Diabetes; and Hypothyroidism   HPI Logan [name] presents for f/up - He tells me that he has been doing a good job of controlling his blood sugars.  In the last month he has had one blood sugar above 130 and that was up to 174.  For the most part his blood sugars are staying down below 120.  He is concerned that his blood pressure has not been well controlled.  He denies any recent episodes of headache, blurred vision, chest pain, shortness of breath, dyspnea on exertion, edema, or fatigue.  About a year and a half ago he saw a cardiologist and it was recommended that he undergo a myocardial perfusion imaging.  It looks like that test was never completed.  Outpatient Medications Prior to Visit  Medication Sig Dispense Refill  . amLODipine (NORVASC) 10 MG tablet Take 1 tablet (10 mg total) by mouth daily. 90 tablet 1  . aspirin 81 MG tablet Take 81 mg by mouth 2 (two) times daily.     Marland Kitchen glucose blood (FREESTYLE LITE) test strip Use TID 300 each 1  . insulin glargine (LANTUS) 100 UNIT/ML injection Inject 0.2 mLs (20 Units total) into the skin at bedtime. 30 mL 3  . insulin NPH-regular Human (NOVOLIN 70/30) (70-30) 100 UNIT/ML injection Inject 20 Units into the skin 2 (two) times daily with a meal. 30 mL 11  . levothyroxine (SYNTHROID, LEVOTHROID) 88 MCG tablet Take 1 tablet (88 mcg total) by mouth daily. 90 tablet 1  . rosuvastatin (CRESTOR) 10 MG tablet TAKE 1 TABLET BY MOUTH EVERY DAY 90 tablet 3  . triamterene-hydrochlorothiazide (MAXZIDE-25) 37.5-25 MG tablet TAKE 1/2 TABLET BY MOUTH EVERY DAY 45 tablet 3  . losartan (COZAAR) 100 MG tablet Take 0.5 tablets (50 mg total) by mouth daily. 45 tablet 0   No facility-administered medications prior to visit.     ROS Review of Systems  Constitutional: Negative.  Negative for appetite change, diaphoresis and fatigue.  HENT:  Negative.   Eyes: Negative for visual disturbance.  Respiratory: Negative for cough, chest tightness, shortness of breath and wheezing.   Cardiovascular: Negative for chest pain, palpitations and leg swelling.  Gastrointestinal: Negative for abdominal pain, constipation, diarrhea, nausea and vomiting.  Endocrine: Negative for polydipsia, polyphagia and polyuria.  Genitourinary: Negative.  Negative for difficulty urinating, dysuria and urgency.  Musculoskeletal: Negative.  Negative for arthralgias, joint swelling and myalgias.  Skin: Negative.  Negative for color change, pallor and rash.  Neurological: Negative.  Negative for dizziness, weakness, light-headedness and headaches.  Hematological: Negative for adenopathy. Does not bruise/bleed easily.  Psychiatric/Behavioral: Negative.     Objective:  BP (!) 156/64 (BP Location: Left Arm, Patient Position: Sitting, Cuff Size: Normal)   Pulse 76   Temp 97.6 F (36.4 C) (Oral)   Resp 16   Ht 6\' 2"  (1.88 m)   Wt 174 lb (78.9 kg)   SpO2 98%   BMI 22.34 kg/m   BP Readings from Last 3 Encounters:  11/23/17 (!) 156/64  06/20/17 138/70  06/15/17 138/62    Wt Readings from Last 3 Encounters:  11/23/17 174 lb (78.9 kg)  06/20/17 174 lb (78.9 kg)  06/15/17 175 lb (79.4 kg)    Physical Exam  Constitutional: He is oriented to person, place, and time. No distress.  HENT:  Mouth/Throat: Oropharynx is clear and moist. No oropharyngeal  exudate.  Eyes: Conjunctivae are normal. No scleral icterus.  Neck: Normal range of motion. Neck supple. No JVD present. No thyromegaly present.  Cardiovascular: Normal rate, regular rhythm and normal heart sounds. Exam reveals no gallop.  No murmur heard. Pulmonary/Chest: Effort normal and breath sounds normal. No respiratory distress. He has no wheezes. He has no rales.  Abdominal: Soft. Normal appearance and bowel sounds are normal. He exhibits no mass. There is no hepatosplenomegaly. There is no  tenderness.  Musculoskeletal: Normal range of motion. He exhibits no edema, tenderness or deformity.  Lymphadenopathy:    He has no cervical adenopathy.  Neurological: He is alert and oriented to person, place, and time.  Skin: Skin is warm and dry. No rash noted. He is not diaphoretic.  Vitals reviewed.   Lab Results  Component Value Date   WBC 8.7 11/23/2017   HGB 15.3 11/23/2017   HCT 44.2 11/23/2017   PLT 230.0 11/23/2017   GLUCOSE 81 06/20/2017   CHOL 111 11/23/2017   TRIG 87.0 11/23/2017   HDL 44.90 11/23/2017   LDLCALC 49 11/23/2017   ALT 30 03/29/2016   AST 22 03/29/2016   NA 140 06/20/2017   K 3.4 (L) 06/20/2017   CL 98 06/20/2017   CREATININE 1.22 06/20/2017   BUN 26 (H) 06/20/2017   CO2 31 06/20/2017   TSH 1.15 11/23/2017   PSA 2.18 05/12/2017   HGBA1C 7.9 (H) 11/23/2017   MICROALBUR 1.8 11/23/2017    Ct Temporal Bones W/o Cm  Result Date: 07/18/2014 CLINICAL DATA:  BILATERAL chronic otitis media with severe hearing loss. EXAM: CT TEMPORAL BONES WITHOUT CONTRAST TECHNIQUE: Axial and coronal plane CT imaging of the petrous temporal bones was performed with thin-collimation image reconstruction. No intravenous contrast was administered. Multiplanar CT image reconstructions were also generated. COMPARISON:  None. FINDINGS: The RIGHT tympanic membrane is silhouetted by fluid and/or soft tissue in the RIGHT middle ear and fluid/soft tissue in the bony external canal, lateral to the ear drum. The osseous margins of the bony external canal appears slightly irregular, although the scutum is intact. There is marked disruption of the ossicular chain on the RIGHT. No recognizable malleo-incudal joint. Probable displacement of the stapes away from the oval window. There is soft tissue in the attic and thinning of the tegmen tympani. There is widening of the aditus ad antrum, coalescence of the mastoid air cells, and thinning or dehiscence of the tegmen mastoideum, all consistent  with chronic and widespread cholesteatoma. RIGHT inner ear structures intact without take down of the bony margins of the semicircular canals. On the LEFT, there is similar but less severe disease. The external canal is unremarkable. LEFT TM is retracted. Anatomic ossicular anatomy is observed without erosion or displacement of the malleus, incus, or stapes. Within the LEFT middle ear cavity, there is soft tissue/fluid inferiorly, but the attic remains aerated with normal-appearing tegmen tympani. Slight widening of the LEFT aditus ad antrum and coalescence of LEFT mastoid air cells but intact tegmen mastoideum. Normal LEFT inner ear structures. Unremarkable appearing noncontrast intracranial compartment. Normal visualized TMJs and extracranial soft tissues. No sinus air-fluid level is evident. IMPRESSION: Severe BILATERAL chronic otitis media with BILATERAL middle ear and mastoid cholesteatomas, RIGHT much worse than LEFT. 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 Similar/less severe changes on the LEFT. Dehiscence of the tegmen tympani and tegmen mastoideum on the RIGHT is suspected. Within limits of visualization on noncontrast exam, no visible intracranial  complication. Electronically Signed   By: Rolla Flatten M.D.   On: 07/18/2014 15:34    Assessment & Plan:   Doren was seen today for hypertension, diabetes and hypothyroidism.  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  He will continue to work on achieving better glycemic control. -     Hemoglobin A1c; Future -     Microalbumin / creatinine urine ratio; Future -     Azilsartan Medoxomil (EDARBI) 80 MG TABS; Take 1 tablet (80 mg total) by mouth daily.  Essential hypertension- His blood pressure is not  adequately well controlled.  I have asked him to upgrade to a more potent ARB.  I will also screen him for primary aldosteronism.  His electrolytes are stable and his renal function is normal. -     CBC with Differential/Platelet; Future -     Urinalysis, Routine w reflex microscopic; Future -     Aldosterone + renin activity w/ ratio; Future -     Azilsartan Medoxomil (EDARBI) 80 MG TABS; Take 1 tablet (80 mg total) by mouth daily.  Acquired hypothyroidism- His TSH is in the normal range.  He will remain on the current dose of levothyroxine. -     TSH; Future  Hyperlipidemia LDL goal <70- He has achieved his LDL goal and is doing well on the statin. 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  Will continue to monitor this. -     CBC with Differential/Platelet; Future -     IBC panel; Future -     Ferritin; Future  Polyp of colon, unspecified part of colon, unspecified type -     Ambulatory referral to Gastroenterology  Coronary artery disease involving native coronary artery of native heart without angina pectoris- He is due for a follow-up with cardiology. -     Ambulatory referral to Cardiology   I have discontinued Male L. Faires's losartan. I am also having him start on Azilsartan Medoxomil. Additionally, I am having him maintain his aspirin, insulin NPH-regular Human, insulin glargine, levothyroxine, rosuvastatin, triamterene-hydrochlorothiazide, amLODipine, and glucose blood.  Meds ordered this encounter  Medications  . Azilsartan Medoxomil (EDARBI) 80 MG TABS    Sig: Take 1 tablet (80 mg total) by mouth daily.    Dispense:  105 tablet    Refill:  0     Follow-up: Return in about 3 months (around 02/23/2018).  Scarlette Calico, MD

## 2017-11-23 NOTE — Patient Instructions (Signed)

## 2017-11-24 ENCOUNTER — Telehealth: Payer: Self-pay | Admitting: Internal Medicine

## 2017-11-24 NOTE — Telephone Encounter (Signed)
Left message for patient to return my call to schedule Recall Colon

## 2017-11-24 NOTE — Telephone Encounter (Signed)
-----   Message from [facility], [place] sent at 11/23/2017  8:52 AM EDT ----- Regarding: Colonoscopy I have that he is due for a colonoscopy. Last one was done in 2015 by Dr. Hilarie Fredrickson. Do you need me to put in another referral?

## 2017-11-27 LAB — ALDOSTERONE + RENIN ACTIVITY W/ RATIO
ALDO / PRA Ratio: 0.7 Ratio — ABNORMAL LOW (ref 0.9–28.9)
ALDOSTERONE: 7 ng/dL
RENIN ACTIVITY: 9.69 ng/mL/h — AB (ref 0.25–5.82)

## 2017-12-25 DIAGNOSIS — E7800 Pure hypercholesterolemia, unspecified: Secondary | ICD-10-CM | POA: Diagnosis not present

## 2017-12-25 DIAGNOSIS — E039 Hypothyroidism, unspecified: Secondary | ICD-10-CM | POA: Diagnosis not present

## 2017-12-25 DIAGNOSIS — E109 Type 1 diabetes mellitus without complications: Secondary | ICD-10-CM | POA: Diagnosis not present

## 2018-01-01 ENCOUNTER — Other Ambulatory Visit: Payer: Self-pay | Admitting: Internal Medicine

## 2018-01-01 DIAGNOSIS — E1140 Type 2 diabetes mellitus with diabetic neuropathy, unspecified: Secondary | ICD-10-CM | POA: Diagnosis not present

## 2018-01-01 DIAGNOSIS — E039 Hypothyroidism, unspecified: Secondary | ICD-10-CM | POA: Diagnosis not present

## 2018-01-01 DIAGNOSIS — E11319 Type 2 diabetes mellitus with unspecified diabetic retinopathy without macular edema: Secondary | ICD-10-CM | POA: Diagnosis not present

## 2018-01-01 DIAGNOSIS — E7800 Pure hypercholesterolemia, unspecified: Secondary | ICD-10-CM | POA: Diagnosis not present

## 2018-01-01 DIAGNOSIS — I2510 Atherosclerotic heart disease of native coronary artery without angina pectoris: Secondary | ICD-10-CM | POA: Diagnosis not present

## 2018-01-01 DIAGNOSIS — E109 Type 1 diabetes mellitus without complications: Secondary | ICD-10-CM | POA: Diagnosis not present

## 2018-01-01 DIAGNOSIS — I10 Essential (primary) hypertension: Secondary | ICD-10-CM | POA: Diagnosis not present

## 2018-01-22 ENCOUNTER — Encounter: Payer: Self-pay | Admitting: Internal Medicine

## 2018-01-22 ENCOUNTER — Other Ambulatory Visit: Payer: Self-pay | Admitting: Internal Medicine

## 2018-01-22 DIAGNOSIS — I10 Essential (primary) hypertension: Secondary | ICD-10-CM

## 2018-02-21 ENCOUNTER — Ambulatory Visit (INDEPENDENT_AMBULATORY_CARE_PROVIDER_SITE_OTHER): Payer: Medicare Other | Admitting: Internal Medicine

## 2018-02-21 ENCOUNTER — Encounter: Payer: Self-pay | Admitting: Internal Medicine

## 2018-02-21 ENCOUNTER — Other Ambulatory Visit (INDEPENDENT_AMBULATORY_CARE_PROVIDER_SITE_OTHER): Payer: Medicare Other

## 2018-02-21 VITALS — BP 142/78 | HR 61 | Temp 98.1°F | Resp 16 | Ht 74.0 in | Wt 175.8 lb

## 2018-02-21 DIAGNOSIS — E1051 Type 1 diabetes mellitus with diabetic peripheral angiopathy without gangrene: Secondary | ICD-10-CM

## 2018-02-21 DIAGNOSIS — I70209 Unspecified atherosclerosis of native arteries of extremities, unspecified extremity: Secondary | ICD-10-CM

## 2018-02-21 DIAGNOSIS — I10 Essential (primary) hypertension: Secondary | ICD-10-CM

## 2018-02-21 DIAGNOSIS — E1065 Type 1 diabetes mellitus with hyperglycemia: Secondary | ICD-10-CM

## 2018-02-21 DIAGNOSIS — IMO0002 Reserved for concepts with insufficient information to code with codable children: Secondary | ICD-10-CM

## 2018-02-21 DIAGNOSIS — E10319 Type 1 diabetes mellitus with unspecified diabetic retinopathy without macular edema: Secondary | ICD-10-CM | POA: Diagnosis not present

## 2018-02-21 LAB — BASIC METABOLIC PANEL
BUN: 18 mg/dL (ref 6–23)
CHLORIDE: 103 meq/L (ref 96–112)
CO2: 30 meq/L (ref 19–32)
CREATININE: 1.23 mg/dL (ref 0.40–1.50)
Calcium: 9.3 mg/dL (ref 8.4–10.5)
GFR: 62.37 mL/min (ref >60.00)
Glucose, Bld: 47 mg/dL — CL (ref 70–99)
Potassium: 3.7 mEq/L (ref 3.5–5.1)
Sodium: 141 mEq/L (ref 135–145)

## 2018-02-21 LAB — HEMOGLOBIN A1C: Hgb A1c MFr Bld: 7.8 % — ABNORMAL HIGH (ref 4.6–6.5)

## 2018-02-21 MED ORDER — AZILSARTAN MEDOXOMIL 80 MG PO TABS: 1.0000 | ORAL_TABLET | Freq: Every day | ORAL | 1 refills | Status: DC

## 2018-02-21 NOTE — Progress Notes (Signed)
Subjective:  Patient ID: Logan [name], male    DOB: [birth year]  Age: 67 y.o. MRN: [id number]  CC: Hypertension and Diabetes   HPI Logan [name] presents for f/up - He tells me his blood pressure and blood sugars have been well controlled.  He has felt well recently and offers no complaints.  He has been decreasing his intake of calories because he thought it would help with his blood sugar control.  Outpatient Medications Prior to Visit  Medication Sig Dispense Refill  . amLODipine (NORVASC) 10 MG tablet TAKE 1 TABLET BY MOUTH ONCE DAILY 90 tablet 1  . aspirin 81 MG tablet Take 81 mg by mouth 2 (two) times daily.     Marland Kitchen glucose blood (FREESTYLE LITE) test strip Use TID 300 each 1  . insulin glargine (LANTUS) 100 UNIT/ML injection Inject 0.2 mLs (20 Units total) into the skin at bedtime. 30 mL 3  . insulin NPH-regular Human (NOVOLIN 70/30) (70-30) 100 UNIT/ML injection Inject 20 Units into the skin 2 (two) times daily with a meal. 30 mL 11  . levothyroxine (SYNTHROID, LEVOTHROID) 88 MCG tablet TAKE 1 TABLET BY MOUTH ONCE DAILY 90 tablet 1  . rosuvastatin (CRESTOR) 10 MG tablet TAKE 1 TABLET BY MOUTH EVERY DAY 90 tablet 3  . triamterene-hydrochlorothiazide (MAXZIDE-25) 37.5-25 MG tablet TAKE 1/2 TABLET BY MOUTH EVERY DAY 45 tablet 3  . Azilsartan Medoxomil (EDARBI) 80 MG TABS Take 1 tablet (80 mg total) by mouth daily. 105 tablet 0   No facility-administered medications prior to visit.     ROS Review of Systems  Constitutional: Negative for diaphoresis, fatigue and unexpected weight change.  HENT: Negative.   Eyes: Negative for visual disturbance.  Respiratory: Negative for cough, chest tightness, shortness of breath and wheezing.   Cardiovascular: Negative for chest pain, palpitations and leg swelling.  Gastrointestinal: Negative for abdominal pain, constipation, diarrhea and nausea.  Endocrine: Negative for cold intolerance, heat intolerance, polydipsia, polyphagia and polyuria.    Genitourinary: Negative.  Negative for difficulty urinating and dysuria.  Musculoskeletal: Negative.  Negative for arthralgias and myalgias.  Skin: Negative.  Negative for color change and pallor.  Neurological: Negative.  Negative for dizziness, weakness and light-headedness.  Hematological: Negative for adenopathy. Does not bruise/bleed easily.  Psychiatric/Behavioral: Negative.     Objective:  BP (!) 142/78 (BP Location: Left Arm, Patient Position: Sitting, Cuff Size: Normal)   Pulse 61   Temp 98.1 F (36.7 C) (Oral)   Resp 16   Ht 6\' 2"  (1.88 m)   Wt 175 lb 12 oz (79.7 kg)   SpO2 99%   BMI 22.56 kg/m   BP Readings from Last 3 Encounters:  02/21/18 (!) 142/78  11/23/17 (!) 156/64  06/20/17 138/70    Wt Readings from Last 3 Encounters:  02/21/18 175 lb 12 oz (79.7 kg)  11/23/17 174 lb (78.9 kg)  06/20/17 174 lb (78.9 kg)    Physical Exam  Constitutional: He is oriented to person, place, and time. No distress.  HENT:  Mouth/Throat: Oropharynx is clear and moist. No oropharyngeal exudate.  Eyes: Conjunctivae are normal. No scleral icterus.  Neck: Normal range of motion. Neck supple. No JVD present. No thyromegaly present.  Cardiovascular: Normal rate, regular rhythm and normal heart sounds. Exam reveals no gallop and no friction rub.  No murmur heard. Pulmonary/Chest: Effort normal and breath sounds normal. No respiratory distress. He has no wheezes. He has no rales.  Abdominal: Soft. Bowel sounds are normal. He exhibits  no mass. There is no hepatosplenomegaly. There is no tenderness.  Musculoskeletal: Normal range of motion. He exhibits no edema, tenderness or deformity.  Lymphadenopathy:    He has no cervical adenopathy.  Neurological: He is alert and oriented to person, place, and time.  Skin: Skin is warm and dry. No rash noted. He is not diaphoretic.  Vitals reviewed.   Lab Results  Component Value Date   WBC 8.7 11/23/2017   HGB 15.3 11/23/2017   HCT 44.2  11/23/2017   PLT 230.0 11/23/2017   GLUCOSE 47 (LL) 02/21/2018   CHOL 111 11/23/2017   TRIG 87.0 11/23/2017   HDL 44.90 11/23/2017   LDLCALC 49 11/23/2017   ALT 30 03/29/2016   AST 22 03/29/2016   NA 141 02/21/2018   K 3.7 02/21/2018   CL 103 02/21/2018   CREATININE 1.23 02/21/2018   BUN 18 02/21/2018   CO2 30 02/21/2018   TSH 1.15 11/23/2017   PSA 2.18 05/12/2017   HGBA1C 7.8 (H) 02/21/2018   MICROALBUR 1.8 11/23/2017    Ct Temporal Bones W/o Cm  Result Date: 07/18/2014 CLINICAL DATA:  BILATERAL chronic otitis media with severe hearing loss. EXAM: CT TEMPORAL BONES WITHOUT CONTRAST TECHNIQUE: Axial and coronal plane CT imaging of the petrous temporal bones was performed with thin-collimation image reconstruction. No intravenous contrast was administered. Multiplanar CT image reconstructions were also generated. COMPARISON:  None. FINDINGS: The RIGHT tympanic membrane is silhouetted by fluid and/or soft tissue in the RIGHT middle ear and fluid/soft tissue in the bony external canal, lateral to the ear drum. The osseous margins of the bony external canal appears slightly irregular, although the scutum is intact. There is marked disruption of the ossicular chain on the RIGHT. No recognizable malleo-incudal joint. Probable displacement of the stapes away from the oval window. There is soft tissue in the attic and thinning of the tegmen tympani. There is widening of the aditus ad antrum, coalescence of the mastoid air cells, and thinning or dehiscence of the tegmen mastoideum, all consistent with chronic and widespread cholesteatoma. RIGHT inner ear structures intact without take down of the bony margins of the semicircular canals. On the LEFT, there is similar but less severe disease. The external canal is unremarkable. LEFT TM is retracted. Anatomic ossicular anatomy is observed without erosion or displacement of the malleus, incus, or stapes. Within the LEFT middle ear cavity, there is soft  tissue/fluid inferiorly, but the attic remains aerated with normal-appearing tegmen tympani. Slight widening of the LEFT aditus ad antrum and coalescence of LEFT mastoid air cells but intact tegmen mastoideum. Normal LEFT inner ear structures. Unremarkable appearing noncontrast intracranial compartment. Normal visualized TMJs and extracranial soft tissues. No sinus air-fluid level is evident. IMPRESSION: Severe BILATERAL chronic otitis media with BILATERAL middle ear and mastoid cholesteatomas, RIGHT much worse than LEFT. 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 Similar/less severe changes on the LEFT. Dehiscence of the tegmen tympani and tegmen mastoideum on the RIGHT is suspected. Within limits of visualization on noncontrast exam, no visible intracranial complication. Electronically Signed   By: Rolla Flatten M.D.   On: 07/18/2014 15:34    Assessment & Plan:   Logan [name] was seen today for hypertension and diabetes.  Diagnoses and all orders for this visit:  Essential hypertension- His blood pressure is adequately well controlled.  Will continue the combination of a thiazide diuretic and an ARB. -     Basic metabolic panel; Future -  Azilsartan Medoxomil (EDARBI) 80 MG TABS; Take 1 tablet (80 mg total) by mouth daily.  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  I have asked him to liberalize his intake of calories.  His A1c is up to 7.8% so overall his blood sugars are not as well-controlled as they were last time.  He is followed by endocrinology for this. -     Hemoglobin A1c; Future -     Azilsartan Medoxomil (EDARBI) 80 MG TABS; Take 1 tablet (80 mg total) by mouth daily.  Type 1 diabetes, uncontrolled, with retinopathy ([place])- As above -     Hemoglobin A1c; Future   I am having Logan [name] maintain his aspirin, insulin  NPH-regular Human, insulin glargine, rosuvastatin, triamterene-hydrochlorothiazide, glucose blood, levothyroxine, amLODipine, and Azilsartan Medoxomil.  Meds ordered this encounter  Medications  . Azilsartan Medoxomil (EDARBI) 80 MG TABS    Sig: Take 1 tablet (80 mg total) by mouth daily.    Dispense:  90 tablet    Refill:  1     Follow-up: Return in about 6 months (around 08/22/2018).  Scarlette Calico, MD

## 2018-02-21 NOTE — Patient Instructions (Signed)

## 2018-02-22 ENCOUNTER — Encounter: Payer: Self-pay | Admitting: Internal Medicine

## 2018-03-05 DIAGNOSIS — Z23 Encounter for immunization: Secondary | ICD-10-CM | POA: Diagnosis not present

## 2018-03-21 ENCOUNTER — Telehealth: Payer: Self-pay | Admitting: Internal Medicine

## 2018-03-21 NOTE — Telephone Encounter (Signed)
New message     *STAT* If patient is at the pharmacy, call can be transferred to refill team.   1. Which medications need to be refilled? (please list name of each medication and dose if known) triamt/hctz 37.5-25mg  ,rosuvastatin 10 mg  2. Which pharmacy/location (including street and city if local pharmacy) is medication to be sent to?[facility] at [address]   3. Do they need a 30 day or 90 day supply? [place]

## 2018-03-21 NOTE — Telephone Encounter (Signed)
Attempted again to reach patient, but was unsuccessful.

## 2018-03-21 NOTE — Telephone Encounter (Signed)
Called patient back but did not get an answer. Will try again later.

## 2018-03-22 NOTE — Telephone Encounter (Signed)
Spoke with patient as he should have medication through January 2020, which is when he is due for a one year follow up appointment with Dr Harrington Challenger. [facility] refills [facility] but he has changed pharmacies to [place]. He had these transferred to [facility] and they called him yesterday and told him that he had an rx ready for pick up but he does not know which one. When he goes to [facility] to pick up the one that is ready, he will ask them if they need [place] to send in any updated rx's.

## 2018-03-30 ENCOUNTER — Other Ambulatory Visit: Payer: Self-pay

## 2018-03-30 MED ORDER — TRIAMTERENE-HCTZ 37.5-25 MG PO TABS: 0.5000 | ORAL_TABLET | Freq: Every day | ORAL | 1 refills | Status: DC

## 2018-04-04 DIAGNOSIS — E109 Type 1 diabetes mellitus without complications: Secondary | ICD-10-CM | POA: Diagnosis not present

## 2018-04-04 DIAGNOSIS — Z9889 Other specified postprocedural states: Secondary | ICD-10-CM | POA: Diagnosis not present

## 2018-04-04 DIAGNOSIS — H2513 Age-related nuclear cataract, bilateral: Secondary | ICD-10-CM | POA: Diagnosis not present

## 2018-05-12 ENCOUNTER — Other Ambulatory Visit: Payer: Self-pay | Admitting: Internal Medicine

## 2018-05-12 DIAGNOSIS — E1051 Type 1 diabetes mellitus with diabetic peripheral angiopathy without gangrene: Secondary | ICD-10-CM

## 2018-05-12 DIAGNOSIS — I70209 Unspecified atherosclerosis of native arteries of extremities, unspecified extremity: Principal | ICD-10-CM

## 2018-05-12 DIAGNOSIS — IMO0002 Reserved for concepts with insufficient information to code with codable children: Secondary | ICD-10-CM

## 2018-05-12 DIAGNOSIS — E1065 Type 1 diabetes mellitus with hyperglycemia: Secondary | ICD-10-CM

## 2018-05-12 DIAGNOSIS — E10311 Type 1 diabetes mellitus with unspecified diabetic retinopathy with macular edema: Secondary | ICD-10-CM

## 2018-06-24 ENCOUNTER — Other Ambulatory Visit: Payer: Self-pay | Admitting: Internal Medicine

## 2018-06-27 ENCOUNTER — Other Ambulatory Visit: Payer: Self-pay | Admitting: Internal Medicine

## 2018-06-27 MED ORDER — ROSUVASTATIN CALCIUM 10 MG PO TABS: 10.0000 mg | ORAL_TABLET | Freq: Every day | ORAL | 0 refills | Status: DC

## 2018-06-27 NOTE — Telephone Encounter (Signed)
Pt's medication was sent to pt's pharmacy as requested. Confirmation received.  °

## 2018-06-29 ENCOUNTER — Encounter: Payer: Self-pay | Admitting: Internal Medicine

## 2018-07-03 ENCOUNTER — Encounter: Payer: Self-pay | Admitting: Internal Medicine

## 2018-07-03 ENCOUNTER — Ambulatory Visit (INDEPENDENT_AMBULATORY_CARE_PROVIDER_SITE_OTHER): Payer: Medicare Other | Admitting: Internal Medicine

## 2018-07-03 ENCOUNTER — Other Ambulatory Visit (INDEPENDENT_AMBULATORY_CARE_PROVIDER_SITE_OTHER): Payer: Medicare Other

## 2018-07-03 VITALS — BP 132/58 | HR 75 | Temp 97.5°F | Resp 16 | Ht 74.0 in | Wt 171.0 lb

## 2018-07-03 DIAGNOSIS — I70209 Unspecified atherosclerosis of native arteries of extremities, unspecified extremity: Secondary | ICD-10-CM | POA: Diagnosis not present

## 2018-07-03 DIAGNOSIS — N182 Chronic kidney disease, stage 2 (mild): Secondary | ICD-10-CM | POA: Diagnosis not present

## 2018-07-03 DIAGNOSIS — Z1159 Encounter for screening for other viral diseases: Secondary | ICD-10-CM | POA: Diagnosis not present

## 2018-07-03 DIAGNOSIS — E1051 Type 1 diabetes mellitus with diabetic peripheral angiopathy without gangrene: Secondary | ICD-10-CM

## 2018-07-03 DIAGNOSIS — N400 Enlarged prostate without lower urinary tract symptoms: Secondary | ICD-10-CM

## 2018-07-03 DIAGNOSIS — E10319 Type 1 diabetes mellitus with unspecified diabetic retinopathy without macular edema: Secondary | ICD-10-CM

## 2018-07-03 DIAGNOSIS — E039 Hypothyroidism, unspecified: Secondary | ICD-10-CM

## 2018-07-03 DIAGNOSIS — IMO0002 Reserved for concepts with insufficient information to code with codable children: Secondary | ICD-10-CM

## 2018-07-03 DIAGNOSIS — E1065 Type 1 diabetes mellitus with hyperglycemia: Secondary | ICD-10-CM | POA: Diagnosis not present

## 2018-07-03 DIAGNOSIS — I10 Essential (primary) hypertension: Secondary | ICD-10-CM | POA: Diagnosis not present

## 2018-07-03 DIAGNOSIS — Z0000 Encounter for general adult medical examination without abnormal findings: Secondary | ICD-10-CM | POA: Diagnosis not present

## 2018-07-03 DIAGNOSIS — E10311 Type 1 diabetes mellitus with unspecified diabetic retinopathy with macular edema: Secondary | ICD-10-CM | POA: Diagnosis not present

## 2018-07-03 LAB — HEMOGLOBIN A1C: HEMOGLOBIN A1C: 8.4 % — AB (ref 4.6–6.5)

## 2018-07-03 LAB — COMPREHENSIVE METABOLIC PANEL
ALT: 22 U/L (ref 0–53)
AST: 22 U/L (ref 0–37)
Albumin: 4.6 g/dL (ref 3.5–5.2)
Alkaline Phosphatase: 119 U/L — ABNORMAL HIGH (ref 39–117)
BUN: 32 mg/dL — ABNORMAL HIGH (ref 6–23)
CO2: 28 mEq/L (ref 19–32)
Calcium: 9.9 mg/dL (ref 8.4–10.5)
Chloride: 102 mEq/L (ref 96–112)
Creatinine, Ser: 1.38 mg/dL (ref 0.40–1.50)
GFR: 51.33 mL/min — ABNORMAL LOW (ref >60.00)
Glucose, Bld: 52 mg/dL — ABNORMAL LOW (ref 70–99)
Potassium: 4.1 mEq/L (ref 3.5–5.1)
Sodium: 140 mEq/L (ref 135–145)
Total Bilirubin: 0.8 mg/dL (ref 0.2–1.2)
Total Protein: 7.2 g/dL (ref 6.0–8.3)

## 2018-07-03 LAB — PSA: PSA: 3.32 ng/mL (ref 0.10–4.00)

## 2018-07-03 LAB — TSH: TSH: 1.19 u[IU]/mL (ref 0.35–4.50)

## 2018-07-03 MED ORDER — AMLODIPINE BESYLATE 10 MG PO TABS: 10.0000 mg | ORAL_TABLET | Freq: Every day | ORAL | 1 refills | Status: DC

## 2018-07-03 MED ORDER — AZILSARTAN MEDOXOMIL 80 MG PO TABS: 1.0000 | ORAL_TABLET | Freq: Every day | ORAL | 1 refills | Status: DC

## 2018-07-03 MED ORDER — LEVOTHYROXINE SODIUM 88 MCG PO TABS: 88.0000 ug | ORAL_TABLET | Freq: Every day | ORAL | 1 refills | Status: DC

## 2018-07-03 MED ORDER — INSULIN GLARGINE 100 UNIT/ML ~~LOC~~ SOLN: SUBCUTANEOUS | 2 refills | Status: DC

## 2018-07-03 MED ORDER — GLUCOSE BLOOD VI STRP: ORAL_STRIP | 1 refills | Status: DC

## 2018-07-03 NOTE — Patient Instructions (Signed)

## 2018-07-03 NOTE — Progress Notes (Signed)
Subjective:  Patient ID: Logan [name], male    DOB: 07/04/1950  Age: 68 y.o. MRN: [id number]  CC: Annual Exam; Hypertension; Diabetes; and Hypothyroidism   HPI Logan [name] presents for a CPX.  He is very active and denies any recent episodes of CP, DOE, palpitations, edema, or fatigue.  He tells me he recently saw a dietitian and has improved his diet and subsequently has lost weight.  He thinks his blood pressure and his blood sugars have been well controlled.  Past Medical History:  Diagnosis Date  . Coronary artery disease involving native coronary artery of native heart without angina pectoris 11/01/2007   Last Cath '04: LAD w/ 60% long lesion; 1st Dx 70% ostial; 2nd Dx  70% ostial; Cx 30% ostial; RCA-dominant multiple 30-40% lesions; PDA 30% lesions  Last nuclear stress March '09 - negative for ischemia   . Diabetes mellitus   . Diabetic retinopathy   . Hearing loss   . Hemochromatosis   . Hyperlipidemia   . Hypertension    Past Surgical History:  Procedure Laterality Date  . 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 INNER EAR SURGERY     right    reports that he has never smoked. He has never used smokeless tobacco. He reports current alcohol use. He reports that he does not use drugs. family history includes Alcohol abuse in his father; Asthma in his mother; Cancer in his father; Colon cancer in his maternal uncle; Emphysema in his mother; Heart failure in his mother; Stroke in his father. No Known Allergies  Outpatient Medications Prior to Visit  Medication Sig Dispense Refill  . aspirin 81 MG tablet Take 81 mg by mouth 2 (two) times daily.     . insulin NPH-regular Human (NOVOLIN 70/30) (70-30) 100 UNIT/ML injection Inject 20 Units into the skin 2 (two) times daily with a meal. 30 mL 11  . rosuvastatin (CRESTOR) 10 MG  tablet Take 1 tablet (10 mg total) by mouth daily. Please keep upcoming appt with Dr. Harrington Challenger in February for future refills. Thank you 90 tablet 0  . triamterene-hydrochlorothiazide (MAXZIDE-25) 37.5-25 MG tablet Take 0.5 tablets by mouth daily. 45 tablet 1  . amLODipine (NORVASC) 10 MG tablet TAKE 1 TABLET BY MOUTH ONCE DAILY 90 tablet 1  . Azilsartan Medoxomil (EDARBI) 80 MG TABS Take 1 tablet (80 mg total) by mouth daily. 90 tablet 1  . glucose blood (FREESTYLE LITE) test strip Use TID 300 each 1  . LANTUS 100 UNIT/ML injection  INJECT 0.2MLS INTO THE SKIN ONCE DAILY AT BEDTIME 10 mL 5  . levothyroxine (SYNTHROID, LEVOTHROID) 88 MCG tablet TAKE 1 TABLET BY MOUTH ONCE DAILY 90 tablet 1   No facility-administered medications prior to visit.     ROS Review of Systems  Constitutional: Negative.  Negative for diaphoresis, fatigue and unexpected weight change.  HENT: Negative.  Negative for trouble swallowing.   Eyes: Negative for visual disturbance.  Respiratory: Negative for cough, chest tightness and shortness of breath.   Cardiovascular: Negative for chest pain and leg swelling.  Gastrointestinal: Negative.  Negative for abdominal pain, constipation, diarrhea, nausea and vomiting.  Endocrine: Negative for cold intolerance, heat intolerance, polydipsia, polyphagia and polyuria.  Genitourinary: Negative.  Negative for difficulty urinating, dysuria, penile swelling, scrotal swelling, testicular pain and urgency.  Musculoskeletal: Negative for arthralgias  and myalgias.  Skin: Negative.  Negative for color change and pallor.  Neurological: Negative.  Negative for dizziness, weakness, light-headedness and headaches.  Hematological: Negative for adenopathy. Does not bruise/bleed easily.  Psychiatric/Behavioral: Negative.     Objective:  BP (!) 132/58 (BP Location: Left Arm, Patient Position: Sitting, Cuff Size: Normal)   Pulse 75   Temp (!) 97.5 F (36.4 C) (Oral)   Resp 16   Ht 6\' 2"  (1.88  m)   Wt 171 lb (77.6 kg)   SpO2 99%   BMI 21.96 kg/m   BP Readings from Last 3 Encounters:  07/03/18 (!) 132/58  02/21/18 (!) 142/78  11/23/17 (!) 156/64    Wt Readings from Last 3 Encounters:  07/03/18 171 lb (77.6 kg)  02/21/18 175 lb 12 oz (79.7 kg)  11/23/17 174 lb (78.9 kg)    Physical Exam Vitals signs reviewed.  Constitutional:      Appearance: He is not ill-appearing or diaphoretic.  HENT:     Nose: Nose normal. No congestion or rhinorrhea.     Mouth/Throat:     Mouth: Mucous membranes are moist.     Pharynx: No oropharyngeal exudate or posterior oropharyngeal erythema.  Eyes:     General: No scleral icterus.    Conjunctiva/sclera: Conjunctivae normal.  Neck:     Musculoskeletal: Normal range of motion and neck supple. No muscular tenderness.  Cardiovascular:     Rate and Rhythm: Normal rate and regular rhythm.     Pulses: Normal pulses.     Heart sounds: No murmur. No gallop.   Pulmonary:     Effort: Pulmonary effort is normal.     Breath sounds: Normal breath sounds. No wheezing or rhonchi.  Abdominal:     General: Abdomen is flat. Bowel sounds are normal.     Palpations: There is no hepatomegaly, splenomegaly or mass.     Tenderness: There is no abdominal tenderness.     Hernia: There is no hernia in the right inguinal area or left inguinal area.  Genitourinary:    Pubic Area: No rash.      Penis: Uncircumcised. No phimosis, paraphimosis, hypospadias, erythema, tenderness, discharge, swelling or lesions.      Scrotum/Testes: Normal.        Right: Mass, tenderness or swelling not present.        Left: Mass, tenderness or swelling not present.     Epididymis:     Right: Normal. Not enlarged. No mass.     Left: Not enlarged. No mass.     Prostate: Enlarged (1+ smooth symm BPH). Not tender and no nodules present.     Rectum: Normal. Guaiac result negative. No mass, tenderness, anal fissure, external hemorrhoid or internal hemorrhoid. Normal anal tone.    Musculoskeletal: Normal range of motion.        General: No swelling.     Right lower leg: No edema.     Left lower leg: No edema.  Lymphadenopathy:     Cervical: No cervical adenopathy.     Lower Body: No right inguinal adenopathy. No left inguinal adenopathy.  Skin:    General: Skin is dry.     Coloration: Skin is not pale.     Findings: No erythema or rash.  Neurological:     General: No focal deficit present.     Mental Status: He is oriented to person, place, and time. Mental status is at baseline.     Lab Results  Component Value Date  WBC 8.7 11/23/2017   HGB 15.3 11/23/2017   HCT 44.2 11/23/2017   PLT 230.0 11/23/2017   GLUCOSE 52 (L) 07/03/2018   CHOL 111 11/23/2017   TRIG 87.0 11/23/2017   HDL 44.90 11/23/2017   LDLCALC 49 11/23/2017   ALT 22 07/03/2018   AST 22 07/03/2018   NA 140 07/03/2018   K 4.1 07/03/2018   CL 102 07/03/2018   CREATININE 1.38 07/03/2018   BUN 32 (H) 07/03/2018   CO2 28 07/03/2018   TSH 1.19 07/03/2018   PSA 3.32 07/03/2018   HGBA1C 8.4 (H) 07/03/2018   MICROALBUR 1.8 11/23/2017    Ct Temporal Bones W/o Cm  Result Date: 07/18/2014 CLINICAL DATA:  BILATERAL chronic otitis media with severe hearing loss. EXAM: CT TEMPORAL BONES WITHOUT CONTRAST TECHNIQUE: Axial and coronal plane CT imaging of the petrous temporal bones was performed with thin-collimation image reconstruction. No intravenous contrast was administered. Multiplanar CT image reconstructions were also generated. COMPARISON:  None. FINDINGS: The RIGHT tympanic membrane is silhouetted by fluid and/or soft tissue in the RIGHT middle ear and fluid/soft tissue in the bony external canal, lateral to the ear drum. The osseous margins of the bony external canal appears slightly irregular, although the scutum is intact. There is marked disruption of the ossicular chain on the RIGHT. No recognizable malleo-incudal joint. Probable displacement of the stapes away from the oval window. There  is soft tissue in the attic and thinning of the tegmen tympani. There is widening of the aditus ad antrum, coalescence of the mastoid air cells, and thinning or dehiscence of the tegmen mastoideum, all consistent with chronic and widespread cholesteatoma. RIGHT inner ear structures intact without take down of the bony margins of the semicircular canals. On the LEFT, there is similar but less severe disease. The external canal is unremarkable. LEFT TM is retracted. Anatomic ossicular anatomy is observed without erosion or displacement of the malleus, incus, or stapes. Within the LEFT middle ear cavity, there is soft tissue/fluid inferiorly, but the attic remains aerated with normal-appearing tegmen tympani. Slight widening of the LEFT aditus ad antrum and coalescence of LEFT mastoid air cells but intact tegmen mastoideum. Normal LEFT inner ear structures. Unremarkable appearing noncontrast intracranial compartment. Normal visualized TMJs and extracranial soft tissues. No sinus air-fluid level is evident. IMPRESSION: Severe BILATERAL chronic otitis media with BILATERAL middle ear and mastoid cholesteatomas, RIGHT much worse than LEFT. 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 Similar/less severe changes on the LEFT. Dehiscence of the tegmen tympani and tegmen mastoideum on the RIGHT is suspected. Within limits of visualization on noncontrast exam, no visible intracranial complication. Electronically Signed   By: Rolla Flatten M.D.   On: 07/18/2014 15:34    Assessment & Plan:   Jekhi was seen today for annual exam, hypertension, diabetes and hypothyroidism.  Diagnoses and all orders for this visit:  Hypothyroidism, unspecified type-TSH is in the normal range.  He will remain on the current dose of levothyroxine. -     TSH; Future -     levothyroxine (SYNTHROID, LEVOTHROID) 88 MCG tablet; Take 1 tablet (88 mcg total) by mouth  daily.  Type 1 diabetes, uncontrolled, with retinopathy (HCC)-his A1c is up to 8.4%.  I have asked him to see his endocrinologist to try to gain better blood sugar control. -     Comprehensive metabolic panel; Future -     Hemoglobin A1c; Future -     insulin glargine (LANTUS)  100 UNIT/ML injection; INJECT 0.2MLS INTO THE SKIN ONCE DAILY AT BEDTIME -     glucose blood (FREESTYLE LITE) test strip; Use TID -     Ambulatory referral to Endocrinology  Benign prostatic hyperplasia without lower urinary tract symptoms -his PSA is normal which is reassuring that he does not have prostate cancer.  He has no symptoms that need to be treated. -     PSA; Future  Need for hepatitis C screening test -     Hepatitis C antibody; Future  Essential hypertension- His blood pressure is adequately well controlled.  Electrolytes and renal function are normal. -     amLODipine (NORVASC) 10 MG tablet; Take 1 tablet (10 mg total) by mouth daily. -     Azilsartan Medoxomil (EDARBI) 80 MG TABS; Take 1 tablet (80 mg total) by mouth daily.  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  He will avoid nephrotoxic agents.  Will continue to work to gain better control of his blood pressure and his blood  sugars.  Routine health maintenance   I have changed Janson L. Kandler's LANTUS to insulin glargine. I have also changed his amLODipine and levothyroxine. I am also having him maintain his aspirin, insulin NPH-regular Human, triamterene-hydrochlorothiazide, rosuvastatin, Azilsartan Medoxomil, and glucose blood.  Meds ordered this encounter  Medications  . amLODipine (NORVASC) 10 MG tablet    Sig: Take 1 tablet (10 mg total) by mouth daily.    Dispense:  90 tablet    Refill:  1  . insulin glargine (LANTUS) 100 UNIT/ML injection    Sig: INJECT 0.2MLS INTO THE SKIN ONCE DAILY AT BEDTIME    Dispense:  30 mL    Refill:  2    Please consider 90 day supplies to promote better adherence  . Azilsartan Medoxomil (EDARBI) 80 MG TABS    Sig: Take 1 tablet (80 mg total) by mouth daily.    Dispense:  90 tablet    Refill:  1  . glucose blood (FREESTYLE LITE) test strip    Sig: Use TID    Dispense:  300 each    Refill:  1  . levothyroxine (SYNTHROID, LEVOTHROID) 88 MCG tablet    Sig: Take 1 tablet (88 mcg total) by mouth daily.    Dispense:  90 tablet    Refill:  1   See AVS for instructions about healthy living and anticipatory guidance.  Follow-up: Return in about 6 months (around 01/01/2019).  Scarlette Calico, MD

## 2018-07-04 ENCOUNTER — Encounter: Payer: Self-pay | Admitting: Internal Medicine

## 2018-07-04 LAB — HEPATITIS C ANTIBODY
Hepatitis C Ab: NONREACTIVE
SIGNAL TO CUT-OFF: 0.09 (ref <1.00)

## 2018-07-04 NOTE — Assessment & Plan Note (Signed)

## 2018-07-20 ENCOUNTER — Ambulatory Visit (INDEPENDENT_AMBULATORY_CARE_PROVIDER_SITE_OTHER): Payer: Medicare Other | Admitting: Internal Medicine

## 2018-07-20 ENCOUNTER — Encounter: Payer: Self-pay | Admitting: Internal Medicine

## 2018-07-20 VITALS — BP 142/60 | HR 83 | Ht 74.0 in | Wt 169.1 lb

## 2018-07-20 DIAGNOSIS — I10 Essential (primary) hypertension: Secondary | ICD-10-CM

## 2018-07-20 DIAGNOSIS — I70209 Unspecified atherosclerosis of native arteries of extremities, unspecified extremity: Secondary | ICD-10-CM

## 2018-07-20 DIAGNOSIS — E785 Hyperlipidemia, unspecified: Secondary | ICD-10-CM

## 2018-07-20 DIAGNOSIS — I2510 Atherosclerotic heart disease of native coronary artery without angina pectoris: Secondary | ICD-10-CM

## 2018-07-20 DIAGNOSIS — E1051 Type 1 diabetes mellitus with diabetic peripheral angiopathy without gangrene: Secondary | ICD-10-CM | POA: Diagnosis not present

## 2018-07-20 MED ORDER — ROSUVASTATIN CALCIUM 10 MG PO TABS: 10.0000 mg | ORAL_TABLET | Freq: Every day | ORAL | 3 refills | Status: DC

## 2018-07-20 MED ORDER — TRIAMTERENE-HCTZ 37.5-25 MG PO TABS: 0.5000 | ORAL_TABLET | Freq: Every day | ORAL | 3 refills | Status: DC

## 2018-07-20 NOTE — Progress Notes (Signed)
Cardiology Office Note   Date:  07/20/2018   ID:  Logan [name], DOB [birth year], MRN [id number]  PCP:  Janith Lima, MD  Cardiologist:   Dorris Carnes, MD    F?U of CAD and HTN     History of Present Illness: Logan [name] is a 68 y.o. male with a history of history of CAD. 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 He had nonobstructive disease at catheterization in 2004. He had a negative Myoview scan in 2009 with an ejection fraction of 49%.  Pt has also had syncope in past Known bradycardia.  I saw the pt in Jan 2019 Since seen he continues to do well   Breathing is good   No CP   No SOB   He continues to be active outside     Current Meds  Medication Sig  . amLODipine (NORVASC) 10 MG tablet Take 1 tablet (10 mg total) by mouth daily.  Marland Kitchen aspirin 81 MG tablet Take 81 mg by mouth 2 (two) times daily.   . Azilsartan Medoxomil (EDARBI) 80 MG TABS Take 1 tablet (80 mg total) by mouth daily.  Marland Kitchen glucose blood (FREESTYLE LITE) test strip Use TID  . insulin glargine (LANTUS) 100 UNIT/ML injection INJECT 0.2MLS INTO THE SKIN ONCE DAILY AT BEDTIME  . insulin NPH-regular Human (NOVOLIN 70/30) (70-30) 100 UNIT/ML injection Inject 20 Units into the skin 2 (two) times daily with a meal.  . levothyroxine (SYNTHROID, LEVOTHROID) 88 MCG tablet Take 1 tablet (88 mcg total) by mouth daily.  . rosuvastatin (CRESTOR) 10 MG tablet Take 1 tablet (10 mg total) by mouth daily. Please keep upcoming appt with Dr. Harrington Challenger in February for future refills. Thank you  . triamterene-hydrochlorothiazide (MAXZIDE-25) 37.5-25 MG tablet Take 0.5 tablets by mouth daily.     Allergies:   Patient has no known allergies.   Past Medical History:  Diagnosis Date  . Coronary artery disease involving native coronary artery of native heart without angina pectoris 11/01/2007   Last Cath '04: LAD w/ 60% long lesion; 1st Dx 70% ostial; 2nd Dx  70% ostial; Cx 30% ostial; RCA-dominant multiple 30-40% lesions;  PDA 30% lesions  Last nuclear stress March '09 - negative for ischemia   . Diabetes mellitus   . Diabetic retinopathy   . Hearing loss   . Hemochromatosis   . Hyperlipidemia   . Hypertension     Past Surgical History:  Procedure Laterality Date  . 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 INNER EAR SURGERY     right     Social History:  The patient  reports that he has never smoked. He has never used smokeless tobacco. He reports current alcohol use. He reports that he does not use drugs.   Family History:  The patient's family history includes Alcohol abuse in his father; Asthma in his mother; Cancer in his father; Colon cancer in his maternal uncle; Emphysema in his mother; Heart failure in his mother; Stroke in his father.    ROS:  Please see the history of present illness. All other systems are reviewed and  Negative to the above problem except as noted.    PHYSICAL EXAM: VS:  BP (!) 142/60   Pulse 83   Ht 6\' 2"  (1.88 m)   Wt 169  lb 1.9 oz (76.7 kg)   BMI 21.71 kg/m    GEN: Well nourished, well developed, in no acute distress  HEENT: normal  Neck: JVP is normal   No carotid bruits Cardiac: RRR; no murmurs, rubs, or gallops,no edema  Respiratory:  clear to auscultation bilaterally, normal work of breathing GI: soft, nontender, nondistended, + BS  No hepatomegaly  MS: no deformit Skin: warm and dry, no rash Neuro:  Strength and sensation are intact Psych: euthymic mood, full affect   EKG:  EKG is not ordered today.  SR 83 bpm   IWMI  Septal MI   Lipid Panel    Component Value Date/Time   CHOL 111 11/23/2017 0952   TRIG 87.0 11/23/2017 0952   HDL 44.90 11/23/2017 0952   CHOLHDL 2 11/23/2017 0952   VLDL 17.4 11/23/2017 0952   LDLCALC 49 11/23/2017 0952      Wt Readings from Last 3 Encounters:  07/20/18 169 lb  1.9 oz (76.7 kg)  07/03/18 171 lb (77.6 kg)  02/21/18 175 lb 12 oz (79.7 kg)      ASSESSMENT AND PLAN:  1  CAD  No symtoms of angina  Active  Continue to follow    2  HTN   BP is fair  Continue to follow   He is very active     3  HL  Keep on Crestor LDL in July 2019 was 55   2  Hx bradycardia   Asympotmatic 5.  DM   Lasat A1C  Was 8.4!!   Pt needs tighter control   Discussed diet   Followed by Dr Ronnald Ramp      F/u next winter    Current medicines are reviewed at length with the patient today.  The patient does not have concerns regarding medicines.  Signed, Dorris Carnes, MD  07/20/2018 10:09 AM    [facility] [address], [place], [place]  [address] Phone: [phone]; Fax: [phone]

## 2018-07-20 NOTE — Patient Instructions (Addendum)
Medication Instructions:  No changes If you need a refill on your cardiac medications before your next appointment, please call your pharmacy.   Lab work: none If you have labs (blood work) drawn today and your tests are completely normal, you will receive your results only by: Marland Kitchen MyChart Message (if you have MyChart) OR . A paper copy in the mail If you have any lab test that is abnormal or we need to change your treatment, we will call you to review the results.  Testing/Procedures: none  Follow-Up: At [facility], you and your health needs are our priority.  As part of our continuing mission to provide you with exceptional heart care, we have created designated Provider Care Teams.  These Care Teams include your primary Cardiologist (physician) and Advanced Practice Providers (APPs -  Physician Assistants and Nurse Practitioners) who all work together to provide you with the care you need, when you need it. You will need a follow up appointment in:  10 months.  Please call our office 2 months in advance to schedule this appointment.  You may see Dorris Carnes, MD or one of the following Advanced Practice Providers on your designated Care Team: Richardson Dopp, PA-C [place], [place] . Daune Perch, NP  Any Other Special Instructions Will Be Listed Below (If Applicable). Please contact your PCP, Dr. Ronnald Ramp to make an appointment to discuss your HgA1C.

## 2018-07-27 DIAGNOSIS — E039 Hypothyroidism, unspecified: Secondary | ICD-10-CM | POA: Diagnosis not present

## 2018-07-27 DIAGNOSIS — I10 Essential (primary) hypertension: Secondary | ICD-10-CM | POA: Diagnosis not present

## 2018-07-27 DIAGNOSIS — E109 Type 1 diabetes mellitus without complications: Secondary | ICD-10-CM | POA: Diagnosis not present

## 2018-07-27 DIAGNOSIS — E1140 Type 2 diabetes mellitus with diabetic neuropathy, unspecified: Secondary | ICD-10-CM | POA: Diagnosis not present

## 2018-07-27 DIAGNOSIS — E7800 Pure hypercholesterolemia, unspecified: Secondary | ICD-10-CM | POA: Diagnosis not present

## 2018-07-27 DIAGNOSIS — E11319 Type 2 diabetes mellitus with unspecified diabetic retinopathy without macular edema: Secondary | ICD-10-CM | POA: Diagnosis not present

## 2018-12-30 ENCOUNTER — Other Ambulatory Visit: Payer: Self-pay | Admitting: Internal Medicine

## 2018-12-30 DIAGNOSIS — E039 Hypothyroidism, unspecified: Secondary | ICD-10-CM

## 2019-01-04 ENCOUNTER — Other Ambulatory Visit: Payer: Self-pay | Admitting: Internal Medicine

## 2019-01-04 DIAGNOSIS — E039 Hypothyroidism, unspecified: Secondary | ICD-10-CM

## 2019-01-07 DIAGNOSIS — H2513 Age-related nuclear cataract, bilateral: Secondary | ICD-10-CM | POA: Diagnosis not present

## 2019-01-07 DIAGNOSIS — Z794 Long term (current) use of insulin: Secondary | ICD-10-CM | POA: Diagnosis not present

## 2019-01-07 DIAGNOSIS — E109 Type 1 diabetes mellitus without complications: Secondary | ICD-10-CM | POA: Diagnosis not present

## 2019-01-07 DIAGNOSIS — Z9889 Other specified postprocedural states: Secondary | ICD-10-CM | POA: Diagnosis not present

## 2019-01-10 LAB — HM DIABETES EYE EXAM

## 2019-01-18 ENCOUNTER — Telehealth: Payer: Self-pay | Admitting: Internal Medicine

## 2019-01-18 NOTE — Telephone Encounter (Signed)
Patient is calling to review screening questions for his appt on Monday 01/21/2019

## 2019-01-20 ENCOUNTER — Other Ambulatory Visit: Payer: Self-pay | Admitting: Internal Medicine

## 2019-01-20 DIAGNOSIS — I10 Essential (primary) hypertension: Secondary | ICD-10-CM

## 2019-01-21 ENCOUNTER — Encounter: Payer: Self-pay | Admitting: Internal Medicine

## 2019-01-21 ENCOUNTER — Ambulatory Visit (INDEPENDENT_AMBULATORY_CARE_PROVIDER_SITE_OTHER): Payer: Medicare Other | Admitting: Internal Medicine

## 2019-01-21 ENCOUNTER — Other Ambulatory Visit: Payer: Self-pay

## 2019-01-21 ENCOUNTER — Other Ambulatory Visit (INDEPENDENT_AMBULATORY_CARE_PROVIDER_SITE_OTHER): Payer: Medicare Other

## 2019-01-21 VITALS — BP 156/66 | HR 78 | Temp 97.8°F | Resp 16 | Ht 74.0 in | Wt 170.2 lb

## 2019-01-21 DIAGNOSIS — E1051 Type 1 diabetes mellitus with diabetic peripheral angiopathy without gangrene: Secondary | ICD-10-CM

## 2019-01-21 DIAGNOSIS — E785 Hyperlipidemia, unspecified: Secondary | ICD-10-CM

## 2019-01-21 DIAGNOSIS — I70209 Unspecified atherosclerosis of native arteries of extremities, unspecified extremity: Secondary | ICD-10-CM | POA: Diagnosis not present

## 2019-01-21 DIAGNOSIS — N182 Chronic kidney disease, stage 2 (mild): Secondary | ICD-10-CM | POA: Diagnosis not present

## 2019-01-21 DIAGNOSIS — E1065 Type 1 diabetes mellitus with hyperglycemia: Secondary | ICD-10-CM | POA: Diagnosis not present

## 2019-01-21 DIAGNOSIS — E039 Hypothyroidism, unspecified: Secondary | ICD-10-CM | POA: Diagnosis not present

## 2019-01-21 DIAGNOSIS — E103599 Type 1 diabetes mellitus with proliferative diabetic retinopathy without macular edema, unspecified eye: Secondary | ICD-10-CM | POA: Diagnosis not present

## 2019-01-21 DIAGNOSIS — I10 Essential (primary) hypertension: Secondary | ICD-10-CM

## 2019-01-21 DIAGNOSIS — N2889 Other specified disorders of kidney and ureter: Secondary | ICD-10-CM

## 2019-01-21 DIAGNOSIS — IMO0002 Reserved for concepts with insufficient information to code with codable children: Secondary | ICD-10-CM

## 2019-01-21 LAB — HEPATIC FUNCTION PANEL
ALT: 28 U/L (ref 0–53)
AST: 20 U/L (ref 0–37)
Albumin: 4.5 g/dL (ref 3.5–5.2)
Alkaline Phosphatase: 121 U/L — ABNORMAL HIGH (ref 39–117)
Bilirubin, Direct: 0.1 mg/dL (ref 0.0–0.3)
Total Bilirubin: 0.8 mg/dL (ref 0.2–1.2)
Total Protein: 7.5 g/dL (ref 6.0–8.3)

## 2019-01-21 LAB — LIPID PANEL
Cholesterol: 108 mg/dL (ref 0–200)
HDL: 41.6 mg/dL (ref >39.00)
LDL Cholesterol: 41 mg/dL (ref 0–99)
NonHDL: 66.79
Total CHOL/HDL Ratio: 3
Triglycerides: 127 mg/dL (ref 0.0–149.0)
VLDL: 25.4 mg/dL (ref 0.0–40.0)

## 2019-01-21 LAB — BASIC METABOLIC PANEL
BUN: 19 mg/dL (ref 6–23)
CO2: 29 mEq/L (ref 19–32)
Calcium: 9.7 mg/dL (ref 8.4–10.5)
Chloride: 101 mEq/L (ref 96–112)
Creatinine, Ser: 1.3 mg/dL (ref 0.40–1.50)
GFR: 54.9 mL/min — ABNORMAL LOW (ref >60.00)
Glucose, Bld: 64 mg/dL — ABNORMAL LOW (ref 70–99)
Potassium: 4.3 mEq/L (ref 3.5–5.1)
Sodium: 140 mEq/L (ref 135–145)

## 2019-01-21 LAB — URINALYSIS, ROUTINE W REFLEX MICROSCOPIC
Bilirubin Urine: NEGATIVE
Hgb urine dipstick: NEGATIVE
Ketones, ur: NEGATIVE
Leukocytes,Ua: NEGATIVE
Nitrite: NEGATIVE
RBC / HPF: NONE SEEN (ref 0–?)
Specific Gravity, Urine: 1.01 (ref 1.000–1.030)
Total Protein, Urine: NEGATIVE
Urine Glucose: NEGATIVE
Urobilinogen, UA: 0.2 (ref 0.0–1.0)
WBC, UA: NONE SEEN (ref 0–?)
pH: 7 (ref 5.0–8.0)

## 2019-01-21 LAB — MICROALBUMIN / CREATININE URINE RATIO
Creatinine,U: 67.7 mg/dL
Microalb Creat Ratio: 1.1 mg/g (ref 0.0–30.0)
Microalb, Ur: 0.7 mg/dL (ref 0.0–1.9)

## 2019-01-21 LAB — TSH: TSH: 0.82 u[IU]/mL (ref 0.35–4.50)

## 2019-01-21 LAB — CBC WITH DIFFERENTIAL/PLATELET
Basophils Absolute: 0.1 10*3/uL (ref 0.0–0.1)
Basophils Relative: 0.9 % (ref 0.0–3.0)
Eosinophils Absolute: 0.6 10*3/uL (ref 0.0–0.7)
Eosinophils Relative: 7.2 % — ABNORMAL HIGH (ref 0.0–5.0)
HCT: 45.2 % (ref 39.0–52.0)
Hemoglobin: 15.2 g/dL (ref 13.0–17.0)
Lymphocytes Relative: 25.2 % (ref 12.0–46.0)
Lymphs Abs: 2 10*3/uL (ref 0.7–4.0)
MCHC: 33.5 g/dL (ref 30.0–36.0)
MCV: 94.4 fl (ref 78.0–100.0)
Monocytes Absolute: 0.7 10*3/uL (ref 0.1–1.0)
Monocytes Relative: 8.8 % (ref 3.0–12.0)
Neutro Abs: 4.6 10*3/uL (ref 1.4–7.7)
Neutrophils Relative %: 57.9 % (ref 43.0–77.0)
Platelets: 213 10*3/uL (ref 150.0–400.0)
RBC: 4.79 Mil/uL (ref 4.22–5.81)
RDW: 13.6 % (ref 11.5–15.5)
WBC: 7.9 10*3/uL (ref 4.0–10.5)

## 2019-01-21 LAB — IBC PANEL
Iron: 211 ug/dL — ABNORMAL HIGH (ref 42–165)
Saturation Ratios: 87.1 % — ABNORMAL HIGH (ref 20.0–50.0)
Transferrin: 173 mg/dL — ABNORMAL LOW (ref 212.0–360.0)

## 2019-01-21 LAB — FERRITIN: Ferritin: 127.4 ng/mL (ref 22.0–322.0)

## 2019-01-21 LAB — HEMOGLOBIN A1C: Hgb A1c MFr Bld: 9 % — ABNORMAL HIGH (ref 4.6–6.5)

## 2019-01-21 MED ORDER — EDARBI 80 MG PO TABS: 1.0000 | ORAL_TABLET | Freq: Every day | ORAL | 1 refills | Status: DC

## 2019-01-21 MED ORDER — IRBESARTAN 150 MG PO TABS: 150.0000 mg | ORAL_TABLET | Freq: Every day | ORAL | 1 refills | Status: DC

## 2019-01-21 NOTE — Progress Notes (Signed)
Subjective:  Patient ID: Logan [name], male    DOB: 07-Apr-1951  Age: 68 y.o. MRN: [id number]  CC: Hypertension, Hypothyroidism, Diabetes, and Hyperlipidemia   HPI Logan [name] presents for f/up - By his estimation he tells me his blood sugars have been well controlled.  He denies polys.  He recently ran out of amlodipine and edarbi.  He is concerned his blood pressure is not well controlled.  He is active and denies any recent episodes of CP, DOE, palpitations, edema, or fatigue.  Outpatient Medications Prior to Visit  Medication Sig Dispense Refill  . amLODipine (NORVASC) 10 MG tablet Take 1 tablet by mouth once daily 90 tablet 1  . aspirin 81 MG tablet Take 81 mg by mouth 2 (two) times daily.     Marland Kitchen glucose blood (FREESTYLE LITE) test strip Use TID 300 each 1  . insulin glargine (LANTUS) 100 UNIT/ML injection INJECT 0.2MLS INTO THE SKIN ONCE DAILY AT BEDTIME 30 mL 2  . insulin NPH-regular Human (NOVOLIN 70/30) (70-30) 100 UNIT/ML injection Inject 20 Units into the skin 2 (two) times daily with a meal. 30 mL 11  . levothyroxine (SYNTHROID) 88 MCG tablet Take 1 tablet by mouth once daily 90 tablet 0  . rosuvastatin (CRESTOR) 10 MG tablet Take 1 tablet (10 mg total) by mouth daily. 90 tablet 3  . triamterene-hydrochlorothiazide (MAXZIDE-25) 37.5-25 MG tablet Take 0.5 tablets by mouth daily. 45 tablet 3  . Azilsartan Medoxomil (EDARBI) 80 MG TABS Take 1 tablet (80 mg total) by mouth daily. 90 tablet 1   No facility-administered medications prior to visit.     ROS Review of Systems  Constitutional: Negative.  Negative for appetite change, diaphoresis and fatigue.  HENT: Negative.   Eyes: Negative for visual disturbance.  Respiratory: Negative for cough, chest tightness, shortness of breath and wheezing.   Cardiovascular: Negative for chest pain, palpitations and leg swelling.  Gastrointestinal: Negative for abdominal pain, constipation, diarrhea, nausea and vomiting.  Endocrine:  Negative.  Negative for cold intolerance, heat intolerance, polydipsia, polyphagia and polyuria.  Genitourinary: Negative.  Negative for difficulty urinating.  Musculoskeletal: Negative.  Negative for arthralgias and myalgias.  Skin: Negative.  Negative for color change and pallor.  Neurological: Negative.  Negative for dizziness, weakness and light-headedness.  Hematological: Negative for adenopathy. Does not bruise/bleed easily.  Psychiatric/Behavioral: Negative.     Objective:  BP (!) 156/66 (BP Location: Left Arm, Patient Position: Sitting, Cuff Size: Normal)   Pulse 78   Temp 97.8 F (36.6 C) (Oral)   Resp 16   Ht 6\' 2"  (1.88 m)   Wt 170 lb 4 oz (77.2 kg)   SpO2 97%   BMI 21.86 kg/m   BP Readings from Last 3 Encounters:  01/21/19 (!) 156/66  07/20/18 (!) 142/60  07/03/18 (!) 132/58    Wt Readings from Last 3 Encounters:  01/21/19 170 lb 4 oz (77.2 kg)  07/20/18 169 lb 1.9 oz (76.7 kg)  07/03/18 171 lb (77.6 kg)    Physical Exam Vitals signs reviewed.  Constitutional:      Appearance: He is not ill-appearing or diaphoretic.  HENT:     Nose: Nose normal. No congestion or rhinorrhea.     Mouth/Throat:     Mouth: Mucous membranes are moist.  Eyes:     General: No scleral icterus.    Conjunctiva/sclera: Conjunctivae normal.  Neck:     Musculoskeletal: Normal range of motion. No neck rigidity or muscular tenderness.  Cardiovascular:  Rate and Rhythm: Normal rate and regular rhythm.     Heart sounds: No murmur.  Pulmonary:     Effort: Pulmonary effort is normal. No respiratory distress.     Breath sounds: No stridor. No wheezing, rhonchi or rales.  Abdominal:     General: Abdomen is flat. Bowel sounds are normal. There is no distension.     Palpations: There is no hepatomegaly or splenomegaly.     Tenderness: There is no abdominal tenderness.  Musculoskeletal: Normal range of motion.     Right lower leg: No edema.     Left lower leg: No edema.   Lymphadenopathy:     Cervical: No cervical adenopathy.  Skin:    General: Skin is warm and dry.     Coloration: Skin is not pale.  Neurological:     General: No focal deficit present.     Mental Status: He is alert and oriented to person, place, and time. Mental status is at baseline.     Lab Results  Component Value Date   WBC 7.9 01/21/2019   HGB 15.2 01/21/2019   HCT 45.2 01/21/2019   PLT 213.0 01/21/2019   GLUCOSE 64 (L) 01/21/2019   CHOL 108 01/21/2019   TRIG 127.0 01/21/2019   HDL 41.60 01/21/2019   LDLCALC 41 01/21/2019   ALT 28 01/21/2019   AST 20 01/21/2019   NA 140 01/21/2019   K 4.3 01/21/2019   CL 101 01/21/2019   CREATININE 1.30 01/21/2019   BUN 19 01/21/2019   CO2 29 01/21/2019   TSH 0.82 01/21/2019   PSA 3.32 07/03/2018   HGBA1C 9.0 (H) 01/21/2019   MICROALBUR 0.7 01/21/2019    Ct Temporal Bones W/o Cm  Result Date: 07/18/2014 CLINICAL DATA:  BILATERAL chronic otitis media with severe hearing loss. EXAM: CT TEMPORAL BONES WITHOUT CONTRAST TECHNIQUE: Axial and coronal plane CT imaging of the petrous temporal bones was performed with thin-collimation image reconstruction. No intravenous contrast was administered. Multiplanar CT image reconstructions were also generated. COMPARISON:  None. FINDINGS: The RIGHT tympanic membrane is silhouetted by fluid and/or soft tissue in the RIGHT middle ear and fluid/soft tissue in the bony external canal, lateral to the ear drum. The osseous margins of the bony external canal appears slightly irregular, although the scutum is intact. There is marked disruption of the ossicular chain on the RIGHT. No recognizable malleo-incudal joint. Probable displacement of the stapes away from the oval window. There is soft tissue in the attic and thinning of the tegmen tympani. There is widening of the aditus ad antrum, coalescence of the mastoid air cells, and thinning or dehiscence of the tegmen mastoideum, all consistent with chronic and  widespread cholesteatoma. RIGHT inner ear structures intact without take down of the bony margins of the semicircular canals. On the LEFT, there is similar but less severe disease. The external canal is unremarkable. LEFT TM is retracted. Anatomic ossicular anatomy is observed without erosion or displacement of the malleus, incus, or stapes. Within the LEFT middle ear cavity, there is soft tissue/fluid inferiorly, but the attic remains aerated with normal-appearing tegmen tympani. Slight widening of the LEFT aditus ad antrum and coalescence of LEFT mastoid air cells but intact tegmen mastoideum. Normal LEFT inner ear structures. Unremarkable appearing noncontrast intracranial compartment. Normal visualized TMJs and extracranial soft tissues. No sinus air-fluid level is evident. IMPRESSION: Severe BILATERAL chronic otitis media with BILATERAL middle ear and mastoid cholesteatomas, RIGHT much worse than LEFT. Right-sided ossicular disruption would be expected to contribute  to severe or near-complete conductive hearing loss. Similar/less severe changes on the LEFT. Dehiscence of the tegmen tympani and tegmen mastoideum on the RIGHT is suspected. Within limits of visualization on noncontrast exam, no visible intracranial complication. Electronically Signed   By: Rolla Flatten M.D.   On: 07/18/2014 15:34    Assessment & Plan:   Logan [name] was seen today for hypertension, hypothyroidism, diabetes and hyperlipidemia.  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  His blood sugars are not adequately well controlled.  I have asked him to follow-up with endocrinology and diabetic education and to be more compliant with his insulin regimen. -     Hemoglobin A1c; Future -     Microalbumin / creatinine urine ratio; Future -     HM Diabetes Foot Exam -     Discontinue: Azilsartan Medoxomil (EDARBI) 80 MG TABS; Take 1 tablet  (80 mg total) by mouth daily. -     irbesartan (AVAPRO) 150 MG tablet; Take 1 tablet (150 mg total) by mouth daily.  Essential hypertension- His blood pressure is not adequately well controlled.  I have asked him to restart an ARB. -     CBC with Differential/Platelet; Future -     Urinalysis, Routine w reflex microscopic; Future -     Basic metabolic panel; Future -     Discontinue: Azilsartan Medoxomil (EDARBI) 80 MG TABS; Take 1 tablet (80 mg total) by mouth daily. -     irbesartan (AVAPRO) 150 MG tablet; Take 1 tablet (150 mg total) by mouth daily.  Chronic renal impairment, stage 2 (mild)- His renal function is stable.  He agrees to avoid nephrotoxic agents.  Will restart an ARB. -     Urinalysis, Routine w reflex microscopic; Future -     Basic metabolic panel; Future -     irbesartan (AVAPRO) 150 MG tablet; Take 1 tablet (150 mg total) by mouth daily.  Hyperlipidemia LDL goal <70- He has achieved his LDL goal and is doing well on the statin. -     Lipid panel; Future -     Hepatic function panel; Future  Acquired hypothyroidism- His TSH is in the normal range.  He will remain on the current dose of levothyroxine. 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  Hemochromatosis, hereditary ([place])- He has a slightly elevated iron level.  His H&H are normal.  I have asked him to avoid foods that are rich in iron. -     IBC panel; Future -     Ferritin; Future   I have discontinued [facility]'s Azilsartan Medoxomil and Edarbi. I am also having him start on irbesartan. Additionally, I am having him maintain his aspirin, insulin NPH-regular Human, insulin glargine, glucose blood, rosuvastatin,  triamterene-hydrochlorothiazide, levothyroxine, and amLODipine.  Meds ordered this encounter  Medications  . DISCONTD: Azilsartan Medoxomil (EDARBI) 80 MG TABS    Sig: Take 1 tablet (80 mg total) by mouth daily.    Dispense:  90 tablet    Refill:  1  . irbesartan (  AVAPRO) 150 MG tablet    Sig: Take 1 tablet (150 mg total) by mouth daily.    Dispense:  90 tablet    Refill:  1     Follow-up: Return in about 3 months (around 04/23/2019).  Scarlette Calico, MD

## 2019-01-21 NOTE — Patient Instructions (Signed)

## 2019-01-23 ENCOUNTER — Other Ambulatory Visit: Payer: Self-pay

## 2019-01-23 NOTE — Patient Outreach (Signed)
[place] [facility]) Care Management  01/23/2019  Logan [name] Aug 26, 1950 [id number]  TELEPHONE SCREENING Referral date: 01/22/19 Referral source: primary MD  Referral reason:  Diabetes  Insurance: Medicare  Telephone call to patient regarding primary MD referral. HIPAA verified. RNCM explained reason for call.and discussed/ offered [facility] care management services.   Social: Patient states he independent with his care and lives alone. He states he is very active. He hunts in the fall/winter and he is a Doctor, general practice  Conditions: Patient reports he has been a Type 1 Diabetic since childhood.  Patient states his fasting blood sugars run below 100.  Patient states he has some recent fasting blood sugars  Were 53, 67, 62. Patient reports in the past few weeks he had a blood sugar of 28 in the evening.  Patient states he keeps candy and snacks with him for low blood sugars. Patient states he takes spoonful or two of his honey which causes his blood sugar to increase when it is to low.  Patient states he does not experience hypoglycemic symptoms like he use to.  He states he does not experience shakes and sweating when his blood sugars get low.  Patient states his most recent A1c as of 01/21/19 was 9.2. Patient states he saw a nutritionist in February 2020 and she checked his A1 c with a fingerstick and he was told it was 6.7. Patient states he does not understand why there would be such a difference.   RNCM reviewed patients A1c blood results with him from 01/21/19, 6 months prior and 11 months prior.  Patient states he tried a insulin pump in the past but it did not work out for him.  Patient states he is having some trouble with his blood pressure. Patient states his doctor adjusted his blood pressure medications on 01/21/19.  He reports his blood pressures have been running low such as 114/59, 125/58, 120/50, 115/64.  Patient states, "this is to low for me.  I feel better when my blood sugar is  around 130/60.  Patient states he does not feel right when his blood pressures are that low.  He said it feels like he is " walking up hill."  RNCM discussed and offered [facility] care management services. Patient verbally agreed.  Patient reports he is 6 ft 2 in and 170 lbs.   Medications: Patient states he takes his medications as prescribed and can afford them.   Appointments: Patient reports last primary MD visit 01/21/19  Advance Directives: patient states he has a health care power of attorney and living will   Consent: patient verbally consented to [facility] care management services.    PLAN:  RNCM will refer patient to health coach    Quinn Plowman RN,BSN,CCM  [facility] Telephonic  [phone]

## 2019-01-25 ENCOUNTER — Other Ambulatory Visit: Payer: Self-pay | Admitting: *Deleted

## 2019-02-13 DIAGNOSIS — E10621 Type 1 diabetes mellitus with foot ulcer: Secondary | ICD-10-CM | POA: Diagnosis not present

## 2019-02-13 DIAGNOSIS — L89622 Pressure ulcer of left heel, stage 2: Secondary | ICD-10-CM | POA: Diagnosis not present

## 2019-02-13 DIAGNOSIS — L97522 Non-pressure chronic ulcer of other part of left foot with fat layer exposed: Secondary | ICD-10-CM | POA: Diagnosis not present

## 2019-02-14 ENCOUNTER — Other Ambulatory Visit: Payer: Self-pay | Admitting: *Deleted

## 2019-02-14 NOTE — Patient Outreach (Signed)
[place] [facility]) Care Management  02/14/2019  YAHEL LIMING 06/18/1950 [id number]   RN Health Coach Initial Assessment  Referral Date:  01/23/2019 Referral Source:  Primary MD Reason for Referral:  Disease Management Education Insurance:  Medicare   Outreach Attempt:  Outreach attempt #1 to patient for introduction and initial telephone assessment. No answer. RN Health Coach left HIPAA compliant voicemail message along with contact information.  Plan:  RN Health Coach will make another outreach attempt within the month of September.   [place] [phone] [email or web address]

## 2019-02-18 ENCOUNTER — Other Ambulatory Visit: Payer: Self-pay | Admitting: Internal Medicine

## 2019-02-22 DIAGNOSIS — E7800 Pure hypercholesterolemia, unspecified: Secondary | ICD-10-CM | POA: Diagnosis not present

## 2019-02-22 DIAGNOSIS — E039 Hypothyroidism, unspecified: Secondary | ICD-10-CM | POA: Diagnosis not present

## 2019-02-22 DIAGNOSIS — Z23 Encounter for immunization: Secondary | ICD-10-CM | POA: Diagnosis not present

## 2019-02-22 DIAGNOSIS — E109 Type 1 diabetes mellitus without complications: Secondary | ICD-10-CM | POA: Diagnosis not present

## 2019-02-27 DIAGNOSIS — E10621 Type 1 diabetes mellitus with foot ulcer: Secondary | ICD-10-CM | POA: Diagnosis not present

## 2019-02-27 DIAGNOSIS — L89622 Pressure ulcer of left heel, stage 2: Secondary | ICD-10-CM | POA: Diagnosis not present

## 2019-02-27 DIAGNOSIS — E11621 Type 2 diabetes mellitus with foot ulcer: Secondary | ICD-10-CM | POA: Diagnosis not present

## 2019-02-27 DIAGNOSIS — L97522 Non-pressure chronic ulcer of other part of left foot with fat layer exposed: Secondary | ICD-10-CM | POA: Diagnosis not present

## 2019-03-01 DIAGNOSIS — I10 Essential (primary) hypertension: Secondary | ICD-10-CM | POA: Diagnosis not present

## 2019-03-01 DIAGNOSIS — E7800 Pure hypercholesterolemia, unspecified: Secondary | ICD-10-CM | POA: Diagnosis not present

## 2019-03-01 DIAGNOSIS — E109 Type 1 diabetes mellitus without complications: Secondary | ICD-10-CM | POA: Diagnosis not present

## 2019-03-01 DIAGNOSIS — E039 Hypothyroidism, unspecified: Secondary | ICD-10-CM | POA: Diagnosis not present

## 2019-03-04 ENCOUNTER — Encounter: Payer: Self-pay | Admitting: Internal Medicine

## 2019-03-04 ENCOUNTER — Other Ambulatory Visit: Payer: Self-pay | Admitting: *Deleted

## 2019-03-04 NOTE — Patient Outreach (Signed)
[place] [facility]) Care Management  03/04/2019  ALMAN NULL 19-Mar-1951 [id number]   RN Health Coach Initial Assessment  Referral Date:  01/23/2019 Referral Source:  Primary MD Reason for Referral:  Disease Management Education Insurance:  Medicare   Outreach Attempt:  Outreach attempt #2 to patient for introduction and initial telephone assessment. No answer. RN Health Coach left HIPAA compliant voicemail message along with contact information.  Plan:  RN Health Coach will make another outreach attempt within the month of October.  RN Health Coach will send patient Unsuccessful Outreach Letter.  [place] [phone] [email or web address]

## 2019-03-13 DIAGNOSIS — E11621 Type 2 diabetes mellitus with foot ulcer: Secondary | ICD-10-CM | POA: Diagnosis not present

## 2019-03-13 DIAGNOSIS — L97522 Non-pressure chronic ulcer of other part of left foot with fat layer exposed: Secondary | ICD-10-CM | POA: Diagnosis not present

## 2019-03-13 DIAGNOSIS — L89622 Pressure ulcer of left heel, stage 2: Secondary | ICD-10-CM | POA: Diagnosis not present

## 2019-03-13 DIAGNOSIS — E10621 Type 1 diabetes mellitus with foot ulcer: Secondary | ICD-10-CM | POA: Diagnosis not present

## 2019-03-25 ENCOUNTER — Other Ambulatory Visit: Payer: Self-pay | Admitting: *Deleted

## 2019-03-25 NOTE — Patient Outreach (Signed)
[facility]) Care Management  03/25/2019  Logan [name] [birth year] [id number]   RN Health Coach Initial Assessment  Referral Date:01/23/2019 Referral Source:Primary MD Reason for Referral:Disease Management Education Insurance:Medicare   Outreach Attempt:  Outreach attempt #3 to patient for introduction and initial telephone assessemnt. No answer. RN Health Coach left HIPAA compliant voicemail message along with contact information.  Plan:  RN Health Coach will make another outreach attempt within the month of November.  [place] [phone] [name]

## 2019-04-25 ENCOUNTER — Other Ambulatory Visit: Payer: Self-pay | Admitting: *Deleted

## 2019-04-25 NOTE — Patient Outreach (Signed)
[facility]) Care Management  04/25/2019  GARNER VANHAITSMA 1951-05-25 [id number]   RN Health Coach Initial Assessment  Referral Date:01/23/2019 Referral Source:Primary MD Reason for Referral:Disease Management Education Insurance:Medicare   Outreach Attempt:  Outreach attempt #4 to patient for initial telephone assessment. No answer. RN Health Coach left HIPAA compliant voicemail message along with contact information.  Plan:  RN Health Coach will send unsuccessful outreach letter to patient.  RN Health Coach will make another outreach attempt to patient within the month of December if no return call back from patient.  [place] [phone] [email or web address]

## 2019-05-16 NOTE — Progress Notes (Signed)
Cardiology Office Note   Date:  05/17/2019   ID:  RUFE RUTKOWSKI, DOB [birth year], MRN [id number]  PCP:  Janith Lima, MD  Cardiologist:   Dorris Carnes, MD    F?U of CAD and HTN     History of Present Illness: Logan [name] is a 68 y.o. male with a history of history of CAD. 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 He had nonobstructive disease at catheterization in 2004. He had a negative Myoview scan in 2009 with an ejection fraction of 49%.  Pt has also had syncope in past Known bradycardia.  Pt was lasat seen in Feb 2020   since seen he has done well.  He denies chest pain he denies shortness of breath.  He is extremely active and hunts regularly.  No dizziness does note some unsteadiness on feet he does his blowing of leaves off the roof.  No falls.  Patient was seen by Dr. Michiel Sites earlier today his A1c is improved from earlier in the fall.  He is trying to watch sweets  Current Meds  Medication Sig  . amLODipine (NORVASC) 10 MG tablet Take 1 tablet by mouth once daily  . aspirin 81 MG tablet Take 81 mg by mouth 2 (two) times daily.   Marland Kitchen EDARBI 80 MG TABS TAKE ONE TABLET BY MOUTH DAILY   . glucose blood (FREESTYLE LITE) test strip Use TID  . insulin glargine (LANTUS) 100 UNIT/ML injection INJECT 0.2MLS INTO THE SKIN ONCE DAILY AT BEDTIME  . insulin NPH-regular Human (NOVOLIN 70/30) (70-30) 100 UNIT/ML injection Inject 20 Units into the skin 2 (two) times daily with a meal.  . irbesartan (AVAPRO) 150 MG tablet Take 1 tablet (150 mg total) by mouth daily.  Marland Kitchen levothyroxine (SYNTHROID) 88 MCG tablet Take 1 tablet by mouth once daily  . rosuvastatin (CRESTOR) 10 MG tablet Take 1 tablet (10 mg total) by mouth daily.  Marland Kitchen triamterene-hydrochlorothiazide (MAXZIDE-25) 37.5-25 MG tablet Take 0.5 tablets by mouth daily.     Allergies:   Patient has no known allergies.   Past Medical History:  Diagnosis Date  . Coronary artery disease involving native coronary artery  of native heart without angina pectoris 11/01/2007   Last Cath '04: LAD w/ 60% long lesion; 1st Dx 70% ostial; 2nd Dx  70% ostial; Cx 30% ostial; RCA-dominant multiple 30-40% lesions; PDA 30% lesions  Last nuclear stress March '09 - negative for ischemia   . Diabetes mellitus   . Diabetic retinopathy   . Hearing loss   . Hemochromatosis   . Hyperlipidemia   . Hypertension     Past Surgical History:  Procedure Laterality Date  . 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 INNER EAR SURGERY     right     Social History:  The patient  reports that he has never smoked. He has never used smokeless tobacco. He reports current alcohol use. He reports that he does not use drugs.   Family History:  The patient's family history includes Alcohol abuse in his father; Asthma in his mother; Cancer in his father; Colon cancer in his maternal uncle; Emphysema in his mother; Heart failure in his mother; Stroke in his father.    ROS:  Please see the history of present illness. All other systems are reviewed and  Negative to the above problem except as noted.    PHYSICAL EXAM: VS:  BP 130/60   Pulse 70   Ht 6\' 2"  (1.88 m)   Wt 171 lb 12.8 oz (77.9 kg)   SpO2 97%   BMI 22.06 kg/m    GEN: Thin 68 year old, in no acute distress  HEENT: normal  Neck: JVP is normal   No carotid bruits Cardiac: RRR; no murmurs, rubs, or gallops,no edema  Respiratory:  clear to auscultation bilaterally, normal work of breathing GI: soft, nontender, nondistended, + BS  No hepatomegaly   Skin: warm and dry, no rash Neuro:  Strength and sensation are intact Psych: euthymic mood, full affect   EKG:  EKG is not ordered today.  \   Lipid Panel    Component Value Date/Time   CHOL 108 01/21/2019 0836   TRIG 127.0 01/21/2019 0836   HDL 41.60 01/21/2019 0836   CHOLHDL 3  01/21/2019 0836   VLDL 25.4 01/21/2019 0836   LDLCALC 41 01/21/2019 0836      Wt Readings from Last 3 Encounters:  05/17/19 171 lb 12.8 oz (77.9 kg)  01/21/19 170 lb 4 oz (77.2 kg)  07/20/18 169 lb 1.9 oz (76.7 kg)      ASSESSMENT AND PLAN:  1  CAD patient remains asymptomatic he is very active.  Keep on current regimen follow  2  HTN   blood pressure is good  3  HL  Keep on Crestor LDL in August was 41 HDL 46 continue 2  Hx bradycardia   Asympotmatic patient's heart rate is okay today 5.  DM   patient said his last A1c is was 7.4.  He was six-point something in the spring.  Watch diet F/u 1 year sooner with problems.  Current medicines are reviewed at length with the patient today.  The patient does not have concerns regarding medicines.  Signed, Dorris Carnes, MD  05/17/2019 2:52 PM    [facility] [address], [place], [place]  [address] Phone: [phone]; Fax: [phone]

## 2019-05-17 ENCOUNTER — Other Ambulatory Visit: Payer: Self-pay

## 2019-05-17 ENCOUNTER — Ambulatory Visit (INDEPENDENT_AMBULATORY_CARE_PROVIDER_SITE_OTHER): Payer: Medicare Other | Admitting: Internal Medicine

## 2019-05-17 ENCOUNTER — Encounter: Payer: Self-pay | Admitting: Internal Medicine

## 2019-05-17 VITALS — BP 130/60 | HR 70 | Ht 74.0 in | Wt 171.8 lb

## 2019-05-17 DIAGNOSIS — I10 Essential (primary) hypertension: Secondary | ICD-10-CM | POA: Diagnosis not present

## 2019-05-17 DIAGNOSIS — E782 Mixed hyperlipidemia: Secondary | ICD-10-CM | POA: Diagnosis not present

## 2019-05-17 DIAGNOSIS — I2510 Atherosclerotic heart disease of native coronary artery without angina pectoris: Secondary | ICD-10-CM

## 2019-05-17 DIAGNOSIS — I70209 Unspecified atherosclerosis of native arteries of extremities, unspecified extremity: Secondary | ICD-10-CM | POA: Diagnosis not present

## 2019-05-17 DIAGNOSIS — E1051 Type 1 diabetes mellitus with diabetic peripheral angiopathy without gangrene: Secondary | ICD-10-CM

## 2019-05-17 NOTE — Patient Instructions (Signed)
Medication Instructions:  No changes *If you need a refill on your cardiac medications before your next appointment, please call your pharmacy*  Lab Work: none If you have labs (blood work) drawn today and your tests are completely normal, you will receive your results only by: Marland Kitchen MyChart Message (if you have MyChart) OR . A paper copy in the mail If you have any lab test that is abnormal or we need to change your treatment, we will call you to review the results.  Testing/Procedures: none  Follow-Up: At [facility], you and your health needs are our priority.  As part of our continuing mission to provide you with exceptional heart care, we have created designated Provider Care Teams.  These Care Teams include your primary Cardiologist (physician) and Advanced Practice Providers (APPs -  Physician Assistants and Nurse Practitioners) who all work together to provide you with the care you need, when you need it.  Your next appointment:   12 month(s)  The format for your next appointment:   In Person  Provider:   You may see Dorris Carnes, MD or one of the following Advanced Practice Providers on your designated Care Team:    Richardson Dopp, PA-C  [place], [place]  Daune Perch, NP   Other Instructions

## 2019-05-29 ENCOUNTER — Other Ambulatory Visit: Payer: Self-pay | Admitting: *Deleted

## 2019-05-29 NOTE — Patient Outreach (Signed)
[facility]) Care Management  05/29/2019  JORIS ARRUE Jul 03, 1950 [id number]   RN Health Coach Initial Assessment  Referral Date:01/23/2019 Referral Source:Primary MD Reason for Referral:Disease Management Education Insurance:Medicare   Outreach Attempt:  Outreach attempt #5 to patient for initial telephone assessment. No answer. RN Health Coach left HIPAA compliant voicemail message along with contact information.  Plan:  RN Health Coach will make another outreach attempt within the month of January if no return call back from patient.  [place] [phone] [email or web address]

## 2019-06-20 ENCOUNTER — Other Ambulatory Visit: Payer: Self-pay | Admitting: Internal Medicine

## 2019-06-23 ENCOUNTER — Other Ambulatory Visit: Payer: Self-pay | Admitting: Internal Medicine

## 2019-06-23 DIAGNOSIS — E039 Hypothyroidism, unspecified: Secondary | ICD-10-CM

## 2019-06-24 ENCOUNTER — Other Ambulatory Visit: Payer: Self-pay | Admitting: Internal Medicine

## 2019-06-24 DIAGNOSIS — E039 Hypothyroidism, unspecified: Secondary | ICD-10-CM

## 2019-06-24 MED ORDER — LEVOTHYROXINE SODIUM 88 MCG PO TABS: 88.0000 ug | ORAL_TABLET | Freq: Every day | ORAL | 0 refills | Status: DC

## 2019-07-01 ENCOUNTER — Other Ambulatory Visit: Payer: Self-pay | Admitting: *Deleted

## 2019-07-01 NOTE — Patient Outreach (Signed)
[place] [facility]) Care Management  07/01/2019  Logan [name] 12/09/1950 [id number]   RN Health Coach Initial Assessment  Referral Date:01/23/2019 Referral Source:Primary MD Reason for Referral:Disease Management Education Insurance:Medicare   Outreach Attempt:  Outreach attempt #6 to patient for initial telephone assessment. No answer. RN Health Coach left HIPAA compliant voicemail message along with contact information.  Plan:  RN Health Coach will make another outreach attempt within the month of February if no return call back from patient.  [place] [phone] [email or web address]

## 2019-07-02 ENCOUNTER — Other Ambulatory Visit: Payer: Self-pay | Admitting: *Deleted

## 2019-07-02 NOTE — Patient Outreach (Signed)
[facility]) Care Management  07/02/2019  Logan [name] [birth year] [id number]   RN Health Coach Initial Assessment  Referral Date:01/23/2019 Referral Source:Primary MD Reason for Referral:Disease Management Education Insurance:Medicare   Outreach Attempt:  Received return call from patient.  HIPAA verified with patient.  RN Health Coach introduced self and role. Patient stating he is very busy and hard to reach.  Is out of the home most days and lives a very active life style.  States his blood sugars are better controlled and his A1C is reduced to about 7.8.  Patient stating he has spoken with nutritionist at Endocrinologist office and is unsure if he wants to participate in the Disease Management program.  Does agree to receive program brochure and would like a call back in about 3 months to discuss program.  Logan [name] inquired about good time to contact patient and patient responded "12 o'clock midnight or 10 pm" but it was ok to leave message and he could return call.  Plan:  RN Health Coach will send patient Successful Outreach Letter with [place].  RN Health Coach will make another outreach attempt to patient within the month of April, per patient's request.  Logan Azure RN [place] [phone] [name]

## 2019-07-14 ENCOUNTER — Other Ambulatory Visit: Payer: Self-pay | Admitting: Internal Medicine

## 2019-07-14 DIAGNOSIS — I10 Essential (primary) hypertension: Secondary | ICD-10-CM

## 2019-07-21 ENCOUNTER — Other Ambulatory Visit: Payer: Self-pay | Admitting: Internal Medicine

## 2019-07-21 DIAGNOSIS — IMO0002 Reserved for concepts with insufficient information to code with codable children: Secondary | ICD-10-CM

## 2019-07-21 DIAGNOSIS — I70209 Unspecified atherosclerosis of native arteries of extremities, unspecified extremity: Secondary | ICD-10-CM

## 2019-07-21 DIAGNOSIS — I10 Essential (primary) hypertension: Secondary | ICD-10-CM

## 2019-07-21 DIAGNOSIS — E1051 Type 1 diabetes mellitus with diabetic peripheral angiopathy without gangrene: Secondary | ICD-10-CM

## 2019-07-21 DIAGNOSIS — N182 Chronic kidney disease, stage 2 (mild): Secondary | ICD-10-CM

## 2019-07-21 DIAGNOSIS — E103599 Type 1 diabetes mellitus with proliferative diabetic retinopathy without macular edema, unspecified eye: Secondary | ICD-10-CM

## 2019-07-31 ENCOUNTER — Ambulatory Visit: Payer: Self-pay | Admitting: *Deleted

## 2019-08-02 ENCOUNTER — Ambulatory Visit: Payer: Medicare Other | Attending: Internal Medicine

## 2019-08-02 DIAGNOSIS — Z23 Encounter for immunization: Secondary | ICD-10-CM | POA: Insufficient documentation

## 2019-08-27 ENCOUNTER — Ambulatory Visit: Payer: Medicare Other | Attending: Internal Medicine

## 2019-08-27 DIAGNOSIS — Z23 Encounter for immunization: Secondary | ICD-10-CM

## 2019-08-27 NOTE — Progress Notes (Signed)
   Covid-19 Vaccination Clinic  Name:  Logan [name]    MRN: [id number] DOB: 09/18/1950  08/27/2019  [name] was observed post Covid-19 immunization for 15 minutes without incident. He was provided with Vaccine Information Sheet and instruction to access the V-Safe system.   [name] was instructed to call 911 with any severe reactions post vaccine: Marland Kitchen Difficulty breathing  . Swelling of face and throat  . A fast heartbeat  . A bad rash all over body  . Dizziness and weakness   Immunizations Administered    Name Date Dose VIS Date Route   Pfizer COVID-19 Vaccine 08/27/2019 10:42 AM 0.3 mL 05/17/2019 Intramuscular   Manufacturer: [place]   Lot: [id number]   [place]: [id number]

## 2019-09-15 ENCOUNTER — Other Ambulatory Visit: Payer: Self-pay | Admitting: Internal Medicine

## 2019-09-16 ENCOUNTER — Other Ambulatory Visit: Payer: Self-pay | Admitting: *Deleted

## 2019-09-16 NOTE — Patient Outreach (Signed)
[facility]) Care Management  09/16/2019  Logan [name] 1951/01/31 [id number]   RN Health Coach Initial Assessment  Referral Date:01/23/2019 Referral Source:Primary MD Reason for Referral:Disease Management Education Insurance:Medicare   Outreach Attempt:  Outreach attempt #7 to patient for initial assessment. No answer. RN Health Coach left HIPAA compliant voicemail message along with contact information.  Plan:  RN Health Coach will make another outreach attempt within the month of May if no return call back from patient.  [place] [phone] [name]

## 2019-09-22 ENCOUNTER — Other Ambulatory Visit: Payer: Self-pay | Admitting: Internal Medicine

## 2019-09-22 DIAGNOSIS — E039 Hypothyroidism, unspecified: Secondary | ICD-10-CM

## 2019-09-27 DIAGNOSIS — H2513 Age-related nuclear cataract, bilateral: Secondary | ICD-10-CM | POA: Diagnosis not present

## 2019-09-29 ENCOUNTER — Other Ambulatory Visit: Payer: Self-pay | Admitting: Internal Medicine

## 2019-09-29 DIAGNOSIS — E039 Hypothyroidism, unspecified: Secondary | ICD-10-CM

## 2019-10-03 ENCOUNTER — Other Ambulatory Visit: Payer: Self-pay | Admitting: *Deleted

## 2019-10-03 ENCOUNTER — Encounter: Payer: Self-pay | Admitting: Internal Medicine

## 2019-10-03 ENCOUNTER — Other Ambulatory Visit: Payer: Self-pay

## 2019-10-03 ENCOUNTER — Ambulatory Visit (INDEPENDENT_AMBULATORY_CARE_PROVIDER_SITE_OTHER): Payer: Medicare Other | Admitting: Internal Medicine

## 2019-10-03 VITALS — BP 130/70 | HR 77 | Temp 98.2°F | Resp 16 | Ht 74.0 in | Wt 179.0 lb

## 2019-10-03 DIAGNOSIS — I70209 Unspecified atherosclerosis of native arteries of extremities, unspecified extremity: Secondary | ICD-10-CM | POA: Diagnosis not present

## 2019-10-03 DIAGNOSIS — E039 Hypothyroidism, unspecified: Secondary | ICD-10-CM

## 2019-10-03 DIAGNOSIS — N400 Enlarged prostate without lower urinary tract symptoms: Secondary | ICD-10-CM | POA: Diagnosis not present

## 2019-10-03 DIAGNOSIS — N182 Chronic kidney disease, stage 2 (mild): Secondary | ICD-10-CM

## 2019-10-03 DIAGNOSIS — E1051 Type 1 diabetes mellitus with diabetic peripheral angiopathy without gangrene: Secondary | ICD-10-CM | POA: Diagnosis not present

## 2019-10-03 LAB — URINALYSIS, ROUTINE W REFLEX MICROSCOPIC
Bilirubin Urine: NEGATIVE
Hgb urine dipstick: NEGATIVE
Ketones, ur: NEGATIVE
Leukocytes,Ua: NEGATIVE
Nitrite: NEGATIVE
RBC / HPF: NONE SEEN (ref 0–?)
Specific Gravity, Urine: <=1.005 — AB (ref 1.000–1.030)
Total Protein, Urine: NEGATIVE
Urine Glucose: NEGATIVE
Urobilinogen, UA: 0.2 (ref 0.0–1.0)
WBC, UA: NONE SEEN (ref 0–?)
pH: 7 (ref 5.0–8.0)

## 2019-10-03 LAB — BASIC METABOLIC PANEL
BUN: 28 mg/dL — ABNORMAL HIGH (ref 6–23)
CO2: 31 mEq/L (ref 19–32)
Calcium: 9.3 mg/dL (ref 8.4–10.5)
Chloride: 100 mEq/L (ref 96–112)
Creatinine, Ser: 1.42 mg/dL (ref 0.40–1.50)
GFR: 49.48 mL/min — ABNORMAL LOW (ref >60.00)
Glucose, Bld: 82 mg/dL (ref 70–99)
Potassium: 5 mEq/L (ref 3.5–5.1)
Sodium: 138 mEq/L (ref 135–145)

## 2019-10-03 LAB — PSA: PSA: 2.44 ng/mL (ref 0.10–4.00)

## 2019-10-03 LAB — TSH: TSH: 11.12 u[IU]/mL — ABNORMAL HIGH (ref 0.35–4.50)

## 2019-10-03 LAB — HEMOGLOBIN A1C: Hgb A1c MFr Bld: 9.2 % — ABNORMAL HIGH (ref 4.6–6.5)

## 2019-10-03 MED ORDER — LEVOTHYROXINE SODIUM 100 MCG PO TABS: 100.0000 ug | ORAL_TABLET | Freq: Every day | ORAL | 0 refills | Status: DC

## 2019-10-03 NOTE — Patient Outreach (Signed)
[facility]) Care Management  10/03/2019  Logan [name] [birth year] [id number]   RN Health Coach Initial Assessment  Referral Date:01/23/2019 Referral Source:Primary MD Reason for Referral:Disease Management Education Insurance:Medicare   Outreach Attempt:  Received provider referral for patient RN Health Coach already outreaching without success.  Attempted #8 outreach without success.  HIPAA compliant voicemail message left for patient with return call information.  Plan:  RN Health Coach will route this note with message to primary care provider.  RN Health Coach will make another outreach attempt to patient within the month of May as previously scheduled.  [place] [phone] [name]

## 2019-10-03 NOTE — Progress Notes (Signed)
Subjective:  Patient ID: Logan [name], male    DOB: June 28, 1950  Age: 69 y.o. MRN: [id number]  CC: Follow-up (6 month follow up), Hypothyroidism, Diabetes, and Hypertension  This visit occurred during the SARS-CoV-2 public health emergency.  Safety protocols were in place, including screening questions prior to the visit, additional usage of staff PPE, and extensive cleaning of exam room while observing appropriate contact time as indicated for disinfecting solutions.    HPI Logan [name] presents for f/up - He thinks his blood sugars have been well controlled over the last few months.  The highest blood sugar he can recall recently was 160 and the lowest was 51.  He denies polys.  He complains of chronic, unchanged lower extremity edema.  He denies chest pain, shortness of breath, palpitations, or fatigue.  He tells me he is compliant with the current dose of levothyroxine.  Outpatient Medications Prior to Visit  Medication Sig Dispense Refill  . amLODipine (NORVASC) 10 MG tablet Take 1 tablet by mouth once daily 90 tablet 1  . aspirin 81 MG tablet Take 81 mg by mouth 2 (two) times daily.     Marland Kitchen EDARBI 80 MG TABS TAKE ONE TABLET BY MOUTH DAILY  90 tablet 1  . glucose blood (FREESTYLE LITE) test strip Use TID 300 each 1  . insulin glargine (LANTUS) 100 UNIT/ML injection INJECT 0.2MLS INTO THE SKIN ONCE DAILY AT BEDTIME 30 mL 2  . insulin NPH-regular Human (NOVOLIN 70/30) (70-30) 100 UNIT/ML injection Inject 20 Units into the skin 2 (two) times daily with a meal. 30 mL 11  . irbesartan (AVAPRO) 150 MG tablet Take 1 tablet by mouth once daily 90 tablet 0  . ketorolac (ACULAR) 0.5 % ophthalmic solution Place 1 drop into the right eye 4 (four) times daily.    Marland Kitchen moxifloxacin (VIGAMOX) 0.5 % ophthalmic solution Place 1 drop into the right eye 4 (four) times daily.    . prednisoLONE acetate (PRED FORTE) 1 % ophthalmic suspension Place 1 drop into the right eye 4 (four) times daily.    .  rosuvastatin (CRESTOR) 10 MG tablet Take 1 tablet by mouth once daily 90 tablet 0  . triamterene-hydrochlorothiazide (MAXZIDE-25) 37.5-25 MG tablet Take 1/2 (one-half) tablet by mouth once daily 45 tablet 0  . levothyroxine (SYNTHROID) 88 MCG tablet Take 1 tablet (88 mcg total) by mouth daily. 90 tablet 0   No facility-administered medications prior to visit.    ROS Review of Systems  Constitutional: Negative for appetite change, diaphoresis, fatigue and unexpected weight change.  HENT: Negative.   Eyes: Negative.   Respiratory: Negative.  Negative for cough, chest tightness, shortness of breath and wheezing.   Cardiovascular: Positive for leg swelling. Negative for chest pain and palpitations.  Gastrointestinal: Negative for abdominal pain, blood in stool, constipation, diarrhea, nausea and vomiting.  Endocrine: Negative for cold intolerance and heat intolerance.  Genitourinary: Negative.  Negative for difficulty urinating.  Musculoskeletal: Negative.  Negative for arthralgias and myalgias.  Skin: Negative.  Negative for color change and pallor.  Neurological: Negative.  Negative for dizziness, weakness, light-headedness and headaches.  Hematological: Negative for adenopathy. Does not bruise/bleed easily.    Objective:  BP 130/70 (BP Location: Left Arm, Patient Position: Sitting, Cuff Size: Normal)   Pulse 77   Temp 98.2 F (36.8 C) (Oral)   Resp 16   Ht 6\' 2"  (1.88 m)   Wt 179 lb (81.2 kg)   SpO2 97%   BMI 22.98 kg/m  BP Readings from Last 3 Encounters:  10/03/19 130/70  05/17/19 130/60  01/21/19 (!) 156/66    Wt Readings from Last 3 Encounters:  10/03/19 179 lb (81.2 kg)  05/17/19 171 lb 12.8 oz (77.9 kg)  01/21/19 170 lb 4 oz (77.2 kg)    Physical Exam Vitals reviewed.  Constitutional:      Appearance: Normal appearance.  HENT:     Nose: Nose normal.     Mouth/Throat:     Mouth: Mucous membranes are moist.  Eyes:     General: No scleral icterus.     Conjunctiva/sclera: Conjunctivae normal.  Cardiovascular:     Rate and Rhythm: Normal rate and regular rhythm.     Heart sounds: No murmur.  Pulmonary:     Effort: Pulmonary effort is normal.     Breath sounds: No stridor. No wheezing, rhonchi or rales.  Abdominal:     General: Abdomen is flat. Bowel sounds are normal. There is no distension.     Palpations: Abdomen is soft. There is no hepatomegaly, splenomegaly or mass.     Tenderness: There is no abdominal tenderness.  Musculoskeletal:        General: Normal range of motion.     Cervical back: Neck supple.     Right lower leg: Edema (trace) present.     Left lower leg: Edema (trace) present.  Lymphadenopathy:     Cervical: No cervical adenopathy.  Skin:    General: Skin is warm and dry.     Coloration: Skin is not pale.  Neurological:     General: No focal deficit present.     Mental Status: He is alert.  Psychiatric:        Mood and Affect: Mood normal.        Behavior: Behavior normal.     Lab Results  Component Value Date   WBC 7.9 01/21/2019   HGB 15.2 01/21/2019   HCT 45.2 01/21/2019   PLT 213.0 01/21/2019   GLUCOSE 82 10/03/2019   CHOL 108 01/21/2019   TRIG 127.0 01/21/2019   HDL 41.60 01/21/2019   LDLCALC 41 01/21/2019   ALT 28 01/21/2019   AST 20 01/21/2019   NA 138 10/03/2019   K 5.0 10/03/2019   CL 100 10/03/2019   CREATININE 1.42 10/03/2019   BUN 28 (H) 10/03/2019   CO2 31 10/03/2019   TSH 11.12 (H) 10/03/2019   PSA 2.44 10/03/2019   HGBA1C 9.2 (H) 10/03/2019   MICROALBUR 0.7 01/21/2019    CT Temporal Bones W/O CM  Result Date: 07/18/2014 CLINICAL DATA:  BILATERAL chronic otitis media with severe hearing loss. EXAM: CT TEMPORAL BONES WITHOUT CONTRAST TECHNIQUE: Axial and coronal plane CT imaging of the petrous temporal bones was performed with thin-collimation image reconstruction. No intravenous contrast was administered. Multiplanar CT image reconstructions were also generated. COMPARISON:   None. FINDINGS: The RIGHT tympanic membrane is silhouetted by fluid and/or soft tissue in the RIGHT middle ear and fluid/soft tissue in the bony external canal, lateral to the ear drum. The osseous margins of the bony external canal appears slightly irregular, although the scutum is intact. There is marked disruption of the ossicular chain on the RIGHT. No recognizable malleo-incudal joint. Probable displacement of the stapes away from the oval window. There is soft tissue in the attic and thinning of the tegmen tympani. There is widening of the aditus ad antrum, coalescence of the mastoid air cells, and thinning or dehiscence of the tegmen mastoideum, all consistent with chronic  and widespread cholesteatoma. RIGHT inner ear structures intact without take down of the bony margins of the semicircular canals. On the LEFT, there is similar but less severe disease. The external canal is unremarkable. LEFT TM is retracted. Anatomic ossicular anatomy is observed without erosion or displacement of the malleus, incus, or stapes. Within the LEFT middle ear cavity, there is soft tissue/fluid inferiorly, but the attic remains aerated with normal-appearing tegmen tympani. Slight widening of the LEFT aditus ad antrum and coalescence of LEFT mastoid air cells but intact tegmen mastoideum. Normal LEFT inner ear structures. Unremarkable appearing noncontrast intracranial compartment. Normal visualized TMJs and extracranial soft tissues. No sinus air-fluid level is evident. IMPRESSION: Severe BILATERAL chronic otitis media with BILATERAL middle ear and mastoid cholesteatomas, RIGHT much worse than LEFT. 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 Similar/less severe changes on the LEFT. Dehiscence of the tegmen tympani and tegmen mastoideum on the RIGHT is suspected. Within limits of visualization on noncontrast exam, no visible intracranial complication.  Electronically Signed   By: Rolla Flatten M.D.   On: 07/18/2014 15:34    Assessment & Plan:   Jadd was seen today for follow-up, hypothyroidism, diabetes and hypertension.  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  I recommended that he follow-up with diabetic education and endocrinology. -     Basic metabolic panel; Future -     Hemoglobin A1c; Future -     Hemoglobin A1c -     Basic metabolic panel -     Consult to [facility] [place] Management -     Amb Referral to Nutrition and Diabetic E -     Ambulatory referral to Endocrinology  Benign prostatic hyperplasia without lower urinary tract symptoms- His PSA is not rising which is a reassuring sign that he does not have prostate cancer. -     PSA; Future -     PSA  Chronic renal impairment, stage 2 (mild)- His renal function is stable.  His blood pressure is adequately well controlled.  I have encouraged him to try to get better control of his blood sugars. -     Basic metabolic panel; Future -     Urinalysis, Routine w reflex microscopic; Future -     Urinalysis, Routine w reflex microscopic -     Basic metabolic panel  Acquired hypothyroidism- His TSH is elevated.  I have asked him to increase his dose of levothyroxine. -     TSH; Future -     TSH -     levothyroxine (SYNTHROID) 100 MCG tablet; Take 1 tablet (100 mcg total) by mouth daily.   I have discontinued Rehaan L. Guzek's levothyroxine. I am also having him start on levothyroxine. Additionally, I am having him maintain his aspirin, insulin NPH-regular Human, insulin glargine, glucose blood, Edarbi, amLODipine, irbesartan, rosuvastatin, triamterene-hydrochlorothiazide, ketorolac, moxifloxacin, and prednisoLONE acetate.  Meds ordered this encounter  Medications  . levothyroxine (SYNTHROID) 100 MCG tablet    Sig: Take 1 tablet (100 mcg total) by mouth daily.    Dispense:  90  tablet    Refill:  0     Follow-up: No follow-ups on file.  Scarlette Calico, MD

## 2019-10-07 ENCOUNTER — Encounter: Payer: Self-pay | Admitting: Internal Medicine

## 2019-10-07 NOTE — Patient Instructions (Signed)
Hypothyroidism  Hypothyroidism is when the thyroid gland does not make enough of certain hormones (it is underactive). The thyroid gland is a small gland located in the lower front part of the neck, just in front of the windpipe (trachea). This gland makes hormones that help control how the body uses food for energy (metabolism) as well as how the heart and brain function. These hormones also play a role in keeping your bones strong. When the thyroid is underactive, it produces too little of the hormones thyroxine (T4) and triiodothyronine (T3). What are the causes? This condition may be caused by:  Hashimoto's disease. This is a disease in which the body's disease-fighting system (immune system) attacks the thyroid gland. This is the most common cause.  Viral infections.  Pregnancy.  Certain medicines.  Birth defects.  Past radiation treatments to the head or neck for cancer.  Past treatment with radioactive iodine.  Past exposure to radiation in the environment.  Past surgical removal of part or all of the thyroid.  Problems with a gland in the center of the brain (pituitary gland).  Lack of enough iodine in the diet. What increases the risk? You are more likely to develop this condition if:  You are male.  You have a family history of thyroid conditions.  You use a medicine called lithium.  You take medicines that affect the immune system (immunosuppressants). What are the signs or symptoms? Symptoms of this condition include:  Feeling as though you have no energy (lethargy).  Not being able to tolerate cold.  Weight gain that is not explained by a change in diet or exercise habits.  Lack of appetite.  Dry skin.  Coarse hair.  Menstrual irregularity.  Slowing of thought processes.  Constipation.  Sadness or depression. How is this diagnosed? This condition may be diagnosed based on:  Your symptoms, your medical history, and a physical exam.  Blood  tests. You may also have imaging tests, such as an ultrasound or MRI. How is this treated? This condition is treated with medicine that replaces the thyroid hormones that your body does not make. After you begin treatment, it may take several weeks for symptoms to go away. Follow these instructions at home:  Take over-the-counter and prescription medicines only as told by your health care provider.  If you start taking any new medicines, tell your health care provider.  Keep all follow-up visits as told by your health care provider. This is important. ? As your condition improves, your dosage of thyroid hormone medicine may change. ? You will need to have blood tests regularly so that your health care provider can monitor your condition. Contact a health care provider if:  Your symptoms do not get better with treatment.  You are taking thyroid replacement medicine and you: ? Sweat a lot. ? Have tremors. ? Feel anxious. ? Lose weight rapidly. ? Cannot tolerate heat. ? Have emotional swings. ? Have diarrhea. ? Feel weak. Get help right away if you have:  Chest pain.  An irregular heartbeat.  A rapid heartbeat.  Difficulty breathing. Summary  Hypothyroidism is when the thyroid gland does not make enough of certain hormones (it is underactive).  When the thyroid is underactive, it produces too little of the hormones thyroxine (T4) and triiodothyronine (T3).  The most common cause is Hashimoto's disease, a disease in which the body's disease-fighting system (immune system) attacks the thyroid gland. The condition can also be caused by viral infections, medicine, pregnancy, or past   radiation treatment to the head or neck.  Symptoms may include weight gain, dry skin, constipation, feeling as though you do not have energy, and not being able to tolerate cold.  This condition is treated with medicine to replace the thyroid hormones that your body does not make. This information  is not intended to replace advice given to you by your health care provider. Make sure you discuss any questions you have with your health care provider. Document Revised: 05/05/2017 Document Reviewed: 05/03/2017 Elsevier Patient Education  2020 Elsevier Inc.  

## 2019-10-13 ENCOUNTER — Other Ambulatory Visit: Payer: Self-pay | Admitting: Internal Medicine

## 2019-10-13 DIAGNOSIS — E1051 Type 1 diabetes mellitus with diabetic peripheral angiopathy without gangrene: Secondary | ICD-10-CM

## 2019-10-13 DIAGNOSIS — N182 Chronic kidney disease, stage 2 (mild): Secondary | ICD-10-CM

## 2019-10-13 DIAGNOSIS — E103599 Type 1 diabetes mellitus with proliferative diabetic retinopathy without macular edema, unspecified eye: Secondary | ICD-10-CM

## 2019-10-13 DIAGNOSIS — I70209 Unspecified atherosclerosis of native arteries of extremities, unspecified extremity: Secondary | ICD-10-CM

## 2019-10-13 DIAGNOSIS — IMO0002 Reserved for concepts with insufficient information to code with codable children: Secondary | ICD-10-CM

## 2019-10-13 DIAGNOSIS — I10 Essential (primary) hypertension: Secondary | ICD-10-CM

## 2019-10-16 ENCOUNTER — Other Ambulatory Visit: Payer: Self-pay | Admitting: *Deleted

## 2019-10-16 ENCOUNTER — Telehealth: Payer: Self-pay

## 2019-10-16 NOTE — Patient Outreach (Signed)
[place] [facility]) Care Management  10/16/2019  YASUO PAFFORD 06/29/1950 [id number]   RN Health Coach Initial Assessment  Referral Date:01/23/2019 Referral Source:Primary MD Reason for Referral:Disease Management Education Insurance:Medicare   Outreach Attempt:  Outreach attempt #9 to patient for initial telephone assessment and provider referral. No answer. RN Health Coach left HIPAA compliant voicemail message along with contact information.  RN Health Coach outreach to primary care office to speak with nurse for possible better contact number for patient.  Spoke with Demi and confirmed they have the same contact number as in the system and does not have another number available.  Message left for Dr. Ronnald Ramp nurse notifying them of difficulties in contacting patient.  Plan:  RN Health Coach will make another outreach attempt within the month of June if no return call back from patient.  [place] [phone] [email or web address] [email or web address]

## 2019-10-16 NOTE — Telephone Encounter (Signed)
noted 

## 2019-10-16 NOTE — Telephone Encounter (Signed)
Logan [name] with [employer] farm manager and states that she received the referral, and that she has been trying to get in contact with the patient since September 2020. States that the one time she was able to get him on the phone, he said the best time to reach him was around 12:00am. States that she will continue to try to reach him. CB#: [phone]

## 2019-10-29 ENCOUNTER — Encounter: Payer: Self-pay | Admitting: Internal Medicine

## 2019-11-13 DIAGNOSIS — E1140 Type 2 diabetes mellitus with diabetic neuropathy, unspecified: Secondary | ICD-10-CM | POA: Diagnosis not present

## 2019-11-13 DIAGNOSIS — E109 Type 1 diabetes mellitus without complications: Secondary | ICD-10-CM | POA: Diagnosis not present

## 2019-11-13 DIAGNOSIS — E7800 Pure hypercholesterolemia, unspecified: Secondary | ICD-10-CM | POA: Diagnosis not present

## 2019-11-13 DIAGNOSIS — I2510 Atherosclerotic heart disease of native coronary artery without angina pectoris: Secondary | ICD-10-CM | POA: Diagnosis not present

## 2019-11-13 DIAGNOSIS — E11319 Type 2 diabetes mellitus with unspecified diabetic retinopathy without macular edema: Secondary | ICD-10-CM | POA: Diagnosis not present

## 2019-11-13 DIAGNOSIS — I10 Essential (primary) hypertension: Secondary | ICD-10-CM | POA: Diagnosis not present

## 2019-11-13 DIAGNOSIS — E039 Hypothyroidism, unspecified: Secondary | ICD-10-CM | POA: Diagnosis not present

## 2019-11-13 DIAGNOSIS — E1065 Type 1 diabetes mellitus with hyperglycemia: Secondary | ICD-10-CM | POA: Diagnosis not present

## 2019-11-21 ENCOUNTER — Other Ambulatory Visit: Payer: Self-pay | Admitting: *Deleted

## 2019-11-21 NOTE — Patient Outreach (Signed)
[place] [facility]) Care Management  11/21/2019  Logan [name] 1950/12/30 [id number]   RN Health Coach Initial Assessment  Referral Date:01/23/2019 Referral Source:Primary MD Reason for Referral:Disease Management Education Insurance:Medicare   Outreach Attempt:  Outreach attempt #10 to patient for initial telephone assessment. No answer. RN Health Coach left HIPAA compliant voicemail message along with contact information.  Plan:  RN Health Coach will make another outreach attempt within the month of July if no return call back from patient.   [place] [phone] [email or web address] [email or web address]

## 2019-12-15 ENCOUNTER — Other Ambulatory Visit: Payer: Self-pay | Admitting: Internal Medicine

## 2019-12-25 ENCOUNTER — Other Ambulatory Visit: Payer: Self-pay | Admitting: *Deleted

## 2019-12-25 NOTE — Patient Outreach (Signed)
[place] [facility]) Care Management  12/25/2019  KIMBALL APPLEBY August 23, 1950 [id number]   RN Health Coach Initial Assessment  Referral Date:01/23/2019 Referral Source:Primary MD Reason for Referral:Disease Management Education Insurance:Medicare   Outreach Attempt:  Outreach attempt #11 to patient for initial telephone assessment. No answer. RN Health Coach left HIPAA compliant voicemail message along with contact information.  Plan: RN Health Coach will make another outreach attempt within the month of August if no return call back from patient.    [place] [phone] Natori [email or web address]

## 2019-12-29 ENCOUNTER — Other Ambulatory Visit: Payer: Self-pay | Admitting: Internal Medicine

## 2019-12-29 DIAGNOSIS — E039 Hypothyroidism, unspecified: Secondary | ICD-10-CM

## 2020-01-12 ENCOUNTER — Other Ambulatory Visit: Payer: Self-pay | Admitting: Family

## 2020-01-12 DIAGNOSIS — I10 Essential (primary) hypertension: Secondary | ICD-10-CM

## 2020-01-12 DIAGNOSIS — IMO0002 Reserved for concepts with insufficient information to code with codable children: Secondary | ICD-10-CM

## 2020-01-12 DIAGNOSIS — I70209 Unspecified atherosclerosis of native arteries of extremities, unspecified extremity: Secondary | ICD-10-CM

## 2020-01-12 DIAGNOSIS — N182 Chronic kidney disease, stage 2 (mild): Secondary | ICD-10-CM

## 2020-01-14 DIAGNOSIS — E039 Hypothyroidism, unspecified: Secondary | ICD-10-CM | POA: Diagnosis not present

## 2020-01-19 ENCOUNTER — Other Ambulatory Visit: Payer: Self-pay | Admitting: Internal Medicine

## 2020-01-19 DIAGNOSIS — I10 Essential (primary) hypertension: Secondary | ICD-10-CM

## 2020-01-23 ENCOUNTER — Other Ambulatory Visit: Payer: Self-pay | Admitting: *Deleted

## 2020-01-23 NOTE — Patient Outreach (Signed)
[place] [facility]) Care Management  01/23/2020  Logan [name] 24-Nov-1950 [id number]   RN Health Coach Initial Assessment  Referral Date:01/23/2019 Referral Source:Primary MD Reason for Referral:Disease Management Education Insurance:Medicare   Outreach Attempt:  Outreach attempt #12 to patient for initial telephone assessment.  Patient answered and verified HIPAA.  RN Health Coach reintroduced self and role.  [facility] services reviewed and discussed.  Patient continues to be reluctant to agree to services, stating he is very hard to reach over the phone.  Stating "it was by luck I answered today, just happen to be in the house".  Patient stating he knows about his diabetes and knows how to manage it, it is just a matter of him doing so, states he knows he eats the wrong things a lot of the times.  Agrees to receive and review program brochure and request call back later to discuss program.   Plan:  [place] will make another outreach attempt within the month of October per patient's request.  [place] will send patient [place].    [place] [phone] Logan [name]

## 2020-03-03 ENCOUNTER — Other Ambulatory Visit: Payer: Self-pay | Admitting: Internal Medicine

## 2020-03-04 DIAGNOSIS — Z23 Encounter for immunization: Secondary | ICD-10-CM | POA: Diagnosis not present

## 2020-03-13 DIAGNOSIS — Z23 Encounter for immunization: Secondary | ICD-10-CM | POA: Diagnosis not present

## 2020-03-15 ENCOUNTER — Other Ambulatory Visit: Payer: Self-pay | Admitting: Internal Medicine

## 2020-03-19 ENCOUNTER — Encounter: Payer: Self-pay | Admitting: *Deleted

## 2020-03-19 ENCOUNTER — Other Ambulatory Visit: Payer: Self-pay | Admitting: *Deleted

## 2020-03-19 NOTE — Patient Outreach (Signed)
[name] [facility]) Care Management  [place]  03/19/2020   Logan [name] [birth year] [id number]   [place] Initial Assessment  Referral Date:01/23/2019 Referral Source:Primary MD Reason for Referral:Disease Management Education Insurance:Medicare   Outreach Attempt:  Successful telephone outreach to patient for confirmation of participation in program and possible initial telephone assessment.  HIPAA verified with patient.  Patient reports he has obtained brochure and has reviewed.  Verbally agrees to participate in Disease Management program.  Patient completed initial telephone assessment.  Social:  Lives at home alone.  Reports being independent with ADLs and IADLs.  Ambulates independently and denies any falls in the past year.  Patient reports being very active and walks at least 3 times a week.  Drives self to his medical appointments.  DME in the home include:  CBG meter, straight cane, rolling walker, bedside commode, shower chair, upper and lower dentures, grab bar around toilet, and blood pressure cuff.  Conditions:  Per chart review and discussion with patient, PMH include but not limited to:  Type I diabetes, benign prostatic hyperplasia, chronic renal failure, hypothyroidism, diabetic retinopathy, hearing loss, hemochromatosis, hyperlipidemia, hypertension, and right great toe amputation.  Denies any recent hospitalizations or emergency room visits.  Reports having bilateral cataracts needing removal, but he needs to schedule surgery after he arranges reliable transportation from a family member or friend.  Encouraged patient to discuss surgery with his son.  Uses freestyle libre continuous blood glucose monitor and patient reports his blood sugars typically run on the low side.  Fasting blood sugar this morning was 64 with recent fasting ranges of 60-80's.  Admits that he eats what he wants, not always following a diabetic diet.  Does state he  monitors his blood sugars a lot while he is walking and doing exercise to help prevent hypoglycemia.  Latest Hgb A1C was 9.2 on 10/03/2019.  Monitors blood pressures daily and states he does have some episodes of hypotension with blood pressures as low as 89/52.  Encouraged patient to notify primary care provider of hypotensive episodes.  Medications:   Patient reports taking about 9 medications daily.  Manages medications himself with weekly pill box fills.  Denies any issues with affording his medications at this time.   Encounter Medications:  Outpatient Encounter Medications as of 03/19/2020  Medication Sig Note  . amLODipine (NORVASC) 10 MG tablet Take 1 tablet by mouth once daily   . aspirin 81 MG tablet Take 81 mg by mouth 2 (two) times daily.    . Azilsartan Medoxomil (EDARBI) 80 MG TABS Take 80 mg by mouth daily.   Marland Kitchen glucose blood (FREESTYLE LITE) test strip Use TID   . insulin glargine (LANTUS) 100 UNIT/ML injection INJECT 0.2MLS INTO THE SKIN ONCE DAILY AT BEDTIME 03/19/2020: Reports taking 10-12 units daily  . insulin NPH-regular Human (NOVOLIN 70/30) (70-30) 100 UNIT/ML injection Inject 20 Units into the skin 2 (two) times daily with a meal. 03/19/2020: Reports taking 17 units twice a day  . irbesartan (AVAPRO) 150 MG tablet Take 1 tablet by mouth once daily   . levothyroxine (SYNTHROID) 100 MCG tablet Take 1 tablet by mouth once daily   . rosuvastatin (CRESTOR) 10 MG tablet Take 1 tablet (10 mg total) by mouth daily.   Marland Kitchen triamterene-hydrochlorothiazide (MAXZIDE-25) 37.5-25 MG tablet Take 1/2 (one-half) tablet by mouth once daily   . ketorolac (ACULAR) 0.5 % ophthalmic solution Place 1 drop into the right eye 4 (four) times daily. (Patient not taking:  Reported on 03/19/2020) 03/19/2020: Reports not taking  . moxifloxacin (VIGAMOX) 0.5 % ophthalmic solution Place 1 drop into the right eye 4 (four) times daily. (Patient not taking: Reported on 03/19/2020) 03/19/2020: Reports not taking at  this time  . prednisoLONE acetate (PRED FORTE) 1 % ophthalmic suspension Place 1 drop into the right eye 4 (four) times daily. (Patient not taking: Reported on 03/19/2020) 03/19/2020: Reports not taking at this time  . [DISCONTINUED] amLODipine (NORVASC) 10 MG tablet Take 1 tablet by mouth once daily   . [DISCONTINUED] amLODipine (NORVASC) 10 MG tablet Take 1 tablet by mouth once daily   . [DISCONTINUED] irbesartan (AVAPRO) 150 MG tablet Take 1 tablet (150 mg total) by mouth daily.   . [DISCONTINUED] levothyroxine (SYNTHROID) 100 MCG tablet Take 1 tablet (100 mcg total) by mouth daily.    No facility-administered encounter medications on file as of 03/19/2020.    Functional Status:  No flowsheet data found.  Fall/Depression Screening: Fall Risk  07/04/2018 07/03/2018 06/20/2017  Falls in the past year? 0 0 No  Number falls in past yr: - 0 -  Injury with Fall? - 0 -  Follow up - Falls evaluation completed -   PHQ 2/9 Scores 03/19/2020 01/23/2019 07/03/2018 06/20/2017 03/30/2016 10/22/2014 01/08/2014  PHQ - 2 Score 0 0 0 0 0 0 0    Goals Addressed            This Visit's Progress   . [facility]) Learn More About My Health       Follow Up Date 06/04/20   - tell my story and reason for my visit - make a list of questions - ask questions - repeat what I heard to make sure I understand - bring a list of my medicines to the visit - speak up when I don't understand    Why is this important?   The best way to learn about your health and care is by talking to the doctor and nurse.  They will answer your questions and give you information in the way that you like best.    Notes:     . [facility]) Make and Keep All Appointments       Follow Up Date 06/04/2020   - ask family or friend for a ride - call to cancel if needed - keep a calendar with appointment dates    Why is this important?   Part of staying healthy is seeing the doctor for follow-up care.  If you forget your appointments, there  are some things you can do to stay on track.    Notes:  Discuss with son need for cataract surgery and need for transportation from family or friends and schedule surgery soon as transportation can be arranged; attend scheduled endocrinology appointment    . [facility]) Monitor and Manage My Blood Sugar       Follow Up Date 06/04/2020    - check blood sugar at prescribed times - check blood sugar before and after exercise - check blood sugar if I feel it is too high or too low - enter blood sugar readings and medication or insulin into daily log - take the blood sugar log to all doctor visits - take the blood sugar meter to all doctor visits    Why is this important?   Checking your blood sugar at home helps to keep it from getting very high or very low.  Writing the results in a diary or log helps the doctor  know how to care for you.  Your blood sugar log should have the time, the date and the results.  Also, write down the amount of insulin or other medicine you take.  Other information like what you ate, exercise done and how you were feeling will also be helpful..     Notes:     . [facility]) Set My Target A1C       Follow Up Date 06/04/2020    - set target A1C--Target 7.5; Current 9.2    Why is this important?   Your target A1C is decided together by you and your doctor.  It is based on several things like your age and other health issues.    Notes:     . [facility]) Perform Foot Care       Follow Up Date 06/04/20   - check feet daily for cuts, sores or redness - wash and dry feet carefully every day - wear comfortable, cotton socks - wear comfortable, well-fitting shoes    Why is this important?   Good foot care is very important when you have diabetes.  There are many things you can do to keep your feet healthy and catch a problem early.    Notes:       Appointments:  Attended appointment with primary care provider, Dr. Ronnald Ramp on 10/03/2019.  Has scheduled appointment with  Endocrinologist, Dr. Chalmers Cater on 05/14/2020 and Cardiologist, Dr. Harrington Challenger on 05/11/2020.  Advanced Directives:  Has Living Will and [place] in place and does not wish to make any changes at this time.   Consent:  [facility] services reviewed and discussed.  Patient verbally agrees to Disease Management outreaches.  Plan: RN Health Coach will send primary MD barriers letter. RN Health Coach will route initial telephone assessment note to primary MD. [place] will send patient [place]. RN Health Coach will send patient Living Well with Diabetes Educational Packet. RN Health Coach will send patient 2021 Calendar Booklet. RN Health Coach will make next telephone outreach to patient in the month of December and patient agrees to future outreach.  [place] Coach [phone] [email or web address] [email or web address]

## 2020-03-25 ENCOUNTER — Other Ambulatory Visit: Payer: Self-pay | Admitting: Internal Medicine

## 2020-03-25 DIAGNOSIS — E039 Hypothyroidism, unspecified: Secondary | ICD-10-CM

## 2020-04-19 ENCOUNTER — Other Ambulatory Visit: Payer: Self-pay | Admitting: Internal Medicine

## 2020-04-19 DIAGNOSIS — E103599 Type 1 diabetes mellitus with proliferative diabetic retinopathy without macular edema, unspecified eye: Secondary | ICD-10-CM

## 2020-04-19 DIAGNOSIS — I10 Essential (primary) hypertension: Secondary | ICD-10-CM

## 2020-04-19 DIAGNOSIS — N182 Chronic kidney disease, stage 2 (mild): Secondary | ICD-10-CM

## 2020-04-19 DIAGNOSIS — E1051 Type 1 diabetes mellitus with diabetic peripheral angiopathy without gangrene: Secondary | ICD-10-CM

## 2020-04-19 DIAGNOSIS — IMO0002 Reserved for concepts with insufficient information to code with codable children: Secondary | ICD-10-CM

## 2020-04-20 LAB — HM DIABETES EYE EXAM

## 2020-05-07 DIAGNOSIS — E7800 Pure hypercholesterolemia, unspecified: Secondary | ICD-10-CM | POA: Diagnosis not present

## 2020-05-07 DIAGNOSIS — E109 Type 1 diabetes mellitus without complications: Secondary | ICD-10-CM | POA: Diagnosis not present

## 2020-05-07 DIAGNOSIS — E039 Hypothyroidism, unspecified: Secondary | ICD-10-CM | POA: Diagnosis not present

## 2020-05-07 LAB — TSH: TSH: 0.67 (ref <=5.90)

## 2020-05-11 ENCOUNTER — Encounter: Payer: Self-pay | Admitting: Internal Medicine

## 2020-05-11 ENCOUNTER — Other Ambulatory Visit: Payer: Self-pay

## 2020-05-11 ENCOUNTER — Ambulatory Visit (INDEPENDENT_AMBULATORY_CARE_PROVIDER_SITE_OTHER): Payer: Medicare Other | Admitting: Internal Medicine

## 2020-05-11 VITALS — BP 132/58 | HR 63 | Ht 74.0 in | Wt 174.2 lb

## 2020-05-11 DIAGNOSIS — I2510 Atherosclerotic heart disease of native coronary artery without angina pectoris: Secondary | ICD-10-CM

## 2020-05-11 DIAGNOSIS — I70209 Unspecified atherosclerosis of native arteries of extremities, unspecified extremity: Secondary | ICD-10-CM | POA: Diagnosis not present

## 2020-05-11 DIAGNOSIS — E1051 Type 1 diabetes mellitus with diabetic peripheral angiopathy without gangrene: Secondary | ICD-10-CM

## 2020-05-11 NOTE — Progress Notes (Signed)
Cardiology Office Note   Date:  05/11/2020   ID:  Logan [name], DOB 07-20-1950, MRN [id number]  PCP:  Janith Lima, MD  Cardiologist:   Dorris Carnes, MD    F?U of CAD and HTN     History of Present Illness: Logan [name] is a 69 y.o. male with a history of history of CAD. 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 He had nonobstructive disease at catheterization in 2004. He had a negative Myoview scan in 2009 with an ejection fraction of 49%.  Pt has also had syncope in past Known bradycardia.  Pt was last seen in cardiology in Dec 2020  Since seen the pt has done well from a cardiac standpoint   His breathing is good   He denies CP   Hunts regularly   Dragged a deer over 100 yards Sugars have been high   Due to see B Balan soon.  Current Meds  Medication Sig  . amLODipine (NORVASC) 10 MG tablet Take 1 tablet by mouth once daily  . aspirin 81 MG tablet Take 81 mg by mouth 2 (two) times daily.   . Azilsartan Medoxomil (EDARBI) 80 MG TABS Take 80 mg by mouth daily.  Marland Kitchen glucose blood (FREESTYLE LITE) test strip Use TID  . insulin glargine (LANTUS) 100 UNIT/ML injection INJECT 0.2MLS INTO THE SKIN ONCE DAILY AT BEDTIME  . insulin NPH-regular Human (NOVOLIN 70/30) (70-30) 100 UNIT/ML injection Inject 20 Units into the skin 2 (two) times daily with a meal.  . irbesartan (AVAPRO) 150 MG tablet Take 1 tablet by mouth once daily  . ketorolac (ACULAR) 0.5 % ophthalmic solution Place 1 drop into the right eye 4 (four) times daily.   Marland Kitchen levothyroxine (SYNTHROID) 100 MCG tablet Take 1 tablet by mouth once daily  . triamterene-hydrochlorothiazide (MAXZIDE-25) 37.5-25 MG tablet Take 1/2 (one-half) tablet by mouth once daily  . [DISCONTINUED] prednisoLONE acetate (PRED FORTE) 1 % ophthalmic suspension Place 1 drop into the right eye 4 (four) times daily.      Allergies:   Patient has no known allergies.   Past Medical History:  Diagnosis Date  . Cataract   . Coronary  artery disease involving native coronary artery of native heart without angina pectoris 11/01/2007   Last Cath '04: LAD w/ 60% long lesion; 1st Dx 70% ostial; 2nd Dx  70% ostial; Cx 30% ostial; RCA-dominant multiple 30-40% lesions; PDA 30% lesions  Last nuclear stress March '09 - negative for ischemia   . Diabetes mellitus   . Diabetic retinopathy   . Hearing loss   . Hemochromatosis   . Hyperlipidemia   . Hypertension     Past Surgical History:  Procedure Laterality Date  . 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 INNER EAR SURGERY     right     Social History:  The patient  reports that he has never smoked. He has never used smokeless tobacco. He reports current alcohol use. He reports that he does not use drugs.   Family History:  The patient's family history includes Alcohol abuse in his father; Asthma in his mother; Cancer in his father; Colon cancer in his maternal uncle; Emphysema in his mother; Heart failure in his mother; Stroke in his father.    ROS:  Please see the history  of present illness. All other systems are reviewed and  Negative to the above problem except as noted.    PHYSICAL EXAM: VS:  BP (!) 132/58   Pulse 63   Ht 6\' 2"  (1.88 m)   Wt 174 lb 3.2 oz (79 kg)   SpO2 97%   BMI 22.37 kg/m    GEN: Thin 69 year old, in no acute distress  HEENT: normal  Neck: JVP is normal   No carotid bruits Cardiac: RRR; no murmurs.  No LE edema  Respiratory:  clear to auscultation bilaterally,  GI: soft, nontender, nondistended, + BS  No hepatomegaly  Skin: warm and dry, no rash Neuro:  Strength and sensation are intact Psych: euthymic mood, full affect   EKG:  EKG is  ordered today.   Sinus bradycardia with occsional PAC  59 bpm     Lipid Panel    Component Value Date/Time   CHOL 108 01/21/2019 0836   TRIG 127.0  01/21/2019 0836   HDL 41.60 01/21/2019 0836   CHOLHDL 3 01/21/2019 0836   VLDL 25.4 01/21/2019 0836   LDLCALC 41 01/21/2019 0836      Wt Readings from Last 3 Encounters:  05/11/20 174 lb 3.2 oz (79 kg)  10/03/19 179 lb (81.2 kg)  05/17/19 171 lb 12.8 oz (77.9 kg)      ASSESSMENT AND PLAN:  1  CAD Pt continues to do well   Remains active, asymptomatic  2  HTN   BP is OK   3  HL  LDL 54   HDL 46    4  Hx bradycardia   Denies dizziness or SOB   5.  DM   Reviewed diet, sugars   Due to see Dr Chalmers Cater later this week  F/u 1 year sooner with problems.  Current medicines are reviewed at length with the patient today.  The patient does not have concerns regarding medicines.  Signed, Dorris Carnes, MD  05/11/2020 [facility] [place], [place],   [address] Phone: [phone]; Fax: [phone]

## 2020-05-11 NOTE — Patient Instructions (Signed)
Medication Instructions:  No changes *If you need a refill on your cardiac medications before your next appointment, please call your pharmacy*   Lab Work: none If you have labs (blood work) drawn today and your tests are completely normal, you will receive your results only by: . MyChart Message (if you have MyChart) OR . A paper copy in the mail If you have any lab test that is abnormal or we need to change your treatment, we will call you to review the results.   Testing/Procedures: none   Follow-Up: At [facility], you and your health needs are our priority.  As part of our continuing mission to provide you with exceptional heart care, we have created designated Provider Care Teams.  These Care Teams include your primary Cardiologist (physician) and Advanced Practice Providers (APPs -  Physician Assistants and Nurse Practitioners) who all work together to provide you with the care you need, when you need it.   Your next appointment:   12 month(s)  The format for your next appointment:   In Person  Provider:   You may see Paula Ross, MD or one of the following Advanced Practice Providers on your designated Care Team:    Scott Weaver, PA-C  Vin Bhagat, PA-C   Other Instructions   

## 2020-05-12 ENCOUNTER — Other Ambulatory Visit: Payer: Self-pay | Admitting: *Deleted

## 2020-05-12 NOTE — Patient Outreach (Signed)
[place] [facility]) Care Management  05/12/2020  Logan [name] 1951/04/16 [id number]   RN Health Coach Monthly Outreach  Referral Date:01/23/2019 Referral Source:Primary MD Reason for Referral:Disease Management Education Insurance:Medicare   Outreach Attempt:  Outreach attempt #1 to patient for follow up. No answer. RN Health Coach left voicemail message along with contact information.  Plan:  RN Health Coach will make another outreach attempt within the month of January if no return call back from patient.   [place] Coach [phone] [email or web address]

## 2020-05-14 DIAGNOSIS — I2510 Atherosclerotic heart disease of native coronary artery without angina pectoris: Secondary | ICD-10-CM | POA: Diagnosis not present

## 2020-05-14 DIAGNOSIS — E109 Type 1 diabetes mellitus without complications: Secondary | ICD-10-CM | POA: Diagnosis not present

## 2020-05-14 DIAGNOSIS — E11319 Type 2 diabetes mellitus with unspecified diabetic retinopathy without macular edema: Secondary | ICD-10-CM | POA: Diagnosis not present

## 2020-05-14 DIAGNOSIS — E1140 Type 2 diabetes mellitus with diabetic neuropathy, unspecified: Secondary | ICD-10-CM | POA: Diagnosis not present

## 2020-05-14 DIAGNOSIS — E7800 Pure hypercholesterolemia, unspecified: Secondary | ICD-10-CM | POA: Diagnosis not present

## 2020-05-14 DIAGNOSIS — I10 Essential (primary) hypertension: Secondary | ICD-10-CM | POA: Diagnosis not present

## 2020-05-14 DIAGNOSIS — E039 Hypothyroidism, unspecified: Secondary | ICD-10-CM | POA: Diagnosis not present

## 2020-05-25 ENCOUNTER — Other Ambulatory Visit: Payer: Self-pay | Admitting: Internal Medicine

## 2020-06-01 ENCOUNTER — Ambulatory Visit (INDEPENDENT_AMBULATORY_CARE_PROVIDER_SITE_OTHER): Payer: Medicare Other | Admitting: Internal Medicine

## 2020-06-01 ENCOUNTER — Encounter: Payer: Self-pay | Admitting: Internal Medicine

## 2020-06-01 ENCOUNTER — Other Ambulatory Visit: Payer: Self-pay

## 2020-06-01 VITALS — BP 136/68 | HR 69 | Temp 98.1°F | Ht 74.0 in | Wt 174.0 lb

## 2020-06-01 DIAGNOSIS — I70209 Unspecified atherosclerosis of native arteries of extremities, unspecified extremity: Secondary | ICD-10-CM | POA: Diagnosis not present

## 2020-06-01 DIAGNOSIS — E103599 Type 1 diabetes mellitus with proliferative diabetic retinopathy without macular edema, unspecified eye: Secondary | ICD-10-CM

## 2020-06-01 DIAGNOSIS — E039 Hypothyroidism, unspecified: Secondary | ICD-10-CM

## 2020-06-01 DIAGNOSIS — I10 Essential (primary) hypertension: Secondary | ICD-10-CM | POA: Diagnosis not present

## 2020-06-01 DIAGNOSIS — N182 Chronic kidney disease, stage 2 (mild): Secondary | ICD-10-CM | POA: Diagnosis not present

## 2020-06-01 DIAGNOSIS — IMO0002 Reserved for concepts with insufficient information to code with codable children: Secondary | ICD-10-CM

## 2020-06-01 DIAGNOSIS — E109 Type 1 diabetes mellitus without complications: Secondary | ICD-10-CM | POA: Diagnosis not present

## 2020-06-01 DIAGNOSIS — E1065 Type 1 diabetes mellitus with hyperglycemia: Secondary | ICD-10-CM | POA: Diagnosis not present

## 2020-06-01 DIAGNOSIS — E1051 Type 1 diabetes mellitus with diabetic peripheral angiopathy without gangrene: Secondary | ICD-10-CM | POA: Diagnosis not present

## 2020-06-01 DIAGNOSIS — E785 Hyperlipidemia, unspecified: Secondary | ICD-10-CM | POA: Diagnosis not present

## 2020-06-01 DIAGNOSIS — N2889 Other specified disorders of kidney and ureter: Secondary | ICD-10-CM

## 2020-06-01 MED ORDER — IRBESARTAN 150 MG PO TABS: 150.0000 mg | ORAL_TABLET | Freq: Every day | ORAL | 1 refills | Status: DC

## 2020-06-01 NOTE — Patient Instructions (Signed)

## 2020-06-01 NOTE — Progress Notes (Signed)
Subjective:  Patient ID: Logan [name], male    DOB: 04-01-1951  Age: 69 y.o. MRN: [id number]  CC: Coronary Artery Disease, Hypertension, Hypothyroidism, and Hyperlipidemia  This visit occurred during the SARS-CoV-2 public health emergency.  Safety protocols were in place, including screening questions prior to the visit, additional usage of staff PPE, and extensive cleaning of exam room while observing appropriate contact time as indicated for disinfecting solutions.    HPI Logan [name] presents for f/up - He has felt well recently and offers no complaints today.  He tells me he recently saw his endocrinologist and had quite a bit of lab work done.  I do not have any of those results today.  He is active and denies CP, DOE, palpitations, edema, fatigue, dizziness, or lightheadedness.  He also tells me his blood sugar has been well controlled.  Outpatient Medications Prior to Visit  Medication Sig Dispense Refill  . amLODipine (NORVASC) 10 MG tablet Take 1 tablet by mouth once daily 90 tablet 1  . aspirin 81 MG tablet Take 81 mg by mouth 2 (two) times daily.    . insulin glargine (LANTUS) 100 UNIT/ML injection INJECT 0.2MLS INTO THE SKIN ONCE DAILY AT BEDTIME 30 mL 2  . insulin NPH-regular Human (NOVOLIN 70/30) (70-30) 100 UNIT/ML injection Inject 20 Units into the skin 2 (two) times daily with a meal. 30 mL 11  . levothyroxine (SYNTHROID) 100 MCG tablet Take 1 tablet by mouth once daily 90 tablet 0  . rosuvastatin (CRESTOR) 10 MG tablet Take 1 tablet (10 mg total) by mouth daily. 90 tablet 1  . triamterene-hydrochlorothiazide (MAXZIDE-25) 37.5-25 MG tablet Take 1/2 (one-half) tablet by mouth once daily 45 tablet 1  . glucose blood (FREESTYLE LITE) test strip Use TID 300 each 1  . irbesartan (AVAPRO) 150 MG tablet Take 1 tablet by mouth once daily 90 tablet 0  . ketorolac (ACULAR) 0.5 % ophthalmic solution Place 1 drop into the right eye 4 (four) times daily.      No  facility-administered medications prior to visit.    ROS Review of Systems  Constitutional: Negative for appetite change, diaphoresis, fatigue and unexpected weight change.  HENT: Negative.   Eyes: Negative for visual disturbance.  Respiratory: Negative for cough, chest tightness, shortness of breath and wheezing.   Cardiovascular: Negative for chest pain, palpitations and leg swelling.  Gastrointestinal: Negative for abdominal pain, constipation, diarrhea, nausea and vomiting.  Endocrine: Negative.  Negative for polydipsia, polyphagia and polyuria.  Genitourinary: Negative.  Negative for difficulty urinating.  Musculoskeletal: Negative.  Negative for arthralgias and myalgias.  Skin: Negative.  Negative for color change, pallor and rash.  Neurological: Negative.  Negative for dizziness, weakness, light-headedness and headaches.  Hematological: Negative for adenopathy. Does not bruise/bleed easily.  Psychiatric/Behavioral: Negative.     Objective:  BP 136/68   Pulse 69   Temp 98.1 F (36.7 C) (Oral)   Ht 6\' 2"  (1.88 m)   Wt 174 lb (78.9 kg)   SpO2 99%   BMI 22.34 kg/m   BP Readings from Last 3 Encounters:  06/01/20 136/68  05/11/20 (!) 132/58  10/03/19 130/70    Wt Readings from Last 3 Encounters:  06/01/20 174 lb (78.9 kg)  05/11/20 174 lb 3.2 oz (79 kg)  10/03/19 179 lb (81.2 kg)    Physical Exam Vitals reviewed.  HENT:     Nose: Nose normal.     Mouth/Throat:     Mouth: Mucous membranes are moist.  Eyes:     General: No scleral icterus.    Conjunctiva/sclera: Conjunctivae normal.  Cardiovascular:     Rate and Rhythm: Normal rate and regular rhythm.     Heart sounds: No murmur heard.   Pulmonary:     Effort: Pulmonary effort is normal.     Breath sounds: No stridor. No wheezing, rhonchi or rales.  Abdominal:     General: Abdomen is flat. Bowel sounds are normal. There is no distension.     Palpations: Abdomen is soft. There is no hepatomegaly,  splenomegaly or mass.     Tenderness: There is no abdominal tenderness.  Musculoskeletal:        General: Normal range of motion.     Cervical back: Normal range of motion. No tenderness.     Right lower leg: No edema.     Left lower leg: No edema.  Lymphadenopathy:     Cervical: No cervical adenopathy.  Skin:    General: Skin is warm and dry.     Coloration: Skin is not pale.  Neurological:     General: No focal deficit present.     Mental Status: He is alert. Mental status is at baseline.  Psychiatric:        Mood and Affect: Mood normal.        Behavior: Behavior normal.     Lab Results  Component Value Date   WBC 7.9 01/21/2019   HGB 15.2 01/21/2019   HCT 45.2 01/21/2019   PLT 213.0 01/21/2019   GLUCOSE 82 10/03/2019   CHOL 108 01/21/2019   TRIG 127.0 01/21/2019   HDL 41.60 01/21/2019   LDLCALC 41 01/21/2019   ALT 28 01/21/2019   AST 20 01/21/2019   NA 138 10/03/2019   K 5.0 10/03/2019   CL 100 10/03/2019   CREATININE 1.42 10/03/2019   BUN 28 (H) 10/03/2019   CO2 31 10/03/2019   TSH 0.67 05/07/2020   PSA 2.44 10/03/2019   HGBA1C 9.2 (H) 10/03/2019   MICROALBUR 0.7 01/21/2019    CT Temporal Bones W/O CM  Result Date: 07/18/2014 CLINICAL DATA:  BILATERAL chronic otitis media with severe hearing loss. EXAM: CT TEMPORAL BONES WITHOUT CONTRAST TECHNIQUE: Axial and coronal plane CT imaging of the petrous temporal bones was performed with thin-collimation image reconstruction. No intravenous contrast was administered. Multiplanar CT image reconstructions were also generated. COMPARISON:  None. FINDINGS: The RIGHT tympanic membrane is silhouetted by fluid and/or soft tissue in the RIGHT middle ear and fluid/soft tissue in the bony external canal, lateral to the ear drum. The osseous margins of the bony external canal appears slightly irregular, although the scutum is intact. There is marked disruption of the ossicular chain on the RIGHT. No recognizable malleo-incudal  joint. Probable displacement of the stapes away from the oval window. There is soft tissue in the attic and thinning of the tegmen tympani. There is widening of the aditus ad antrum, coalescence of the mastoid air cells, and thinning or dehiscence of the tegmen mastoideum, all consistent with chronic and widespread cholesteatoma. RIGHT inner ear structures intact without take down of the bony margins of the semicircular canals. On the LEFT, there is similar but less severe disease. The external canal is unremarkable. LEFT TM is retracted. Anatomic ossicular anatomy is observed without erosion or displacement of the malleus, incus, or stapes. Within the LEFT middle ear cavity, there is soft tissue/fluid inferiorly, but the attic remains aerated with normal-appearing tegmen tympani. Slight widening of the LEFT aditus ad  antrum and coalescence of LEFT mastoid air cells but intact tegmen mastoideum. Normal LEFT inner ear structures. Unremarkable appearing noncontrast intracranial compartment. Normal visualized TMJs and extracranial soft tissues. No sinus air-fluid level is evident. IMPRESSION: Severe BILATERAL chronic otitis media with BILATERAL middle ear and mastoid cholesteatomas, RIGHT much worse than LEFT. 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 Similar/less severe changes on the LEFT. Dehiscence of the tegmen tympani and tegmen mastoideum on the RIGHT is suspected. Within limits of visualization on noncontrast exam, no visible intracranial complication. Electronically Signed   By: Rolla Flatten M.D.   On: 07/18/2014 15:34    Assessment & Plan:   Wilmar was seen today for coronary artery disease, hypertension, hypothyroidism and hyperlipidemia.  Diagnoses and all orders for this visit:  Type 1 diabetes mellitus without complication ([place])- I will monitor his A1c. -     Microalbumin / creatinine urine ratio; Future -     Hemoglobin  A1c; Future -     HM Diabetes Foot Exam  Chronic renal impairment, stage 2 (mild)- Will monitor his renal function.  Will continue to maintain control of his blood pressure and blood sugar. -     CBC with Differential/Platelet; Future -     Basic metabolic panel; Future -     Urinalysis, Routine w reflex microscopic; Future -     irbesartan (AVAPRO) 150 MG tablet; Take 1 tablet (150 mg total) by mouth daily.  Hyperlipidemia LDL goal <70- Will check a fasting lipid panel to see if he has achieved his LDL goal. -     Lipid panel; Future -     Cancel: TSH; Future -     Hepatic function panel; Future  Hemochromatosis, hereditary ([place])- I will monitor his H&H, LFTs, ferritin, and iron level. -     CBC with Differential/Platelet; Future -     Hepatic function panel; Future -     Ferritin; Future -     Iron; Future  Acquired hypothyroidism- His recent TSH was in the normal range.  He will stay on the current dose of levothyroxine. -     Cancel: TSH; Future  Essential hypertension- His BP is adequately well controlled. Will continue the ARB. -     CBC with Differential/Platelet; Future -     Basic metabolic panel; Future -     Urinalysis, Routine w reflex microscopic; Future -     irbesartan (AVAPRO) 150 MG tablet; Take 1 tablet (150 mg total) by mouth daily.  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   I have discontinued Lea L. Rucinski's glucose blood and ketorolac. I have also changed his irbesartan. Additionally, I am having him maintain his aspirin, insulin NPH-regular Human, insulin glargine, amLODipine, rosuvastatin, triamterene-hydrochlorothiazide, and levothyroxine.  Meds  ordered this encounter  Medications  . irbesartan (AVAPRO) 150 MG tablet    Sig: Take 1 tablet (150 mg total) by mouth daily.    Dispense:  90 tablet    Refill:  1     Follow-up: Return in about 6 months (around 11/30/2020).  Scarlette Calico, MD

## 2020-06-08 ENCOUNTER — Telehealth: Payer: Self-pay | Admitting: Internal Medicine

## 2020-06-08 NOTE — Telephone Encounter (Signed)
Patient calling frustrated because he went to pick up his new prescription for his blood pressure from the pharmacy and they said he cannot get it filled until 01.12.22 because of insurance. Patient unsure of the name of medication just knows it was changed at his last visit with Dr. Yetta Barre on  12.27.21

## 2020-06-12 ENCOUNTER — Encounter: Payer: Self-pay | Admitting: Internal Medicine

## 2020-06-12 NOTE — Telephone Encounter (Signed)
Attempted to call pt. Phone continuously rings.   More than likely insurance will not cover a refill until that date. He can feel free to pay our of pocket until then.

## 2020-06-12 NOTE — Telephone Encounter (Signed)
Patient came into the office today regarding his medication. The patient stated the Dr. Ronnald Ramp was going to provide

## 2020-06-17 ENCOUNTER — Other Ambulatory Visit: Payer: Self-pay

## 2020-06-17 ENCOUNTER — Encounter: Payer: Self-pay | Admitting: Internal Medicine

## 2020-06-17 ENCOUNTER — Ambulatory Visit (INDEPENDENT_AMBULATORY_CARE_PROVIDER_SITE_OTHER): Payer: Medicare Other | Admitting: Internal Medicine

## 2020-06-17 VITALS — BP 136/74 | HR 79 | Temp 98.2°F | Resp 16 | Ht 74.0 in | Wt 174.0 lb

## 2020-06-17 DIAGNOSIS — E785 Hyperlipidemia, unspecified: Secondary | ICD-10-CM | POA: Diagnosis not present

## 2020-06-17 DIAGNOSIS — E1051 Type 1 diabetes mellitus with diabetic peripheral angiopathy without gangrene: Secondary | ICD-10-CM | POA: Diagnosis not present

## 2020-06-17 DIAGNOSIS — I70209 Unspecified atherosclerosis of native arteries of extremities, unspecified extremity: Secondary | ICD-10-CM | POA: Diagnosis not present

## 2020-06-17 DIAGNOSIS — I10 Essential (primary) hypertension: Secondary | ICD-10-CM | POA: Diagnosis not present

## 2020-06-17 DIAGNOSIS — N182 Chronic kidney disease, stage 2 (mild): Secondary | ICD-10-CM | POA: Diagnosis not present

## 2020-06-17 DIAGNOSIS — E109 Type 1 diabetes mellitus without complications: Secondary | ICD-10-CM

## 2020-06-17 LAB — CBC WITH DIFFERENTIAL/PLATELET
Basophils Absolute: 0.1 10*3/uL (ref 0.0–0.1)
Basophils Relative: 1.5 % (ref 0.0–3.0)
Eosinophils Absolute: 0.5 10*3/uL (ref 0.0–0.7)
Eosinophils Relative: 9.1 % — ABNORMAL HIGH (ref 0.0–5.0)
HCT: 46.3 % (ref 39.0–52.0)
Hemoglobin: 15.5 g/dL (ref 13.0–17.0)
Lymphocytes Relative: 28.7 % (ref 12.0–46.0)
Lymphs Abs: 1.7 10*3/uL (ref 0.7–4.0)
MCHC: 33.5 g/dL (ref 30.0–36.0)
MCV: 94 fl (ref 78.0–100.0)
Monocytes Absolute: 0.8 10*3/uL (ref 0.1–1.0)
Monocytes Relative: 13.2 % — ABNORMAL HIGH (ref 3.0–12.0)
Neutro Abs: 2.8 10*3/uL (ref 1.4–7.7)
Neutrophils Relative %: 47.5 % (ref 43.0–77.0)
Platelets: 199 10*3/uL (ref 150.0–400.0)
RBC: 4.92 Mil/uL (ref 4.22–5.81)
RDW: 14.1 % (ref 11.5–15.5)
WBC: 6 10*3/uL (ref 4.0–10.5)

## 2020-06-17 LAB — MICROALBUMIN / CREATININE URINE RATIO
Creatinine,U: 38.2 mg/dL
Microalb Creat Ratio: 5.6 mg/g (ref 0.0–30.0)
Microalb, Ur: 2.2 mg/dL — ABNORMAL HIGH (ref 0.0–1.9)

## 2020-06-17 LAB — BASIC METABOLIC PANEL
BUN: 18 mg/dL (ref 6–23)
CO2: 33 mEq/L — ABNORMAL HIGH (ref 19–32)
Calcium: 9.6 mg/dL (ref 8.4–10.5)
Chloride: 102 mEq/L (ref 96–112)
Creatinine, Ser: 1.23 mg/dL (ref 0.40–1.50)
GFR: 59.96 mL/min — ABNORMAL LOW (ref >60.00)
Glucose, Bld: 71 mg/dL (ref 70–99)
Potassium: 4.1 mEq/L (ref 3.5–5.1)
Sodium: 140 mEq/L (ref 135–145)

## 2020-06-17 LAB — LIPID PANEL
Cholesterol: 94 mg/dL (ref 0–200)
HDL: 47.6 mg/dL (ref >39.00)
LDL Cholesterol: 36 mg/dL (ref 0–99)
NonHDL: 46.44
Total CHOL/HDL Ratio: 2
Triglycerides: 54 mg/dL (ref 0.0–149.0)
VLDL: 10.8 mg/dL (ref 0.0–40.0)

## 2020-06-17 LAB — HEPATIC FUNCTION PANEL
ALT: 28 U/L (ref 0–53)
AST: 25 U/L (ref 0–37)
Albumin: 4.4 g/dL (ref 3.5–5.2)
Alkaline Phosphatase: 137 U/L — ABNORMAL HIGH (ref 39–117)
Bilirubin, Direct: 0.2 mg/dL (ref 0.0–0.3)
Total Bilirubin: 0.8 mg/dL (ref 0.2–1.2)
Total Protein: 7.2 g/dL (ref 6.0–8.3)

## 2020-06-17 LAB — URINALYSIS, ROUTINE W REFLEX MICROSCOPIC
Bilirubin Urine: NEGATIVE
Hgb urine dipstick: NEGATIVE
Ketones, ur: NEGATIVE
Leukocytes,Ua: NEGATIVE
Nitrite: NEGATIVE
RBC / HPF: NONE SEEN (ref 0–?)
Specific Gravity, Urine: 1.01 (ref 1.000–1.030)
Total Protein, Urine: NEGATIVE
Urine Glucose: NEGATIVE
Urobilinogen, UA: 0.2 (ref 0.0–1.0)
WBC, UA: NONE SEEN (ref 0–?)
pH: 7 (ref 5.0–8.0)

## 2020-06-17 LAB — IRON: Iron: 175 ug/dL — ABNORMAL HIGH (ref 42–165)

## 2020-06-17 LAB — FERRITIN: Ferritin: 85.2 ng/mL (ref 22.0–322.0)

## 2020-06-17 LAB — HEMOGLOBIN A1C: Hgb A1c MFr Bld: 7.3 % — ABNORMAL HIGH (ref 4.6–6.5)

## 2020-06-17 NOTE — Progress Notes (Signed)
Subjective:  Patient ID: Logan [name], male    DOB: 1950/10/25  Age: 70 y.o. MRN: [id number]  CC: Hypertension, Hyperlipidemia, Hypothyroidism, and Diabetes  This visit occurred during the SARS-CoV-2 public health emergency.  Safety protocols were in place, including screening questions prior to the visit, additional usage of staff PPE, and extensive cleaning of exam room while observing appropriate contact time as indicated for disinfecting solutions.    HPI MARIAN GRANDT presents for f/up - He tells me he walks 4 miles a day and does not experience chest pain, shortness of breath, palpitations, edema, or fatigue.  He tells me his blood pressure and blood sugar have been well controlled.  Outpatient Medications Prior to Visit  Medication Sig Dispense Refill  . amLODipine (NORVASC) 10 MG tablet Take 1 tablet by mouth once daily 90 tablet 1  . aspirin 81 MG tablet Take 81 mg by mouth 2 (two) times daily.    . insulin glargine (LANTUS) 100 UNIT/ML injection INJECT 0.2MLS INTO THE SKIN ONCE DAILY AT BEDTIME 30 mL 2  . insulin NPH-regular Human (NOVOLIN 70/30) (70-30) 100 UNIT/ML injection Inject 20 Units into the skin 2 (two) times daily with a meal. 30 mL 11  . irbesartan (AVAPRO) 150 MG tablet Take 1 tablet (150 mg total) by mouth daily. 90 tablet 1  . levothyroxine (SYNTHROID) 100 MCG tablet Take 1 tablet by mouth once daily 90 tablet 0  . rosuvastatin (CRESTOR) 10 MG tablet Take 1 tablet (10 mg total) by mouth daily. 90 tablet 1  . triamterene-hydrochlorothiazide (MAXZIDE-25) 37.5-25 MG tablet Take 1/2 (one-half) tablet by mouth once daily 45 tablet 1   No facility-administered medications prior to visit.    ROS Review of Systems  Constitutional: Negative.  Negative for appetite change, diaphoresis, fatigue and unexpected weight change.  HENT: Negative.   Eyes: Negative.   Respiratory: Negative for cough, chest tightness, shortness of breath and wheezing.   Cardiovascular:  Negative for chest pain, palpitations and leg swelling.  Gastrointestinal: Negative for abdominal pain, constipation, diarrhea, nausea and vomiting.  Endocrine: Negative for cold intolerance and heat intolerance.  Genitourinary: Negative.  Negative for difficulty urinating.  Musculoskeletal: Negative for arthralgias and myalgias.  Skin: Negative.  Negative for color change.  Neurological: Negative.  Negative for dizziness, weakness and light-headedness.  Hematological: Negative for adenopathy. Does not bruise/bleed easily.  Psychiatric/Behavioral: Negative.     Objective:  BP 136/74   Pulse 79   Temp 98.2 F (36.8 C) (Oral)   Resp 16   Ht 6\' 2"  (1.88 m)   Wt 174 lb (78.9 kg)   SpO2 99%   BMI 22.34 kg/m   BP Readings from Last 3 Encounters:  06/17/20 136/74  06/01/20 136/68  05/11/20 (!) 132/58    Wt Readings from Last 3 Encounters:  06/17/20 174 lb (78.9 kg)  06/01/20 174 lb (78.9 kg)  05/11/20 174 lb 3.2 oz (79 kg)    Physical Exam Vitals reviewed.  Constitutional:      Appearance: Normal appearance.  HENT:     Nose: Nose normal.     Mouth/Throat:     Mouth: Mucous membranes are moist.  Eyes:     General: No scleral icterus.    Conjunctiva/sclera: Conjunctivae normal.  Cardiovascular:     Rate and Rhythm: Normal rate and regular rhythm.     Heart sounds: No murmur heard.   Pulmonary:     Effort: Pulmonary effort is normal.     Breath sounds:  No stridor. No wheezing, rhonchi or rales.  Abdominal:     General: Abdomen is flat. Bowel sounds are normal. There is no distension.     Palpations: Abdomen is soft. There is no hepatomegaly, splenomegaly or mass.  Musculoskeletal:        General: Normal range of motion.     Cervical back: Neck supple.     Right lower leg: No edema.     Left lower leg: No edema.  Lymphadenopathy:     Cervical: No cervical adenopathy.  Skin:    General: Skin is warm and dry.     Coloration: Skin is not pale.  Neurological:      General: No focal deficit present.     Mental Status: He is alert.  Psychiatric:        Mood and Affect: Mood normal.        Behavior: Behavior normal.     Lab Results  Component Value Date   WBC 6.0 06/17/2020   HGB 15.5 06/17/2020   HCT 46.3 06/17/2020   PLT 199.0 06/17/2020   GLUCOSE 71 06/17/2020   CHOL 94 06/17/2020   TRIG 54.0 06/17/2020   HDL 47.60 06/17/2020   LDLCALC 36 06/17/2020   ALT 28 06/17/2020   AST 25 06/17/2020   NA 140 06/17/2020   K 4.1 06/17/2020   CL 102 06/17/2020   CREATININE 1.23 06/17/2020   BUN 18 06/17/2020   CO2 33 (H) 06/17/2020   TSH 0.67 05/07/2020   PSA 2.44 10/03/2019   HGBA1C 7.3 (H) 06/17/2020   MICROALBUR 2.2 (H) 06/17/2020    CT Temporal Bones W/O CM  Result Date: 07/18/2014 CLINICAL DATA:  BILATERAL chronic otitis media with severe hearing loss. EXAM: CT TEMPORAL BONES WITHOUT CONTRAST TECHNIQUE: Axial and coronal plane CT imaging of the petrous temporal bones was performed with thin-collimation image reconstruction. No intravenous contrast was administered. Multiplanar CT image reconstructions were also generated. COMPARISON:  None. FINDINGS: The RIGHT tympanic membrane is silhouetted by fluid and/or soft tissue in the RIGHT middle ear and fluid/soft tissue in the bony external canal, lateral to the ear drum. The osseous margins of the bony external canal appears slightly irregular, although the scutum is intact. There is marked disruption of the ossicular chain on the RIGHT. No recognizable malleo-incudal joint. Probable displacement of the stapes away from the oval window. There is soft tissue in the attic and thinning of the tegmen tympani. There is widening of the aditus ad antrum, coalescence of the mastoid air cells, and thinning or dehiscence of the tegmen mastoideum, all consistent with chronic and widespread cholesteatoma. RIGHT inner ear structures intact without take down of the bony margins of the semicircular canals. On the LEFT,  there is similar but less severe disease. The external canal is unremarkable. LEFT TM is retracted. Anatomic ossicular anatomy is observed without erosion or displacement of the malleus, incus, or stapes. Within the LEFT middle ear cavity, there is soft tissue/fluid inferiorly, but the attic remains aerated with normal-appearing tegmen tympani. Slight widening of the LEFT aditus ad antrum and coalescence of LEFT mastoid air cells but intact tegmen mastoideum. Normal LEFT inner ear structures. Unremarkable appearing noncontrast intracranial compartment. Normal visualized TMJs and extracranial soft tissues. No sinus air-fluid level is evident. IMPRESSION: Severe BILATERAL chronic otitis media with BILATERAL middle ear and mastoid cholesteatomas, RIGHT much worse than LEFT. 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 Similar/less severe changes on the LEFT.  Dehiscence of the tegmen tympani and tegmen mastoideum on the RIGHT is suspected. Within limits of visualization on noncontrast exam, no visible intracranial complication. Electronically Signed   By: Rolla Flatten M.D.   On: 07/18/2014 15:34    Assessment & Plan:   Tarron was seen today for hypertension, hyperlipidemia, hypothyroidism and diabetes.  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  Hemochromatosis, hereditary ([place])- His iron and ferritin levels are not significantly elevated. -     Iron -     Ferritin -     Hepatic function panel -     CBC with Differential/Platelet  Type 1 diabetes mellitus without complication ([place])- His blood sugar is adequately well controlled. -     Hemoglobin A1c -     Microalbumin / creatinine urine ratio  Hyperlipidemia LDL goal <70- He has achieved his LDL goal is doing well on the statin. -     Hepatic function  panel -     Lipid panel  Chronic renal impairment, stage 2 (mild)- His renal function has improved. -     Urinalysis, Routine w reflex microscopic -     Basic metabolic panel -     CBC with Differential/Platelet  Essential hypertension- His blood pressure is adequately well controlled. -     Urinalysis, Routine w reflex microscopic -     Basic metabolic panel -     CBC with Differential/Platelet   I am having Garland L. Coole maintain his aspirin, insulin NPH-regular Human, insulin glargine, amLODipine, rosuvastatin, triamterene-hydrochlorothiazide, levothyroxine, and irbesartan.  No orders of the defined types were placed in this encounter.    Follow-up: Return in about 6 months (around 12/15/2020).  Scarlette Calico, MD

## 2020-06-17 NOTE — Patient Instructions (Signed)

## 2020-06-21 ENCOUNTER — Other Ambulatory Visit: Payer: Self-pay | Admitting: Internal Medicine

## 2020-06-21 DIAGNOSIS — E039 Hypothyroidism, unspecified: Secondary | ICD-10-CM

## 2020-06-25 ENCOUNTER — Other Ambulatory Visit: Payer: Self-pay | Admitting: *Deleted

## 2020-06-25 NOTE — Patient Outreach (Signed)
[facility]) Care Management  06/25/2020  KIMMY PARISH [birth year] [phone]   RN Health Coach attempted follow up outreach call to patient.  Patient was unavailable. HIPPA compliance voicemail message left with return callback number.  Plan: RN will call patient again within 30 days.  [facility] [phone]

## 2020-07-12 ENCOUNTER — Other Ambulatory Visit: Payer: Self-pay | Admitting: Internal Medicine

## 2020-07-12 DIAGNOSIS — N182 Chronic kidney disease, stage 2 (mild): Secondary | ICD-10-CM

## 2020-07-12 DIAGNOSIS — E1051 Type 1 diabetes mellitus with diabetic peripheral angiopathy without gangrene: Secondary | ICD-10-CM

## 2020-07-12 DIAGNOSIS — E103599 Type 1 diabetes mellitus with proliferative diabetic retinopathy without macular edema, unspecified eye: Secondary | ICD-10-CM

## 2020-07-12 DIAGNOSIS — IMO0002 Reserved for concepts with insufficient information to code with codable children: Secondary | ICD-10-CM

## 2020-07-12 DIAGNOSIS — I10 Essential (primary) hypertension: Secondary | ICD-10-CM

## 2020-07-17 ENCOUNTER — Other Ambulatory Visit: Payer: Self-pay | Admitting: Internal Medicine

## 2020-07-17 DIAGNOSIS — I10 Essential (primary) hypertension: Secondary | ICD-10-CM

## 2020-07-27 ENCOUNTER — Other Ambulatory Visit: Payer: Self-pay | Admitting: *Deleted

## 2020-07-27 ENCOUNTER — Other Ambulatory Visit: Payer: Self-pay | Admitting: Internal Medicine

## 2020-07-27 ENCOUNTER — Telehealth: Payer: Self-pay | Admitting: Internal Medicine

## 2020-07-27 NOTE — Patient Outreach (Signed)
[facility]) Care Management  [place]  07/27/2020   Logan [name] [birth year] [id number]  [place] telephone call to patient.  [employer] compliance verified. Per patient his fasting blood sugar 85. His appetite is very good. Patient stated that he is walking 3-4 miles a day. Patient stated that he does not eat out. Patient is checking blood pressure 2 x day. Per patient he is taking her medications as per ordered. Patient has agreed to further outreach calls.    Encounter Medications:  Outpatient Encounter Medications as of 07/27/2020  Medication Sig Note  . amLODipine (NORVASC) 10 MG tablet Take 1 tablet by mouth once daily   . aspirin 81 MG tablet Take 81 mg by mouth 2 (two) times daily.   . insulin glargine (LANTUS) 100 UNIT/ML injection INJECT 0.2MLS INTO THE SKIN ONCE DAILY AT BEDTIME 03/19/2020: Reports taking 10-12 units daily  . insulin NPH-regular Human (NOVOLIN 70/30) (70-30) 100 UNIT/ML injection Inject 20 Units into the skin 2 (two) times daily with a meal. 03/19/2020: Reports taking 17 units twice a day  . irbesartan (AVAPRO) 150 MG tablet Take 1 tablet by mouth once daily   . levothyroxine (SYNTHROID) 100 MCG tablet Take 1 tablet by mouth once daily   . rosuvastatin (CRESTOR) 10 MG tablet Take 1 tablet (10 mg total) by mouth daily.   Marland Kitchen triamterene-hydrochlorothiazide (MAXZIDE-25) 37.5-25 MG tablet Take 1/2 (one-half) tablet by mouth once daily    No facility-administered encounter medications on file as of 07/27/2020.    Functional Status:  In your present state of health, do you have any difficulty performing the following activities: 06/01/2020  Hearing? N  Vision? N  Difficulty concentrating or making decisions? N  Walking or climbing stairs? N  Dressing or bathing? N  Doing errands, shopping? N  Some recent data might be hidden    Fall/Depression Screening: Fall Risk  06/01/2020 07/04/2018 07/03/2018  Falls in the past year? 0 0 0   Number falls in past yr: - - 0  Injury with Fall? - - 0  Follow up - - Falls evaluation completed   PHQ 2/9 Scores 03/19/2020 01/23/2019 07/03/2018 06/20/2017 03/30/2016 10/22/2014 01/08/2014  PHQ - 2 Score 0 0 0 0 0 0 0    Assessment:  Goals Addressed            This Visit's Progress   . [facility]) Learn More About My Health   On track    Timeframe:  Long-Range Goal Priority:  Medium Start Date:  03474259                   Expected End Date:     56387564                 Follow Up Date  33295188   - tell my story and reason for my visit - make a list of questions - ask questions - repeat what I heard to make sure I understand - bring a list of my medicines to the visit - speak up when I don't understand    Why is this important?   The best way to learn about your health and care is by talking to the doctor and nurse.  They will answer your questions and give you information in the way that you like best.    Notes:     . [facility]) Make and Keep All Appointments   On track    Timeframe:  Long-Range  Goal Priority:  Medium Start Date:    16109604                         Expected End Date:  54098119                    Follow Up Date 14782956   - ask family or friend for a ride - call to cancel if needed - keep a calendar with appointment dates    Why is this important?   Part of staying healthy is seeing the doctor for follow-up care.  If you forget your appointments, there are some things you can do to stay on track.    Notes:  Discuss with son need for cataract surgery and need for transportation from family or friends and schedule surgery soon as transportation can be arranged; attend scheduled endocrinology appointment  [id number] need to reschedule cataract    . [facility]) Monitor and Manage My Blood Sugar   On track    Timeframe:  Long-Range Goal Priority:  High Start Date:  84696295                           Expected End Date:      28413244                 Follow Up Date  01027253  - check blood sugar at prescribed times - check blood sugar before and after exercise - check blood sugar if I feel it is too high or too low - enter blood sugar readings and medication or insulin into daily log - take the blood sugar log to all doctor visits - take the blood sugar meter to all doctor visits    Why is this important?   Checking your blood sugar at home helps to keep it from getting very high or very low.  Writing the results in a diary or log helps the doctor know how to care for you.  Your blood sugar log should have the time, the date and the results.  Also, write down the amount of insulin or other medicine you take.  Other information like what you ate, exercise done and how you were feeling will also be helpful..     Notes:     . [facility]) Set My Target A1C   On track    Timeframe:  Long-Range Goal Priority:  High Start Date:  66440347                           Expected End Date:    42595638                  Follow Up Date 75643329   - set target A1C--Target 7.5; Current 9.2    Why is this important?   Your target A1C is decided together by you and your doctor.  It is based on several things like your age and other health issues.    Notes:  Goal now 7.0 and A1C is 7.3 now down form 9.2    . ([facility])Monitor and Manage My Blood Sugar-Diabetes Type 2   On track    Timeframe:  Long-Range Goal Priority:  High Start Date:    51884166                         Expected  End Date:       81017510                Follow Up Date 25852778   - check blood sugar at prescribed times - check blood sugar before and after exercise - check blood sugar if I feel it is too high or too low - take the blood sugar meter to all doctor visits    Why is this important?    Checking your blood sugar at home helps to keep it from getting very high or very low.   Writing the results in a diary or log helps the doctor know how to care for you.   Your blood sugar log should  have the time, date and the results.   Also, write down the amount of insulin or other medicine that you take.   Other information, like what you ate, exercise done and how you were feeling, will also be helpful.     Notes:  [id number]    . ([facility] Exam-Diabetes Type 2   On track    Timeframe:  Long-Range Goal Priority:  Medium Start Date:        07/27/2020                     Expected End Date:    44315400                  Follow Up Date 86761950   - keep appointment with eye doctor    Why is this important?    Eye check-ups are important when you have diabetes.   Vision loss can be prevented.    Notes:  93267124 last eye exam was 09/27/2019    . ([facility])Set My Target A1C-Diabetes Type 2   On track    Timeframe:  Long-Range Goal Priority:  High Start Date:     58099833                        Expected End Date:      82505397                 Follow Up Date 67341937  - set target A1C    Why is this important?    Your target A1C is decided together by you and your doctor.   It is based on several things like your age and other health issues.    Notes:  7.0 goal    . [facility]) Perform Foot Care   On track    Timeframe:  Long-Range Goal Priority:  Medium Start Date:   90240973                          Expected End Date: 53299242                     Follow Up Date 68341962  - check feet daily for cuts, sores or redness - wash and dry feet carefully every day - wear comfortable, cotton socks - wear comfortable, well-fitting shoes    Why is this important?   Good foot care is very important when you have diabetes.  There are many things you can do to keep your feet healthy and catch a problem early.    Notes:  22979892 Per patient his feet look good. He has some neuropathy       Plan:  Follow-up:  Patient agrees  to Care Plan and Follow-up. Provided education on F.A.S.T. acronym  for stroke sheet Patient will continue to exercise 3-4 x a week Patient will  follow up with getting a Free style libre replacement RN will follow up within the month of August RN will send an update assessment to PCP  [place] Management [phone]

## 2020-07-27 NOTE — Telephone Encounter (Signed)
What is he taking for BP control?  TJ

## 2020-07-27 NOTE — Patient Instructions (Signed)
Goals Addressed            This Visit's Progress   . [facility]) Learn More About My Health       Timeframe:  Long-Range Goal Priority:  Medium Start Date:  08144818                   Expected End Date:     56314970                 Follow Up Date  26378588   - tell my story and reason for my visit - make a list of questions - ask questions - repeat what I heard to make sure I understand - bring a list of my medicines to the visit - speak up when I don't understand    Why is this important?   The best way to learn about your health and care is by talking to the doctor and nurse.  They will answer your questions and give you information in the way that you like best.    Notes:     . [facility]) Make and Keep All Appointments       Timeframe:  Long-Range Goal Priority:  Medium Start Date:    50277412                         Expected End Date:  87867672                    Follow Up Date 09470962   - ask family or friend for a ride - call to cancel if needed - keep a calendar with appointment dates    Why is this important?   Part of staying healthy is seeing the doctor for follow-up care.  If you forget your appointments, there are some things you can do to stay on track.    Notes:  Discuss with son need for cataract surgery and need for transportation from family or friends and schedule surgery soon as transportation can be arranged; attend scheduled endocrinology appointment  [id number] need to reschedule cataract    . [facility]) Monitor and Manage My Blood Sugar       Timeframe:  Long-Range Goal Priority:  High Start Date:  65465035                           Expected End Date:      46568127                 Follow Up Date 51700174  - check blood sugar at prescribed times - check blood sugar before and after exercise - check blood sugar if I feel it is too high or too low - enter blood sugar readings and medication or insulin into daily log - take the blood sugar log to all doctor  visits - take the blood sugar meter to all doctor visits    Why is this important?   Checking your blood sugar at home helps to keep it from getting very high or very low.  Writing the results in a diary or log helps the doctor know how to care for you.  Your blood sugar log should have the time, the date and the results.  Also, write down the amount of insulin or other medicine you take.  Other information like what you ate, exercise done and how you were feeling will also be helpful.Marland Kitchen  Notes:     Marland Kitchen [facility]) Set My Target A1C   On track    Timeframe:  Long-Range Goal Priority:  High Start Date:  44010272                           Expected End Date:    53664403                  Follow Up Date 47425956   - set target A1C--Target 7.5; Current 9.2    Why is this important?   Your target A1C is decided together by you and your doctor.  It is based on several things like your age and other health issues.    Notes:  Goal now 7.0 and A1C is 7.3 now down form 9.2    . ([facility])Monitor and Manage My Blood Sugar-Diabetes Type 2       Timeframe:  Long-Range Goal Priority:  High Start Date:    38756433                         Expected End Date:       29518841                Follow Up Date 66063016   - check blood sugar at prescribed times - check blood sugar before and after exercise - check blood sugar if I feel it is too high or too low - take the blood sugar meter to all doctor visits    Why is this important?    Checking your blood sugar at home helps to keep it from getting very high or very low.   Writing the results in a diary or log helps the doctor know how to care for you.   Your blood sugar log should have the time, date and the results.   Also, write down the amount of insulin or other medicine that you take.   Other information, like what you ate, exercise done and how you were feeling, will also be helpful.     Notes:  01093235    . [facility] Eye Exam-Diabetes  Type 2       Timeframe:  Long-Range Goal Priority:  Medium Start Date:        07/27/2020                     Expected End Date:    57322025                  Follow Up Date 42706237   - keep appointment with eye doctor    Why is this important?    Eye check-ups are important when you have diabetes.   Vision loss can be prevented.    Notes:  [id number] last eye exam was 09/27/2019    . ([facility])Set My Target A1C-Diabetes Type 2       Timeframe:  Long-Range Goal Priority:  High Start Date:     61607371                        Expected End Date:      06269485                 Follow Up Date 46270350  - set target A1C    Why is this important?    Your target A1C is decided together by you and your  doctor.   It is based on several things like your age and other health issues.    Notes:  7.0 goal    . [facility]) Perform Foot Care       Timeframe:  Long-Range Goal Priority:  Medium Start Date:   17793903                          Expected End Date: 00923300                     Follow Up Date 76226333  - check feet daily for cuts, sores or redness - wash and dry feet carefully every day - wear comfortable, cotton socks - wear comfortable, well-fitting shoes    Why is this important?   Good foot care is very important when you have diabetes.  There are many things you can do to keep your feet healthy and catch a problem early.    Notes:  54562563 Per patient his feet look good. He has some neuropathy

## 2020-07-27 NOTE — Telephone Encounter (Signed)
Patient's last BP reading have been higher than normal. This morning 151/71 and the last reading 161/72. Feels disoriented but denies any other symptoms. Patient is scheduled for an appointment on 3.15.22  Please advise.

## 2020-07-28 ENCOUNTER — Other Ambulatory Visit: Payer: Self-pay | Admitting: Internal Medicine

## 2020-07-28 NOTE — Telephone Encounter (Signed)
Called pt, LVM.   

## 2020-07-28 NOTE — Telephone Encounter (Signed)
Why is he not taking MAXZIDE?  Logan [name]

## 2020-07-28 NOTE — Telephone Encounter (Signed)
Pt stated the he has been taking the irbesartan and amlodipine.

## 2020-07-31 NOTE — Telephone Encounter (Signed)
Patient calling back, if you could please give him a call back

## 2020-07-31 NOTE — Telephone Encounter (Signed)
Pt stated that he has been taking that medication also. Please advise.

## 2020-08-04 NOTE — Telephone Encounter (Signed)
He should come in for a BP check

## 2020-08-05 NOTE — Telephone Encounter (Signed)
Pt stated that he would keep his 3/15 appt.

## 2020-08-18 ENCOUNTER — Ambulatory Visit (INDEPENDENT_AMBULATORY_CARE_PROVIDER_SITE_OTHER): Payer: Medicare Other | Admitting: Internal Medicine

## 2020-08-18 ENCOUNTER — Other Ambulatory Visit: Payer: Self-pay

## 2020-08-18 ENCOUNTER — Encounter: Payer: Self-pay | Admitting: Internal Medicine

## 2020-08-18 VITALS — BP 158/70 | HR 75 | Temp 98.3°F | Resp 16 | Ht 74.0 in | Wt 172.0 lb

## 2020-08-18 DIAGNOSIS — I70209 Unspecified atherosclerosis of native arteries of extremities, unspecified extremity: Secondary | ICD-10-CM | POA: Diagnosis not present

## 2020-08-18 DIAGNOSIS — E1051 Type 1 diabetes mellitus with diabetic peripheral angiopathy without gangrene: Secondary | ICD-10-CM

## 2020-08-18 DIAGNOSIS — E039 Hypothyroidism, unspecified: Secondary | ICD-10-CM

## 2020-08-18 DIAGNOSIS — I10 Essential (primary) hypertension: Secondary | ICD-10-CM | POA: Diagnosis not present

## 2020-08-18 DIAGNOSIS — E109 Type 1 diabetes mellitus without complications: Secondary | ICD-10-CM

## 2020-08-18 LAB — BASIC METABOLIC PANEL
BUN: 14 mg/dL (ref 6–23)
CO2: 33 mEq/L — ABNORMAL HIGH (ref 19–32)
Calcium: 9.6 mg/dL (ref 8.4–10.5)
Chloride: 101 mEq/L (ref 96–112)
Creatinine, Ser: 1.2 mg/dL (ref 0.40–1.50)
GFR: 61.69 mL/min (ref >60.00)
Glucose, Bld: 50 mg/dL — ABNORMAL LOW (ref 70–99)
Potassium: 3.8 mEq/L (ref 3.5–5.1)
Sodium: 140 mEq/L (ref 135–145)

## 2020-08-18 LAB — TSH: TSH: 1.8 u[IU]/mL (ref 0.35–4.50)

## 2020-08-18 MED ORDER — NEBIVOLOL HCL 5 MG PO TABS: 5.0000 mg | ORAL_TABLET | Freq: Every day | ORAL | 0 refills | Status: DC

## 2020-08-18 MED ORDER — GVOKE HYPOPEN 2-PACK 1 MG/0.2ML ~~LOC~~ SOAJ: 1.0000 | Freq: Every day | SUBCUTANEOUS | 6 refills | Status: DC | PRN

## 2020-08-18 NOTE — Progress Notes (Signed)
Subjective:  Patient ID: Logan [name], male    DOB: [birth year]  Age: 70 y.o. MRN: [id number]  CC: Hypertension and Hypothyroidism  This visit occurred during the SARS-CoV-2 public health emergency.  Safety protocols were in place, including screening questions prior to the visit, additional usage of staff PPE, and extensive cleaning of exam room while observing appropriate contact time as indicated for disinfecting solutions.    HPI Logan [name] presents for f/up - He complains that his BP is rarely <140/90 on his home BP monitor. He tells me that he is compliant with the 3 antihypertensives. He walks 4 miles/day and does not experience CP, DOE, palpitations, edema, fatigue.  Outpatient Medications Prior to Visit  Medication Sig Dispense Refill  . amLODipine (NORVASC) 10 MG tablet Take 1 tablet by mouth once daily 90 tablet 1  . aspirin 81 MG tablet Take 81 mg by mouth 2 (two) times daily.    . insulin glargine (LANTUS) 100 UNIT/ML injection INJECT 0.2MLS INTO THE SKIN ONCE DAILY AT BEDTIME 30 mL 2  . insulin NPH-regular Human (NOVOLIN 70/30) (70-30) 100 UNIT/ML injection Inject 20 Units into the skin 2 (two) times daily with a meal. 30 mL 11  . irbesartan (AVAPRO) 150 MG tablet Take 1 tablet by mouth once daily 90 tablet 1  . levothyroxine (SYNTHROID) 100 MCG tablet Take 1 tablet by mouth once daily 90 tablet 1  . rosuvastatin (CRESTOR) 10 MG tablet Take 1 tablet (10 mg total) by mouth daily. 90 tablet 1  . triamterene-hydrochlorothiazide (MAXZIDE-25) 37.5-25 MG tablet Take 1/2 (one-half) tablet by mouth once daily 45 tablet 1   No facility-administered medications prior to visit.    ROS Review of Systems  Constitutional: Negative.  Negative for diaphoresis and fatigue.  HENT: Negative.   Respiratory: Negative.  Negative for cough, chest tightness, shortness of breath and wheezing.   Cardiovascular: Negative for chest pain, palpitations and leg swelling.  Gastrointestinal:  Negative for abdominal pain, constipation, diarrhea, nausea and vomiting.  Endocrine: Negative.   Genitourinary: Negative.  Negative for difficulty urinating.  Musculoskeletal: Negative for arthralgias, myalgias and neck pain.  Skin: Negative.  Negative for color change.  Neurological: Negative.  Negative for weakness, light-headedness, numbness and headaches.  Hematological: Negative for adenopathy. Does not bruise/bleed easily.  Psychiatric/Behavioral: Negative.     Objective:  BP (!) 158/70   Pulse 75   Temp 98.3 F (36.8 C) (Oral)   Resp 16   Ht 6\' 2"  (1.88 m)   Wt 172 lb (78 kg)   SpO2 97%   BMI 22.08 kg/m   BP Readings from Last 3 Encounters:  08/18/20 (!) 158/70  06/17/20 136/74  06/01/20 136/68    Wt Readings from Last 3 Encounters:  08/18/20 172 lb (78 kg)  06/17/20 174 lb (78.9 kg)  06/01/20 174 lb (78.9 kg)    Physical Exam Vitals reviewed.  Constitutional:      Appearance: Normal appearance.  HENT:     Nose: Nose normal.     Mouth/Throat:     Mouth: Mucous membranes are moist.  Eyes:     General: No scleral icterus.    Conjunctiva/sclera: Conjunctivae normal.  Cardiovascular:     Rate and Rhythm: Normal rate and regular rhythm.     Heart sounds: No murmur heard.   Pulmonary:     Effort: Pulmonary effort is normal.     Breath sounds: No stridor. No wheezing, rhonchi or rales.  Abdominal:  General: Abdomen is flat. Bowel sounds are normal. There is no distension.     Palpations: Abdomen is soft.     Tenderness: There is no abdominal tenderness.  Musculoskeletal:        General: Normal range of motion.     Cervical back: Neck supple.     Right lower leg: No edema.     Left lower leg: No edema.  Lymphadenopathy:     Cervical: No cervical adenopathy.  Skin:    General: Skin is warm and dry.     Coloration: Skin is not pale.  Neurological:     General: No focal deficit present.     Mental Status: He is alert.  Psychiatric:        Mood  and Affect: Mood normal.     Lab Results  Component Value Date   WBC 6.0 06/17/2020   HGB 15.5 06/17/2020   HCT 46.3 06/17/2020   PLT 199.0 06/17/2020   GLUCOSE 50 (L) 08/18/2020   CHOL 94 06/17/2020   TRIG 54.0 06/17/2020   HDL 47.60 06/17/2020   LDLCALC 36 06/17/2020   ALT 28 06/17/2020   AST 25 06/17/2020   NA 140 08/18/2020   K 3.8 08/18/2020   CL 101 08/18/2020   CREATININE 1.20 08/18/2020   BUN 14 08/18/2020   CO2 33 (H) 08/18/2020   TSH 1.80 08/18/2020   PSA 2.44 10/03/2019   HGBA1C 7.3 (H) 06/17/2020   MICROALBUR 2.2 (H) 06/17/2020    CT Temporal Bones W/O CM  Result Date: 07/18/2014 CLINICAL DATA:  BILATERAL chronic otitis media with severe hearing loss. EXAM: CT TEMPORAL BONES WITHOUT CONTRAST TECHNIQUE: Axial and coronal plane CT imaging of the petrous temporal bones was performed with thin-collimation image reconstruction. No intravenous contrast was administered. Multiplanar CT image reconstructions were also generated. COMPARISON:  None. FINDINGS: The RIGHT tympanic membrane is silhouetted by fluid and/or soft tissue in the RIGHT middle ear and fluid/soft tissue in the bony external canal, lateral to the ear drum. The osseous margins of the bony external canal appears slightly irregular, although the scutum is intact. There is marked disruption of the ossicular chain on the RIGHT. No recognizable malleo-incudal joint. Probable displacement of the stapes away from the oval window. There is soft tissue in the attic and thinning of the tegmen tympani. There is widening of the aditus ad antrum, coalescence of the mastoid air cells, and thinning or dehiscence of the tegmen mastoideum, all consistent with chronic and widespread cholesteatoma. RIGHT inner ear structures intact without take down of the bony margins of the semicircular canals. On the LEFT, there is similar but less severe disease. The external canal is unremarkable. LEFT TM is retracted. Anatomic ossicular anatomy  is observed without erosion or displacement of the malleus, incus, or stapes. Within the LEFT middle ear cavity, there is soft tissue/fluid inferiorly, but the attic remains aerated with normal-appearing tegmen tympani. Slight widening of the LEFT aditus ad antrum and coalescence of LEFT mastoid air cells but intact tegmen mastoideum. Normal LEFT inner ear structures. Unremarkable appearing noncontrast intracranial compartment. Normal visualized TMJs and extracranial soft tissues. No sinus air-fluid level is evident. IMPRESSION: Severe BILATERAL chronic otitis media with BILATERAL middle ear and mastoid cholesteatomas, RIGHT much worse than LEFT. 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 Similar/less severe changes on the LEFT. Dehiscence of the tegmen tympani and tegmen mastoideum on the RIGHT is suspected. Within limits of visualization on noncontrast  exam, no visible intracranial complication. Electronically Signed   By: Rolla Flatten M.D.   On: 07/18/2014 15:34    Assessment & Plan:   Delvecchio was seen today for hypertension and hypothyroidism.  Diagnoses and all orders for this visit:  Essential hypertension- His BP is not adequately well controlled. Will add nebivolol to his current regimen. -     Basic metabolic panel; Future -     nebivolol (BYSTOLIC) 5 MG tablet; Take 1 tablet (5 mg total) by mouth daily. -     Basic metabolic panel  Acquired hypothyroidism- His TSH is in the normal range. He will stay on the current T4 dose. -     TSH; Future -     TSH  Type 1 diabetes mellitus without complication (HCC) -     Glucagon (GVOKE HYPOPEN 2-PACK) 1 MG/0.2ML SOAJ; Inject 1 Act into the skin daily as needed.   I am having Itai L. Wyse start on nebivolol and Gvoke HypoPen 2-Pack. I am also having him maintain his aspirin, insulin NPH-regular Human, insulin glargine, rosuvastatin, triamterene-hydrochlorothiazide,  levothyroxine, irbesartan, and amLODipine.  Meds ordered this encounter  Medications  . nebivolol (BYSTOLIC) 5 MG tablet    Sig: Take 1 tablet (5 mg total) by mouth daily.    Dispense:  90 tablet    Refill:  0  . Glucagon (GVOKE HYPOPEN 2-PACK) 1 MG/0.2ML SOAJ    Sig: Inject 1 Act into the skin daily as needed.    Dispense:  2 mL    Refill:  6     Follow-up: Return in about 3 months (around 11/18/2020).  Scarlette Calico, MD

## 2020-08-18 NOTE — Patient Instructions (Signed)

## 2020-09-06 ENCOUNTER — Other Ambulatory Visit: Payer: Self-pay | Admitting: Internal Medicine

## 2020-09-06 DIAGNOSIS — E10319 Type 1 diabetes mellitus with unspecified diabetic retinopathy without macular edema: Secondary | ICD-10-CM

## 2020-09-06 DIAGNOSIS — E10311 Type 1 diabetes mellitus with unspecified diabetic retinopathy with macular edema: Secondary | ICD-10-CM

## 2020-09-06 DIAGNOSIS — IMO0002 Reserved for concepts with insufficient information to code with codable children: Secondary | ICD-10-CM

## 2020-09-06 DIAGNOSIS — E1051 Type 1 diabetes mellitus with diabetic peripheral angiopathy without gangrene: Secondary | ICD-10-CM

## 2020-09-09 DIAGNOSIS — Z23 Encounter for immunization: Secondary | ICD-10-CM | POA: Diagnosis not present

## 2020-09-15 ENCOUNTER — Other Ambulatory Visit: Payer: Self-pay | Admitting: Internal Medicine

## 2020-09-21 ENCOUNTER — Other Ambulatory Visit: Payer: Self-pay

## 2020-09-21 ENCOUNTER — Ambulatory Visit (INDEPENDENT_AMBULATORY_CARE_PROVIDER_SITE_OTHER): Payer: Medicare Other | Admitting: Internal Medicine

## 2020-09-21 ENCOUNTER — Encounter: Payer: Self-pay | Admitting: Internal Medicine

## 2020-09-21 VITALS — BP 150/80 | HR 55 | Ht 74.0 in | Wt 176.0 lb

## 2020-09-21 DIAGNOSIS — E1051 Type 1 diabetes mellitus with diabetic peripheral angiopathy without gangrene: Secondary | ICD-10-CM | POA: Diagnosis not present

## 2020-09-21 DIAGNOSIS — I10 Essential (primary) hypertension: Secondary | ICD-10-CM | POA: Diagnosis not present

## 2020-09-21 DIAGNOSIS — E7800 Pure hypercholesterolemia, unspecified: Secondary | ICD-10-CM | POA: Insufficient documentation

## 2020-09-21 DIAGNOSIS — R1012 Left upper quadrant pain: Secondary | ICD-10-CM | POA: Insufficient documentation

## 2020-09-21 DIAGNOSIS — E11319 Type 2 diabetes mellitus with unspecified diabetic retinopathy without macular edema: Secondary | ICD-10-CM | POA: Insufficient documentation

## 2020-09-21 DIAGNOSIS — I70209 Unspecified atherosclerosis of native arteries of extremities, unspecified extremity: Secondary | ICD-10-CM | POA: Diagnosis not present

## 2020-09-21 MED ORDER — IRBESARTAN 300 MG PO TABS: 300.0000 mg | ORAL_TABLET | Freq: Every day | ORAL | 3 refills | Status: DC

## 2020-09-21 NOTE — Patient Instructions (Signed)
Ok to stop the bystolic as the 5 mg dose is not helping  As you mentioned to keep the number of pills lower, it should be ok to increase the irbesartan (avapro) to 300 mg per day  Please make sure to follow up in 1-2 wks with Dr Ronnald Ramp for BP check and for blood testing for the kidneys and potassium again as you did in march 2022  Please continue all other medications as before, and refills have been done if requested.  Please have the pharmacy call with any other refills you may need.  Please continue your efforts at being more active, low cholesterol diet, and weight control.  Please keep your appointments with your specialists as you may have planned  You will be contacted regarding the referral for: CT scan to rule out pancreas or spleen problem (splenic infarct)

## 2020-09-21 NOTE — Assessment & Plan Note (Signed)
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

## 2020-09-21 NOTE — Assessment & Plan Note (Addendum)
BP Readings from Last 3 Encounters:  09/21/20 (!) 150/80  08/18/20 (!) 158/70  06/17/20 136/74   Uncontrolled, pt states no improvement with lower dose bystolic, request change of med- for increased avapro to 300 qd, and f/u PCP with recommendation for f/u BMP at 2-3 wks

## 2020-09-21 NOTE — Progress Notes (Signed)
Patient ID: Logan [name], male   DOB: Nov 26, 1950, 70 y.o.   MRN: [id number]        Chief Complaint: LUQ pain, HTN, DM       HPI:  Logan [name] is a 70 y.o. male here with c/o 1 mo onset persistent mild to mod LUQ pain with some soreness to palpation as well, very unusual for him and just not resolving, not really worse with climbing ladders at work, nothing else seems to make better or worse, including any certain type of food or drink.  Believes may have pancreas problem but only b/c not sure else to say.  Denies worsening reflux, dysphagia, n/v, bowel change or blood.  Pt denies chest pain, increased sob or doe, wheezing, orthopnea, PND, increased LE swelling, palpitations, dizziness or syncope. Denies focal neuro s/s   Pt denies fever, wt loss, night sweats, loss of appetite, or other constitutional symptoms   Pt denies polydipsia, polyuria,       Wt Readings from Last 3 Encounters:  09/21/20 176 lb (79.8 kg)  08/18/20 172 lb (78 kg)  06/17/20 174 lb (78.9 kg)   BP Readings from Last 3 Encounters:  09/21/20 (!) 150/80  08/18/20 (!) 158/70  06/17/20 136/74         Past Medical History:  Diagnosis Date  . Cataract   . Coronary artery disease involving native coronary artery of native heart without angina pectoris 11/01/2007   Last Cath '04: LAD w/ 60% long lesion; 1st Dx 70% ostial; 2nd Dx  70% ostial; Cx 30% ostial; RCA-dominant multiple 30-40% lesions; PDA 30% lesions  Last nuclear stress March '09 - negative for ischemia   . Diabetes mellitus   . Diabetic retinopathy   . Hearing loss   . Hemochromatosis   . Hyperlipidemia   . Hypertension    Past Surgical History:  Procedure Laterality Date  . 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 INNER EAR SURGERY     right    reports that he has never smoked. He has never used smokeless  tobacco. He reports current alcohol use. He reports that he does not use drugs. family history includes Alcohol abuse in his father; Asthma in his mother; Cancer in his father; Colon cancer in his maternal uncle; Emphysema in his mother; Heart failure in his mother; Stroke in his father. No Known Allergies Current Outpatient Medications on File Prior to Visit  Medication Sig Dispense Refill  . amLODipine (NORVASC) 10 MG tablet Take 1 tablet by mouth once daily 90 tablet 1  . aspirin 81 MG tablet Take 81 mg by mouth 2 (two) times daily.    . insulin glargine (LANTUS) 100 UNIT/ML injection INJECT 20 UNITS SUBCUTANEOUSLY ONCE DAILY AT BEDTIME 30 mL 0  . insulin NPH-regular Human (NOVOLIN 70/30) (70-30) 100 UNIT/ML injection Inject 20 Units into the skin 2 (two) times daily with a meal. 30 mL 11  . levothyroxine (SYNTHROID) 100 MCG tablet Take 1 tablet by mouth once daily 90 tablet 1  . rosuvastatin (CRESTOR) 10 MG tablet Take 1 tablet by mouth once daily 90 tablet 2  . triamterene-hydrochlorothiazide (MAXZIDE-25) 37.5-25 MG tablet Take 1/2 (one-half) tablet by mouth once daily 45 tablet 2  . aspirin (ASPIRIN 81) 81 MG EC tablet     . Continuous Blood Gluc Receiver (FREESTYLE LIBRE 14  DAY READER) DEVI     . Continuous Blood Gluc Sensor (FREESTYLE LIBRE 14 DAY SENSOR) MISC     . Glucagon (GVOKE HYPOPEN 2-PACK) 1 MG/0.2ML SOAJ Inject 1 Act into the skin daily as needed. (Patient not taking: Reported on 09/21/2020) 2 mL 6   No current facility-administered medications on file prior to visit.        ROS:  All others reviewed and negative.  Objective        PE:  BP (!) 150/80 (BP Location: Left Arm, Patient Position: Sitting, Cuff Size: Normal)   Pulse (!) 55   Ht 6\' 2"  (1.88 m)   Wt 176 lb (79.8 kg)   SpO2 97%   BMI 22.60 kg/m                 Constitutional: Pt appears in NAD               HENT: Head: NCAT.                Right Ear: External ear normal.                 Left Ear: External ear  normal.                Eyes: . Pupils are equal, round, and reactive to light. Conjunctivae and EOM are normal               Nose: without d/c or deformity               Neck: Neck supple. Gross normal ROM               Cardiovascular: Normal rate and regular rhythm.                 Pulmonary/Chest: Effort normal and breath sounds without rales or wheezing.                Abd:  Soft,mild LUQ tender, no guarding or rebound, ND, + BS, no organomegaly               Neurological: Pt is alert. At baseline orientation, motor grossly intact               Skin: Skin is warm. No rashes, no other new lesions, LE edema - none               Psychiatric: Pt behavior is normal without agitation   Micro: none  Cardiac tracings I have personally interpreted today:  none  Pertinent Radiological findings (summarize): none   Lab Results  Component Value Date   WBC 6.0 06/17/2020   HGB 15.5 06/17/2020   HCT 46.3 06/17/2020   PLT 199.0 06/17/2020   GLUCOSE 50 (L) 08/18/2020   CHOL 94 06/17/2020   TRIG 54.0 06/17/2020   HDL 47.60 06/17/2020   LDLCALC 36 06/17/2020   ALT 28 06/17/2020   AST 25 06/17/2020   NA 140 08/18/2020   K 3.8 08/18/2020   CL 101 08/18/2020   CREATININE 1.20 08/18/2020   BUN 14 08/18/2020   CO2 33 (H) 08/18/2020   TSH 1.80 08/18/2020   PSA 2.44 10/03/2019   HGBA1C 7.3 (H) 06/17/2020   MICROALBUR 2.2 (H) 06/17/2020   Assessment/Plan:  Logan [name] is a 70 y.o. White or Caucasian [1] male with  has a past medical history of Cataract, Coronary artery disease involving native coronary artery of native heart without angina pectoris (11/01/2007), Diabetes mellitus,  Diabetic retinopathy, Hearing loss, Hemochromatosis, Hyperlipidemia, and Hypertension.  LUQ pain Acute persistent mild without obvious cause, etiology unclear, cant r/o splenic infarct vs other - for CT abd pelvis  Essential hypertension BP Readings from Last 3 Encounters:  09/21/20 (!) 150/80  08/18/20 (!)  158/70  06/17/20 136/74   Uncontrolled, pt states no improvement with lower dose bystolic, request change of med- for increased avapro to 300 qd, and f/u PCP with recommendation for f/u BMP at 2-3 wks  Followup: Return 1-2 wks with PCP.  Cathlean Cower, MD 09/21/2020 9:40 PM [facility]

## 2020-10-06 ENCOUNTER — Ambulatory Visit
Admission: RE | Admit: 2020-10-06 | Discharge: 2020-10-06 | Disposition: A | Discharge status: Home / Self Care | Payer: Medicare Other | Source: Ambulatory Visit | Attending: Internal Medicine | Admitting: Internal Medicine

## 2020-10-06 DIAGNOSIS — R1012 Left upper quadrant pain: Secondary | ICD-10-CM

## 2020-10-06 DIAGNOSIS — R109 Unspecified abdominal pain: Secondary | ICD-10-CM | POA: Diagnosis not present

## 2020-10-09 ENCOUNTER — Encounter: Payer: Self-pay | Admitting: Internal Medicine

## 2020-10-09 DIAGNOSIS — I700 Atherosclerosis of aorta: Secondary | ICD-10-CM | POA: Insufficient documentation

## 2020-10-12 ENCOUNTER — Ambulatory Visit (INDEPENDENT_AMBULATORY_CARE_PROVIDER_SITE_OTHER): Payer: Medicare Other | Admitting: Internal Medicine

## 2020-10-12 ENCOUNTER — Other Ambulatory Visit: Payer: Self-pay

## 2020-10-12 ENCOUNTER — Encounter: Payer: Self-pay | Admitting: Internal Medicine

## 2020-10-12 VITALS — BP 138/70 | HR 78 | Temp 97.8°F | Ht 74.0 in | Wt 168.0 lb

## 2020-10-12 DIAGNOSIS — I70209 Unspecified atherosclerosis of native arteries of extremities, unspecified extremity: Secondary | ICD-10-CM

## 2020-10-12 DIAGNOSIS — R1013 Epigastric pain: Secondary | ICD-10-CM | POA: Diagnosis not present

## 2020-10-12 DIAGNOSIS — I10 Essential (primary) hypertension: Secondary | ICD-10-CM

## 2020-10-12 DIAGNOSIS — E109 Type 1 diabetes mellitus without complications: Secondary | ICD-10-CM | POA: Diagnosis not present

## 2020-10-12 DIAGNOSIS — E1051 Type 1 diabetes mellitus with diabetic peripheral angiopathy without gangrene: Secondary | ICD-10-CM | POA: Diagnosis not present

## 2020-10-12 LAB — CBC WITH DIFFERENTIAL/PLATELET
Basophils Absolute: 0.1 10*3/uL (ref 0.0–0.1)
Basophils Relative: 1.1 % (ref 0.0–3.0)
Eosinophils Absolute: 0.5 10*3/uL (ref 0.0–0.7)
Eosinophils Relative: 5.9 % — ABNORMAL HIGH (ref 0.0–5.0)
HCT: 46.5 % (ref 39.0–52.0)
Hemoglobin: 15.8 g/dL (ref 13.0–17.0)
Lymphocytes Relative: 23 % (ref 12.0–46.0)
Lymphs Abs: 1.9 10*3/uL (ref 0.7–4.0)
MCHC: 34 g/dL (ref 30.0–36.0)
MCV: 93.6 fl (ref 78.0–100.0)
Monocytes Absolute: 0.8 10*3/uL (ref 0.1–1.0)
Monocytes Relative: 9.7 % (ref 3.0–12.0)
Neutro Abs: 5 10*3/uL (ref 1.4–7.7)
Neutrophils Relative %: 60.3 % (ref 43.0–77.0)
Platelets: 179 10*3/uL (ref 150.0–400.0)
RBC: 4.97 Mil/uL (ref 4.22–5.81)
RDW: 14.2 % (ref 11.5–15.5)
WBC: 8.3 10*3/uL (ref 4.0–10.5)

## 2020-10-12 LAB — URINALYSIS, ROUTINE W REFLEX MICROSCOPIC
Bilirubin Urine: NEGATIVE
Ketones, ur: NEGATIVE
Leukocytes,Ua: NEGATIVE
Nitrite: NEGATIVE
Specific Gravity, Urine: 1.02 (ref 1.000–1.030)
Urine Glucose: NEGATIVE
Urobilinogen, UA: 1 (ref 0.0–1.0)
pH: 6 (ref 5.0–8.0)

## 2020-10-12 LAB — HEPATIC FUNCTION PANEL
ALT: 33 U/L (ref 0–53)
AST: 33 U/L (ref 0–37)
Albumin: 4.3 g/dL (ref 3.5–5.2)
Alkaline Phosphatase: 144 U/L — ABNORMAL HIGH (ref 39–117)
Bilirubin, Direct: 0.2 mg/dL (ref 0.0–0.3)
Total Bilirubin: 0.8 mg/dL (ref 0.2–1.2)
Total Protein: 7.2 g/dL (ref 6.0–8.3)

## 2020-10-12 LAB — BASIC METABOLIC PANEL
BUN: 18 mg/dL (ref 6–23)
CO2: 30 mEq/L (ref 19–32)
Calcium: 9.6 mg/dL (ref 8.4–10.5)
Chloride: 102 mEq/L (ref 96–112)
Creatinine, Ser: 1.24 mg/dL (ref 0.40–1.50)
GFR: 59.25 mL/min — ABNORMAL LOW (ref >60.00)
Glucose, Bld: 108 mg/dL — ABNORMAL HIGH (ref 70–99)
Potassium: 4 mEq/L (ref 3.5–5.1)
Sodium: 140 mEq/L (ref 135–145)

## 2020-10-12 LAB — LIPASE: Lipase: 10 U/L — ABNORMAL LOW (ref 11.0–59.0)

## 2020-10-12 LAB — HEMOGLOBIN A1C: Hgb A1c MFr Bld: 7.8 % — ABNORMAL HIGH (ref 4.6–6.5)

## 2020-10-12 LAB — C-REACTIVE PROTEIN: CRP: <1 mg/dL (ref 0.5–20.0)

## 2020-10-12 NOTE — Progress Notes (Signed)
Subjective:  Patient ID: Logan [name], male    DOB: June 16, 1950  Age: 70 y.o. MRN: [id number]  CC: Abdominal Pain  This visit occurred during the SARS-CoV-2 public health emergency.  Safety protocols were in place, including screening questions prior to the visit, additional usage of staff PPE, and extensive cleaning of exam room while observing appropriate contact time as indicated for disinfecting solutions.    HPI Logan [name] presents for f/up -  He recently saw another provider for abdominal pain in his left upper abdomen.  He describes a persistent but improving soreness in the epigastric and left upper quadrant region region.  He says his appetite is good but he is losing weight.  He denies odynophagia, dysphagia, nausea, vomiting, melena, or bright red blood per rectum.  He underwent a noncontrasted CT that was unremarkable.  He thinks he is due for follow-up colonoscopy.  Outpatient Medications Prior to Visit  Medication Sig Dispense Refill  . amLODipine (NORVASC) 10 MG tablet Take 1 tablet by mouth once daily 90 tablet 1  . aspirin 81 MG tablet Take 81 mg by mouth 2 (two) times daily.    . Continuous Blood Gluc Receiver (FREESTYLE LIBRE 14 DAY READER) DEVI     . Continuous Blood Gluc Sensor (FREESTYLE LIBRE 14 DAY SENSOR) MISC     . insulin glargine (LANTUS) 100 UNIT/ML injection INJECT 20 UNITS SUBCUTANEOUSLY ONCE DAILY AT BEDTIME 30 mL 0  . insulin NPH-regular Human (NOVOLIN 70/30) (70-30) 100 UNIT/ML injection Inject 20 Units into the skin 2 (two) times daily with a meal. 30 mL 11  . irbesartan (AVAPRO) 300 MG tablet Take 1 tablet (300 mg total) by mouth daily. 90 tablet 3  . levothyroxine (SYNTHROID) 100 MCG tablet Take 1 tablet by mouth once daily 90 tablet 1  . rosuvastatin (CRESTOR) 10 MG tablet Take 1 tablet by mouth once daily 90 tablet 2  . triamterene-hydrochlorothiazide (MAXZIDE-25) 37.5-25 MG tablet Take 1/2 (one-half) tablet by mouth once daily 45 tablet 2  .  aspirin 81 MG EC tablet     . Glucagon (GVOKE HYPOPEN 2-PACK) 1 MG/0.2ML SOAJ Inject 1 Act into the skin daily as needed. 2 mL 6   No facility-administered medications prior to visit.    ROS Review of Systems  Constitutional: Positive for unexpected weight change. Negative for appetite change, chills, diaphoresis, fatigue and fever.  Eyes: Negative.   Respiratory: Negative for cough, chest tightness and shortness of breath.   Cardiovascular: Negative.  Negative for chest pain, palpitations and leg swelling.  Gastrointestinal: Positive for abdominal pain. Negative for blood in stool, constipation, diarrhea, nausea and vomiting.  Endocrine: Negative.   Genitourinary: Negative for dysuria and hematuria.  Musculoskeletal: Negative.   Skin: Negative.   Neurological: Negative.  Negative for dizziness.  Hematological: Negative for adenopathy. Does not bruise/bleed easily.  Psychiatric/Behavioral: Negative.     Objective:  BP 138/70 (BP Location: Left Arm, Patient Position: Sitting, Cuff Size: Large)   Pulse 78   Temp 97.8 F (36.6 C) (Oral)   Ht 6\' 2"  (1.88 m)   Wt 168 lb (76.2 kg)   SpO2 96%   BMI 21.57 kg/m   BP Readings from Last 3 Encounters:  10/12/20 138/70  09/21/20 (!) 150/80  08/18/20 (!) 158/70    Wt Readings from Last 3 Encounters:  10/12/20 168 lb (76.2 kg)  09/21/20 176 lb (79.8 kg)  08/18/20 172 lb (78 kg)    Physical Exam Vitals reviewed.  Constitutional:  General: He is not in acute distress.    Appearance: He is not ill-appearing or toxic-appearing.  HENT:     Nose: Nose normal.     Mouth/Throat:     Mouth: Mucous membranes are moist.  Eyes:     General: No scleral icterus.    Conjunctiva/sclera: Conjunctivae normal.  Cardiovascular:     Rate and Rhythm: Normal rate and regular rhythm.     Pulses: Normal pulses.  Pulmonary:     Effort: Pulmonary effort is normal.     Breath sounds: No stridor. No wheezing or rhonchi.  Abdominal:      General: Abdomen is flat. Bowel sounds are normal.     Palpations: Abdomen is soft. There is no hepatomegaly, splenomegaly or mass.     Tenderness: There is no abdominal tenderness.  Musculoskeletal:        General: Normal range of motion.     Cervical back: Neck supple.     Right lower leg: No edema.     Left lower leg: No edema.  Lymphadenopathy:     Cervical: No cervical adenopathy.  Skin:    General: Skin is warm and dry.  Neurological:     General: No focal deficit present.     Mental Status: He is alert.  Psychiatric:        Mood and Affect: Mood normal.        Behavior: Behavior normal.     Lab Results  Component Value Date   WBC 8.3 10/12/2020   HGB 15.8 10/12/2020   HCT 46.5 10/12/2020   PLT 179.0 10/12/2020   GLUCOSE 108 (H) 10/12/2020   CHOL 94 06/17/2020   TRIG 54.0 06/17/2020   HDL 47.60 06/17/2020   LDLCALC 36 06/17/2020   ALT 33 10/12/2020   AST 33 10/12/2020   NA 140 10/12/2020   K 4.0 10/12/2020   CL 102 10/12/2020   CREATININE 1.24 10/12/2020   BUN 18 10/12/2020   CO2 30 10/12/2020   TSH 1.80 08/18/2020   PSA 2.44 10/03/2019   HGBA1C 7.8 (H) 10/12/2020   MICROALBUR 2.2 (H) 06/17/2020    CT ABDOMEN PELVIS WO CONTRAST  Result Date: 10/08/2020 CLINICAL DATA:  Left upper quadrant abdominal pain. Rule out splenic infarct. EXAM: CT ABDOMEN AND PELVIS WITHOUT CONTRAST TECHNIQUE: Multidetector CT imaging of the abdomen and pelvis was performed following the standard protocol without IV contrast. COMPARISON:  04/23/2009 FINDINGS: Lower chest: Coronary artery calcification is evident. Hepatobiliary: No focal abnormality in the liver on this study without intravenous contrast. There is no evidence for gallstones, gallbladder wall thickening, or pericholecystic fluid. No intrahepatic or extrahepatic biliary dilation. Pancreas: No focal mass lesion. No dilatation of the main duct. No intraparenchymal cyst. No peripancreatic edema. Spleen: No splenomegaly. No focal  mass lesion. Adrenals/Urinary Tract: No adrenal nodule or mass. Kidneys unremarkable. No evidence for hydroureter. The urinary bladder appears normal for the degree of distention. Stomach/Bowel: Stomach is unremarkable. No gastric wall thickening. No evidence of outlet obstruction. Duodenum is normally positioned as is the ligament of Treitz. No small bowel wall thickening. No small bowel dilatation. The terminal ileum is normal. The appendix is normal. No gross colonic mass. No colonic wall thickening. Large stool volume noted right and transverse colon. Left colon decompressed. Vascular/Lymphatic: There is abdominal aortic atherosclerosis without aneurysm. There is no gastrohepatic or hepatoduodenal ligament lymphadenopathy. No retroperitoneal or mesenteric lymphadenopathy. No pelvic sidewall lymphadenopathy. Reproductive: The prostate gland and seminal vesicles are unremarkable. Other: No intraperitoneal free  fluid. Musculoskeletal: No worrisome lytic or sclerotic osseous abnormality. IMPRESSION: 1. No acute findings in the abdomen or pelvis. Specifically, no findings to explain the patient's history of left upper quadrant pain. Evaluation for splenic infarct markedly limited due to lack of intravenous contrast material. 2. Aortic Atherosclerosis (ICD10-I70.0). Electronically Signed   By: Misty Stanley M.D.   On: 10/08/2020 09:52    Assessment & Plan:   Cristan was seen today for abdominal pain.  Diagnoses and all orders for this visit:  Pain, abdominal, epigastric- His recent CT scan, exam, and labs are reassuring.  I have asked him to see GI to see if he needs to undergo upper endoscopy to screen for pathology. -     Ambulatory referral to Gastroenterology -     CBC with Differential/Platelet; Future -     Basic metabolic panel; Future -     Lipase; Future -     Urinalysis, Routine w reflex microscopic; Future -     C-reactive protein; Future -     C-reactive protein -     Urinalysis, Routine w  reflex microscopic -     Lipase -     Basic metabolic panel -     CBC with Differential/Platelet  Essential hypertension- His BP is well controlled. -     CBC with Differential/Platelet; Future -     Basic metabolic panel; Future -     Hepatic function panel; Future -     Hepatic function panel -     Basic metabolic panel -     CBC with Differential/Platelet  Type 1 diabetes mellitus without complications ([place])- His blood sugar is adequately well controlled. -     Basic metabolic panel; Future -     Hemoglobin A1c; Future -     Hemoglobin A1c -     Basic metabolic panel   I have discontinued Lasean L. Gault's Gvoke HypoPen 2-Pack. I am also having him maintain his aspirin, insulin NPH-regular Human, levothyroxine, amLODipine, insulin glargine, rosuvastatin, triamterene-hydrochlorothiazide, FreeStyle Libre 14 Day Reader, FreeStyle Libre 14 Day Sensor, and irbesartan.  No orders of the defined types were placed in this encounter.    Follow-up: Return in about 3 months (around 01/12/2021).  Scarlette Calico, MD

## 2020-10-12 NOTE — Patient Instructions (Signed)
Abdominal Pain, Adult Pain in the abdomen (abdominal pain) can be caused by many things. Often, abdominal pain is not serious and it gets better with no treatment or by being treated at home. However, sometimes abdominal pain is serious. Your health care provider will ask questions about your medical history and do a physical exam to try to determine the cause of your abdominal pain. Follow these instructions at home: Medicines  Take over-the-counter and prescription medicines only as told by your health care provider.  Do not take a laxative unless told by your health care provider. General instructions  Watch your condition for any changes.  Drink enough fluid to keep your urine pale yellow.  Keep all follow-up visits as told by your health care provider. This is important.   Contact a health care provider if:  Your abdominal pain changes or gets worse.  You are not hungry or you lose weight without trying.  You are constipated or have diarrhea for more than 2-3 days.  You have pain when you urinate or have a bowel movement.  Your abdominal pain wakes you up at night.  Your pain gets worse with meals, after eating, or with certain foods.  You are vomiting and cannot keep anything down.  You have a fever.  You have blood in your urine. Get help right away if:  Your pain does not go away as soon as your health care provider told you to expect.  You cannot stop vomiting.  Your pain is only in areas of the abdomen, such as the right side or the left lower portion of the abdomen. Pain on the right side could be caused by appendicitis.  You have bloody or black stools, or stools that look like tar.  You have severe pain, cramping, or bloating in your abdomen.  You have signs of dehydration, such as: ? Dark urine, very little urine, or no urine. ? Cracked lips. ? Dry mouth. ? Sunken eyes. ? Sleepiness. ? Weakness.  You have trouble breathing or chest  pain. Summary  Often, abdominal pain is not serious and it gets better with no treatment or by being treated at home. However, sometimes abdominal pain is serious.  Watch your condition for any changes.  Take over-the-counter and prescription medicines only as told by your health care provider.  Contact a health care provider if your abdominal pain changes or gets worse.  Get help right away if you have severe pain, cramping, or bloating in your abdomen. This information is not intended to replace advice given to you by your health care provider. Make sure you discuss any questions you have with your health care provider. Document Revised: 07/12/2019 Document Reviewed: 10/01/2018 Elsevier Patient Education  [employer].

## 2020-10-20 NOTE — Telephone Encounter (Signed)
error 

## 2020-11-12 DIAGNOSIS — E1065 Type 1 diabetes mellitus with hyperglycemia: Secondary | ICD-10-CM | POA: Diagnosis not present

## 2020-11-12 DIAGNOSIS — E039 Hypothyroidism, unspecified: Secondary | ICD-10-CM | POA: Diagnosis not present

## 2020-11-12 DIAGNOSIS — E109 Type 1 diabetes mellitus without complications: Secondary | ICD-10-CM | POA: Diagnosis not present

## 2020-11-12 DIAGNOSIS — I2510 Atherosclerotic heart disease of native coronary artery without angina pectoris: Secondary | ICD-10-CM | POA: Diagnosis not present

## 2020-11-12 DIAGNOSIS — E1140 Type 2 diabetes mellitus with diabetic neuropathy, unspecified: Secondary | ICD-10-CM | POA: Diagnosis not present

## 2020-11-12 DIAGNOSIS — E11319 Type 2 diabetes mellitus with unspecified diabetic retinopathy without macular edema: Secondary | ICD-10-CM | POA: Diagnosis not present

## 2020-11-12 DIAGNOSIS — I10 Essential (primary) hypertension: Secondary | ICD-10-CM | POA: Diagnosis not present

## 2020-11-12 DIAGNOSIS — E7800 Pure hypercholesterolemia, unspecified: Secondary | ICD-10-CM | POA: Diagnosis not present

## 2020-11-19 ENCOUNTER — Encounter: Payer: Self-pay | Admitting: *Deleted

## 2020-11-30 ENCOUNTER — Other Ambulatory Visit: Payer: Self-pay

## 2020-11-30 ENCOUNTER — Encounter: Payer: Self-pay | Admitting: Internal Medicine

## 2020-11-30 ENCOUNTER — Ambulatory Visit (INDEPENDENT_AMBULATORY_CARE_PROVIDER_SITE_OTHER): Payer: Medicare Other | Admitting: Internal Medicine

## 2020-11-30 VITALS — BP 142/54 | HR 75 | Temp 98.0°F | Resp 16 | Ht 74.0 in | Wt 171.0 lb

## 2020-11-30 DIAGNOSIS — I10 Essential (primary) hypertension: Secondary | ICD-10-CM

## 2020-11-30 DIAGNOSIS — E109 Type 1 diabetes mellitus without complications: Secondary | ICD-10-CM

## 2020-11-30 DIAGNOSIS — K635 Polyp of colon: Secondary | ICD-10-CM

## 2020-11-30 DIAGNOSIS — R011 Cardiac murmur, unspecified: Secondary | ICD-10-CM

## 2020-11-30 DIAGNOSIS — N400 Enlarged prostate without lower urinary tract symptoms: Secondary | ICD-10-CM | POA: Diagnosis not present

## 2020-11-30 DIAGNOSIS — E1051 Type 1 diabetes mellitus with diabetic peripheral angiopathy without gangrene: Secondary | ICD-10-CM | POA: Diagnosis not present

## 2020-11-30 DIAGNOSIS — Z23 Encounter for immunization: Secondary | ICD-10-CM

## 2020-11-30 DIAGNOSIS — I70209 Unspecified atherosclerosis of native arteries of extremities, unspecified extremity: Secondary | ICD-10-CM | POA: Diagnosis not present

## 2020-11-30 DIAGNOSIS — E039 Hypothyroidism, unspecified: Secondary | ICD-10-CM

## 2020-11-30 LAB — PSA: PSA: 2.38 ng/mL (ref 0.10–4.00)

## 2020-11-30 MED ORDER — SHINGRIX 50 MCG/0.5ML IM SUSR: 0.5000 mL | Freq: Once | INTRAMUSCULAR | 1 refills | Status: AC

## 2020-11-30 MED ORDER — GVOKE HYPOPEN 2-PACK 1 MG/0.2ML ~~LOC~~ SOAJ: 1.0000 | Freq: Every day | SUBCUTANEOUS | 5 refills | Status: DC | PRN

## 2020-11-30 NOTE — Progress Notes (Signed)
Subjective:  Patient ID: Logan [name], male    DOB: Jun 18, 1950  Age: 70 y.o. MRN: [id number]  CC: Hypertension, Hypothyroidism, and Diabetes  This visit occurred during the SARS-CoV-2 public health emergency.  Safety protocols were in place, including screening questions prior to the visit, additional usage of staff PPE, and extensive cleaning of exam room while observing appropriate contact time as indicated for disinfecting solutions.    HPI SID GREENER presents for f/up -  He tells me he saw his endocrinologist about 2 weeks ago.  He says that labs were done and they were all okay.  I do not have any of those results.  He is active and denies any recent episodes of chest pain, shortness of breath, diaphoresis, dizziness, or lightheadedness.  Outpatient Medications Prior to Visit  Medication Sig Dispense Refill   amLODipine (NORVASC) 10 MG tablet Take 1 tablet by mouth once daily 90 tablet 1   aspirin 81 MG tablet Take 81 mg by mouth 2 (two) times daily.     Continuous Blood Gluc Receiver (FREESTYLE LIBRE 14 DAY READER) DEVI      Continuous Blood Gluc Sensor (FREESTYLE LIBRE 14 DAY SENSOR) MISC      insulin glargine (LANTUS) 100 UNIT/ML injection INJECT 20 UNITS SUBCUTANEOUSLY ONCE DAILY AT BEDTIME 30 mL 0   insulin NPH-regular Human (NOVOLIN 70/30) (70-30) 100 UNIT/ML injection Inject 20 Units into the skin 2 (two) times daily with a meal. 30 mL 11   irbesartan (AVAPRO) 300 MG tablet Take 1 tablet (300 mg total) by mouth daily. 90 tablet 3   levothyroxine (SYNTHROID) 100 MCG tablet Take 1 tablet by mouth once daily 90 tablet 1   rosuvastatin (CRESTOR) 10 MG tablet Take 1 tablet by mouth once daily 90 tablet 2   triamterene-hydrochlorothiazide (MAXZIDE-25) 37.5-25 MG tablet Take 1/2 (one-half) tablet by mouth once daily 45 tablet 2   No facility-administered medications prior to visit.    ROS Review of Systems  Constitutional:  Negative for diaphoresis and fatigue.  HENT:  Negative.    Respiratory: Negative.  Negative for cough, chest tightness, shortness of breath and wheezing.   Cardiovascular:  Negative for chest pain, palpitations and leg swelling.  Gastrointestinal:  Negative for abdominal pain, constipation, diarrhea, nausea and vomiting.  Endocrine: Negative.  Negative for cold intolerance and heat intolerance.  Genitourinary: Negative.  Negative for difficulty urinating.  Musculoskeletal: Negative.  Negative for arthralgias.  Skin: Negative.  Negative for color change.  Neurological: Negative.  Negative for dizziness, weakness and light-headedness.  Hematological:  Negative for adenopathy. Does not bruise/bleed easily.  Psychiatric/Behavioral: Negative.     Objective:  BP (!) 142/54 (BP Location: Right Arm, Patient Position: Sitting, Cuff Size: Large)   Pulse 75   Temp 98 F (36.7 C) (Oral)   Resp 16   Ht 6\' 2"  (1.88 m)   Wt 171 lb (77.6 kg)   SpO2 96%   BMI 21.96 kg/m   BP Readings from Last 3 Encounters:  11/30/20 (!) 142/54  10/12/20 138/70  09/21/20 (!) 150/80    Wt Readings from Last 3 Encounters:  11/30/20 171 lb (77.6 kg)  10/12/20 168 lb (76.2 kg)  09/21/20 176 lb (79.8 kg)    Physical Exam Vitals reviewed.  HENT:     Nose: Nose normal.     Mouth/Throat:     Mouth: Mucous membranes are moist.  Eyes:     General: No scleral icterus.    Conjunctiva/sclera: Conjunctivae normal.  Cardiovascular:     Rate and Rhythm: Normal rate and regular rhythm.     Heart sounds: Murmur heard.  Systolic murmur is present with a grade of 1/6.  No diastolic murmur is present.    No gallop.  Pulmonary:     Effort: Pulmonary effort is normal.     Breath sounds: No stridor. No wheezing, rhonchi or rales.  Abdominal:     General: Abdomen is flat.     Palpations: There is no mass.     Tenderness: There is no abdominal tenderness. There is no guarding.  Musculoskeletal:        General: Normal range of motion.     Cervical back: Neck  supple.     Right lower leg: No edema.     Left lower leg: No edema.  Lymphadenopathy:     Cervical: No cervical adenopathy.  Skin:    General: Skin is warm and dry.  Neurological:     General: No focal deficit present.     Mental Status: He is alert.    Lab Results  Component Value Date   WBC 8.3 10/12/2020   HGB 15.8 10/12/2020   HCT 46.5 10/12/2020   PLT 179.0 10/12/2020   GLUCOSE 108 (H) 10/12/2020   CHOL 94 06/17/2020   TRIG 54.0 06/17/2020   HDL 47.60 06/17/2020   LDLCALC 36 06/17/2020   ALT 33 10/12/2020   AST 33 10/12/2020   NA 140 10/12/2020   K 4.0 10/12/2020   CL 102 10/12/2020   CREATININE 1.24 10/12/2020   BUN 18 10/12/2020   CO2 30 10/12/2020   TSH 1.80 08/18/2020   PSA 2.38 11/30/2020   HGBA1C 7.8 (H) 10/12/2020   MICROALBUR 2.2 (H) 06/17/2020    CT ABDOMEN PELVIS WO CONTRAST  Result Date: 10/08/2020 CLINICAL DATA:  Left upper quadrant abdominal pain. Rule out splenic infarct. EXAM: CT ABDOMEN AND PELVIS WITHOUT CONTRAST TECHNIQUE: Multidetector CT imaging of the abdomen and pelvis was performed following the standard protocol without IV contrast. COMPARISON:  04/23/2009 FINDINGS: Lower chest: Coronary artery calcification is evident. Hepatobiliary: No focal abnormality in the liver on this study without intravenous contrast. There is no evidence for gallstones, gallbladder wall thickening, or pericholecystic fluid. No intrahepatic or extrahepatic biliary dilation. Pancreas: No focal mass lesion. No dilatation of the main duct. No intraparenchymal cyst. No peripancreatic edema. Spleen: No splenomegaly. No focal mass lesion. Adrenals/Urinary Tract: No adrenal nodule or mass. Kidneys unremarkable. No evidence for hydroureter. The urinary bladder appears normal for the degree of distention. Stomach/Bowel: Stomach is unremarkable. No gastric wall thickening. No evidence of outlet obstruction. Duodenum is normally positioned as is the ligament of Treitz. No small bowel  wall thickening. No small bowel dilatation. The terminal ileum is normal. The appendix is normal. No gross colonic mass. No colonic wall thickening. Large stool volume noted right and transverse colon. Left colon decompressed. Vascular/Lymphatic: There is abdominal aortic atherosclerosis without aneurysm. There is no gastrohepatic or hepatoduodenal ligament lymphadenopathy. No retroperitoneal or mesenteric lymphadenopathy. No pelvic sidewall lymphadenopathy. Reproductive: The prostate gland and seminal vesicles are unremarkable. Other: No intraperitoneal free fluid. Musculoskeletal: No worrisome lytic or sclerotic osseous abnormality. IMPRESSION: 1. No acute findings in the abdomen or pelvis. Specifically, no findings to explain the patient's history of left upper quadrant pain. Evaluation for splenic infarct markedly limited due to lack of intravenous contrast material. 2. Aortic Atherosclerosis (ICD10-I70.0). Electronically Signed   By: Misty Stanley M.D.   On: 10/08/2020 09:52  Assessment & Plan:   William was seen today for hypertension, hypothyroidism and diabetes.  Diagnoses and all orders for this visit:  Polyp of colon, unspecified part of colon, unspecified type -     Ambulatory referral to Gastroenterology  Need for shingles vaccine -     Zoster Vaccine Adjuvanted [facility]) injection; Inject 0.5 mLs into the muscle once for 1 dose.  Systolic murmur- I have asked him to undergo an echocardiogram to see if he has valvular heart disease. -     ECHOCARDIOGRAM COMPLETE; Future  Essential hypertension- His blood pressure is adequately well controlled.  Acquired hypothyroidism  Benign prostatic hyperplasia without lower urinary tract symptoms- His PSA is normal.  This is a reassuring sign that he does not have prostate cancer. -     PSA; Future -     PSA  Type 1 diabetes mellitus without complications (HCC) -     Glucagon (GVOKE HYPOPEN 2-PACK) 1 MG/0.2ML SOAJ; Inject 1 Act into the  skin daily as needed.  I am having Antavion L. Stirewalt start on Shingrix and Gvoke HypoPen 2-Pack. I am also having him maintain his aspirin, insulin NPH-regular Human, levothyroxine, amLODipine, insulin glargine, rosuvastatin, triamterene-hydrochlorothiazide, FreeStyle Libre 14 Day Reader, FreeStyle Libre 14 Day Sensor, and irbesartan.  Meds ordered this encounter  Medications   Zoster Vaccine Adjuvanted [facility]) injection    Sig: Inject 0.5 mLs into the muscle once for 1 dose.    Dispense:  0.5 mL    Refill:  1   Glucagon (GVOKE HYPOPEN 2-PACK) 1 MG/0.2ML SOAJ    Sig: Inject 1 Act into the skin daily as needed.    Dispense:  2 mL    Refill:  5      Follow-up: Return in about 4 months (around 04/01/2021).  Scarlette Calico, MD

## 2020-11-30 NOTE — Patient Instructions (Signed)

## 2020-12-03 ENCOUNTER — Ambulatory Visit (HOSPITAL_COMMUNITY): Payer: Medicare Other | Attending: Internal Medicine

## 2020-12-03 ENCOUNTER — Other Ambulatory Visit: Payer: Self-pay

## 2020-12-03 DIAGNOSIS — R011 Cardiac murmur, unspecified: Secondary | ICD-10-CM | POA: Insufficient documentation

## 2020-12-03 LAB — ECHOCARDIOGRAM COMPLETE
AV Mean grad: 4 mmHg
Area-P 1/2: 3.15 cm2
S' Lateral: 3.2 cm

## 2020-12-04 ENCOUNTER — Telehealth: Payer: Self-pay | Admitting: Internal Medicine

## 2020-12-04 NOTE — Chronic Care Management (AMB) (Signed)
  Chronic Care Management   Outreach Note  12/04/2020 Name: Logan [name] MRN: [id number] DOB: May 15, 1951  Referred by: Janith Lima, MD Reason for referral : No chief complaint on file.   An unsuccessful telephone outreach was attempted today. The patient was referred to the pharmacist for assistance with care management and care coordination.   Follow Up Plan:   Lauretta Grill Upstream Scheduler

## 2020-12-09 ENCOUNTER — Other Ambulatory Visit (INDEPENDENT_AMBULATORY_CARE_PROVIDER_SITE_OTHER): Payer: Medicare Other

## 2020-12-09 ENCOUNTER — Other Ambulatory Visit: Payer: Self-pay

## 2020-12-09 ENCOUNTER — Encounter: Payer: Self-pay | Admitting: Internal Medicine

## 2020-12-09 ENCOUNTER — Ambulatory Visit (INDEPENDENT_AMBULATORY_CARE_PROVIDER_SITE_OTHER): Payer: Medicare Other | Admitting: Internal Medicine

## 2020-12-09 VITALS — BP 144/60 | HR 74 | Ht 74.0 in | Wt 172.0 lb

## 2020-12-09 DIAGNOSIS — R7989 Other specified abnormal findings of blood chemistry: Secondary | ICD-10-CM

## 2020-12-09 DIAGNOSIS — I70209 Unspecified atherosclerosis of native arteries of extremities, unspecified extremity: Secondary | ICD-10-CM

## 2020-12-09 DIAGNOSIS — Z86010 Personal history of colonic polyps: Secondary | ICD-10-CM | POA: Diagnosis not present

## 2020-12-09 DIAGNOSIS — R1013 Epigastric pain: Secondary | ICD-10-CM | POA: Diagnosis not present

## 2020-12-09 DIAGNOSIS — E1051 Type 1 diabetes mellitus with diabetic peripheral angiopathy without gangrene: Secondary | ICD-10-CM | POA: Diagnosis not present

## 2020-12-09 LAB — HEPATIC FUNCTION PANEL
ALT: 26 U/L (ref 0–53)
AST: 22 U/L (ref 0–37)
Albumin: 4.5 g/dL (ref 3.5–5.2)
Alkaline Phosphatase: 164 U/L — ABNORMAL HIGH (ref 39–117)
Bilirubin, Direct: 0.2 mg/dL (ref 0.0–0.3)
Total Bilirubin: 1.1 mg/dL (ref 0.2–1.2)
Total Protein: 7.6 g/dL (ref 6.0–8.3)

## 2020-12-09 LAB — IBC + FERRITIN
Ferritin: 130.1 ng/mL (ref 22.0–322.0)
Iron: 207 ug/dL — ABNORMAL HIGH (ref 42–165)
Saturation Ratios: 80.8 % — ABNORMAL HIGH (ref 20.0–50.0)
Transferrin: 183 mg/dL — ABNORMAL LOW (ref 212.0–360.0)

## 2020-12-09 LAB — GAMMA GT: GGT: 8 U/L (ref 7–51)

## 2020-12-09 MED ORDER — PLENVU 140 G PO SOLR: 1.0000 | ORAL | 0 refills | Status: DC

## 2020-12-09 NOTE — Patient Instructions (Signed)
You have been scheduled for an endoscopy and colonoscopy. Please follow the written instructions given to you at your visit today. Please pick up your prep supplies at the pharmacy within the next 1-3 days. If you use inhalers (even only as needed), please bring them with you on the day of your procedure.  Your provider has requested that you go to the basement level for lab work before leaving today. Press "B" on the elevator. The lab is located at the first door on the left as you exit the elevator.  If you are age 68 or older, your body mass index should be between 23-30. Your Body mass index is 22.08 kg/m. If this is out of the aforementioned range listed, please consider follow up with your Primary Care Provider.  The [place] [facility] providers would like to encourage you to use [facility] to communicate with providers for non-urgent requests or questions.  Due to long hold times on the telephone, sending your provider a message by [facility] may be a faster and more efficient way to get a response.  Please allow 48 business hours for a response.  Please remember that this is for non-urgent requests.   Due to recent changes in healthcare laws, you may see the results of your imaging and laboratory studies on MyChart before your provider has had a chance to review them.  We understand that in some cases there may be results that are confusing or concerning to you. Not all laboratory results come back in the same time frame and the provider may be waiting for multiple results in order to interpret others.  Please give [place] 48 hours in order for your provider to thoroughly review all the results before contacting the office for clarification of your results.

## 2020-12-09 NOTE — Progress Notes (Deleted)
epig

## 2020-12-09 NOTE — Progress Notes (Signed)
Patient ID: Logan [name], male   DOB: January 01, 1951, 70 y.o.   MRN: [id number] HPI: Logan [name] is a 71 year old male with a history of adenomatous colon polyps, history of hemochromatosis with remote phlebotomy, diabetes, CAD, hypertension, hyperlipidemia who is seen in consult at the request of Dr. Ronnald Ramp to evaluate epigastric abdominal pain.  He is here alone today.  He is known to me from his previous screening colonoscopy which I performed in May 2016.  This colonoscopy revealed 4 polyps ranging in size from 3 to 8 mm.  These were removed and found to be tubular adenomas.  3-year recall colonoscopy was recommended at that time.  He reports that he was in his usual state of health until 08/20/2020 when he developed pain in the epigastrium located in the center and just left of center of the upper abdomen.  He reports this happened after eating a hot dog given to him from a neighbor.  He reports that the pain was present for 2 months and the last he remembers experiencing it was 10/14/2020.  Again he describes this as a "soreness" that did not really worsen with food.  Seem to be worse at night and more noticeable after lying down.  He had some mild nausea with this but not vomiting.  The pain did not radiate.  He denies a weight loss associated with this pain.  It has completely resolved at this point.  He reports that his bowel habits are regular without blood or melena.  He denies dysphagia, odynophagia and frequent heartburn.  He does have a history of hemochromatosis and I can see in our records that he had several episodes of phlebotomy around 2011.  He reports that this was diagnosed because of his brothers history of hereditary hemochromatosis as well as he developed shoulder and joint pains.  He has not had phlebotomy in over a decade.  He does not have new joint pain.  He denies itching, abdominal and lower extremity swelling, easy bruising and bleeding.  He reports he is aware of his being  overdue for colonoscopy however he was frustrated over our rules for a care partner who must remain on site during his procedure.  He reports that this really frustrates him and he does not completely understand the policy because he would never drive himself home against our instructions following sedation.  Past Medical History:  Diagnosis Date   Aortic atherosclerosis ([place])    Cataract    Coronary artery disease involving native coronary artery of native heart without angina pectoris 11/01/2007   Last Cath '04: LAD w/ 60% long lesion; 1st Dx 70% ostial; 2nd Dx  70% ostial; Cx 30% ostial; RCA-dominant multiple 30-40% lesions; PDA 30% lesions  Last nuclear stress March '09 - negative for ischemia    Diabetes mellitus    Diabetic retinopathy    Hearing loss    Hemochromatosis    Hyperlipidemia    Hypertension    Tubular adenoma of colon     Past Surgical History:  Procedure Laterality Date   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     Continuous Blood Gluc Receiver (FREESTYLE LIBRE 14 DAY READER) DEVI      Continuous Blood Gluc Sensor (FREESTYLE LIBRE 14 DAY SENSOR) MISC      Glucagon (GVOKE HYPOPEN 2-PACK) 1 MG/0.2ML SOAJ Inject 1 Act into the skin daily as needed. 2 mL 5   insulin glargine (LANTUS) 100 UNIT/ML injection INJECT 20 UNITS SUBCUTANEOUSLY ONCE DAILY AT BEDTIME 30 mL 0   insulin NPH-regular Human (NOVOLIN 70/30) (70-30) 100 UNIT/ML injection Inject 20 Units into the skin 2 (two) times daily with a meal. 30 mL 11   irbesartan (AVAPRO) 300 MG tablet Take 1 tablet (300 mg total) by  mouth daily. 90 tablet 3   levothyroxine (SYNTHROID) 100 MCG tablet Take 1 tablet by mouth once daily 90 tablet 1   rosuvastatin (CRESTOR) 10 MG tablet Take 1 tablet by mouth once daily 90 tablet 2   triamterene-hydrochlorothiazide (MAXZIDE-25) 37.5-25 MG tablet Take 1/2 (one-half) tablet by mouth once daily 45 tablet 2   No facility-administered medications prior to visit.    No Known Allergies  Family History  Problem Relation Age of Onset   Asthma Mother    Heart failure Mother    Emphysema Mother    Alcohol abuse Father    Cancer Father        prostate   Stroke Father    Colon cancer Maternal Uncle        dx'd 63   Heart disease Neg Hx    Rectal cancer Neg Hx    Stomach cancer Neg Hx     Social History   Tobacco Use   Smoking status: Never   Smokeless tobacco: Never  Vaping Use   Vaping Use: Never used  Substance Use Topics   Alcohol use: Yes    Comment: occ beer but very rare   Drug use: No    ROS: As per history of present illness, otherwise negative  BP (!) 144/60   Pulse 74   Ht 6\' 2"  (1.88 m)   Wt 172 lb (78 kg)   SpO2 99%   BMI 22.08 kg/m  Constitutional: Well-developed and well-nourished. No distress. HEENT: Normocephalic and atraumatic. No scleral icterus. Cardiovascular: Normal rate, regular rhythm and intact distal pulses. No M/R/G Pulmonary/chest: Effort normal and breath sounds normal. No wheezing, rales or rhonchi. Abdominal: Soft, nontender, nondistended. Bowel sounds active throughout. There are no masses palpable. No hepatosplenomegaly. Extremities: no clubbing, cyanosis, or edema Neurological: Alert and oriented to person place and time. Skin: Skin is warm and dry.  Psychiatric: Normal mood and affect. Behavior is normal.  RELEVANT LABS AND IMAGING: CBC    Component Value Date/Time   WBC 8.3 10/12/2020 1420   RBC 4.97 10/12/2020 1420   HGB 15.8 10/12/2020 1420   HCT 46.5 10/12/2020 1420   PLT 179.0 10/12/2020 1420   MCV 93.6  10/12/2020 1420   MCH 31.9 03/11/2013 1957   MCHC 34.0 10/12/2020 1420   RDW 14.2 10/12/2020 1420   LYMPHSABS 1.9 10/12/2020 1420   MONOABS 0.8 10/12/2020 1420   EOSABS 0.5 10/12/2020 1420   BASOSABS 0.1 10/12/2020 1420    CMP     Component Value Date/Time   NA 140 10/12/2020 1420   NA 141 08/31/2016 1648   K 4.0 10/12/2020 1420   CL 102 10/12/2020 1420   CO2 30 10/12/2020 1420   GLUCOSE 108 (H) 10/12/2020 1420   BUN 18 10/12/2020 1420  BUN 18 08/31/2016 1648   CREATININE 1.24 10/12/2020 1420   CALCIUM 9.6 10/12/2020 1420   PROT 7.2 10/12/2020 1420   ALBUMIN 4.3 10/12/2020 1420   AST 33 10/12/2020 1420   ALT 33 10/12/2020 1420   ALKPHOS 144 (H) 10/12/2020 1420   BILITOT 0.8 10/12/2020 1420   GFRNONAA 62 08/31/2016 1648   GFRAA 71 08/31/2016 1648   CT ABDOMEN AND PELVIS WITHOUT CONTRAST   TECHNIQUE: Multidetector CT imaging of the abdomen and pelvis was performed following the standard protocol without IV contrast.   COMPARISON:  04/23/2009   FINDINGS: Lower chest: Coronary artery calcification is evident.   Hepatobiliary: No focal abnormality in the liver on this study without intravenous contrast. There is no evidence for gallstones, gallbladder wall thickening, or pericholecystic fluid. No intrahepatic or extrahepatic biliary dilation.   Pancreas: No focal mass lesion. No dilatation of the main duct. No intraparenchymal cyst. No peripancreatic edema.   Spleen: No splenomegaly. No focal mass lesion.   Adrenals/Urinary Tract: No adrenal nodule or mass. Kidneys unremarkable. No evidence for hydroureter. The urinary bladder appears normal for the degree of distention.   Stomach/Bowel: Stomach is unremarkable. No gastric wall thickening. No evidence of outlet obstruction. Duodenum is normally positioned as is the ligament of Treitz. No small bowel wall thickening. No small bowel dilatation. The terminal ileum is normal. The appendix is normal. No gross  colonic mass. No colonic wall thickening. Large stool volume noted right and transverse colon. Left colon decompressed.   Vascular/Lymphatic: There is abdominal aortic atherosclerosis without aneurysm. There is no gastrohepatic or hepatoduodenal ligament lymphadenopathy. No retroperitoneal or mesenteric lymphadenopathy. No pelvic sidewall lymphadenopathy.   Reproductive: The prostate gland and seminal vesicles are unremarkable.   Other: No intraperitoneal free fluid.   Musculoskeletal: No worrisome lytic or sclerotic osseous abnormality.   IMPRESSION: 1. No acute findings in the abdomen or pelvis. Specifically, no findings to explain the patient's history of left upper quadrant pain. Evaluation for splenic infarct markedly limited due to lack of intravenous contrast material. 2. Aortic Atherosclerosis (ICD10-I70.0).     Electronically Signed   By: Misty Stanley M.D.   On: 10/08/2020 09:52  ASSESSMENT/PLAN: 70 year old male with a history of adenomatous colon polyps, history of hemochromatosis with remote phlebotomy, diabetes, CAD, hypertension, hyperlipidemia who is seen in consult at the request of Dr. Ronnald Ramp to evaluate epigastric abdominal pain.   Epigastric abdominal pain --now completely resolved for nearly 2 months.  Unclear etiology but he associates this with possibly a food poisoning type situation.  Differential includes gastritis or even peptic ulcer disease.  He had a reassuring CT scan though this was without contrast during the contrast shortage, of the abdomen pelvis.  He also had lab work which was reassuring including a normal lipase. --No medical therapy recommended at this time --We did discuss upper endoscopy including the risk, benefits and alternatives.  We will plan upper endoscopy when he presents for surveillance colonoscopy.  2.  Personal history of adenomatous colon polyps --4 adenomatous colon polyps removed at colonoscopy in 2016.  3-year surveillance  interval was recommended but he canceled over frustration with our transportation policy.  I explained to him the rationale for this policy which he accepts.  I have strongly recommended surveillance colonoscopy and after discussion of the risk, benefits and alternatives he is agreeable and wishes to proceed.  He will work on transportation. --Colonoscopy in the [place]  3.  Hereditary hemochromatosis/elevated alkaline phosphatase -- I do not see the  hereditary hemochromatosis genetics evaluation though with family history and personal history of phlebotomy I would expect this to be positive.  There is no signs of advanced liver disease.  He had a ferritin earlier this year which was over 100 but not grossly elevated.  I am going to repeat liver enzymes today --Hepatic function panel, GGT, alkaline phosphatase isoenzymes, IBC panel plus ferritin and hereditary hemochromatosis genetics      IO:EVOJJ, Arvid Right, [place] [address] [place],  [place] [id number]

## 2020-12-11 LAB — ALKALINE PHOSPHATASE, ISOENZYMES
Alkaline Phosphatase: 195 IU/L — ABNORMAL HIGH (ref 44–121)
BONE FRACTION: 28 % (ref 12–68)
INTESTINAL FRAC.: 0 % (ref 0–18)
LIVER FRACTION: 72 % (ref 13–88)

## 2020-12-17 LAB — HEMOCHROMATOSIS DNA-PCR(C282Y,H63D)

## 2020-12-22 ENCOUNTER — Telehealth: Payer: Self-pay | Admitting: Internal Medicine

## 2020-12-22 NOTE — Chronic Care Management (AMB) (Signed)
  Care Management   Follow Up Note   12/22/2020 Name: Logan [name] MRN: [id number] DOB: 05-Nov-1950   Referred by: Janith Lima, MD Reason for referral : No chief complaint on file.   An unsuccessful telephone outreach was attempted today. The patient was referred to the case management team for assistance with care management and care coordination.   Follow Up Plan: The care management team will reach out to the patient again over the next 7 days.   Noelle Penner Upstream Scheduler

## 2020-12-27 ENCOUNTER — Encounter: Payer: Self-pay | Admitting: Internal Medicine

## 2020-12-27 ENCOUNTER — Other Ambulatory Visit: Payer: Self-pay | Admitting: Internal Medicine

## 2020-12-27 DIAGNOSIS — E039 Hypothyroidism, unspecified: Secondary | ICD-10-CM

## 2020-12-28 ENCOUNTER — Other Ambulatory Visit: Payer: Self-pay

## 2020-12-29 ENCOUNTER — Telehealth: Payer: Self-pay | Admitting: Internal Medicine

## 2020-12-29 NOTE — Chronic Care Management (AMB) (Signed)
  Chronic Care Management   Note  12/29/2020 Name: Logan [name] MRN: [id number] DOB: 08-May-1951  Logan [name] is a 70 y.o. year old male who is a primary care patient of Janith Lima, MD. I reached out to Verdie Drown by phone today in response to a referral sent by Logan [name] PCP, Janith Lima, MD.   Logan [name] was given information about Chronic Care Management services today including:  CCM service includes personalized support from designated clinical staff supervised by his physician, including individualized plan of care and coordination with other care providers 24/7 contact phone numbers for assistance for urgent and routine care needs. Service will only be billed when office clinical staff spend 20 minutes or more in a month to coordinate care. Only one practitioner may furnish and bill the service in a calendar month. The patient may stop CCM services at any time (effective at the end of the month) by phone call to the office staff.   Patient agreed to services and verbal consent obtained.   Follow up plan:   Logan [name] Upstream Scheduler

## 2021-01-01 ENCOUNTER — Telehealth: Payer: Self-pay | Admitting: Hematology and Oncology

## 2021-01-01 NOTE — Telephone Encounter (Signed)
Received a new hem referral from Dr. Hilarie Fredrickson for hemochromatosis. Logan [name] has been cld and scheduled to see Logan [name] on 8/10 at 1:40pm.

## 2021-01-13 ENCOUNTER — Inpatient Hospital Stay: Payer: Medicare Other

## 2021-01-13 ENCOUNTER — Encounter: Payer: Self-pay | Admitting: Hematology and Oncology

## 2021-01-13 ENCOUNTER — Other Ambulatory Visit: Payer: Self-pay

## 2021-01-13 ENCOUNTER — Inpatient Hospital Stay: Payer: Medicare Other | Attending: Hematology and Oncology | Admitting: Hematology and Oncology

## 2021-01-13 DIAGNOSIS — Z8042 Family history of malignant neoplasm of prostate: Secondary | ICD-10-CM | POA: Insufficient documentation

## 2021-01-13 DIAGNOSIS — E785 Hyperlipidemia, unspecified: Secondary | ICD-10-CM | POA: Insufficient documentation

## 2021-01-13 DIAGNOSIS — E11319 Type 2 diabetes mellitus with unspecified diabetic retinopathy without macular edema: Secondary | ICD-10-CM | POA: Insufficient documentation

## 2021-01-13 DIAGNOSIS — E119 Type 2 diabetes mellitus without complications: Secondary | ICD-10-CM | POA: Insufficient documentation

## 2021-01-13 DIAGNOSIS — Z800 Family history of malignant neoplasm of digestive organs: Secondary | ICD-10-CM | POA: Diagnosis not present

## 2021-01-13 DIAGNOSIS — I10 Essential (primary) hypertension: Secondary | ICD-10-CM | POA: Insufficient documentation

## 2021-01-13 DIAGNOSIS — Z79899 Other long term (current) drug therapy: Secondary | ICD-10-CM | POA: Diagnosis not present

## 2021-01-13 DIAGNOSIS — I700 Atherosclerosis of aorta: Secondary | ICD-10-CM | POA: Insufficient documentation

## 2021-01-13 DIAGNOSIS — I2510 Atherosclerotic heart disease of native coronary artery without angina pectoris: Secondary | ICD-10-CM | POA: Insufficient documentation

## 2021-01-13 LAB — CBC WITH DIFFERENTIAL (CANCER CENTER ONLY)
Abs Immature Granulocytes: 0.02 10*3/uL (ref 0.00–0.07)
Basophils Absolute: 0.1 10*3/uL (ref 0.0–0.1)
Basophils Relative: 1 %
Eosinophils Absolute: 0.6 10*3/uL — ABNORMAL HIGH (ref 0.0–0.5)
Eosinophils Relative: 8 %
HCT: 44.8 % (ref 39.0–52.0)
Hemoglobin: 15.4 g/dL (ref 13.0–17.0)
Immature Granulocytes: 0 %
Lymphocytes Relative: 27 %
Lymphs Abs: 2 10*3/uL (ref 0.7–4.0)
MCH: 32 pg (ref 26.0–34.0)
MCHC: 34.4 g/dL (ref 30.0–36.0)
MCV: 93.1 fL (ref 80.0–100.0)
Monocytes Absolute: 0.8 10*3/uL (ref 0.1–1.0)
Monocytes Relative: 11 %
Neutro Abs: 4.1 10*3/uL (ref 1.7–7.7)
Neutrophils Relative %: 53 %
Platelet Count: 210 10*3/uL (ref 150–400)
RBC: 4.81 MIL/uL (ref 4.22–5.81)
RDW: 13.3 % (ref 11.5–15.5)
WBC Count: 7.6 10*3/uL (ref 4.0–10.5)
nRBC: 0 % (ref 0.0–0.2)

## 2021-01-13 LAB — CMP (CANCER CENTER ONLY)
ALT: 32 U/L (ref 0–44)
AST: 28 U/L (ref 15–41)
Albumin: 4.2 g/dL (ref 3.5–5.0)
Alkaline Phosphatase: 165 U/L — ABNORMAL HIGH (ref 38–126)
Anion gap: 8 (ref 5–15)
BUN: 18 mg/dL (ref 8–23)
CO2: 29 mmol/L (ref 22–32)
Calcium: 9.5 mg/dL (ref 8.9–10.3)
Chloride: 103 mmol/L (ref 98–111)
Creatinine: 1.29 mg/dL — ABNORMAL HIGH (ref 0.61–1.24)
GFR, Estimated: >60 mL/min (ref >60)
Glucose, Bld: 72 mg/dL (ref 70–99)
Potassium: 4.2 mmol/L (ref 3.5–5.1)
Sodium: 140 mmol/L (ref 135–145)
Total Bilirubin: 1 mg/dL (ref 0.3–1.2)
Total Protein: 7.4 g/dL (ref 6.5–8.1)

## 2021-01-13 LAB — IRON AND TIBC
Iron: 176 ug/dL — ABNORMAL HIGH (ref 42–163)
Saturation Ratios: 81 % — ABNORMAL HIGH (ref 20–55)
TIBC: 219 ug/dL (ref 202–409)
UIBC: 42 ug/dL — ABNORMAL LOW (ref 117–376)

## 2021-01-13 LAB — FERRITIN: Ferritin: 109 ng/mL (ref 24–336)

## 2021-01-13 NOTE — Progress Notes (Signed)
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 Genetic testing shows patient is homozygous for C282Y 01/13/2021: establish care with Dr. Lorenso Courier   CHIEF COMPLAINTS/PURPOSE OF CONSULTATION:  "Hereditary Hemachromatosis "  HISTORY OF PRESENTING ILLNESS:  Logan [name] 70 y.o. male with medical history significant for CAD, DM type II, HLD, HTN, and diabetic retinopathy who presents for evaluation of hereditary hemachromatosis.   On review of the previous records Mr. Lacko previously been treated by GI for his hereditary hemochromatosis.  His ferritin on record has never been all that high with a ferritin of 127.4 on 01/21/2019 and most recently the highest it has been on record with ferritin of 130 on 12/09/2020.  Due to concern for this history of hemochromatosis with confirmed genetic testing the patient was referred to hematology for further evaluation and management.  On exam today Mr. Terral reports that he carries this diagnosis and his brother carries it as well.  He notes that his brother was diagnosed in the year 2018 although siblings were checked and he was also found to have it.  He underwent phlebotomy over a 26-monthperiod of time but subsequently has not required bloodletting.  He notes that he does his part to decrease his iron intake by decreasing consumption of red meat he does enjoy pork and "a hamburger every now and again".  He notes he has had numerous health challenges including type 2 diabetes aching in his knees and hands and neuropathy in his feet.  He  notes he has not had any major changes in his health over the last several months.  He notes he is a never smoker.  A full 10 point ROS is listed below.  MEDICAL HISTORY:  Past Medical History:  Diagnosis Date   Aortic atherosclerosis ([facility]    Cataract    Coronary artery disease involving native coronary artery of native heart without angina pectoris 11/01/2007   Last Cath '04: LAD w/ 60% long lesion; 1st Dx 70% ostial; 2nd Dx  70% ostial; Cx 30% ostial; RCA-dominant multiple 30-40% lesions; PDA 30% lesions  Last nuclear stress March '09 - negative for ischemia    Diabetes mellitus    Diabetic retinopathy    Hearing loss    Hemochromatosis    Hyperlipidemia    Hypertension    Tubular adenoma of colon     SURGICAL HISTORY: Past Surgical History:  Procedure Laterality Date   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   Procedure: Right Great Toe Amputation;  Surgeon: MNewt Minion MD;  Location: [facility];  Service: Orthopedics;  Laterality: Right;  Right Great Toe Amputation   INNER EAR SURGERY     right    SOCIAL HISTORY: Social History   Socioeconomic History   Marital status: Widowed    Spouse name: Not on file   Number of children: 1   Years of education: 142  Highest education level: Not on file  Occupational History   Occupation: retired    [employer] farm [employer] [place] Tobacco Use   Smoking status: Never   Smokeless tobacco: Never  Vaping Use   Vaping Use: Never used  Substance and  Sexual Activity   Alcohol use: Yes    Comment: occ beer but very rare   Drug use: No   Sexual activity: Yes    Partners: Female  Other Topics Concern   Not on file  Social History Narrative   HSG. Married - '77 - '04. 1 son '80. Work - semi-retired. Has a girlfriend.   Social Determinants of Health   Financial Resource Strain: Low Risk    Difficulty of Paying Living Expenses: Not hard at all  Food Insecurity: No Food Insecurity   Worried About Charity fundraiser in the Last Year: Never true    Adren in the Last Year: Never true  Transportation Needs: No Transportation Needs   Lack of Transportation (Medical): No   Lack of Transportation (Non-Medical): No  Physical Activity: Sufficiently Active   Days of Exercise per Week: 3 days   Minutes of Exercise per Session: 60 min  Stress: Not on file  Social Connections: Not on file  Intimate Partner Violence: Not At Risk   Fear of Current or Ex-Partner: No   Emotionally Abused: No   Physically Abused: No   Sexually Abused: No    FAMILY HISTORY: Family History  Problem Relation Age of Onset   Asthma Mother    Heart failure Mother    Emphysema Mother    Alcohol abuse Father    Cancer Father        prostate   Stroke Father    Colon cancer Maternal Uncle        dx'd 66   Heart disease Neg Hx    Rectal cancer Neg Hx    Stomach cancer Neg Hx     ALLERGIES:  has No Known Allergies.  MEDICATIONS:  Current Outpatient Medications  Medication Sig Dispense Refill   amLODipine (NORVASC) 10 MG tablet Take 1 tablet by mouth once daily 90 tablet 1   aspirin 81 MG tablet Take 81 mg by mouth 2 (two) times daily.     Continuous Blood Gluc Receiver (FREESTYLE LIBRE 14 DAY READER) DEVI      Continuous Blood Gluc Sensor (FREESTYLE LIBRE 14 DAY SENSOR) MISC      insulin glargine (LANTUS) 100 UNIT/ML injection INJECT 20 UNITS SUBCUTANEOUSLY ONCE DAILY AT BEDTIME 30 mL 0   insulin NPH-regular Human (NOVOLIN 70/30) (70-30) 100 UNIT/ML injection Inject 20 Units into the skin 2 (two) times daily with a meal. 30 mL 11   irbesartan (AVAPRO) 300 MG tablet Take 1 tablet (300 mg total) by mouth daily. 90 tablet 3   levothyroxine (SYNTHROID) 100 MCG tablet Take 1 tablet by mouth once daily 90 tablet 0   PEG-KCl-NaCl-NaSulf-Na Asc-C (PLENVU) 140 g SOLR Take 1 kit by mouth as directed. Use coupon: BIN: [id number] PNC: CNRX Group: [id number] ID: [id number] 1 each 0   rosuvastatin (CRESTOR) 10 MG tablet Take 1 tablet by mouth once daily 90 tablet 2    triamterene-hydrochlorothiazide (MAXZIDE-25) 37.5-25 MG tablet Take 1/2 (one-half) tablet by mouth once daily 45 tablet 2   No current facility-administered medications for this visit.    REVIEW OF SYSTEMS:   Constitutional: ( - ) fevers, ( - )  chills , ( - ) night sweats Eyes: ( - ) blurriness of vision, ( - ) double vision, ( - ) watery eyes Ears, nose, mouth, throat, and face: ( - ) mucositis, ( - ) sore throat Respiratory: ( - ) cough, ( - ) dyspnea, ( - ) wheezes Cardiovascular: ( - )  palpitation, ( - ) chest discomfort, ( - ) lower extremity swelling Gastrointestinal:  ( - ) nausea, ( - ) heartburn, ( - ) change in bowel habits Skin: ( - ) abnormal skin rashes Lymphatics: ( - ) new lymphadenopathy, ( - ) easy bruising Neurological: ( - ) numbness, ( - ) tingling, ( - ) new weaknesses Behavioral/Psych: ( - ) mood change, ( - ) new changes  All other systems were reviewed with the patient and are negative.  PHYSICAL EXAMINATION:  Vitals:   01/13/21 1337  BP: (!) 157/61  Pulse: 74  Resp: 20  Temp: 98.2 F (36.8 C)  SpO2: 99%   Filed Weights   01/13/21 1337  Weight: 173 lb 12.8 oz (78.8 kg)    GENERAL: well appearing elderly Caucasian male in NAD  SKIN: skin color, texture, turgor are normal, no rashes or significant lesions EYES: conjunctiva are pink and non-injected, sclera clear LUNGS: clear to auscultation and percussion with normal breathing effort HEART: regular rate & rhythm and no murmurs and no lower extremity edema PSYCH: alert & oriented x 3, fluent speech NEURO: no focal motor/sensory deficits  LABORATORY DATA:  I have reviewed the data as listed CBC Latest Ref Rng & Units 01/13/2021 10/12/2020 06/17/2020  WBC 4.0 - 10.5 K/uL 7.6 8.3 6.0  Hemoglobin 13.0 - 17.0 g/dL 15.4 15.8 15.5  Hematocrit 39.0 - 52.0 % 44.8 46.5 46.3  Platelets 150 - 400 K/uL 210 179.0 199.0    CMP Latest Ref Rng & Units 01/13/2021 12/09/2020 12/09/2020  Glucose 70 - 99 mg/dL 72 - -   BUN 8 - 23 mg/dL 18 - -  Creatinine 0.61 - 1.24 mg/dL 1.29(H) - -  Sodium 135 - 145 mmol/L 140 - -  Potassium 3.5 - 5.1 mmol/L 4.2 - -  Chloride 98 - 111 mmol/L 103 - -  CO2 22 - 32 mmol/L 29 - -  Calcium 8.9 - 10.3 mg/dL 9.5 - -  Total Protein 6.5 - 8.1 g/dL 7.4 - 7.6  Total Bilirubin 0.3 - 1.2 mg/dL 1.0 - 1.1  Alkaline Phos 38 - 126 U/L 165(H) 195(H) 164(H)  AST 15 - 41 U/L 28 - 22  ALT 0 - 44 U/L 32 - 26   RADIOGRAPHIC STUDIES: No results found.  ASSESSMENT & PLAN Logan [name] 70 y.o. male with medical history significant for CAD, DM type II, HLD, HTN, and diabetic retinopathy who presents for evaluation of hereditary hemachromatosis.   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  The patient was previously treated with phlebotomy when he was previously diagnosed by the GI department.  Due to his recent elevations in ferritin he was referred to hematology in order to manage his disorder.  Our recommendation would be for a goal ferritin of less than 50.  We will achieve this with phlebotomies performed every 2 weeks until he reaches target.  We will plan to start treatments next week.  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 --We will start phlebotomy every 2 weeks until patient reaches target ferritin --Plan for starting of phlebotomies next week with every 2 weekly labs and a follow-up clinic visit in 2 months time.  Orders Placed This Encounter  Procedures   CBC with Differential (  [place] Only)    Standing Status:   Future    Number of Occurrences:   1    Standing Expiration Date:   01/13/2022   CMP ([facility] [place] only)    Standing Status:   Future    Number of Occurrences:   1     Standing Expiration Date:   01/13/2022   Ferritin    Standing Status:   Future    Number of Occurrences:   1    Standing Expiration Date:   01/13/2022   Iron and TIBC    Standing Status:   Future    Number of Occurrences:   1    Standing Expiration Date:   01/13/2022    All questions were answered. The patient knows to call the clinic with any problems, questions or concerns.  A total of more than 60 minutes were spent on this encounter with face-to-face time and non-face-to-face time, including preparing to see the patient, ordering tests and/or medications, counseling the patient and coordination of care as outlined above.   Ledell Peoples, MD Department of Hematology/Oncology [place] [facility] Phone: [phone] Pager: [phone] Email: [email or web address]  01/13/2021 6:05 PM  Literature Support:  Nada Boozer., Altes, A., Brissot, P. et al. Therapeutic recommendations in HFE hemochromatosis for p.Cys282Tyr (C282Y/C282Y) homozygous genotype. Hepatol Int 12, 83-86 (2018).   --A phlebotomy schedule of the order of 400-500 mL, considering body weight, weekly or every 2 weeks has been proposed   --The objective in this phase is usually to reach serum ferritin at 50 g/L provided that there is no anemia.  --Serum ferritin should be checked once a month until the values reach the upper normal limits, and every 2 weeks thereafter, until the final goal of serum ferritin is reached

## 2021-01-19 ENCOUNTER — Inpatient Hospital Stay: Payer: Medicare Other

## 2021-01-19 ENCOUNTER — Other Ambulatory Visit: Payer: Self-pay | Admitting: Hematology and Oncology

## 2021-01-19 ENCOUNTER — Other Ambulatory Visit: Payer: Self-pay

## 2021-01-19 ENCOUNTER — Other Ambulatory Visit: Payer: Self-pay | Admitting: *Deleted

## 2021-01-19 DIAGNOSIS — E785 Hyperlipidemia, unspecified: Secondary | ICD-10-CM | POA: Diagnosis not present

## 2021-01-19 DIAGNOSIS — E11319 Type 2 diabetes mellitus with unspecified diabetic retinopathy without macular edema: Secondary | ICD-10-CM | POA: Diagnosis not present

## 2021-01-19 DIAGNOSIS — E119 Type 2 diabetes mellitus without complications: Secondary | ICD-10-CM | POA: Diagnosis not present

## 2021-01-19 DIAGNOSIS — I2510 Atherosclerotic heart disease of native coronary artery without angina pectoris: Secondary | ICD-10-CM | POA: Diagnosis not present

## 2021-01-19 DIAGNOSIS — I10 Essential (primary) hypertension: Secondary | ICD-10-CM | POA: Diagnosis not present

## 2021-01-19 LAB — CBC WITH DIFFERENTIAL (CANCER CENTER ONLY)
Abs Immature Granulocytes: 0.01 10*3/uL (ref 0.00–0.07)
Basophils Absolute: 0.1 10*3/uL (ref 0.0–0.1)
Basophils Relative: 1 %
Eosinophils Absolute: 0.5 10*3/uL (ref 0.0–0.5)
Eosinophils Relative: 6 %
HCT: 41.8 % (ref 39.0–52.0)
Hemoglobin: 14.6 g/dL (ref 13.0–17.0)
Immature Granulocytes: 0 %
Lymphocytes Relative: 28 %
Lymphs Abs: 2 10*3/uL (ref 0.7–4.0)
MCH: 31.9 pg (ref 26.0–34.0)
MCHC: 34.9 g/dL (ref 30.0–36.0)
MCV: 91.5 fL (ref 80.0–100.0)
Monocytes Absolute: 0.8 10*3/uL (ref 0.1–1.0)
Monocytes Relative: 11 %
Neutro Abs: 3.8 10*3/uL (ref 1.7–7.7)
Neutrophils Relative %: 54 %
Platelet Count: 196 10*3/uL (ref 150–400)
RBC: 4.57 MIL/uL (ref 4.22–5.81)
RDW: 13.2 % (ref 11.5–15.5)
WBC Count: 7 10*3/uL (ref 4.0–10.5)
nRBC: 0 % (ref 0.0–0.2)

## 2021-01-19 LAB — FERRITIN: Ferritin: 129 ng/mL (ref 24–336)

## 2021-01-19 NOTE — Patient Instructions (Signed)
Goals Addressed             This Visit's Progress    [facility]) Learn More About My Health   On track    Timeframe:  Long-Range Goal Priority:  Medium Start Date:  [id number]                   Expected End Date:     [id number]                 Follow Up Date [id number]   - tell my story and reason for my visit - make a list of questions - ask questions - repeat what I heard to make sure I understand - bring a list of my medicines to the visit - speak up when I don't understand    Why is this important?   The best way to learn about your health and care is by talking to the doctor and nurse.  They will answer your questions and give you information in the way that you like best.    Notes:  [id number] RN sent Guide for journey with diabetes booklet     [facility]) Make and Keep All Appointments   On track    Timeframe:  Long-Range Goal Priority:  Medium Start Date:    [id number]                         Expected End Date:  [id number]                    Follow Up Date [id number]   - ask family or friend for a ride - call to cancel if needed - keep a calendar with appointment dates    Why is this important?   Part of staying healthy is seeing the doctor for follow-up care.  If you forget your appointments, there are some things you can do to stay on track.    Notes:  Discuss with son need for cataract surgery and need for transportation from family or friends and schedule surgery soon as transportation can be arranged; attend scheduled endocrinology appointment  [id number] need to reschedule cataract [id number] Patient still needs to reschedule cataract surgeries     [facility]) Monitor and Manage My Blood Sugar   On track    Timeframe:  Long-Range Goal Priority:  High Start Date:  [id number]                           Expected End Date:      [id number]                 Follow Up Date [id number]  - check blood sugar at prescribed times - check blood sugar before and after exercise - check blood sugar if I  feel it is too high or too low - enter blood sugar readings and medication or insulin into daily log - take the blood sugar log to all doctor visits - take the blood sugar meter to all doctor visits    Why is this important?   Checking your blood sugar at home helps to keep it from getting very high or very low.  Writing the results in a diary or log helps the doctor know how to care for you.  Your blood sugar log should have the time, the date and the results.  Also, write down the amount of insulin or other medicine  you take.  Other information like what you ate, exercise done and how you were feeling will also be helpful..     Notes:  [id number] Patient has a free style libre. He is monitoring his blood sugars as per ordered     [facility]) Set My Target A1C   On track    Timeframe:  Long-Range Goal Priority:  High Start Date:  [id number]                           Expected End Date:    [id number]                  Follow Up Date [id number]   - set target A1C--Target 7.5; Current 9.2    Why is this important?   Your target A1C is decided together by you and your doctor.  It is based on several things like your age and other health issues.    Notes:  Goal now 7.0 and A1C is 7.3 now down form 9.2 01/19/2021 Patient A1C 7.8     ([facility])Monitor and Manage My Blood Sugar-Diabetes Type 2   On track    Timeframe:  Long-Range Goal Priority:  High Start Date:    [id number]                         Expected End Date:       [id number]                Follow Up Date [id number]   - check blood sugar at prescribed times - check blood sugar before and after exercise - check blood sugar if I feel it is too high or too low - take the blood sugar meter to all doctor visits    Why is this important?   Checking your blood sugar at home helps to keep it from getting very high or very low.  Writing the results in a diary or log helps the doctor know how to care for you.  Your blood sugar log should have the time,  date and the results.  Also, write down the amount of insulin or other medicine that you take.  Other information, like what you ate, exercise done and how you were feeling, will also be helpful.     Notes:  [id number] Patient A1C 7.8     ([facility])Obtain Eye Exam-Diabetes Type 2   On track    Timeframe:  Long-Range Goal Priority:  Medium Start Date:        07/27/2020                     Expected End Date:    [id number]                  Follow Up Date [id number]   - keep appointment with eye doctor    Why is this important?   Eye check-ups are important when you have diabetes.  Vision loss can be prevented.    Notes:  [id number] last eye exam was 09/27/2019 [id number] Per patient he has not went back since he cancelled his cataract surgery     ([facility])Set My Target A1C-Diabetes Type 2   On track    Timeframe:  Long-Range Goal Priority:  High Start Date:     [id number]  Expected End Date:      [id number]                 Follow Up Date [id number]  - set target A1C    Why is this important?   Your target A1C is decided together by you and your doctor.  It is based on several things like your age and other health issues.    Notes:  7.0 goal [id number]  A1C 7.8     [facility]) Perform Foot Care   On track    Timeframe:  Long-Range Goal Priority:  Medium Start Date:   [id number]                          Expected End Date: [id number]                     Follow Up Date [id number]  - check feet daily for cuts, sores or redness - wash and dry feet carefully every day - wear comfortable, cotton socks - wear comfortable, well-fitting shoes    Why is this important?   Good foot care is very important when you have diabetes.  There are many things you can do to keep your feet healthy and catch a problem early.    Notes:  [id number] Per patient his feet look good. He has some neuropathy [id number] Per patient he has neuropathy

## 2021-01-19 NOTE — Patient Instructions (Signed)
[facility] ONCOLOGY  Discharge Instructions: Thank you for choosing [facility] to provide your oncology and hematology care.   If you have a lab appointment with the [facility], please go directly to the [facility] and check in at the registration area.   Wear comfortable clothing and clothing appropriate for easy access to any Portacath or PICC line.   We strive to give you quality time with your provider. You may need to reschedule your appointment if you arrive late (15 or more minutes).  Arriving late affects you and other patients whose appointments are after yours.  Also, if you miss three or more appointments without notifying the office, you may be dismissed from the clinic at the provider's discretion.      For prescription refill requests, have your pharmacy contact our office and allow 72 hours for refills to be completed.        To help prevent nausea and vomiting after your treatment, we encourage you to take your nausea medication as directed.  BELOW ARE SYMPTOMS THAT SHOULD BE REPORTED IMMEDIATELY: *FEVER GREATER THAN 100.4 F (38 C) OR HIGHER *CHILLS OR SWEATING *NAUSEA AND VOMITING THAT IS NOT CONTROLLED WITH YOUR NAUSEA MEDICATION *UNUSUAL SHORTNESS OF BREATH *UNUSUAL BRUISING OR BLEEDING *URINARY PROBLEMS (pain or burning when urinating, or frequent urination) *BOWEL PROBLEMS (unusual diarrhea, constipation, pain near the anus) TENDERNESS IN MOUTH AND THROAT WITH OR WITHOUT PRESENCE OF ULCERS (sore throat, sores in mouth, or a toothache) UNUSUAL RASH, SWELLING OR PAIN  UNUSUAL VAGINAL DISCHARGE OR ITCHING   Items with * indicate a potential emergency and should be followed up as soon as possible or go to the Emergency Department if any problems should occur.  Please show the CHEMOTHERAPY ALERT CARD or IMMUNOTHERAPY ALERT CARD at check-in to the Emergency Department and triage nurse.  Should you have questions after your  visit or need to cancel or reschedule your appointment, please contact [facility]  Dept: [phone]  and follow the prompts.  Office hours are 8:00 a.m. to 4:30 p.m. Monday - Friday. Please note that voicemails left after 4:00 p.m. may not be returned until the following business day.  We are closed weekends and major holidays. You have access to a nurse at all times for urgent questions. Please call the main number to the clinic Dept: [phone] and follow the prompts.   For any non-urgent questions, you may also contact your provider using MyChart. We now offer e-Visits for anyone 28 and older to request care online for non-urgent symptoms. For details visit [email or web address].   Also download the MyChart app! Go to the app store, search "MyChart", open the app, select [facility], and log in with your MyChart username and password.  Due to Covid, a mask is required upon entering the hospital/clinic. If you do not have a mask, one will be given to you upon arrival. For doctor visits, patients may have 1 support person aged 43 or older with them. For treatment visits, patients cannot have anyone with them due to current Covid guidelines and our immunocompromised population.  Therapeutic Phlebotomy, Care After This sheet gives you information about how to care for yourself after your procedure. Your health care provider may also give you more specific instructions. If you have problems or questions, contact your health careprovider. What can I expect after the procedure? After the procedure, it is common to have: Light-headedness or dizziness. You may feel faint.  Nausea. Tiredness (fatigue). Follow these instructions at home: Eating and drinking Be sure to eat well-balanced meals for the next 24 hours. Drink enough fluid to keep your urine pale yellow. Avoid drinking alcohol on the day that you had the procedure. Activity  Return to your normal activities  as told by your health care provider. Most people can go back to their normal activities right away. Avoid activities that take a lot of effort for about 5 hours after the procedure. Athletes should avoid strenuous exercise for at least 12 hours. Avoid heavy lifting or pulling for about 5 hours after the procedure. Do not lift anything that is heavier than 10 lb (4.5 kg). Change positions slowly for the remainder of the day. This will help to prevent light-headedness or fainting. If you feel light-headed, lie down until the feeling goes away.  Needle insertion site care  Keep your bandage (dressing) dry. You can remove the bandage after about 5 hours or as told by your health care provider. If you have bleeding from the needle insertion site, raise (elevate) your arm and press firmly on the site until the bleeding stops. If you have bruising at the site, apply ice to the area: Remove the dressing. Put ice in a plastic bag. Place a towel between your skin and the bag. Leave the ice on for 20 minutes, 2-3 times a day for the first 24 hours. If the swelling does not go away after 24 hours, apply a warm, moist cloth (warm compress) to the area for 20 minutes, 2-3 times a day.  General instructions Do not use any products that contain nicotine or tobacco, such as cigarettes and e-cigarettes, for at least 30 minutes after the procedure. Keep all follow-up visits as told by your health care provider. This is important. You may need to continue having regular therapeutic phlebotomy treatments as directed. Contact a health care provider if you: Have redness, swelling, or pain at the needle insertion site. Have fluid or blood coming from the needle insertion site. Have pus or a bad smell coming from the needle insertion site. Notice that the needle insertion site feels warm to the touch. Feel light-headed, dizzy, or nauseous, and the feeling does not go away. Have new bruising at the needle insertion  site. Feel weaker than normal. Have a fever or chills. Get help right away if: You faint. You have chest pain. You have trouble breathing. You have severe nausea or vomiting. Summary After the procedure, it is common to have some light-headedness, dizziness, nausea, or tiredness (fatigue). Be sure to eat well-balanced meals for the next 24 hours. Drink enough fluid to keep your urine pale yellow. Return to your normal activities as told by your health care provider. Keep all follow-up visits as told by your health care provider. You may need to continue having regular therapeutic phlebotomy treatments as directed. This information is not intended to replace advice given to you by your health care provider. Make sure you discuss any questions you have with your healthcare provider. Document Revised: 06/09/2017 Document Reviewed: 06/08/2017 Elsevier Patient Education  [place].

## 2021-01-19 NOTE — Progress Notes (Signed)
Logan [name] presents today for phlebotomy per MD orders. Phlebotomy procedure started at 1530and ended at 1537. 500 cc removed. Patient tolerated procedure well. Snack offered. IV needle removed intact.  Site wrapped with sterile gauze and coban.    1612.  Pt denies lightheadedness or dizziness.  Pt states "I am fine."  Pt left the unit ambulatory.

## 2021-01-19 NOTE — Patient Outreach (Signed)
[facility]) Care Management  [place]  01/19/2021   Logan [name] 1951-02-03 [id number]  [place] telephone call to patient.  Hip[employer] compliance verified. Per patient his blood sugar was 160 after breakfast today. His A1C has increased to 7.8. Per patient he has hypoglycemia  twice a week. Per patient he has no symptoms until his blood sugar drops to 50. He eats something when this occurs. Patient stated that he has some neuropathy. Patient stated that he walks 4- 5 miles at least 3x week. Patient has not rescheduled his cataract surgery. Per patient he has received his COVID boosters. Patient   has agreed to follow up outreach calls.   Encounter Medications:  Outpatient Encounter Medications as of 01/19/2021  Medication Sig Note   insulin NPH-regular Human (NOVOLIN 70/30) (70-30) 100 UNIT/ML injection Inject 20 Units into the skin 2 (two) times daily with a meal. 03/19/2020: Reports taking 17 units twice a day   amLODipine (NORVASC) 10 MG tablet Take 1 tablet by mouth once daily    aspirin 81 MG tablet Take 81 mg by mouth 2 (two) times daily.    Continuous Blood Gluc Receiver (FREESTYLE LIBRE 14 DAY READER) DEVI     Continuous Blood Gluc Sensor (FREESTYLE LIBRE 14 DAY SENSOR) MISC     insulin glargine (LANTUS) 100 UNIT/ML injection INJECT 20 UNITS SUBCUTANEOUSLY ONCE DAILY AT BEDTIME 01/19/2021: Per patient he takes 12units or less   irbesartan (AVAPRO) 300 MG tablet Take 1 tablet (300 mg total) by mouth daily.    levothyroxine (SYNTHROID) 100 MCG tablet Take 1 tablet by mouth once daily    PEG-KCl-NaCl-NaSulf-Na Asc-C (PLENVU) 140 g SOLR Take 1 kit by mouth as directed. Use coupon: BIN: [id number] PNC: CNRX Group: [id number] ID: [id number] (Patient not taking: Reported on 01/19/2021)    rosuvastatin (CRESTOR) 10 MG tablet Take 1 tablet by mouth once daily    triamterene-hydrochlorothiazide (MAXZIDE-25) 37.5-25 MG tablet Take 1/2 (one-half) tablet by mouth once  daily    No facility-administered encounter medications on file as of 01/19/2021.    Functional Status:  In your present state of health, do you have any difficulty performing the following activities: 06/01/2020  Hearing? N  Vision? N  Difficulty concentrating or making decisions? N  Walking or climbing stairs? N  Dressing or bathing? N  Doing errands, shopping? N  Some recent data might be hidden    Fall/Depression Screening: Fall Risk  06/01/2020 07/04/2018 07/03/2018  Falls in the past year? 0 0 0  Number falls in past yr: - - 0  Injury with Fall? - - 0  Follow up - - Falls evaluation completed   PHQ 2/9 Scores 03/19/2020 01/23/2019 07/03/2018 06/20/2017 03/30/2016 10/22/2014 01/08/2014  PHQ - 2 Score 0 0 0 0 0 0 0    Assessment:   Care Plan Care Plan : Diabetes Type 1 (Adult)  Updates made by Verlin Grills, RN since 01/19/2021 12:00 AM     Problem: Glycemic Management (Diabetes, Type 1)   Priority: High  Onset Date: 07/27/2020     Long-Range Goal: Glycemic Management Optimized   Start Date: 07/27/2020  Expected End Date: 06/04/2021  Priority: High  Note:   Evidence-based guidance:  Anticipate A1C testing (point-of-care) every 3 to 6 months based on goal attainment.  Review mutually set A1C goal or target range.  Anticipate screening for autoimmune thyroid disease, vitamin B12 deficiency with anemia and celiac disease based on presenting signs/symptoms.  Anticipate use  of insulin with periodic adjustments; consider active involvement of pharmacist.  Provide medical nutrition therapy and development of individualized eating plan.  Compare self-reported symptoms of hypo or hyperglycemia to blood glucose levels, diet and fluid intake, current medications, psychosocial and physiologic stressors, change in activity and barriers to care adherence.  Promote self-monitoring of blood glucose levels, interval or continuous.  Assess and address barriers to management plan, such  as food insecurity, age, developmental ability, depression, anxiety, fear of hypoglycemia or weight gain, as well as medication cost, side effects and complicated regimen.  Assess for disordered eating behaviors based on presenting signs/symptoms and risk factors.  Consider referral to community-based diabetes education program, visiting nurse, community health worker or health coach.  Encourage regular dental care for treatment of periodontal disease; refer to dental provider when needed.   Notes:     Task: Alleviate Barriers to Glycemic Management   Due Date: 06/04/2021  Note:   Care Management Activities:    - barriers to adherence to treatment plan identified - blood glucose monitoring encouraged - blood glucose readings reviewed - mutual A1C goal set or reviewed - resources required to improve adherence to care identified - self-awareness of signs/symptoms of hypo or hyperglycemia encouraged - use of blood glucose monitoring log promoted    Notes:     Problem: Disease Progression (Diabetes, Type 1)   Priority: Medium  Onset Date: 01/24/2021     Long-Range Goal: Disease Progression Prevented or Minimized   Start Date: 07/27/2020  Expected End Date: 06/04/2021  This Visit's Progress: On track  Priority: Medium  Note:   Evidence-based guidance:  Prepare patient for laboratory and diagnostic exams based on risk and presentation.  Encourage lifestyle changes, such as increased intake of plant-based foods, stress reduction, regular physical activity and smoking cessation to prevent long-term complications and chronic diseases.  Discourage sedentary behavior or sitting longer than 30 minutes without interruption; individualize exercise or activity recommendations with potential limitations, such as neuropathy, retinopathy or the ability to prevent   hyperglycemia or hypoglycemia.  Prepare patient for use of pharmacologic therapy that may include antihypertensive, analgesic,  prostaglandin E1 with periodic adjustments based on presenting chronic condition and laboratory results.  Assess signs/symptoms and risk factors for hypertension, sleep-disordered breathing, neuropathy (including changes in gait and balance), retinopathy, nephropathy and sexual dysfunction.  Address pregnancy planning and contraceptive choice, especially when prescribing antihypertensive or statin.  Implement additional individualized goals and interventions based on identified risk factors.  Prepare patient for consultation or referral for specialist care, such as ophthalmology, neurology, cardiology, podiatry, nephrology or perinatology.   Notes:     Task: Monitor and Manage Follow-up for Comorbidities   Due Date: 06/04/2021  Note:   Care Management Activities:    - activity based on tolerance and functional limitations encouraged - completion of annual dilated eye exam confirmed - completion of annual foot exam verified - healthy lifestyle promoted - quality of sleep assessed - signs/symptoms of comorbidities identified    Notes:       Goals Addressed             This Visit's Progress    [facility]) Learn More About My Health   On track    Timeframe:  Long-Range Goal Priority:  Medium Start Date:  [id number]                   Expected End Date:     [id number]  Follow Up Date [id number]   - tell my story and reason for my visit - make a list of questions - ask questions - repeat what I heard to make sure I understand - bring a list of my medicines to the visit - speak up when I don't understand    Why is this important?   The best way to learn about your health and care is by talking to the doctor and nurse.  They will answer your questions and give you information in the way that you like best.    Notes:  [id number] RN sent Guide for journey with diabetes booklet     [facility]) Make and Keep All Appointments   On track    Timeframe:  Long-Range Goal Priority:   Medium Start Date:    79892119                         Expected End Date:  [id number]                    Follow Up Date [id number]   - ask family or friend for a ride - call to cancel if needed - keep a calendar with appointment dates    Why is this important?   Part of staying healthy is seeing the doctor for follow-up care.  If you forget your appointments, there are some things you can do to stay on track.    Notes:  Discuss with son need for cataract surgery and need for transportation from family or friends and schedule surgery soon as transportation can be arranged; attend scheduled endocrinology appointment  [id number] need to reschedule cataract [id number] Patient still needs to reschedule cataract surgeries     [facility]) Monitor and Manage My Blood Sugar   On track    Timeframe:  Long-Range Goal Priority:  High Start Date:  74128786                           Expected End Date:      [id number]                 Follow Up Date [id number]  - check blood sugar at prescribed times - check blood sugar before and after exercise - check blood sugar if I feel it is too high or too low - enter blood sugar readings and medication or insulin into daily log - take the blood sugar log to all doctor visits - take the blood sugar meter to all doctor visits    Why is this important?   Checking your blood sugar at home helps to keep it from getting very high or very low.  Writing the results in a diary or log helps the doctor know how to care for you.  Your blood sugar log should have the time, the date and the results.  Also, write down the amount of insulin or other medicine you take.  Other information like what you ate, exercise done and how you were feeling will also be helpful..     Notes:  [id number] Patient has a free style libre. He is monitoring his blood sugars as per ordered     [facility]) Set My Target A1C   On track    Timeframe:  Long-Range Goal Priority:  High Start Date:  [id number]  Expected End Date:    67124580                  Follow Up Date [id number]   - set target A1C--Target 7.5; Current 9.2    Why is this important?   Your target A1C is decided together by you and your doctor.  It is based on several things like your age and other health issues.    Notes:  Goal now 7.0 and A1C is 7.3 now down form 9.2 01/19/2021 Patient A1C 7.8     ([facility])Monitor and Manage My Blood Sugar-Diabetes Type 2   On track    Timeframe:  Long-Range Goal Priority:  High Start Date:    05397673                         Expected End Date:       41937902                Follow Up Date [id number]   - check blood sugar at prescribed times - check blood sugar before and after exercise - check blood sugar if I feel it is too high or too low - take the blood sugar meter to all doctor visits    Why is this important?   Checking your blood sugar at home helps to keep it from getting very high or very low.  Writing the results in a diary or log helps the doctor know how to care for you.  Your blood sugar log should have the time, date and the results.  Also, write down the amount of insulin or other medicine that you take.  Other information, like what you ate, exercise done and how you were feeling, will also be helpful.     Notes:  [id number] Patient A1C 7.8     ([facility])Obtain Eye Exam-Diabetes Type 2   On track    Timeframe:  Long-Range Goal Priority:  Medium Start Date:        07/27/2020                     Expected End Date:    79892119                  Follow Up Date [id number]   - keep appointment with eye doctor    Why is this important?   Eye check-ups are important when you have diabetes.  Vision loss can be prevented.    Notes:  [id number] last eye exam was 09/27/2019 [id number] Per patient he has not went back since he cancelled his cataract surgery     ([facility])Set My Target A1C-Diabetes Type 2   On track    Timeframe:  Long-Range Goal Priority:   High Start Date:     85885027                        Expected End Date:      74128786                 Follow Up Date 76720947  - set target A1C    Why is this important?   Your target A1C is decided together by you and your doctor.  It is based on several things like your age and other health issues.    Notes:  7.0 goal [id number]  A1C 7.8     [facility]) Perform Foot Care   On track  Timeframe:  Long-Range Goal Priority:  Medium Start Date:   84210312                          Expected End Date: 81188677                     Follow Up Date 37366815  - check feet daily for cuts, sores or redness - wash and dry feet carefully every day - wear comfortable, cotton socks - wear comfortable, well-fitting shoes    Why is this important?   Good foot care is very important when you have diabetes.  There are many things you can do to keep your feet healthy and catch a problem early.    Notes:  [id number] Per patient his feet look good. He has some neuropathy [id number] Per patient he has neuropathy        Plan:  Follow-up: Follow-up in 3 month(s) RN sent Guide for journey with diabetes booklet RN discussed hypoglycemia RN sent update assessment to PCP  [facility] [phone]

## 2021-02-02 ENCOUNTER — Inpatient Hospital Stay: Payer: Medicare Other

## 2021-02-02 ENCOUNTER — Other Ambulatory Visit: Payer: Self-pay | Admitting: Hematology and Oncology

## 2021-02-02 ENCOUNTER — Other Ambulatory Visit: Payer: Self-pay

## 2021-02-02 DIAGNOSIS — E11319 Type 2 diabetes mellitus with unspecified diabetic retinopathy without macular edema: Secondary | ICD-10-CM | POA: Diagnosis not present

## 2021-02-02 DIAGNOSIS — E119 Type 2 diabetes mellitus without complications: Secondary | ICD-10-CM | POA: Diagnosis not present

## 2021-02-02 DIAGNOSIS — I10 Essential (primary) hypertension: Secondary | ICD-10-CM | POA: Diagnosis not present

## 2021-02-02 DIAGNOSIS — E785 Hyperlipidemia, unspecified: Secondary | ICD-10-CM | POA: Diagnosis not present

## 2021-02-02 DIAGNOSIS — I2510 Atherosclerotic heart disease of native coronary artery without angina pectoris: Secondary | ICD-10-CM | POA: Diagnosis not present

## 2021-02-02 LAB — CBC WITH DIFFERENTIAL (CANCER CENTER ONLY)
Abs Immature Granulocytes: 0.02 10*3/uL (ref 0.00–0.07)
Basophils Absolute: 0.1 10*3/uL (ref 0.0–0.1)
Basophils Relative: 1 %
Eosinophils Absolute: 0.4 10*3/uL (ref 0.0–0.5)
Eosinophils Relative: 4 %
HCT: 39.5 % (ref 39.0–52.0)
Hemoglobin: 13.5 g/dL (ref 13.0–17.0)
Immature Granulocytes: 0 %
Lymphocytes Relative: 18 %
Lymphs Abs: 1.8 10*3/uL (ref 0.7–4.0)
MCH: 31.8 pg (ref 26.0–34.0)
MCHC: 34.2 g/dL (ref 30.0–36.0)
MCV: 92.9 fL (ref 80.0–100.0)
Monocytes Absolute: 0.8 10*3/uL (ref 0.1–1.0)
Monocytes Relative: 9 %
Neutro Abs: 6.7 10*3/uL (ref 1.7–7.7)
Neutrophils Relative %: 68 %
Platelet Count: 214 10*3/uL (ref 150–400)
RBC: 4.25 MIL/uL (ref 4.22–5.81)
RDW: 13.7 % (ref 11.5–15.5)
WBC Count: 9.7 10*3/uL (ref 4.0–10.5)
nRBC: 0 % (ref 0.0–0.2)

## 2021-02-02 LAB — FERRITIN: Ferritin: 78 ng/mL (ref 24–336)

## 2021-02-02 NOTE — Patient Instructions (Signed)

## 2021-02-02 NOTE — Progress Notes (Signed)
Pt presented for phlebotomy procedure per MD note and orders. 16g phlebotomy kit used in RAC, started at 1455 and ended at 1459, 527g removed. VSS, food and drink offered. Pt tolerated well and was discharged in stable condition after a 20 min observation.

## 2021-02-05 ENCOUNTER — Telehealth: Payer: Self-pay

## 2021-02-05 NOTE — Progress Notes (Signed)
Chronic Care Management Pharmacy Assistant   Name: Logan [name]  MRN: [id number] DOB: 03-06-1951  Logan [name] is an 70 y.o. year old male who presents for his initial CCM visit with the clinical pharmacist.   Recent office visits:  11/30/20 - Janith Lima MD - Seen for general follow up -  Shingrix vaccine administered and start on Gvoke HypoPen 2-Pack - Referral to Gastroenterology - Labs ordered - Follow up in 4 months 10/12/20 Janith Lima MD - Seen for abdominal pain - Labs ordered - discontinued Gvoke HypoPen 2-Pack - follow up in 3 months  09/21/20 Biagio Borg MD - Seen for LUQ pain - Increased avapro to 300 daily, Stop bystolic - Referral for CT scan to rule out splenic infarct - Labs ordered - follow up in 1-2 wks  to check b/p and also a blood test to check kidneys and potassium  08/18/20 Janith Lima MD - Seen for hypertension and hypothyroidism follow up - Start nebivolol (BYSTOLIC) 5 MG tablet;  Take 1 tablet (5 mg total) by mouth daily, and start Gvoke HypoPen 2-Pack - Labs ordered - Follow up in 3 months  Recent consult visits:  01/13/21 Narda Rutherford MD - Oncology - 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   Hospital visits:  None in previous 6 months  Medications: Outpatient Encounter Medications as of 02/05/2021  Medication Sig Note   amLODipine (NORVASC) 10 MG tablet Take 1 tablet by mouth once daily    aspirin 81 MG tablet Take 81 mg by mouth 2 (two) times daily.    Continuous Blood Gluc Receiver (FREESTYLE LIBRE 14 DAY READER) DEVI     Continuous Blood Gluc Sensor (FREESTYLE LIBRE 14 DAY SENSOR) MISC     insulin glargine (LANTUS) 100 UNIT/ML injection INJECT 20 UNITS SUBCUTANEOUSLY ONCE DAILY AT BEDTIME 01/19/2021: Per  patient he takes 12units or less   insulin NPH-regular Human (NOVOLIN 70/30) (70-30) 100 UNIT/ML injection Inject 20 Units into the skin 2 (two) times daily with a meal. 03/19/2020: Reports taking 17 units twice a day   irbesartan (AVAPRO) 300 MG tablet Take 1 tablet (300 mg total) by mouth daily.    levothyroxine (SYNTHROID) 100 MCG tablet Take 1 tablet by mouth once daily    PEG-KCl-NaCl-NaSulf-Na Asc-C (PLENVU) 140 g SOLR Take 1 kit by mouth as directed. Use coupon: BIN: [id number] PNC: CNRX Group: [id number] ID: [id number] (Patient not taking: Reported on 01/19/2021)    rosuvastatin (CRESTOR) 10 MG tablet Take 1 tablet by mouth once daily    triamterene-hydrochlorothiazide (MAXZIDE-25) 37.5-25 MG tablet Take 1/2 (one-half) tablet by mouth once daily    No facility-administered encounter medications on file as of 02/05/2021.   amLODipine (NORVASC) 10 MG tablet Last filled: 12/27/20 90 DS aspirin 81 MG tablet Last fill not listed insulin glargine (LANTUS) 100 UNIT/ML injection Last filled: 09/18/20 100 DS irbesartan (AVAPRO) 300 MG tablet Last filled:  12/22/20 90 DS levothyroxine (SYNTHROID) 100 MCG tablet Last filled: 12/27/20 90 DS rosuvastatin (CRESTOR) 10 MG tablet Last filled: 12/13/20 90 DS triamterene-hydrochlorothiazide (MAXZIDE-25) 37.5-25 MG tablet Last filled: 12/13/20 90 DS    Star [facility] Drugs: rosuvastatin (CRESTOR) 10 MG tablet Last filled: 12/13/20 90 DS insulin glargine (LANTUS)  100 UNIT/ML injection Last filled: 09/18/20 100 DS rosuvastatin (CRESTOR) 10 MG tablet Last filled: 12/13/20 90 DS   Andee Poles, CMA

## 2021-02-09 ENCOUNTER — Other Ambulatory Visit: Payer: Self-pay

## 2021-02-09 ENCOUNTER — Ambulatory Visit (INDEPENDENT_AMBULATORY_CARE_PROVIDER_SITE_OTHER): Payer: Medicare Other

## 2021-02-09 DIAGNOSIS — I10 Essential (primary) hypertension: Secondary | ICD-10-CM

## 2021-02-09 DIAGNOSIS — E785 Hyperlipidemia, unspecified: Secondary | ICD-10-CM

## 2021-02-09 DIAGNOSIS — E039 Hypothyroidism, unspecified: Secondary | ICD-10-CM

## 2021-02-09 DIAGNOSIS — E109 Type 1 diabetes mellitus without complications: Secondary | ICD-10-CM

## 2021-02-09 NOTE — Progress Notes (Signed)
Chronic Care Management Pharmacy Note  02/09/2021 Name:  Logan [name] MRN:  [id number] DOB:  March 13, 1951  Summary: - Patient reports that he feels he is doing well, reports that he checks blood pressure regularly at home, typically averaging 125-140/60-70, denies any issues with hypotension, notes at time he can feel fatigued at times, but never issues with presyncopal symptoms -uses freestyle libre to monitor blood sugars, notes that in AM can have hypoglycemic readings which he will eat honey to correct, reports that it does happen more often in the warmer weather, but is able to manage, follows with Dr. Chalmers Cater  -following with phlebotomy every 2 weeks at this time for treatment of hemachromatosis - ferritin decreasing - last check was  78 - down from 130.1 -LDL well controlled, last check was at 36, TSH in range with last check, no issues or complaints with medications  Recommendations/Changes made from today's visit: -Recommending no changes to medications at this time, encouraged patient to continue closely monitoring blood sugars, patient plans to discuss possible restart of an insulin pump with Dr. Chalmers Cater at next visit, for now will continue current doses of insulin, aware of signs/symptoms of hypoglycemia and how to appropriately manage    Subjective: Logan [name] is an 70 y.o. year old male who is a primary patient of Janith Lima, MD.  The CCM team was consulted for assistance with disease management and care coordination needs.    Engaged with patient by telephone for initial visit in response to provider referral for pharmacy case management and/or care coordination services.   Consent to Services:  The patient was given the following information about Chronic Care Management services today, agreed to services, and gave verbal consent: 1. CCM service includes personalized support from designated clinical staff supervised by the primary care provider, including individualized  plan of care and coordination with other care providers 2. 24/7 contact phone numbers for assistance for urgent and routine care needs. 3. Service will only be billed when office clinical staff spend 20 minutes or more in a month to coordinate care. 4. Only one practitioner may furnish and bill the service in a calendar month. 5.The patient may stop CCM services at any time (effective at the end of the month) by phone call to the office staff. 6. The patient will be responsible for cost sharing (co-pay) of up to 20% of the service fee (after annual deductible is met). Patient agreed to services and consent obtained.  Patient Care Team: Janith Lima, MD as PCP - General (Internal Medicine) Fay Records, MD as PCP - Cardiology (Cardiology) Pleasant, Eppie Gibson, RN as [place] Management Arham Symmonds, Darnelle Maffucci, [facility] as Pharmacist (Pharmacist)  Recent office visits:  11/30/20 - Janith Lima MD - Seen for general follow up -  Shingrix vaccine administered and start on Gvoke HypoPen 2-Pack - Referral to Gastroenterology - Labs ordered - Follow up in 4 months 10/12/20 Janith Lima MD - Seen for abdominal pain - Labs ordered - discontinued Gvoke HypoPen 2-Pack - follow up in 3 months  09/21/20 Biagio Borg MD - Seen for LUQ pain - Increased avapro to 300 daily, Stop bystolic - Referral for CT scan to rule out splenic infarct - Labs ordered - follow up in 1-2 wks  to check b/p and also a blood test to check kidneys and potassium  08/18/20 Janith Lima MD - Seen for hypertension and hypothyroidism follow up - Start nebivolol (  BYSTOLIC) 5 MG tablet;  Take 1 tablet (5 mg total) by mouth daily, and start Gvoke HypoPen 2-Pack - Labs ordered - Follow up in 3 months   Recent consult visits:  01/13/21 Narda Rutherford MD - Oncology - Seen for 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    Hospital visits:  None in previous 6 months  Objective:  Lab Results  Component Value Date   CREATININE 1.29 (H) 01/13/2021   BUN 18 01/13/2021   GFR 59.25 (L) 10/12/2020   GFRNONAA >60 01/13/2021   GFRAA 71 08/31/2016   NA 140 01/13/2021   K 4.2 01/13/2021   CALCIUM 9.5 01/13/2021   CO2 29 01/13/2021   GLUCOSE 72 01/13/2021    Lab Results  Component Value Date/Time   HGBA1C 7.8 (H) 10/12/2020 02:20 PM   HGBA1C 7.3 (H) 06/17/2020 08:30 AM   HGBA1C 9.7 08/30/2017 12:00 AM   HGBA1C 8.3 11/18/2015 12:00 AM   GFR 59.25 (L) 10/12/2020 02:20 PM   GFR 61.69 08/18/2020 08:36 AM   MICROALBUR 2.2 (H) 06/17/2020 08:30 AM   MICROALBUR 0.7 01/21/2019 08:36 AM    Last diabetic Eye exam:  Lab Results  Component Value Date/Time   HMDIABEYEEXA Retinopathy (A) 04/20/2020 12:00 AM    Last diabetic Foot exam:  Lab Results  Component Value Date/Time   HMDIABFOOTEX done 11/23/2017 12:00 AM     Lab Results  Component Value Date   CHOL 94 06/17/2020   HDL 47.60 06/17/2020   LDLCALC 36 06/17/2020   TRIG 54.0 06/17/2020   CHOLHDL 2 06/17/2020    Hepatic Function Latest Ref Rng & Units 01/13/2021 12/09/2020 12/09/2020  Total Protein 6.5 - 8.1 g/dL 7.4 - 7.6  Albumin 3.5 - 5.0 g/dL 4.2 - 4.5  AST 15 - 41 U/L 28 - 22  ALT 0 - 44 U/L 32 - 26  Alk Phosphatase 38 - 126 U/L 165(H) 195(H) 164(H)  Total Bilirubin 0.3 - 1.2 mg/dL 1.0 - 1.1  Bilirubin, Direct 0.0 - 0.3 mg/dL - - 0.2    Lab Results  Component Value Date/Time   TSH 1.80 08/18/2020 08:36 AM   TSH 0.67 05/07/2020 12:00 AM   TSH 11.12 (H) 10/03/2019 08:55 AM   FREET4 0.41 (L) 04/24/2015 09:37 AM    CBC Latest Ref Rng & Units 02/02/2021 01/19/2021 01/13/2021  WBC 4.0 - 10.5 K/uL 9.7 7.0 7.6  Hemoglobin 13.0 - 17.0 g/dL 13.5 14.6 15.4  Hematocrit 39.0 - 52.0 % 39.5 41.8 44.8  Platelets 150 - 400 K/uL 214 196 210     No results found for: VD25OH  Clinical ASCVD: No  The ASCVD Risk score Mikey Bussing DC Jr., et al., 2013) failed to calculate for the following reasons:   The valid total cholesterol range is 130 to 320 mg/dL    Depression screen [facility] 2/9 03/19/2020 01/23/2019 07/03/2018  Decreased Interest 0 0 0  Down, Depressed, Hopeless 0 0 0  PHQ - 2 Score 0 0 0    Social History   Tobacco Use  Smoking Status Never  Smokeless Tobacco Never   BP Readings from Last 3 Encounters:  02/02/21 129/66  01/19/21 (!) 147/78  01/13/21 (!) 157/61   Pulse Readings from Last 3 Encounters:  02/02/21 68  01/19/21 62  01/13/21 74   Wt Readings from Last 3 Encounters:  01/13/21 173 lb 12.8 oz (78.8 kg)  12/09/20 172 lb (78 kg)  11/30/20 171 lb (77.6 kg)   BMI Readings from Last 3 Encounters:  01/13/21 22.31 kg/m  12/09/20 22.08 kg/m  11/30/20 21.96 kg/m    Assessment/Interventions: Review of patient past medical history, allergies, medications, health status, including review of consultants reports, laboratory and other test data, was performed as part of comprehensive evaluation and provision of chronic care management services.   SDOH:  (Social Determinants of Health) assessments and interventions performed: Yes  SDOH Screenings   Alcohol Screen: Not on file  Depression (PHQ2-9): Low Risk    PHQ-2 Score: 0  Financial Resource Strain: Low Risk    Difficulty of Paying Living Expenses: Not hard at all  Food Insecurity: No Food Insecurity   Worried About Charity fundraiser in the Last Year: Never true   Ran Out of Food in the Last Year: Never true  Housing: Not on file  Physical Activity: Sufficiently Active   Days of Exercise per Week: 3 days   Minutes of Exercise per Session: 60 min  Social Connections: Not on file  Stress: Not on file  Tobacco Use: Low Risk    Smoking Tobacco Use: Never   Smokeless Tobacco Use: Never  Transportation Needs: No Transportation Needs   Lack of  Transportation (Medical): No   Lack of Transportation (Non-Medical): No    CCM Care Plan  No Known Allergies  Medications Reviewed Today     Reviewed by Tomasa Blase, [facility] (Pharmacist) on 02/09/21 at 1138  Med List Status: <None>   Medication Order Taking? Sig Documenting Provider Last Dose Status Informant  amLODipine (NORVASC) 10 MG tablet [id number] Yes Take 1 tablet by mouth once daily Janith Lima, MD Taking Active   aspirin 81 MG tablet [id number] Yes Take 81 mg by mouth 2 (two) times daily. [provider] Taking Active Self           Med Note Allene Dillon Aug 31, 2016  3:45 PM)    Continuous Blood Gluc Receiver (FREESTYLE LIBRE [place]) [place]VI [id number] Yes  [provider] Taking Active   Continuous Blood Gluc Sensor (FREESTYLE LIBRE [place]) [place] [id number] Yes  [provider] Taking Active   insulin glargine (LANTUS) 100 UNIT/ML injection [id number] Yes INJECT 20 UNITS SUBCUTANEOUSLY ONCE DAILY AT BEDTIME Janith Lima, MD Taking Active            Med Note Verlin Grills   Tue Jan 19, 2021 10:24 AM) Per patient he takes 12units or less  insulin NPH-regular Human (NOVOLIN 70/30) (70-30) 100 UNIT/ML injection [id number] Yes Inject 20 Units into the skin 2 (two) times daily with a meal.  Patient taking differently: Inject 18 Units into the skin 2 (two) times daily with a meal.   Janith Lima, MD Taking Active            Med Note Delice Bison, [place] Feb 09, 2021 11:23 AM)    irbesartan (AVAPRO) 300 MG tablet [id number] Yes Take 1 tablet (300 mg total) by mouth daily. Biagio Borg, MD Taking Active   levothyroxine (SYNTHROID) 100 MCG tablet [id number] Yes Take 1 tablet by mouth once daily Janith Lima, MD Taking Active   PEG-KCl-NaCl-NaSulf-Na Asc-C (PLENVU) 140 g SOLR [id number] No Take 1 kit by mouth as directed.  Use coupon: BIN: [id number] [facility]: CNRX Group: [id number] ID: [id number]  Patient not taking: No sig reported    Pyrtle, Lajuan Lines, MD Not Taking Active   rosuvastatin (CRESTOR) 10 MG tablet [id number] Yes Take 1 tablet by mouth once daily Fay Records, MD Taking Active   triamterene-hydrochlorothiazide [facility]) 37.5-25 MG tablet [id number] Yes Take 1/2 (one-half) tablet by mouth once daily Fay Records, MD Taking Active             Patient Active Problem List   Diagnosis Date Noted   Polyp of colon 66/59/9357   Systolic murmur 01/77/9390   Need for shingles vaccine 11/30/2020   Aortic atherosclerosis ([place]) 10/09/2020   Diabetic retinopathy ([place]) 09/21/2020   Pure hypercholesterolemia 09/21/2020   Chronic renal impairment, stage 2 (mild) 07/03/2018   Nonspecific abnormal electrocardiogram (ECG) (EKG) 12/30/2015   Chronic foot ulcer with fat layer exposed ([place]) 01/19/2015   Diabetic neuropathy ([place]) 01/19/2015   [place]ammertoe 01/19/2015   Diabetic ulcer of both feet associated with type 2 diabetes mellitus ([place]) 01/19/2015   BPH (benign prostatic hyperplasia) 08/20/2014   Nuclear sclerosis of both eyes 04/16/2014   Type 1 diabetes mellitus without complication ([place]) 30/02/2329   Hypothyroidism 01/08/2014   Hyperlipidemia LDL goal <70 05/05/2009   Type 1 diabetes mellitus without complications ([place]) 07/62/2633   Uncontrolled type 1 diabetes mellitus 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 ([place]) 11/01/2007   Hereditary hemochromatosis ([place]) 11/01/2007   Essential hypertension 11/01/2007   Coronary artery disease 11/01/2007    Immunization History  Administered Date(s) Administered   Influenza Split 05/10/2011, 03/20/2014   Influenza Whole 03/11/2010, 05/21/2012   Influenza, High Dose Seasonal PF 03/29/2016, 04/10/2017, 02/01/2018   Influenza-Unspecified 02/05/2015, 04/10/2017, 03/05/2020   PFIZER(Purple Top)SARS-COV-2 Vaccination 08/02/2019, 08/27/2019, 03/03/2020   Pneumococcal Conjugate-13 06/20/2017   Pneumococcal Polysaccharide-23 01/08/2014, 03/29/2016   Tdap  01/08/2014   Zoster, Live 04/10/2016    Conditions to be addressed/monitored:  Hypertension, Hyperlipidemia, Diabetes, and Hypothyroidism  Care Plan : CCM Care Plan  Updates made by Tomasa Blase, RPH since 02/09/2021 12:00 AM     Problem: HTN, DM1, HLD, Hypothyroidism   Priority: High  Onset Date: 02/09/2021     Long-Range Goal: Disease Management   Start Date: 02/09/2021  Expected End Date: 08/09/2021  This Visit's Progress: On track  Priority: High  Note:   Current Barriers:  Unable to independently monitor therapeutic efficacy  Pharmacist Clinical Goal(s):  Patient will achieve adherence to monitoring guidelines and medication adherence to achieve therapeutic efficacy maintain control of blood pressure, LDL, and blood sugars as evidenced by blood pressure and blood sugar logs / next lipid panel  through collaboration with PharmD and provider.   Interventions: 1:1 collaboration with Janith Lima, MD regarding development and update of comprehensive plan of care as evidenced by provider attestation and co-signature Inter-disciplinary care team collaboration (see longitudinal plan of care) Comprehensive medication review performed; medication list updated in electronic medical record  Hypertension (BP goal <130/80) -Controlled -Current treatment: Triamterene-HCTZ 59m-37.5mg - 1/2 tablet daily  Irbesartan 3058m- 1 tablet daily  Amlodipine 1071m 1 tablet daily -Medications previously tried: aliskiren, edarbi, clonidine, losartan, metoprolol, bystolic,   -Current home readings: averaging 128-130/~60-69 - SBP has been as high as 140, and DBP has been as low as 53 - notes fatigue when blood pressure is low , denies dizziness / syncope  -Current dietary habits: denies salting food, eats low sodium diet, notes that he does drink coffee in colder  weather -Current exercise habits: walks about 4-5 miles daily on average  -Denies hypotensive/hypertensive symptoms -Educated on BP  goals and benefits of medications for prevention of heart attack, stroke and kidney damage; Daily salt intake goal < 2300 mg; Exercise goal of 150 minutes per week; Importance of home blood pressure monitoring; Proper BP monitoring technique; Symptoms of hypotension and importance of maintaining adequate hydration; -Counseled to monitor BP at home once daily, document, and provide log at future appointments -Counseled on diet and exercise extensively Recommended to continue current medication  Hyperlipidemia: (LDL goal < 70) -Controlled Lab Results  Component Value Date   LDLCALC 36 06/17/2020  -Current treatment: Rosuvastatin 75m - 1 tablet daily  Aspirin 850m- 1 tablet twice daily  -Medications previously tried: simvastatin   -Current dietary patterns: reports to intake of low cholesterol foods, avoids fatty meats, mainly eating chicken, [place] fish, notes to eating a hamburger on occasion, eating more salad -Current exercise habits: walks about 4-5 miles daily on average  -Educated on Cholesterol goals;  Benefits of statin for ASCVD risk reduction; Importance of limiting foods high in cholesterol; Exercise goal of 150 minutes per week; -Counseled on diet and exercise extensively Recommended to continue current medication  Diabetes (A1c goal <7%) -Not ideally controlled - follows with Dr. BaChalmers CaterLab Results  Component Value Date   HGBA1C 7.8 (H) 10/12/2020  -Current medications: Lantus - using 8-10 units daily  Novolin 70/30 - 18 units twice daily  -Medications previously tried: n/a  -Current home glucose readings fasting glucose: AM - Averaging 65-99, after breakfast ~ 140 post prandial glucose: 180 late in the evening  -Reports hypoglycemic/hyperglycemic readings about once a week, denies symptoms when blood sugars are low  -Current meal patterns:  breakfast: 1/2 banana, fruit, chicken, tu[place]unch: left over, crackers, soup, tomatoes  dinner: chicken, [place] burger on rare occasion, salad snacks: peanuts, crackers drinks: water, Gatorade if out in the heat working and sweating  -Current exercise: walks about 4-5 miles daily on average  -Educated on A1c and blood sugar goals; Complications of diabetes including kidney damage, retinal damage, and cardiovascular disease; Exercise goal of 150 minutes per week; Benefits of weight loss; Proper insulin injection technique; Prevention and management of hypoglycemic episodes; Benefits of routine self-monitoring of blood sugar; Continuous glucose monitoring; -Counseled to check feet daily and get yearly eye exams -Counseled on diet and exercise extensively Recommended to continue current medication  Hypothyroidism (Goal: Maintenance of euthyroid levels) -Controlled Lab Results  Component Value Date   TSH 1.80 08/18/2020  -Current treatment  Levothyroxine 10081m- 1 tablet daily  -Medications previously tried: n/a  -Recommended to continue current medication  Patient Goals/Self-Care Activities Patient will:  - take medications as prescribed check glucose continuously with libre, document, and provide at future appointments check blood pressure daily, document, and provide at future appointments target a minimum of 150 minutes of moderate intensity exercise weekly  Follow Up Plan: Telephone follow up appointment with care management team member scheduled for: The patient has been provided with contact information for the care management team and has been advised to call with any health related questions or concerns.          Medication Assistance: None required.  Patient affirms current coverage meets needs.  [facility] pharmacy [facility]:  [place] [address] [place] ([place] [id number]: [phone]: [phone] pill box?  Yes Pt endorses 100% compliance  Care Plan and Follow Up Patient  Decision:  Patient agrees to Care Plan and Follow-up.  Plan: Telephone follow up appointment with care management team member scheduled for:  4 months and The patient has been provided with contact information for the care management team and has been advised to call with any health related questions or concerns.   Tomasa Blase, PharmD Clinical Pharmacist, [place]

## 2021-02-09 NOTE — Patient Instructions (Signed)
Visit Information   PATIENT GOALS:   Goals Addressed             This Visit's Progress    Track and Manage My Blood Pressure-Hypertension       Timeframe:  Long-Range Goal Priority:  High Start Date:  02/09/2021                           Expected End Date:  08/09/2021                     Follow Up Date 06/11/2021   - check blood pressure daily - choose a place to take my blood pressure (home, clinic or office, retail store) - write blood pressure results in a log or diary    Why is this important?   You won't feel high blood pressure, but it can still hurt your blood vessels.  High blood pressure can cause heart or kidney problems. It can also cause a stroke.  Making lifestyle changes like losing a little weight or eating less salt will help.  Checking your blood pressure at home and at different times of the day can help to control blood pressure.  If the doctor prescribes medicine remember to take it the way the doctor ordered.  Call the office if you cannot afford the medicine or if there are questions about it.           Consent to CCM Services: Mr. Veselka was given information about Chronic Care Management services including:  CCM service includes personalized support from designated clinical staff supervised by his physician, including individualized plan of care and coordination with other care providers 24/7 contact phone numbers for assistance for urgent and routine care needs. Service will only be billed when office clinical staff spend 20 minutes or more in a month to coordinate care. Only one practitioner may furnish and bill the service in a calendar month. The patient may stop CCM services at any time (effective at the end of the month) by phone call to the office staff. The patient will be responsible for cost sharing (co-pay) of up to 20% of the service fee (after annual deductible is met).  Patient agreed to services and verbal consent obtained.   Patient  verbalizes understanding of instructions provided today and agrees to view in [place].   Telephone follow up appointment with care management team member scheduled for: 4 months  The patient has been provided with contact information for the care management team and has been advised to call with any health related questions or concerns.   Tomasa Blase, PharmD Clinical Pharmacist, [place]    CLINICAL CARE PLAN:  Patient Care Plan: CCM Care Plan     Problem Identified: HTN, DM1, HLD, Hypothyroidism   Priority: High  Onset Date: 02/09/2021     Long-Range Goal: Disease Management   Start Date: 02/09/2021  Expected End Date: 08/09/2021  This Visit's Progress: On track  Priority: High  Note:   Current Barriers:  Unable to independently monitor therapeutic efficacy  Pharmacist Clinical Goal(s):  Patient will achieve adherence to monitoring guidelines and medication adherence to achieve therapeutic efficacy maintain control of blood pressure, LDL, and blood sugars as evidenced by blood pressure and blood sugar logs / next lipid panel  through collaboration with PharmD and provider.   Interventions: 1:1 collaboration with Janith Lima, MD regarding development and update of comprehensive plan of care  as evidenced by provider attestation and co-signature Inter-disciplinary care team collaboration (see longitudinal plan of care) Comprehensive medication review performed; medication list updated in electronic medical record  Hypertension (BP goal <130/80) -Controlled -Current treatment: Triamterene-HCTZ 67m-37.5mg - 1/2 tablet daily  Irbesartan 3027m- 1 tablet daily  Amlodipine 1062m 1 tablet daily -Medications previously tried: aliskiren, edarbi, clonidine, losartan, metoprolol, bystolic,   -Current home readings: averaging 128-130/~60-69 - SBP has been as high as 140, and DBP has been as low as 53 - notes fatigue when blood pressure is low , denies dizziness /  syncope  -Current dietary habits: denies salting food, eats low sodium diet, notes that he does drink coffee in colder weather -Current exercise habits: walks about 4-5 miles daily on average  -Denies hypotensive/hypertensive symptoms -Educated on BP goals and benefits of medications for prevention of heart attack, stroke and kidney damage; Daily salt intake goal < 2300 mg; Exercise goal of 150 minutes per week; Importance of home blood pressure monitoring; Proper BP monitoring technique; Symptoms of hypotension and importance of maintaining adequate hydration; -Counseled to monitor BP at home once daily, document, and provide log at future appointments -Counseled on diet and exercise extensively Recommended to continue current medication  Hyperlipidemia: (LDL goal < 70) -Controlled Lab Results  Component Value Date   LDLCALC 36 06/17/2020  -Current treatment: Rosuvastatin 23m23m1 tablet daily  Aspirin 81mg52m tablet twice daily  -Medications previously tried: simvastatin   -Current dietary patterns: reports to intake of low cholesterol foods, avoids fatty meats, mainly eating chicken, [place] fish, notes to eating a hamburger on occasion, eating more salad -Current exercise habits: walks about 4-5 miles daily on average  -Educated on Cholesterol goals;  Benefits of statin for ASCVD risk reduction; Importance of limiting foods high in cholesterol; Exercise goal of 150 minutes per week; -Counseled on diet and exercise extensively Recommended to continue current medication  Diabetes (A1c goal <7%) -Not ideally controlled - follows with Dr. BalanChalmers Cater Results  Component Value Date   HGBA1C 7.8 (H) 10/12/2020  -Current medications: Lantus - using 8-10 units daily  Novolin 70/30 - 18 units twice daily  -Medications previously tried: n/a  -Current home glucose readings fasting glucose: AM - Averaging 65-99, after breakfast ~ 140 post prandial glucose: 180 late in the  evening  -Reports hypoglycemic/hyperglycemic readings about once a week, denies symptoms when blood sugars are low  -Current meal patterns:  breakfast: 1/2 banana, fruit, chicken, [place]: left over, crackers, soup, tomatoes  dinner: chicken, [place] on rare occasion, salad snacks: peanuts, crackers drinks: water, Gatorade if out in the heat working and sweating  -Current exercise: walks about 4-5 miles daily on average  -Educated on A1c and blood sugar goals; Complications of diabetes including kidney damage, retinal damage, and cardiovascular disease; Exercise goal of 150 minutes per week; Benefits of weight loss; Proper insulin injection technique; Prevention and management of hypoglycemic episodes; Benefits of routine self-monitoring of blood sugar; Continuous glucose monitoring; -Counseled to check feet daily and get yearly eye exams -Counseled on diet and exercise extensively Recommended to continue current medication  Hypothyroidism (Goal: Maintenance of euthyroid levels) -Controlled Lab Results  Component Value Date   TSH 1.80 08/18/2020  -Current treatment  Levothyroxine 100mcg56m tablet daily  -Medications previously tried: n/a  -Recommended to continue current medication  Patient Goals/Self-Care Activities Patient will:  - take medications as prescribed check glucose continuously with libre,Elenor Legatoment, and provide at  future appointments check blood pressure daily, document, and provide at future appointments target a minimum of 150 minutes of moderate intensity exercise weekly  Follow Up Plan: Telephone follow up appointment with care management team member scheduled for: The patient has been provided with contact information for the care management team and has been advised to call with any health related questions or concerns.

## 2021-02-16 ENCOUNTER — Inpatient Hospital Stay: Payer: Medicare Other

## 2021-02-16 ENCOUNTER — Other Ambulatory Visit: Payer: Self-pay

## 2021-02-16 ENCOUNTER — Inpatient Hospital Stay: Payer: Medicare Other | Attending: Hematology and Oncology

## 2021-02-16 DIAGNOSIS — E11319 Type 2 diabetes mellitus with unspecified diabetic retinopathy without macular edema: Secondary | ICD-10-CM | POA: Diagnosis not present

## 2021-02-16 DIAGNOSIS — Z86010 Personal history of colonic polyps: Secondary | ICD-10-CM | POA: Diagnosis not present

## 2021-02-16 DIAGNOSIS — Z79899 Other long term (current) drug therapy: Secondary | ICD-10-CM | POA: Diagnosis not present

## 2021-02-16 DIAGNOSIS — I10 Essential (primary) hypertension: Secondary | ICD-10-CM | POA: Insufficient documentation

## 2021-02-16 DIAGNOSIS — E1136 Type 2 diabetes mellitus with diabetic cataract: Secondary | ICD-10-CM | POA: Insufficient documentation

## 2021-02-16 DIAGNOSIS — I2510 Atherosclerotic heart disease of native coronary artery without angina pectoris: Secondary | ICD-10-CM | POA: Insufficient documentation

## 2021-02-16 DIAGNOSIS — Z7982 Long term (current) use of aspirin: Secondary | ICD-10-CM | POA: Insufficient documentation

## 2021-02-16 DIAGNOSIS — I700 Atherosclerosis of aorta: Secondary | ICD-10-CM | POA: Diagnosis not present

## 2021-02-16 DIAGNOSIS — E785 Hyperlipidemia, unspecified: Secondary | ICD-10-CM | POA: Insufficient documentation

## 2021-02-16 DIAGNOSIS — Z794 Long term (current) use of insulin: Secondary | ICD-10-CM | POA: Insufficient documentation

## 2021-02-16 LAB — CBC WITH DIFFERENTIAL (CANCER CENTER ONLY)
Abs Immature Granulocytes: 0.01 10*3/uL (ref 0.00–0.07)
Basophils Absolute: 0.1 10*3/uL (ref 0.0–0.1)
Basophils Relative: 2 %
Eosinophils Absolute: 0.7 10*3/uL — ABNORMAL HIGH (ref 0.0–0.5)
Eosinophils Relative: 10 %
HCT: 39.7 % (ref 39.0–52.0)
Hemoglobin: 13.6 g/dL (ref 13.0–17.0)
Immature Granulocytes: 0 %
Lymphocytes Relative: 32 %
Lymphs Abs: 2.2 10*3/uL (ref 0.7–4.0)
MCH: 32.1 pg (ref 26.0–34.0)
MCHC: 34.3 g/dL (ref 30.0–36.0)
MCV: 93.6 fL (ref 80.0–100.0)
Monocytes Absolute: 0.8 10*3/uL (ref 0.1–1.0)
Monocytes Relative: 11 %
Neutro Abs: 3 10*3/uL (ref 1.7–7.7)
Neutrophils Relative %: 45 %
Platelet Count: 214 10*3/uL (ref 150–400)
RBC: 4.24 MIL/uL (ref 4.22–5.81)
RDW: 14.1 % (ref 11.5–15.5)
WBC Count: 6.8 10*3/uL (ref 4.0–10.5)
nRBC: 0 % (ref 0.0–0.2)

## 2021-02-16 LAB — FERRITIN: Ferritin: 53 ng/mL (ref 24–336)

## 2021-02-16 NOTE — Progress Notes (Signed)
Phlebotomy via R antecubital with phlebotomy set.  Started at 1510 & finished at 1556.  Obtained 597 grams which included fluid already in phlebotomy set.  Tol. Well.  Refreshments offered.  Pt eating peanut butter crackers coming in but refused anything else.  Encouraged to push fluids today.  Pressure dressing to IV site.

## 2021-02-16 NOTE — Patient Instructions (Signed)
Therapeutic Phlebotomy Therapeutic phlebotomy is the planned removal of blood from a person's body for the purpose of treating a medical condition. The procedure is similar to donating blood. Usually, about a pint (470 mL, or 0.47 L) of blood is removed. The average adult has 9-12 pints (4.3-5.7 L) of blood in the body. Therapeutic phlebotomy may be used to treat the following medical conditions: Hemochromatosis. This is a condition in which the blood contains too much iron. Polycythemia vera. This is a condition in which the blood contains too many red blood cells. Porphyria cutanea tarda. This is a disease in which an important part of hemoglobin is not made properly. It results in the buildup of abnormal amounts of porphyrins in the body. Sickle cell disease. This is a condition in which the red blood cells form an abnormal crescent shape rather than a round shape. Tell a health care provider about: Any allergies you have. All medicines you are taking, including vitamins, herbs, eye drops, creams, and over-the-counter medicines. Any problems you or family members have had with anesthetic medicines. Any blood disorders you have. Any surgeries you have had. Any medical conditions you have. Whether you are pregnant or may be pregnant. What are the risks? Generally, this is a safe procedure. However, problems may occur, including: Nausea or light-headedness. Low blood pressure (hypotension). Soreness, bleeding, swelling, or bruising at the needle insertion site. Infection. What happens before the procedure? Follow instructions from your health care provider about eating or drinking restrictions. Ask your health care provider about: Changing or stopping your regular medicines. This is especially important if you are taking diabetes medicines or blood thinners (anticoagulants). Taking medicines such as aspirin and ibuprofen. These medicines can thin your blood. Do not take these medicines  unless your health care provider tells you to take them. Taking over-the-counter medicines, vitamins, herbs, and supplements. Wear clothing with sleeves that can be raised above the elbow. Plan to have someone take you home from the hospital or clinic. You may have a blood sample taken. Your blood pressure, pulse rate, and breathing rate will be measured. What happens during the procedure?  To lower your risk of infection: Your health care team will wash or sanitize their hands. Your skin will be cleaned with an antiseptic. You may be given a medicine to numb the area (local anesthetic). A tourniquet will be placed on your arm. A needle will be inserted into one of your veins. Tubing and a collection bag will be attached to that needle. Blood will flow through the needle and tubing into the collection bag. The collection bag will be placed lower than your arm to allow gravity to help the flow of blood into the bag. You may be asked to open and close your hand slowly and continually during the entire collection. After the specified amount of blood has been removed from your body, the collection bag and tubing will be clamped. The needle will be removed from your vein. Pressure will be held on the site of the needle insertion to stop the bleeding. A bandage (dressing) will be placed over the needle insertion site. The procedure may vary among health care providers and hospitals. What happens after the procedure? Your blood pressure, pulse rate, and breathing rate will be measured after the procedure. You will be encouraged to drink fluids. Your recovery will be assessed and monitored. You can return to your normal activities as told by your health care provider. Summary Therapeutic phlebotomy is the planned removal   of blood from a person's body for the purpose of treating a medical condition. Therapeutic phlebotomy may be used to treat hemochromatosis, polycythemia vera, porphyria cutanea  tarda, or sickle cell disease. In the procedure, a needle is inserted and about a pint (470 mL, or 0.47 L) of blood is removed. The average adult has 9-12 pints (4.3-5.7 L) of blood in the body. This is generally a safe procedure, but it can sometimes cause problems such as nausea, light-headedness, or low blood pressure (hypotension). This information is not intended to replace advice given to you by your health care provider. Make sure you discuss any questions you have with your health care provider. Document Revised: 09/01/2020 Document Reviewed: 06/08/2017 Elsevier Patient Education  2022 Elsevier Inc.  

## 2021-02-24 ENCOUNTER — Encounter: Payer: Self-pay | Admitting: Hematology and Oncology

## 2021-03-03 ENCOUNTER — Inpatient Hospital Stay: Payer: Medicare Other

## 2021-03-03 ENCOUNTER — Inpatient Hospital Stay (HOSPITAL_BASED_OUTPATIENT_CLINIC_OR_DEPARTMENT_OTHER): Payer: Medicare Other | Admitting: Hematology and Oncology

## 2021-03-03 ENCOUNTER — Other Ambulatory Visit: Payer: Self-pay

## 2021-03-03 ENCOUNTER — Telehealth: Payer: Self-pay | Admitting: Internal Medicine

## 2021-03-03 DIAGNOSIS — I700 Atherosclerosis of aorta: Secondary | ICD-10-CM | POA: Diagnosis not present

## 2021-03-03 DIAGNOSIS — I2510 Atherosclerotic heart disease of native coronary artery without angina pectoris: Secondary | ICD-10-CM | POA: Diagnosis not present

## 2021-03-03 DIAGNOSIS — E11319 Type 2 diabetes mellitus with unspecified diabetic retinopathy without macular edema: Secondary | ICD-10-CM | POA: Diagnosis not present

## 2021-03-03 DIAGNOSIS — E1136 Type 2 diabetes mellitus with diabetic cataract: Secondary | ICD-10-CM | POA: Diagnosis not present

## 2021-03-03 DIAGNOSIS — Z79899 Other long term (current) drug therapy: Secondary | ICD-10-CM | POA: Diagnosis not present

## 2021-03-03 LAB — CBC WITH DIFFERENTIAL (CANCER CENTER ONLY)
Abs Immature Granulocytes: 0.01 10*3/uL (ref 0.00–0.07)
Basophils Absolute: 0.1 10*3/uL (ref 0.0–0.1)
Basophils Relative: 1 %
Eosinophils Absolute: 0.7 10*3/uL — ABNORMAL HIGH (ref 0.0–0.5)
Eosinophils Relative: 10 %
HCT: 38.4 % — ABNORMAL LOW (ref 39.0–52.0)
Hemoglobin: 13.3 g/dL (ref 13.0–17.0)
Immature Granulocytes: 0 %
Lymphocytes Relative: 27 %
Lymphs Abs: 2 10*3/uL (ref 0.7–4.0)
MCH: 32.4 pg (ref 26.0–34.0)
MCHC: 34.6 g/dL (ref 30.0–36.0)
MCV: 93.7 fL (ref 80.0–100.0)
Monocytes Absolute: 0.7 10*3/uL (ref 0.1–1.0)
Monocytes Relative: 10 %
Neutro Abs: 3.8 10*3/uL (ref 1.7–7.7)
Neutrophils Relative %: 52 %
Platelet Count: 227 10*3/uL (ref 150–400)
RBC: 4.1 MIL/uL — ABNORMAL LOW (ref 4.22–5.81)
RDW: 14 % (ref 11.5–15.5)
WBC Count: 7.4 10*3/uL (ref 4.0–10.5)
nRBC: 0 % (ref 0.0–0.2)

## 2021-03-03 LAB — FERRITIN: Ferritin: 43 ng/mL (ref 24–336)

## 2021-03-03 NOTE — Telephone Encounter (Signed)
Hey Dr. Hilarie Fredrickson,   Patient called in to cancel EGD/Colon 10/3 due to not having a person to stay with him during the procedures. He states he will call back to reschedule in the spring.   Thank you

## 2021-03-03 NOTE — Patient Instructions (Signed)

## 2021-03-03 NOTE — Progress Notes (Signed)
Phlebotomy for 1 unit of blood = 600 grams which included @ 90 ml of fluid already in phlebotomy bag.  Pt tol. Well. Refreshments offered but pt declined & states he had a pizza @ 1 hr before & plans to eat & drink well today.  IV site unremarkable.  D/c home.

## 2021-03-03 NOTE — Progress Notes (Signed)
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 Genetic testing shows patient is homozygous for C282Y 01/13/2021: establish care with Dr. Lorenso Courier  01/19/2021: Ferritin 129, Hgb 14.6 02/02/2021: Ferritin 78, Hgb 13.5 02/16/2021: Ferritin 53, Hgb 13.6  Interval History:  Logan [name] 70 y.o. male with medical history significant for hereditary hemachromatosis presents for a follow up visit. The patient's last visit was on 01/13/2021 at which time he established care. In the interim since the last visit he has undergone q 2 weekly phlebotomy.  On exam today Mr. Mcbroom reports he is starting to feel the impact of the lower legs.  He has had 3 episodes of phlebotomy and currently has a hemoglobin of 13.3.  At last check his ferritin was 53.  He notes that he is having a drop in his energy but denies any shortness of breath or chest pain.  He also notes no other overt signs of bleeding.  He currently denies any fevers, chills, sweats, nausea, vomiting or diarrhea.  Other than "lagging" he feels he is doing well.  A full 10 point ROS is listed below.  MEDICAL HISTORY:  Past Medical History:  Diagnosis Date   Aortic atherosclerosis (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   Procedure: Right Great Toe Amputation;  Surgeon: Newt Minion, MD;  Location: [facility];  Service: Orthopedics;  Laterality: Right;  Right Great Toe Amputation   INNER EAR SURGERY     right    SOCIAL HISTORY: Social History   Socioeconomic History   Marital status: Widowed    Spouse name: Not on file   Number of children: 1   Years of education: 67   Highest education level: Not on file  Occupational History   Occupation: retired    Fish farm manager: [employer]  Tobacco Use   Smoking status: Never   Smokeless tobacco: Never  Vaping Use   Vaping Use: Never used  Substance and Sexual Activity   Alcohol use: Yes    Comment: occ beer but very rare   Drug use: No   Sexual activity: Yes    Partners: Female  Other Topics Concern   Not on file  Social History Narrative   HSG. Married - '77 - '04. 1 son '80. Work - semi-retired. Has a girlfriend.   Social Determinants of Health   Financial Resource Strain: Low Risk    Difficulty of Paying Living Expenses: Not hard at all  Food Insecurity: No Food Insecurity   Worried About Charity fundraiser  in the Last Year: Never true   [place] in the Last Year: Never true  Transportation Needs: No Transportation Needs   Lack of Transportation (Medical): No   Lack of Transportation (Non-Medical): No  Physical Activity: Sufficiently Active   Days of Exercise per Week: 3 days   Minutes of Exercise per Session: 60 min  Stress: Not on file  Social Connections: Not on file  Intimate Partner Violence: Not At Risk   Fear of Current or Ex-Partner: No   Emotionally Abused: No   Physically Abused: No   Sexually Abused: No    FAMILY HISTORY: Family History  Problem Relation Age of Onset   Asthma Mother     Heart failure Mother    Emphysema Mother    Alcohol abuse Father    Cancer Father        prostate   Stroke Father    Colon cancer Maternal Uncle        dx'd 49   Heart disease Neg Hx    Rectal cancer Neg Hx    Stomach cancer Neg Hx     ALLERGIES:  has No Known Allergies.  MEDICATIONS:  Current Outpatient Medications  Medication Sig Dispense Refill   amLODipine (NORVASC) 10 MG tablet Take 1 tablet by mouth once daily 90 tablet 1   aspirin 81 MG tablet Take 81 mg by mouth 2 (two) times daily.     Continuous Blood Gluc Receiver (FREESTYLE LIBRE 14 DAY READER) DEVI      Continuous Blood Gluc Sensor (FREESTYLE LIBRE 14 DAY SENSOR) MISC      insulin glargine (LANTUS) 100 UNIT/ML injection INJECT 20 UNITS SUBCUTANEOUSLY ONCE DAILY AT BEDTIME 30 mL 0   insulin NPH-regular Human (NOVOLIN 70/30) (70-30) 100 UNIT/ML injection Inject 20 Units into the skin 2 (two) times daily with a meal. (Patient taking differently: Inject 18 Units into the skin 2 (two) times daily with a meal.) 30 mL 11   irbesartan (AVAPRO) 300 MG tablet Take 1 tablet (300 mg total) by mouth daily. 90 tablet 3   levothyroxine (SYNTHROID) 100 MCG tablet Take 1 tablet by mouth once daily 90 tablet 0   PEG-KCl-NaCl-NaSulf-Na Asc-C (PLENVU) 140 g SOLR Take 1 kit by mouth as directed. Use coupon: BIN: [id number] PNC: CNRX Group: [id number] ID: [id number] (Patient not taking: No sig reported) 1 each 0   rosuvastatin (CRESTOR) 10 MG tablet Take 1 tablet by mouth once daily 90 tablet 2   triamterene-hydrochlorothiazide (MAXZIDE-25) 37.5-25 MG tablet Take 1/2 (one-half) tablet by mouth once daily 45 tablet 2   No current facility-administered medications for this visit.    REVIEW OF SYSTEMS:   Constitutional: ( - ) fevers, ( - )  chills , ( - ) night sweats Eyes: ( - ) blurriness of vision, ( - ) double vision, ( - ) watery eyes Ears, nose, mouth, throat, and face: ( - ) mucositis, ( - ) sore throat Respiratory: ( - ) cough, ( - )  dyspnea, ( - ) wheezes Cardiovascular: ( - ) palpitation, ( - ) chest discomfort, ( - ) lower extremity swelling Gastrointestinal:  ( - ) nausea, ( - ) heartburn, ( - ) change in bowel habits Skin: ( - ) abnormal skin rashes Lymphatics: ( - ) new lymphadenopathy, ( - ) easy bruising Neurological: ( - ) numbness, ( - ) tingling, ( - ) new weaknesses Behavioral/Psych: ( - ) mood change, ( - ) new changes  All other  systems were reviewed with the patient and are negative.  PHYSICAL EXAMINATION: ECOG PERFORMANCE STATUS: 0 - Asymptomatic  Vitals:   03/03/21 1437  BP: (!) 163/69  Pulse: 76  Resp: 18  Temp: 98 F (36.7 C)  SpO2: 100%   Filed Weights   03/03/21 1437  Weight: 173 lb 14.4 oz (78.9 kg)    GENERAL: Well-appearing elderly Caucasian male, alert, no distress and comfortable SKIN: skin color, texture, turgor are normal, no rashes or significant lesions EYES: conjunctiva are pink and non-injected, sclera clear LUNGS: clear to auscultation and percussion with normal breathing effort HEART: regular rate & rhythm and no murmurs and no lower extremity edema. Musculoskeletal: no cyanosis of digits and no clubbing  PSYCH: alert & oriented x 3, fluent speech NEURO: no focal motor/sensory deficits  LABORATORY DATA:  I have reviewed the data as listed CBC Latest Ref Rng & Units 03/03/2021 02/16/2021 02/02/2021  WBC 4.0 - 10.5 K/uL 7.4 6.8 9.7  Hemoglobin 13.0 - 17.0 g/dL 13.3 13.6 13.5  Hematocrit 39.0 - 52.0 % 38.4(L) 39.7 39.5  Platelets 150 - 400 K/uL 227 214 214    CMP Latest Ref Rng & Units 01/13/2021 12/09/2020 12/09/2020  Glucose 70 - 99 mg/dL 72 - -  BUN 8 - 23 mg/dL 18 - -  Creatinine 0.61 - 1.24 mg/dL 1.29(H) - -  Sodium 135 - 145 mmol/L 140 - -  Potassium 3.5 - 5.1 mmol/L 4.2 - -  Chloride 98 - 111 mmol/L 103 - -  CO2 22 - 32 mmol/L 29 - -  Calcium 8.9 - 10.3 mg/dL 9.5 - -  Total Protein 6.5 - 8.1 g/dL 7.4 - 7.6  Total Bilirubin 0.3 - 1.2 mg/dL 1.0 - 1.1  Alkaline  Phos 38 - 126 U/L 165(H) 195(H) 164(H)  AST 15 - 41 U/L 28 - 22  ALT 0 - 44 U/L 32 - 26   RADIOGRAPHIC STUDIES: I have personally reviewed the radiological images as listed and agreed with the findings in the report. No results found.  ASSESSMENT & PLAN DUSTINE STICKLER 70 y.o. male with medical history significant for hereditary hemachromatosis presents for a follow up visit.  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  The patient was previously treated with phlebotomy when he was previously diagnosed by the GI department.  Due to his recent elevations in ferritin he was referred to hematology in order to manage his disorder.  Our recommendation would be for a goal ferritin of less than 50.  We will achieve this with phlebotomies performed every 2 weeks until he reaches target.  We will plan to start treatments next week.  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 --continue phlebotomy every 2 weeks until patient reaches target ferritin --today will proceed with phlebotomy as he was slightly over target at a ferritin of 53 at last check. --RTC in 6 weeks for labs and 3 months for repeat clinic visit  No orders of the defined types were placed in this encounter.   All questions were answered. The patient knows to call the clinic with any problems, questions or concerns.  A total of more than 30 minutes were spent on this encounter with face-to-face time and non-face-to-face time, including preparing to see the patient, ordering tests and/or  medications, counseling the patient and coordination of care as outlined above.   Ledell Peoples, MD Department of Hematology/Oncology [place] at [facility] Phone: [phone] Pager: [phone] Email: [email or web address]  03/07/2021 5:46 PM

## 2021-03-05 DIAGNOSIS — I10 Essential (primary) hypertension: Secondary | ICD-10-CM | POA: Diagnosis not present

## 2021-03-05 DIAGNOSIS — E039 Hypothyroidism, unspecified: Secondary | ICD-10-CM

## 2021-03-05 DIAGNOSIS — E785 Hyperlipidemia, unspecified: Secondary | ICD-10-CM

## 2021-03-05 DIAGNOSIS — E109 Type 1 diabetes mellitus without complications: Secondary | ICD-10-CM | POA: Diagnosis not present

## 2021-03-07 ENCOUNTER — Encounter: Payer: Self-pay | Admitting: Hematology and Oncology

## 2021-03-08 ENCOUNTER — Encounter: Payer: Medicare Other | Admitting: Internal Medicine

## 2021-03-09 DIAGNOSIS — Z23 Encounter for immunization: Secondary | ICD-10-CM | POA: Diagnosis not present

## 2021-03-24 ENCOUNTER — Telehealth: Payer: Self-pay | Admitting: Hematology and Oncology

## 2021-03-24 NOTE — Telephone Encounter (Signed)
Sch per 9/29 los, pt aware 

## 2021-04-01 ENCOUNTER — Encounter: Payer: Self-pay | Admitting: Internal Medicine

## 2021-04-01 ENCOUNTER — Other Ambulatory Visit: Payer: Self-pay

## 2021-04-01 ENCOUNTER — Ambulatory Visit (INDEPENDENT_AMBULATORY_CARE_PROVIDER_SITE_OTHER): Payer: Medicare Other | Admitting: Internal Medicine

## 2021-04-01 VITALS — BP 138/68 | HR 77 | Temp 97.8°F | Resp 16 | Ht 74.0 in | Wt 172.0 lb

## 2021-04-01 DIAGNOSIS — I10 Essential (primary) hypertension: Secondary | ICD-10-CM | POA: Diagnosis not present

## 2021-04-01 DIAGNOSIS — E109 Type 1 diabetes mellitus without complications: Secondary | ICD-10-CM | POA: Diagnosis not present

## 2021-04-01 DIAGNOSIS — E039 Hypothyroidism, unspecified: Secondary | ICD-10-CM | POA: Diagnosis not present

## 2021-04-01 DIAGNOSIS — I70209 Unspecified atherosclerosis of native arteries of extremities, unspecified extremity: Secondary | ICD-10-CM | POA: Diagnosis not present

## 2021-04-01 DIAGNOSIS — E1051 Type 1 diabetes mellitus with diabetic peripheral angiopathy without gangrene: Secondary | ICD-10-CM

## 2021-04-01 LAB — HEMOGLOBIN A1C: Hgb A1c MFr Bld: 6.9 % — ABNORMAL HIGH (ref 4.6–6.5)

## 2021-04-01 LAB — TSH: TSH: 0.27 u[IU]/mL — ABNORMAL LOW (ref 0.35–5.50)

## 2021-04-01 MED ORDER — LEVOTHYROXINE SODIUM 88 MCG PO TABS: 88.0000 ug | ORAL_TABLET | Freq: Every day | ORAL | 1 refills | Status: DC

## 2021-04-01 NOTE — Patient Instructions (Signed)

## 2021-04-01 NOTE — Progress Notes (Signed)
Subjective:  Patient ID: SA[place] HANK, male    DOB: 28-Dec-1950  Age: 70 y.o. MRN: [id number]  CC: Hypertension, Hypothyroidism, and Diabetes  This visit occurred during the SARS-CoV-2 public health emergency.  Safety protocols were in place, including screening questions prior to the visit, additional usage of staff PPE, and extensive cleaning of exam room while observing appropriate contact time as indicated for disinfecting solutions.    HPI Logan [name] presents for f/up -  He feels well.  Offers no complaints today.  Outpatient Medications Prior to Visit  Medication Sig Dispense Refill   amLODipine (NORVASC) 10 MG tablet Take 1 tablet by mouth once daily 90 tablet 1   aspirin 81 MG tablet Take 81 mg by mouth 2 (two) times daily.     Continuous Blood Gluc Receiver (FREESTYLE LIBRE 14 DAY READER) DEVI      Continuous Blood Gluc Sensor (FREESTYLE LIBRE 14 DAY SENSOR) MISC      insulin glargine (LANTUS) 100 UNIT/ML injection INJECT 20 UNITS SUBCUTANEOUSLY ONCE DAILY AT BEDTIME 30 mL 0   insulin NPH-regular Human (NOVOLIN 70/30) (70-30) 100 UNIT/ML injection Inject 20 Units into the skin 2 (two) times daily with a meal. (Patient taking differently: Inject 18 Units into the skin 2 (two) times daily with a meal.) 30 mL 11   irbesartan (AVAPRO) 300 MG tablet Take 1 tablet (300 mg total) by mouth daily. 90 tablet 3   PEG-KCl-NaCl-NaSulf-Na Asc-C (PLENVU) 140 g SOLR Take 1 kit by mouth as directed. Use coupon: BIN: [id number] [facility]: CNRX Group: [id number] ID: [id number] 1 each 0   rosuvastatin (CRESTOR) 10 MG tablet Take 1 tablet by mouth once daily 90 tablet 2   triamterene-hydrochlorothiazide (MAXZIDE-25) 37.5-25 MG tablet Take 1/2 (one-half) tablet by mouth once daily 45 tablet 2   levothyroxine (SYNTHROID) 100 MCG tablet Take 1 tablet by mouth once daily 90 tablet 0   No facility-administered medications prior to visit.    ROS Review of Systems  Constitutional:  Negative for  diaphoresis, fatigue and unexpected weight change.  HENT: Negative.    Eyes: Negative.   Respiratory:  Negative for cough, chest tightness, shortness of breath and wheezing.   Cardiovascular:  Negative for chest pain, palpitations and leg swelling.  Gastrointestinal:  Negative for abdominal pain, constipation, diarrhea and vomiting.  Endocrine: Negative for cold intolerance and heat intolerance.  Genitourinary: Negative.   Musculoskeletal: Negative.  Negative for arthralgias.  Skin: Negative.   Neurological:  Negative for dizziness, weakness, light-headedness and headaches.  Hematological:  Negative for adenopathy. Does not bruise/bleed easily.  Psychiatric/Behavioral: Negative.     Objective:  BP 138/68 (BP Location: Left Arm, Patient Position: Sitting, Cuff Size: Large)   Pulse 77   Temp 97.8 F (36.6 C) (Oral)   Resp 16   Ht 6' 2" (1.88 m)   Wt 172 lb (78 kg)   SpO2 97%   BMI 22.08 kg/m   BP Readings from Last 3 Encounters:  04/01/21 138/68  03/03/21 (!) 163/69  02/16/21 125/62    Wt Readings from Last 3 Encounters:  04/01/21 172 lb (78 kg)  03/03/21 173 lb 14.4 oz (78.9 kg)  01/13/21 173 lb 12.8 oz (78.8 kg)    Physical Exam Vitals reviewed.  HENT:     Nose: Nose normal.     Mouth/Throat:     Mouth: Mucous membranes are moist.  Eyes:     Conjunctiva/sclera: Conjunctivae normal.  Cardiovascular:     Rate and Rhythm: Normal  rate and regular rhythm.     Heart sounds: Murmur heard.  Systolic murmur is present with a grade of 1/6.  No diastolic murmur is present.    No gallop.  Pulmonary:     Effort: Pulmonary effort is normal.     Breath sounds: No stridor. No wheezing, rhonchi or rales.  Abdominal:     General: Abdomen is flat. There is no distension.     Palpations: There is no mass.     Tenderness: There is no abdominal tenderness. There is no guarding.  Musculoskeletal:        General: Normal range of motion.     Cervical back: Neck supple.     Right  lower leg: No edema.     Left lower leg: No edema.  Skin:    General: Skin is warm and dry.  Neurological:     General: No focal deficit present.     Mental Status: He is alert.  Psychiatric:        Mood and Affect: Mood normal.        Behavior: Behavior normal.    Lab Results  Component Value Date   WBC 7.4 03/03/2021   HGB 13.3 03/03/2021   HCT 38.4 (L) 03/03/2021   PLT 227 03/03/2021   GLUCOSE 72 01/13/2021   CHOL 94 06/17/2020   TRIG 54.0 06/17/2020   HDL 47.60 06/17/2020   LDLCALC 36 06/17/2020   ALT 32 01/13/2021   AST 28 01/13/2021   NA 140 01/13/2021   K 4.2 01/13/2021   CL 103 01/13/2021   CREATININE 1.29 (H) 01/13/2021   BUN 18 01/13/2021   CO2 29 01/13/2021   TSH 0.27 (L) 04/01/2021   PSA 2.38 11/30/2020   HGBA1C 6.9 (H) 04/01/2021   MICROALBUR 2.2 (H) 06/17/2020    CT ABDOMEN PELVIS WO CONTRAST  Result Date: 10/08/2020 CLINICAL DATA:  Left upper quadrant abdominal pain. Rule out splenic infarct. EXAM: CT ABDOMEN AND PELVIS WITHOUT CONTRAST TECHNIQUE: Multidetector CT imaging of the abdomen and pelvis was performed following the standard protocol without IV contrast. COMPARISON:  04/23/2009 FINDINGS: Lower chest: Coronary artery calcification is evident. Hepatobiliary: No focal abnormality in the liver on this study without intravenous contrast. There is no evidence for gallstones, gallbladder wall thickening, or pericholecystic fluid. No intrahepatic or extrahepatic biliary dilation. Pancreas: No focal mass lesion. No dilatation of the main duct. No intraparenchymal cyst. No peripancreatic edema. Spleen: No splenomegaly. No focal mass lesion. Adrenals/Urinary Tract: No adrenal nodule or mass. Kidneys unremarkable. No evidence for hydroureter. The urinary bladder appears normal for the degree of distention. Stomach/Bowel: Stomach is unremarkable. No gastric wall thickening. No evidence of outlet obstruction. Duodenum is normally positioned as is the ligament of  Treitz. No small bowel wall thickening. No small bowel dilatation. The terminal ileum is normal. The appendix is normal. No gross colonic mass. No colonic wall thickening. Large stool volume noted right and transverse colon. Left colon decompressed. Vascular/Lymphatic: There is abdominal aortic atherosclerosis without aneurysm. There is no gastrohepatic or hepatoduodenal ligament lymphadenopathy. No retroperitoneal or mesenteric lymphadenopathy. No pelvic sidewall lymphadenopathy. Reproductive: The prostate gland and seminal vesicles are unremarkable. Other: No intraperitoneal free fluid. Musculoskeletal: No worrisome lytic or sclerotic osseous abnormality. IMPRESSION: 1. No acute findings in the abdomen or pelvis. Specifically, no findings to explain the patient's history of left upper quadrant pain. Evaluation for splenic infarct markedly limited due to lack of intravenous contrast material. 2. Aortic Atherosclerosis (ICD10-I70.0). Electronically Signed   By:  Misty Stanley M.D.   On: 10/08/2020 09:52    Assessment & Plan:   Karandeep was seen today for hypertension, hypothyroidism and diabetes.  Diagnoses and all orders for this visit:  Type 1 diabetes mellitus without complication ([place])- His blood sugar is adequately well controlled. -     Hemoglobin A1c; Future -     Hemoglobin A1c  Acquired hypothyroidism- His TSH is suppressed.  I have decreased his dose of T4. -     TSH; Future -     TSH -     levothyroxine (SYNTHROID) 88 MCG tablet; Take 1 tablet (88 mcg total) by mouth daily.  Essential hypertension- His blood pressure is adequately well controlled.  I have discontinued Rayaan L. Dentler's levothyroxine. I am also having him start on levothyroxine. Additionally, I am having him maintain his aspirin, insulin NPH-regular Human, amLODipine, insulin glargine, rosuvastatin, triamterene-hydrochlorothiazide, FreeStyle Libre 14 Day Reader, FreeStyle Libre 14 Day Sensor, irbesartan, and  Plenvu.  Meds ordered this encounter  Medications   levothyroxine (SYNTHROID) 88 MCG tablet    Sig: Take 1 tablet (88 mcg total) by mouth daily.    Dispense:  90 tablet    Refill:  1      Follow-up: Return in about 6 months (around 09/30/2021).  Scarlette Calico, MD

## 2021-04-13 ENCOUNTER — Ambulatory Visit: Payer: Self-pay | Admitting: *Deleted

## 2021-04-14 ENCOUNTER — Other Ambulatory Visit: Payer: Self-pay

## 2021-04-14 ENCOUNTER — Inpatient Hospital Stay: Payer: Medicare Other | Attending: Hematology and Oncology

## 2021-04-14 LAB — CBC WITH DIFFERENTIAL (CANCER CENTER ONLY)
Abs Immature Granulocytes: 0.01 10*3/uL (ref 0.00–0.07)
Basophils Absolute: 0.1 10*3/uL (ref 0.0–0.1)
Basophils Relative: 2 %
Eosinophils Absolute: 0.5 10*3/uL (ref 0.0–0.5)
Eosinophils Relative: 9 %
HCT: 43 % (ref 39.0–52.0)
Hemoglobin: 14.7 g/dL (ref 13.0–17.0)
Immature Granulocytes: 0 %
Lymphocytes Relative: 34 %
Lymphs Abs: 2 10*3/uL (ref 0.7–4.0)
MCH: 31.8 pg (ref 26.0–34.0)
MCHC: 34.2 g/dL (ref 30.0–36.0)
MCV: 93.1 fL (ref 80.0–100.0)
Monocytes Absolute: 0.7 10*3/uL (ref 0.1–1.0)
Monocytes Relative: 11 %
Neutro Abs: 2.7 10*3/uL (ref 1.7–7.7)
Neutrophils Relative %: 44 %
Platelet Count: 216 10*3/uL (ref 150–400)
RBC: 4.62 MIL/uL (ref 4.22–5.81)
RDW: 12.9 % (ref 11.5–15.5)
WBC Count: 6 10*3/uL (ref 4.0–10.5)
nRBC: 0 % (ref 0.0–0.2)

## 2021-04-14 LAB — FERRITIN: Ferritin: 30 ng/mL (ref 24–336)

## 2021-04-15 ENCOUNTER — Other Ambulatory Visit: Payer: Self-pay | Admitting: *Deleted

## 2021-04-15 NOTE — Patient Outreach (Signed)
.  [facility]) Care Management  04/15/2021  Logan [name] [birth year] [id number]   RN Health Coach attempted follow up outreach call to patient.  Patient was unavailable. No voice mail picked up.  Plan: RN will call patient again within 30 days.  [place] Care Management [phone]

## 2021-05-05 DIAGNOSIS — E109 Type 1 diabetes mellitus without complications: Secondary | ICD-10-CM | POA: Diagnosis not present

## 2021-05-05 DIAGNOSIS — E7800 Pure hypercholesterolemia, unspecified: Secondary | ICD-10-CM | POA: Diagnosis not present

## 2021-05-05 DIAGNOSIS — E039 Hypothyroidism, unspecified: Secondary | ICD-10-CM | POA: Diagnosis not present

## 2021-05-10 ENCOUNTER — Other Ambulatory Visit: Payer: Self-pay | Admitting: *Deleted

## 2021-05-10 NOTE — Patient Outreach (Signed)
[facility]) Care Management  05/10/2021  Logan [name] 1950/06/13 [id number]   RN Health Coach attempted follow up outreach call to patient.  Patient was unavailable. HIPPA compliance voicemail message left with return callback number.  Plan: RN will call patient again within 30 days.  [name] Care Management [phone]

## 2021-05-12 DIAGNOSIS — E11319 Type 2 diabetes mellitus with unspecified diabetic retinopathy without macular edema: Secondary | ICD-10-CM | POA: Diagnosis not present

## 2021-05-12 DIAGNOSIS — I10 Essential (primary) hypertension: Secondary | ICD-10-CM | POA: Diagnosis not present

## 2021-05-12 DIAGNOSIS — E109 Type 1 diabetes mellitus without complications: Secondary | ICD-10-CM | POA: Diagnosis not present

## 2021-05-12 DIAGNOSIS — E039 Hypothyroidism, unspecified: Secondary | ICD-10-CM | POA: Diagnosis not present

## 2021-05-12 DIAGNOSIS — E1140 Type 2 diabetes mellitus with diabetic neuropathy, unspecified: Secondary | ICD-10-CM | POA: Diagnosis not present

## 2021-05-12 DIAGNOSIS — E7800 Pure hypercholesterolemia, unspecified: Secondary | ICD-10-CM | POA: Diagnosis not present

## 2021-05-12 DIAGNOSIS — I2510 Atherosclerotic heart disease of native coronary artery without angina pectoris: Secondary | ICD-10-CM | POA: Diagnosis not present

## 2021-05-15 ENCOUNTER — Other Ambulatory Visit: Payer: Self-pay | Admitting: Internal Medicine

## 2021-05-15 DIAGNOSIS — I70209 Unspecified atherosclerosis of native arteries of extremities, unspecified extremity: Secondary | ICD-10-CM

## 2021-05-15 DIAGNOSIS — E1051 Type 1 diabetes mellitus with diabetic peripheral angiopathy without gangrene: Secondary | ICD-10-CM

## 2021-05-26 ENCOUNTER — Ambulatory Visit: Payer: Medicare Other | Admitting: Hematology and Oncology

## 2021-05-26 ENCOUNTER — Other Ambulatory Visit: Payer: Medicare Other

## 2021-05-28 ENCOUNTER — Telehealth: Payer: Self-pay | Admitting: Hematology and Oncology

## 2021-05-28 NOTE — Telephone Encounter (Signed)
Sch per 12/20 los, pt aware °

## 2021-06-08 ENCOUNTER — Other Ambulatory Visit: Payer: Self-pay | Admitting: *Deleted

## 2021-06-08 NOTE — Patient Outreach (Signed)
[facility]) Care Management  06/08/2021  NOLE ROBEY March 10, 1951 [id number]   RN Health Coach attempted follow up outreach call to patient.  Patient was unavailable. HIPPA compliance voicemail message left with return callback number.  Plan: RN will call patient again within 30 days. Rn sent Unsuccessful outreach letter  [place] Management [phone]

## 2021-06-10 ENCOUNTER — Other Ambulatory Visit: Payer: Self-pay | Admitting: *Deleted

## 2021-06-10 NOTE — Patient Instructions (Signed)
Visit Information  Thank you for taking time to visit with me today. Please don't hesitate to contact me if I can be of assistance to you before our next scheduled telephone appointment.  Following are the goals we discussed today:  Current Barriers:  Knowledge Deficits related to plan of care for management of DMII   RNCM Clinical Goal(s):  Patient will verbalize understanding of plan for management of DMII as evidenced by continuation of monitoring blood sugars and adhering diabetic diet through collaboration with RN Care manager, provider, and care team.   Interventions: Inter-disciplinary care team collaboration (see longitudinal plan of care) Evaluation of current treatment plan related to  self management and patient's adherence to plan as established by provider  Patient Goals/Self-Care Activities: Take medications as prescribed   Attend all scheduled provider appointments Call pharmacy for medication refills 3-7 days in advance of running out of medications Perform all self care activities independently  Perform IADL's (shopping, preparing meals, housekeeping, managing finances) independently Call provider office for new concerns or questions  call the Suicide and Crisis Lifeline: 988 if experiencing a Mental Health or [place]  keep appointment with eye doctor schedule appointment with eye doctor check blood sugar at prescribed times: 4 times daily and when you have symptoms of low or high blood sugar check feet daily for cuts, sores or redness take the blood sugar meter to all doctor visits trim toenails straight across drink 6 to 8 glasses of water each day set a realistic goal    Our next appointment is by telephone on August 14, 2021  Please call the [place] at [phone] if you need to cancel or reschedule your appointment.   Please call the Suicide and Crisis Lifeline: 988 if you are experiencing a Mental Health or [place]  or need someone to talk to.  The patient verbalized understanding of instructions, educational materials, and care plan provided today and agreed to receive a mailed copy of patient instructions, educational materials, and care plan.   Telephone follow up appointment with care management team member scheduled for: The patient has been provided with contact information for the care management team and has been advised to call with any health related questions or concerns.   [place] Care Management [phone]

## 2021-06-10 NOTE — Patient Outreach (Signed)
[facility]) Care Management [place] Note   06/10/2021 Name:  Logan [name] MRN:  [id number] DOB:  [birth year]  Summary: Per patient blood sugar was 12. A1C is 6.7. Per patient he rarely has a hypoglycemic episodes. His last hypoglycemic was low 53. Per patient he ate some food and it came back up. Per patient he does a lot of deer hunting in the winter for exercise. He stated he takes food to prevent any hypoglycemic reactions.   Recommendations/Changes made from today's visit: RN discussed monitoring food portions    Subjective: Logan [name] is an 71 y.o. year old male who is a primary patient of Janith Lima, MD. The care management team was consulted for assistance with care management and/or care coordination needs.    RN Health Coach completed Telephone Visit today.   Objective:  Medications Reviewed Today     Reviewed by Janith Lima, MD (Physician) on 04/01/21 at 0824  Med List Status: <None>   Medication Order Taking? Sig Documenting Provider Last Dose Status Informant  amLODipine (NORVASC) 10 MG tablet [id number] Yes Take 1 tablet by mouth once daily Janith Lima, MD Taking Active   aspirin 81 MG tablet [id number] Yes Take 81 mg by mouth 2 (two) times daily. [provider] Taking Active Self           Med Note Allene Dillon Aug 31, 2016  3:45 PM)    Continuous Blood Gluc Receiver (FREESTYLE LIBRE [place]) DEVI [id number] Yes  [provider] Taking Active   Continuous Blood Gluc Sensor (FREESTYLE LIBRE [place]) [place] [id number] Yes  [provider] Taking Active   insulin glargine (LANTUS) 100 UNIT/ML injection [id number] Yes INJECT 20 UNITS SUBCUTANEOUSLY ONCE DAILY AT BEDTIME Janith Lima, MD Taking Active            Med Note Verlin Grills   Tue Jan 19, 2021 10:24 AM) Per patient he takes 12units or less  insulin NPH-regular Human (NOVOLIN 70/30) (70-30) 100 UNIT/ML injection  [id number] Yes Inject 20 Units into the skin 2 (two) times daily with a meal.  Patient taking differently: Inject 18 Units into the skin 2 (two) times daily with a meal.   Janith Lima, MD Taking Active            Med Note Delice Bison, [place] Feb 09, 2021 11:23 AM)    irbesartan (AVAPRO) 300 MG tablet [id number] Yes Take 1 tablet (300 mg total) by mouth daily. Biagio Borg, MD Taking Active   levothyroxine (SYNTHROID) 100 MCG tablet [id number] Yes Take 1 tablet by mouth once daily Janith Lima, MD Taking Active   PEG-KCl-NaCl-NaSulf-Na Asc-C (PLENVU) 140 g SOLR [id number] Yes Take 1 kit by mouth as directed. Use coupon: BIN: [id number] [facility]: CNRX Group: [id number] ID: [id number] Jerene Bears, MD Taking Active   rosuvastatin (CRESTOR) 10 MG tablet [id number] Yes Take 1 tablet by mouth once daily Fay Records, MD Taking Active   triamterene-hydrochlorothiazide [facility]) 37.5-25 MG tablet [id number] Yes Take 1/2 (one-half) tablet by mouth once daily Fay Records, MD Taking Active              SDOH:  (Social Determinants of Health) assessments and interventions performed:  SDOH Interventions    Flowsheet Row Most Recent Value  SDOH Interventions   Food Insecurity Interventions Intervention Not Indicated  Housing Interventions Intervention Not Indicated  Transportation  Interventions Intervention Not Indicated       Care Plan  Review of patient past medical history, allergies, medications, health status, including review of consultants reports, laboratory and other test data, was performed as part of comprehensive evaluation for care management services.   Care Plan : Diabetes Type 1 (Adult)  Updates made by Verlin Grills, RN since 06/10/2021 12:00 AM     Problem: Glycemic Management (Diabetes, Type 1) Resolved 06/10/2021  Priority: High  Onset Date: 07/27/2020  Note:   Resolving due to duplicate goal     Long-Range Goal: Glycemic Management Optimized Completed 06/10/2021   Start Date: 07/27/2020  Expected End Date: 06/04/2021  Priority: High  Note:   Evidence-based guidance:  Anticipate A1C testing (point-of-care) every 3 to 6 months based on goal attainment.  Review mutually set A1C goal or target range.  Anticipate screening for autoimmune thyroid disease, vitamin B12 deficiency with anemia and celiac disease based on presenting signs/symptoms.  Anticipate use of insulin with periodic adjustments; consider active involvement of pharmacist.  Provide medical nutrition therapy and development of individualized eating plan.  Compare self-reported symptoms of hypo or hyperglycemia to blood glucose levels, diet and fluid intake, current medications, psychosocial and physiologic stressors, change in activity and barriers to care adherence.  Promote self-monitoring of blood glucose levels, interval or continuous.  Assess and address barriers to management plan, such as food insecurity, age, developmental ability, depression, anxiety, fear of hypoglycemia or weight gain, as well as medication cost, side effects and complicated regimen.  Assess for disordered eating behaviors based on presenting signs/symptoms and risk factors.  Consider referral to community-based diabetes education program, visiting nurse, community health worker or health coach.  Encourage regular dental care for treatment of periodontal disease; refer to dental provider when needed.   Notes:     Task: Alleviate Barriers to Glycemic Management Completed 06/10/2021  Due Date: 06/04/2021  Note:   Care Management Activities:    - barriers to adherence to treatment plan identified - blood glucose monitoring encouraged - blood glucose readings reviewed - mutual A1C goal set or reviewed - resources required to improve adherence to care identified - self-awareness of signs/symptoms of hypo or hyperglycemia encouraged - use of blood glucose monitoring log promoted    Notes:     Problem: Disease  Progression (Diabetes, Type 1) Resolved 06/10/2021  Priority: Medium  Onset Date: 01/24/2021  Note:   [id number] Resolving due to duplicate goal     Long-Range Goal: Disease Progression Prevented or Minimized Completed 06/10/2021  Start Date: 07/27/2020  Expected End Date: 06/04/2021  Recent Progress: On track  Priority: Medium  Note:   Evidence-based guidance:  Prepare patient for laboratory and diagnostic exams based on risk and presentation.  Encourage lifestyle changes, such as increased intake of plant-based foods, stress reduction, regular physical activity and smoking cessation to prevent long-term complications and chronic diseases.  Discourage sedentary behavior or sitting longer than 30 minutes without interruption; individualize exercise or activity recommendations with potential limitations, such as neuropathy, retinopathy or the ability to prevent   hyperglycemia or hypoglycemia.  Prepare patient for use of pharmacologic therapy that may include antihypertensive, analgesic, prostaglandin E1 with periodic adjustments based on presenting chronic condition and laboratory results.  Assess signs/symptoms and risk factors for hypertension, sleep-disordered breathing, neuropathy (including changes in gait and balance), retinopathy, nephropathy and sexual dysfunction.  Address pregnancy planning and contraceptive choice, especially when prescribing antihypertensive or statin.  Implement additional individualized goals and interventions based on  identified risk factors.  Prepare patient for consultation or referral for specialist care, such as ophthalmology, neurology, cardiology, podiatry, nephrology or perinatology.   Notes:  [id number] Resolving due to duplicate goal     Task: Monitor and Manage Follow-up for Comorbidities Completed 06/10/2021  Due Date: 06/04/2021  Note:   Care Management Activities:    - activity based on tolerance and functional limitations encouraged - completion of  annual dilated eye exam confirmed - completion of annual foot exam verified - healthy lifestyle promoted - quality of sleep assessed - signs/symptoms of comorbidities identified    Notes:     Care Plan : [place] of Care  Updates made by Gwyneth Fernandez, Eppie Gibson, RN since 06/10/2021 12:00 AM     Problem: Knowledge Deficit Related to Diabetes and Care Coordination Needs      Long-Range Goal: Development Plan of [place] of Diabetes   Start Date: 06/09/2021  Expected End Date: 06/04/2022  Priority: High  Note:   Current Barriers:  Knowledge Deficits related to plan of care for management of DMII   RNCM Clinical Goal(s):  Patient will verbalize understanding of plan for management of DMII as evidenced by continuation of monitoring blood sugars and adhering diabetic diet  through collaboration with RN Care manager, provider, and care team.   Interventions: Inter-disciplinary care team collaboration (see longitudinal plan of care) Evaluation of current treatment plan related to  self management and patient's adherence to plan as established by provider  Patient Goals/Self-Care Activities: Take medications as prescribed   Attend all scheduled provider appointments Call pharmacy for medication refills 3-7 days in advance of running out of medications Perform all self care activities independently  Perform IADL's (shopping, preparing meals, housekeeping, managing finances) independently Call provider office for new concerns or questions  call the Suicide and Crisis Lifeline: 988 if experiencing a Mental Health or [place]  keep appointment with eye doctor schedule appointment with eye doctor check blood sugar at prescribed times: 4 times daily and when you have symptoms of low or high blood sugar check feet daily for cuts, sores or redness take the blood sugar meter to all doctor visits trim toenails straight across drink 6 to 8 glasses of water  each day set a realistic goal        Plan: Telephone follow up appointment with care management team member scheduled for:  September 14, 2021 The patient has been provided with contact information for the care management team and has been advised to call with any health related questions or concerns.   [place] Care Management [phone]

## 2021-06-11 ENCOUNTER — Telehealth: Payer: Medicare Other

## 2021-06-11 NOTE — Progress Notes (Incomplete)
Chronic Care Management Pharmacy Note  06/11/2021 Name:  Logan [name] MRN:  [id number] DOB:  Jun 13, 1950  Summary: - Patient reports that he feels he is doing well, reports that he checks blood pressure regularly at home, typically averaging 125-140/60-70, denies any issues with hypotension, notes at time he can feel fatigued at times, but never issues with presyncopal symptoms -uses freestyle libre to monitor blood sugars, notes that in AM can have hypoglycemic readings which he will eat honey to correct, reports that it does happen more often in the warmer weather, but is able to manage, follows with Dr. Chalmers Cater  -following with phlebotomy every 2 weeks at this time for treatment of hemachromatosis - ferritin decreasing - last check was  78 - down from 130.1 -LDL well controlled, last check was at 36, TSH in range with last check, no issues or complaints with medications  Recommendations/Changes made from today's visit: -Recommending no changes to medications at this time, encouraged patient to continue closely monitoring blood sugars, patient plans to discuss possible restart of an insulin pump with Dr. Chalmers Cater at next visit, for now will continue current doses of insulin, aware of signs/symptoms of hypoglycemia and how to appropriately manage    Subjective: Logan [name] is an 71 y.o. year old male who is a primary patient of Janith Lima, MD.  The CCM team was consulted for assistance with disease management and care coordination needs.    Engaged with patient by telephone for follow up visit in response to provider referral for pharmacy case management and/or care coordination services.   Consent to Services:  The patient was given the following information about Chronic Care Management services today, agreed to services, and gave verbal consent: 1. CCM service includes personalized support from designated clinical staff supervised by the primary care provider, including individualized  plan of care and coordination with other care providers 2. 24/7 contact phone numbers for assistance for urgent and routine care needs. 3. Service will only be billed when office clinical staff spend 20 minutes or more in a month to coordinate care. 4. Only one practitioner may furnish and bill the service in a calendar month. 5.The patient may stop CCM services at any time (effective at the end of the month) by phone call to the office staff. 6. The patient will be responsible for cost sharing (co-pay) of up to 20% of the service fee (after annual deductible is met). Patient agreed to services and consent obtained.  Patient Care Team: Janith Lima, MD as PCP - General (Internal Medicine) Fay Records, MD as PCP - Cardiology (Cardiology) Pleasant, Eppie Gibson, RN as [place] Management Bellamia Ferch, Darnelle Maffucci, [facility] as Pharmacist (Pharmacist)  Recent office visits:  04/01/2021 - Dr. Ronnald Ramp - decreased levothyroxine dose to 63mg daily - all other medications continued - follow up in 6 months    Recent consult visits:  03/03/2021 - Dr. DLorenso Courier- Heme/Onc  - hereditary hemochromatosis - continue phelbotomy every 2 weeks - goal ferritin <50 - follow up in 6 weeks for labs and [facility]:  None in previous 6 months  Objective:  Lab Results  Component Value Date   CREATININE 1.29 (H) 01/13/2021   BUN 18 01/13/2021   GFR 59.25 (L) 10/12/2020   GFRNONAA >60 01/13/2021   GFRAA 71 08/31/2016   NA 140 01/13/2021   K 4.2 01/13/2021   CALCIUM 9.5 01/13/2021   CO2  29 01/13/2021   GLUCOSE 72 01/13/2021    Lab Results  Component Value Date/Time   HGBA1C 6.9 (H) 04/01/2021 08:25 AM   HGBA1C 7.8 (H) 10/12/2020 02:20 PM   HGBA1C 9.7 08/30/2017 12:00 AM   HGBA1C 8.3 11/18/2015 12:00 AM   GFR 59.25 (L) 10/12/2020 02:20 PM   GFR 61.69 08/18/2020 08:36 AM   MICROALBUR 2.2 (H) 06/17/2020 08:30 AM   MICROALBUR 0.7 01/21/2019 08:36 AM    Last diabetic Eye exam:   Lab Results  Component Value Date/Time   HMDIABEYEEXA Retinopathy (A) 04/20/2020 12:00 AM    Last diabetic Foot exam:  Lab Results  Component Value Date/Time   HMDIABFOOTEX done 11/23/2017 12:00 AM     Lab Results  Component Value Date   CHOL 94 06/17/2020   HDL 47.60 06/17/2020   LDLCALC 36 06/17/2020   TRIG 54.0 06/17/2020   CHOLHDL 2 06/17/2020    Hepatic Function Latest Ref Rng & Units 01/13/2021 12/09/2020 12/09/2020  Total Protein 6.5 - 8.1 g/dL 7.4 - 7.6  Albumin 3.5 - 5.0 g/dL 4.2 - 4.5  AST 15 - 41 U/L 28 - 22  ALT 0 - 44 U/L 32 - 26  Alk Phosphatase 38 - 126 U/L 165(H) 195(H) 164(H)  Total Bilirubin 0.3 - 1.2 mg/dL 1.0 - 1.1  Bilirubin, Direct 0.0 - 0.3 mg/dL - - 0.2    Lab Results  Component Value Date/Time   TSH 0.27 (L) 04/01/2021 08:25 AM   TSH 1.80 08/18/2020 08:36 AM   FREET4 0.41 (L) 04/24/2015 09:37 AM    CBC Latest Ref Rng & Units 04/14/2021 03/03/2021 02/16/2021  WBC 4.0 - 10.5 K/uL 6.0 7.4 6.8  Hemoglobin 13.0 - 17.0 g/dL 14.7 13.3 13.6  Hematocrit 39.0 - 52.0 % 43.0 38.4(L) 39.7  Platelets 150 - 400 K/uL 216 227 214    No results found for: VD25OH  Clinical ASCVD: No  The 10-year ASCVD risk score (Arnett DK, et al., 2019) is: 24.8%*   Values used to calculate the score:     Age: 46 years     Sex: Male     Is Non-Hispanic African American: No     Diabetic: Yes     Tobacco smoker: No     Systolic Blood Pressure: 716 mmHg     Is BP treated: Yes     HDL Cholesterol: 56 mg/dL*     Total Cholesterol: 130 mg/dL*     * - Cholesterol units were assumed for this score calculation    Depression screen [facility] 2/9 04/01/2021 03/19/2020 01/23/2019  Decreased Interest 0 0 0  Down, Depressed, Hopeless 0 0 0  PHQ - 2 Score 0 0 0    Social History   Tobacco Use  Smoking Status Never  Smokeless Tobacco Never   BP Readings from Last 3 Encounters:  04/01/21 138/68  03/03/21 (!) 163/69  02/16/21 125/62   Pulse Readings from Last 3 Encounters:   04/01/21 77  03/03/21 76  02/16/21 61   Wt Readings from Last 3 Encounters:  04/01/21 172 lb (78 kg)  03/03/21 173 lb 14.4 oz (78.9 kg)  01/13/21 173 lb 12.8 oz (78.8 kg)   BMI Readings from Last 3 Encounters:  04/01/21 22.08 kg/m  03/03/21 22.33 kg/m  01/13/21 22.31 kg/m    Assessment/Interventions: Review of patient past medical history, allergies, medications, health status, including review of consultants reports, laboratory and other test data, was performed as part of comprehensive evaluation and provision of chronic care  management services.   SDOH:  (Social Determinants of Health) assessments and interventions performed: Yes  SDOH Screenings   Alcohol Screen: Not on file  Depression (PHQ2-9): Low Risk    PHQ-2 Score: 0  Financial Resource Strain: Not on file  Food Insecurity: No Food Insecurity   Worried About Charity fundraiser in the Last Year: Never true   Ran Out of Food in the Last Year: Never true  Housing: Low Risk    Last Housing Risk Score: 0  Physical Activity: Not on file  Social Connections: Not on file  Stress: Not on file  Tobacco Use: Low Risk    Smoking Tobacco Use: Never   Smokeless Tobacco Use: Never   Passive Exposure: Not on file  Transportation Needs: No Transportation Needs   Lack of Transportation (Medical): No   Lack of Transportation (Non-Medical): No    CCM Care Plan  No Known Allergies  Medications Reviewed Today     Reviewed by Janith Lima, MD (Physician) on 04/01/21 at 0824  Med List Status: <None>   Medication Order Taking? Sig Documenting Provider Last Dose Status Informant  amLODipine (NORVASC) 10 MG tablet [id number] Yes Take 1 tablet by mouth once daily Janith Lima, MD Taking Active   aspirin 81 MG tablet [id number] Yes Take 81 mg by mouth 2 (two) times daily. [provider] Taking Active Self           Med Note Allene Dillon Aug 31, 2016  3:45 PM)    Continuous Blood Gluc Receiver  (FREESTYLE LIBRE [place]) DEVI [id number] Yes  [provider] Taking Active   Continuous Blood Gluc Sensor (FREESTYLE LIBRE [place]) [place] [id number] Yes  [provider] Taking Active   insulin glargine (LANTUS) 100 UNIT/ML injection [id number] Yes INJECT 20 UNITS SUBCUTANEOUSLY ONCE DAILY AT BEDTIME Janith Lima, MD Taking Active            Med Note Verlin Grills   Tue Jan 19, 2021 10:24 AM) Per patient he takes 12units or less  insulin NPH-regular Human (NOVOLIN 70/30) (70-30) 100 UNIT/ML injection [id number] Yes Inject 20 Units into the skin 2 (two) times daily with a meal.  Patient taking differently: Inject 18 Units into the skin 2 (two) times daily with a meal.   Janith Lima, MD Taking Active            Med Note Delice Bison, [place] Feb 09, 2021 11:23 AM)    irbesartan (AVAPRO) 300 MG tablet [id number] Yes Take 1 tablet (300 mg total) by mouth daily. Biagio Borg, MD Taking Active   levothyroxine (SYNTHROID) 100 MCG tablet [id number] Yes Take 1 tablet by mouth once daily Janith Lima, MD Taking Active   PEG-KCl-NaCl-NaSulf-Na Asc-C (PLENVU) 140 g SOLR [id number] Yes Take 1 kit by mouth as directed. Use coupon: BIN: [id number] [facility]: CNRX Group: [id number] ID: [id number] Jerene Bears, MD Taking Active   rosuvastatin (CRESTOR) 10 MG tablet [id number] Yes Take 1 tablet by mouth once daily Fay Records, MD Taking Active   triamterene-hydrochlorothiazide [facility]) 37.5-25 MG tablet [id number] Yes Take 1/2 (one-half) tablet by mouth once daily Fay Records, MD Taking Active             Patient Active Problem List   Diagnosis Date Noted   Polyp of colon 42/68/3419   Systolic murmur 62/22/9798   Need for shingles vaccine 11/30/2020   Aortic  atherosclerosis ([place]) 10/09/2020   Diabetic retinopathy ([place]) 09/21/2020   Pure hypercholesterolemia 09/21/2020   Chronic renal impairment, stage 2 (mild) 07/03/2018   Nonspecific abnormal electrocardiogram  (ECG) (EKG) 12/30/2015   Chronic foot ulcer with fat layer exposed ([place]) 01/19/2015   Diabetic neuropathy ([place]) 01/19/2015   Diabetic ulcer of both feet associated with type 2 diabetes mellitus ([place]) 01/19/2015   BPH (benign prostatic hyperplasia) 08/20/2014   Nuclear sclerosis of both eyes 04/16/2014   Type 1 diabetes mellitus without complication ([place]) 44/96/7591   Hypothyroidism 01/08/2014   Hyperlipidemia LDL goal <70 05/05/2009   Type 1 diabetes mellitus without complications ([place]) 63/84/6659   Uncontrolled type 1 diabetes mellitus 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 11/01/2007   Hereditary hemochromatosis ([place]) 11/01/2007   Essential hypertension 11/01/2007   Coronary artery disease 11/01/2007    Immunization History  Administered Date(s) Administered   Influenza Split 05/10/2011, 03/20/2014   Influenza Whole 03/11/2010, 05/21/2012   Influenza, High Dose Seasonal PF 03/29/2016, 04/10/2017, 02/01/2018   Influenza-Unspecified 04/10/2017, 03/05/2020, 03/16/2021   PFIZER(Purple Top)SARS-COV-2 Vaccination 08/02/2019, 08/27/2019, 03/03/2020, 03/16/2021   Pneumococcal Conjugate-13 06/20/2017   Pneumococcal Polysaccharide-23 01/08/2014, 03/29/2016   Tdap 01/08/2014   Zoster, Live 04/10/2016    Conditions to be addressed/monitored:  Hypertension, Hyperlipidemia, Diabetes, and Hypothyroidism  There are no care plans that you recently modified to display for this patient.       Medication Assistance: None required.  Patient affirms current coverage meets needs.  Patient's preferred pharmacy is:  [place] [address] ([address] [place] [address] [place] ([place]) [place] [address] Phone: [phone] Fax: [phone]    Uses pill box? Yes Pt endorses 100% compliance  Care Plan and Follow Up Patient Decision:  Patient agrees to Care Plan and Follow-up.  Plan: Telephone follow up appointment with care management team member  scheduled for:  4 months and The patient has been provided with contact information for the care management team and has been advised to call with any health related questions or concerns.   Tomasa Blase, PharmD Clinical Pharmacist, [place]

## 2021-06-13 ENCOUNTER — Other Ambulatory Visit: Payer: Self-pay | Admitting: Internal Medicine

## 2021-06-17 ENCOUNTER — Telehealth: Payer: Self-pay | Admitting: Internal Medicine

## 2021-06-17 MED ORDER — ROSUVASTATIN CALCIUM 10 MG PO TABS: 10.0000 mg | ORAL_TABLET | Freq: Every day | ORAL | 0 refills | Status: DC

## 2021-06-17 NOTE — Telephone Encounter (Signed)
Pt scheduled appointment, 90 day refill of Rosuvastatin has been sent to [facility].

## 2021-06-17 NOTE — Telephone Encounter (Signed)
Was told by Pharmacy he need a f/u appointment with Dr. Harrington Challenger.   Was only giving a 30 day supply of med until he made an appointment.  Schedule for 09-29-21.  Want to know if he can get the 90 day supply of meds.

## 2021-06-22 ENCOUNTER — Encounter: Payer: Self-pay | Admitting: Hematology and Oncology

## 2021-06-22 ENCOUNTER — Inpatient Hospital Stay: Payer: Medicare Other | Attending: Hematology and Oncology

## 2021-06-22 ENCOUNTER — Inpatient Hospital Stay (HOSPITAL_BASED_OUTPATIENT_CLINIC_OR_DEPARTMENT_OTHER): Payer: Medicare Other | Admitting: Hematology and Oncology

## 2021-06-22 ENCOUNTER — Other Ambulatory Visit: Payer: Self-pay

## 2021-06-22 DIAGNOSIS — Z794 Long term (current) use of insulin: Secondary | ICD-10-CM | POA: Diagnosis not present

## 2021-06-22 DIAGNOSIS — Z86010 Personal history of colonic polyps: Secondary | ICD-10-CM | POA: Insufficient documentation

## 2021-06-22 DIAGNOSIS — E1136 Type 2 diabetes mellitus with diabetic cataract: Secondary | ICD-10-CM | POA: Diagnosis not present

## 2021-06-22 DIAGNOSIS — E785 Hyperlipidemia, unspecified: Secondary | ICD-10-CM | POA: Insufficient documentation

## 2021-06-22 DIAGNOSIS — R632 Polyphagia: Secondary | ICD-10-CM | POA: Insufficient documentation

## 2021-06-22 DIAGNOSIS — E11319 Type 2 diabetes mellitus with unspecified diabetic retinopathy without macular edema: Secondary | ICD-10-CM | POA: Insufficient documentation

## 2021-06-22 DIAGNOSIS — I2510 Atherosclerotic heart disease of native coronary artery without angina pectoris: Secondary | ICD-10-CM | POA: Insufficient documentation

## 2021-06-22 DIAGNOSIS — Z79899 Other long term (current) drug therapy: Secondary | ICD-10-CM | POA: Insufficient documentation

## 2021-06-22 DIAGNOSIS — Z7982 Long term (current) use of aspirin: Secondary | ICD-10-CM | POA: Insufficient documentation

## 2021-06-22 DIAGNOSIS — I10 Essential (primary) hypertension: Secondary | ICD-10-CM | POA: Diagnosis not present

## 2021-06-22 LAB — CBC WITH DIFFERENTIAL (CANCER CENTER ONLY)
Abs Immature Granulocytes: 0.01 10*3/uL (ref 0.00–0.07)
Basophils Absolute: 0.1 10*3/uL (ref 0.0–0.1)
Basophils Relative: 2 %
Eosinophils Absolute: 0.5 10*3/uL (ref 0.0–0.5)
Eosinophils Relative: 8 %
HCT: 48.1 % (ref 39.0–52.0)
Hemoglobin: 16.3 g/dL (ref 13.0–17.0)
Immature Granulocytes: 0 %
Lymphocytes Relative: 30 %
Lymphs Abs: 1.7 10*3/uL (ref 0.7–4.0)
MCH: 30.9 pg (ref 26.0–34.0)
MCHC: 33.9 g/dL (ref 30.0–36.0)
MCV: 91.1 fL (ref 80.0–100.0)
Monocytes Absolute: 0.6 10*3/uL (ref 0.1–1.0)
Monocytes Relative: 11 %
Neutro Abs: 2.8 10*3/uL (ref 1.7–7.7)
Neutrophils Relative %: 49 %
Platelet Count: 197 10*3/uL (ref 150–400)
RBC: 5.28 MIL/uL (ref 4.22–5.81)
RDW: 13.2 % (ref 11.5–15.5)
WBC Count: 5.8 10*3/uL (ref 4.0–10.5)
nRBC: 0 % (ref 0.0–0.2)

## 2021-06-22 LAB — FERRITIN: Ferritin: 36 ng/mL (ref 24–336)

## 2021-06-22 NOTE — Progress Notes (Signed)
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 Genetic testing shows patient is homozygous for C282Y 01/13/2021: establish care with Dr. Lorenso Courier  01/19/2021: Ferritin 129, Hgb 14.6 02/02/2021: Ferritin 78, Hgb 13.5 02/16/2021: Ferritin 53, Hgb 13.6 06/22/2021: ferritin 36, Hgb 16.3  Interval History:  Logan [name] 71 y.o. male with medical history significant for hereditary hemachromatosis presents for a follow up visit. The patient's last visit was on 03/03/2021. In the interim since the last visit he has not required any further phlebotomy.  On exam today Logan [name] reports he tolerated the last round of phlebotomy well without any difficulty.  He notes his energy levels have been good and he continues to walk 5 miles at a time.  He reports his appetite is "off the charts".  And that every day "is a holiday" with the way that he eats.  He notes that he never feels quite full and has quite a voracious appetite.  He is not having any other concerning symptoms at this time.  He denies any lightheadedness or dizziness.   He also notes no other overt signs of bleeding.  He currently denies any fevers, chills, sweats, nausea, vomiting or diarrhea.    A full 10 point ROS is listed below.  MEDICAL HISTORY:  Past Medical History:  Diagnosis Date   Aortic atherosclerosis ([place])    Cataract    Coronary artery disease involving native coronary artery of native heart without angina pectoris 11/01/2007   Last Cath '04: LAD w/ 60% long lesion; 1st Dx 70% ostial; 2nd Dx  70% ostial; Cx 30%  ostial; RCA-dominant multiple 30-40% lesions; PDA 30% lesions  Last nuclear stress March '09 - negative for ischemia    Diabetes mellitus    Diabetic retinopathy    Hearing loss    Hemochromatosis    Hyperlipidemia    Hypertension    Tubular adenoma of colon     SURGICAL HISTORY: Past Surgical History:  Procedure Laterality Date   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   Procedure: Right Great Toe Amputation;  Surgeon: Newt Minion, MD;  Location: [facility] ORS;  Service: Orthopedics;  Laterality: Right;  Right Great Toe Amputation   INNER EAR SURGERY     right    SOCIAL HISTORY: Social History   Socioeconomic History   Marital status: Widowed    Spouse name: Not on file   Number of children: 1   Years of education: 30   Highest education level: Not on file  Occupational History   Occupation: retired    Fish farm manager: [employer]  Tobacco Use   Smoking status: Never   Smokeless tobacco: Never  Vaping Use   Vaping Use: Never used  Substance and Sexual Activity   Alcohol use: Yes    Comment: occ beer but very rare   Drug use: No   Sexual activity: Yes    Partners: Female  Other Topics Concern   Not on file  Social History Narrative   HSG. Married - '77 - '04. 1 son '80. Work - semi-retired. Has a girlfriend.   Social Determinants of Health   Financial Resource Strain: Not on file  Food Insecurity: No Food Insecurity   Worried About Charity fundraiser in the Last Year: Never true   Ran Out of Food in the Last Year: Never true  Transportation Needs: No Transportation Needs   Lack of Transportation (Medical): No   Lack of Transportation (Non-Medical): No  Physical Activity: Not on file  Stress: Not on file  Social Connections: Not on file  Intimate Partner Violence: Not on file    FAMILY HISTORY: Family History  Problem Relation Age of Onset   Asthma Mother    Heart failure Mother    Emphysema Mother    Alcohol abuse Father    Cancer Father        prostate   Stroke  Father    Colon cancer Maternal Uncle        dx'd 74   Heart disease Neg Hx    Rectal cancer Neg Hx    Stomach cancer Neg Hx     ALLERGIES:  has No Known Allergies.  MEDICATIONS:  Current Outpatient Medications  Medication Sig Dispense Refill   amLODipine (NORVASC) 10 MG tablet Take 1 tablet by mouth once daily 90 tablet 1   aspirin 81 MG tablet Take 81 mg by mouth 2 (two) times daily.     Continuous Blood Gluc Receiver (FREESTYLE LIBRE 14 DAY READER) DEVI      Continuous Blood Gluc Sensor (FREESTYLE LIBRE 14 DAY SENSOR) MISC      insulin glargine (LANTUS) 100 UNIT/ML injection INJECT 20 UNITS SUBCUTANEOUSLY ONCE DAILY AT BEDTIME 30 mL 0   insulin NPH-regular Human (NOVOLIN 70/30) (70-30) 100 UNIT/ML injection Inject 20 Units into the skin 2 (two) times daily with a meal. (Patient taking differently: Inject 18 Units into the skin 2 (two) times daily with a meal.) 30 mL 11   irbesartan (AVAPRO) 300 MG tablet Take 1 tablet (300 mg total) by mouth daily. 90 tablet 3   levothyroxine (SYNTHROID) 88 MCG tablet Take 1 tablet (88 mcg total) by mouth daily. 90 tablet 1   PEG-KCl-NaCl-NaSulf-Na Asc-C (PLENVU) 140 g SOLR Take 1 kit by mouth as directed. Use coupon: BIN: [id number] [facility]: CNRX Group: [id number] ID: [id number] 1 each 0   rosuvastatin (CRESTOR) 10 MG tablet Take 1 tablet (10 mg total) by mouth daily. 90 tablet 0   triamterene-hydrochlorothiazide (MAXZIDE-25) 37.5-25 MG tablet Take 1/2 (one-half) tablet by mouth once daily 15 tablet 0   No current facility-administered medications for this visit.    REVIEW OF SYSTEMS:   Constitutional: ( - ) fevers, ( - )  chills , ( - ) night sweats Eyes: ( - ) blurriness of vision, ( - ) double vision, ( - ) watery eyes Ears, nose, mouth, throat, and face: ( - ) mucositis, ( - ) sore throat Respiratory: ( - ) cough, ( - ) dyspnea, ( - ) wheezes Cardiovascular: ( - ) palpitation, ( - ) chest discomfort, ( - ) lower extremity swelling Gastrointestinal:   ( - ) nausea, ( - ) heartburn, ( - ) change in bowel habits Skin: ( - ) abnormal skin rashes Lymphatics: ( - ) new lymphadenopathy, ( - ) easy bruising Neurological: ( - ) numbness, ( - ) tingling, ( - ) new weaknesses Behavioral/Psych: ( - ) mood change, ( - ) new changes  All other systems were reviewed with the patient and are negative.  PHYSICAL EXAMINATION: ECOG PERFORMANCE STATUS: 0 - Asymptomatic  Vitals:   06/22/21 1014  BP: 131/70  Pulse:  61  Resp: 17  Temp: 97.7 F (36.5 C)  SpO2: 98%   Filed Weights   06/22/21 1014  Weight: 171 lb 14.4 oz (78 kg)    GENERAL: Well-appearing elderly Caucasian male, alert, no distress and comfortable SKIN: skin color, texture, turgor are normal, no rashes or significant lesions EYES: conjunctiva are pink and non-injected, sclera clear LUNGS: clear to auscultation and percussion with normal breathing effort HEART: regular rate & rhythm and no murmurs and no lower extremity edema. Musculoskeletal: no cyanosis of digits and no clubbing  PSYCH: alert & oriented x 3, fluent speech NEURO: no focal motor/sensory deficits  LABORATORY DATA:  I have reviewed the data as listed CBC Latest Ref Rng & Units 06/22/2021 04/14/2021 03/03/2021  WBC 4.0 - 10.5 K/uL 5.8 6.0 7.4  Hemoglobin 13.0 - 17.0 g/dL 16.3 14.7 13.3  Hematocrit 39.0 - 52.0 % 48.1 43.0 38.4(L)  Platelets 150 - 400 K/uL 197 216 227    CMP Latest Ref Rng & Units 01/13/2021 12/09/2020 12/09/2020  Glucose 70 - 99 mg/dL 72 - -  BUN 8 - 23 mg/dL 18 - -  Creatinine 0.61 - 1.24 mg/dL 1.29(H) - -  Sodium 135 - 145 mmol/L 140 - -  Potassium 3.5 - 5.1 mmol/L 4.2 - -  Chloride 98 - 111 mmol/L 103 - -  CO2 22 - 32 mmol/L 29 - -  Calcium 8.9 - 10.3 mg/dL 9.5 - -  Total Protein 6.5 - 8.1 g/dL 7.4 - 7.6  Total Bilirubin 0.3 - 1.2 mg/dL 1.0 - 1.1  Alkaline Phos 38 - 126 U/L 165(H) 195(H) 164(H)  AST 15 - 41 U/L 28 - 22  ALT 0 - 44 U/L 32 - 26   RADIOGRAPHIC STUDIES: I have personally  reviewed the radiological images as listed and agreed with the findings in the report. No results found.  ASSESSMENT & PLAN Logan [name] 71 y.o. male with medical history significant for hereditary hemachromatosis presents for a follow up visit.  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  The patient was previously treated with phlebotomy when he was previously diagnosed by the GI department.  Due to his recent elevations in ferritin he was referred to hematology in order to manage his disorder.  Our recommendation would be for a goal ferritin of less than 50.  We will achieve this with phlebotomies performed every 2 weeks until he reaches target.  We will plan to start treatments next week.  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  -- if ferritin levels become >50 will start phlebotomy every 2 weeks until patient reaches target ferritin --labs today show Hgb 16.3, WBC 5.8, Plt 197. Iron studies pending.  --RTC in 3 months for labs and 6 months for repeat clinic visit  No orders of the defined types were placed in this encounter.   All questions were answered. The patient knows to call the clinic with any problems, questions or concerns.  A total of more than 30 minutes were spent on this encounter with face-to-face time and non-face-to-face time, including preparing to see the patient, ordering tests and/or medications, counseling the patient and coordination of care as outlined above.   Ledell Peoples, MD Department of Hematology/Oncology [facility] [place]  [facility] Phone: [phone] Pager: [phone] Email: [email or web address]  06/22/2021 12:29 PM

## 2021-06-23 ENCOUNTER — Telehealth: Payer: Self-pay | Admitting: Hematology and Oncology

## 2021-06-23 NOTE — Telephone Encounter (Signed)
Scheduled per 1/17 los, message has been left with pt

## 2021-06-25 ENCOUNTER — Telehealth: Payer: Self-pay

## 2021-06-25 NOTE — Telephone Encounter (Signed)
Left HIPPA compliant VM requesting pt to call office for recent lab results.

## 2021-06-30 ENCOUNTER — Telehealth: Payer: Self-pay

## 2021-06-30 NOTE — Telephone Encounter (Signed)
Called patient to advise that his Ferritin level is 36 and will have labs in 3 months and visit with Dr. Lorenso Courier in 6 months. Future appointments reviewed. Pt encouraged to call back with questions or concerns. Pt voiced appreciation for Dr. Marisa Hua.

## 2021-07-15 ENCOUNTER — Other Ambulatory Visit: Payer: Self-pay

## 2021-07-15 ENCOUNTER — Encounter: Payer: Self-pay | Admitting: Internal Medicine

## 2021-07-15 ENCOUNTER — Ambulatory Visit (INDEPENDENT_AMBULATORY_CARE_PROVIDER_SITE_OTHER): Payer: Medicare Other | Admitting: Internal Medicine

## 2021-07-15 VITALS — BP 128/90 | HR 88 | Resp 18 | Ht 74.0 in | Wt 173.8 lb

## 2021-07-15 DIAGNOSIS — R109 Unspecified abdominal pain: Secondary | ICD-10-CM | POA: Diagnosis not present

## 2021-07-15 DIAGNOSIS — H60393 Other infective otitis externa, bilateral: Secondary | ICD-10-CM

## 2021-07-15 LAB — COMPREHENSIVE METABOLIC PANEL
ALT: 27 U/L (ref 0–53)
AST: 25 U/L (ref 0–37)
Albumin: 4.2 g/dL (ref 3.5–5.2)
Alkaline Phosphatase: 143 U/L — ABNORMAL HIGH (ref 39–117)
BUN: 19 mg/dL (ref 6–23)
CO2: 32 mEq/L (ref 19–32)
Calcium: 9.3 mg/dL (ref 8.4–10.5)
Chloride: 99 mEq/L (ref 96–112)
Creatinine, Ser: 1.24 mg/dL (ref 0.40–1.50)
GFR: 58.93 mL/min — ABNORMAL LOW (ref >60.00)
Glucose, Bld: 345 mg/dL — ABNORMAL HIGH (ref 70–99)
Potassium: 4.1 mEq/L (ref 3.5–5.1)
Sodium: 135 mEq/L (ref 135–145)
Total Bilirubin: 1.2 mg/dL (ref 0.2–1.2)
Total Protein: 7.3 g/dL (ref 6.0–8.3)

## 2021-07-15 LAB — URINALYSIS, ROUTINE W REFLEX MICROSCOPIC
Bilirubin Urine: NEGATIVE
Ketones, ur: NEGATIVE
Leukocytes,Ua: NEGATIVE
Nitrite: NEGATIVE
Specific Gravity, Urine: <=1.005 — AB (ref 1.000–1.030)
Total Protein, Urine: NEGATIVE
Urine Glucose: >=1000 — AB
Urobilinogen, UA: 0.2 (ref 0.0–1.0)
pH: 6.5 (ref 5.0–8.0)

## 2021-07-15 MED ORDER — AMOXICILLIN-POT CLAVULANATE 875-125 MG PO TABS: 1.0000 | ORAL_TABLET | Freq: Two times a day (BID) | ORAL | 0 refills | Status: DC

## 2021-07-15 MED ORDER — NEOMYCIN-POLYMYXIN-HC 1 % OT SOLN: 3.0000 [drp] | Freq: Three times a day (TID) | OTIC | 0 refills | Status: DC

## 2021-07-15 MED ORDER — NEOMYCIN-POLYMYXIN-HC 3.5-10000-1 OT SOLN: 3.0000 [drp] | Freq: Three times a day (TID) | OTIC | 0 refills | Status: DC

## 2021-07-15 NOTE — Progress Notes (Signed)
° °  Subjective:   Patient ID: Logan [name], male    DOB: January 14, 1951, 71 y.o.   MRN: [id number]  HPI The patient is a 71 YO man with complicated medical history coming in for flank pain and ear pain.   Review of Systems  Constitutional: Negative.   HENT:  Positive for ear discharge, ear pain and hearing loss.   Eyes: Negative.   Respiratory:  Negative for cough, chest tightness and shortness of breath.   Cardiovascular:  Negative for chest pain, palpitations and leg swelling.  Gastrointestinal:  Negative for abdominal distention, abdominal pain, constipation, diarrhea, nausea and vomiting.  Musculoskeletal:  Positive for back pain and myalgias.  Skin: Negative.   Neurological: Negative.   Psychiatric/Behavioral: Negative.     Objective:  Physical Exam Constitutional:      Appearance: He is well-developed.  HENT:     Head: Normocephalic and atraumatic.     Ears:     Comments: Bilateral TM with scarring and bulging Cardiovascular:     Rate and Rhythm: Normal rate and regular rhythm.  Pulmonary:     Effort: Pulmonary effort is normal. No respiratory distress.     Breath sounds: Normal breath sounds. No wheezing or rales.  Abdominal:     General: Bowel sounds are normal. There is no distension.     Palpations: Abdomen is soft.     Tenderness: There is no abdominal tenderness. There is no rebound.  Musculoskeletal:        General: Tenderness present.     Cervical back: Normal range of motion.     Comments: Thoracic paraspinal region with tenderness  Skin:    General: Skin is warm and dry.  Neurological:     Mental Status: He is alert and oriented to person, place, and time.     Coordination: Coordination normal.    Vitals:   07/15/21 1030  BP: 128/90  Pulse: 88  Resp: 18  SpO2: 99%  Weight: 173 lb 12.8 oz (78.8 kg)  Height: 6\' 2"  (1.88 m)    This visit occurred during the SARS-CoV-2 public health emergency.  Safety protocols were in place, including screening  questions prior to the visit, additional usage of staff PPE, and extensive cleaning of exam room while observing appropriate contact time as indicated for disinfecting solutions.   Assessment & Plan:  Visit time 20 minutes in face to face communication with patient and coordination of care, additional 15 minutes spent in record review, coordination or care, ordering tests, communicating/referring to other healthcare professionals, documenting in medical records all on the same day of the visit for total time 35 minutes spent on the visit.

## 2021-07-15 NOTE — Patient Instructions (Signed)
We have sent in augmentin which is the oral antibiotic to take 1 pill twice a day for 10 days.  We have also sent in an ear drop to use 3 drops in each ear for 3 times a day for 5 days.  We will check the urine and blood today.

## 2021-07-16 ENCOUNTER — Encounter: Payer: Self-pay | Admitting: Internal Medicine

## 2021-07-16 DIAGNOSIS — H6090 Unspecified otitis externa, unspecified ear: Secondary | ICD-10-CM | POA: Insufficient documentation

## 2021-07-16 DIAGNOSIS — R109 Unspecified abdominal pain: Secondary | ICD-10-CM | POA: Insufficient documentation

## 2021-07-16 LAB — URINE CULTURE

## 2021-07-16 NOTE — Assessment & Plan Note (Signed)
Causing hearing change. Prior scarring in the ears. Rx augmentin 10 day and cortisporin ear drops due to severity and co-morbidity of DM type 1.

## 2021-07-16 NOTE — Assessment & Plan Note (Signed)
Sounds to be muscular related to dehydration as this started after working outside for some time. Checking CMP and U/A today to assess.

## 2021-07-18 ENCOUNTER — Other Ambulatory Visit: Payer: Self-pay | Admitting: Internal Medicine

## 2021-07-18 DIAGNOSIS — I10 Essential (primary) hypertension: Secondary | ICD-10-CM

## 2021-08-03 ENCOUNTER — Other Ambulatory Visit: Payer: Self-pay

## 2021-08-03 ENCOUNTER — Ambulatory Visit (INDEPENDENT_AMBULATORY_CARE_PROVIDER_SITE_OTHER): Payer: Medicare Other | Admitting: Internal Medicine

## 2021-08-03 ENCOUNTER — Encounter: Payer: Self-pay | Admitting: Internal Medicine

## 2021-08-03 DIAGNOSIS — H60393 Other infective otitis externa, bilateral: Secondary | ICD-10-CM | POA: Diagnosis not present

## 2021-08-03 MED ORDER — AZITHROMYCIN 250 MG PO TABS: ORAL_TABLET | ORAL | 0 refills | Status: AC

## 2021-08-03 NOTE — Patient Instructions (Signed)
We can try azithromycin for the ears take 2 pills today, then starting tomorrow take 1 pill daily until gone.  Try taking claritin or zyrtec daily for next 1-2 weeks to see if this helps.

## 2021-08-03 NOTE — Progress Notes (Signed)
° °  Subjective:   Patient ID: Logan [name], male    DOB: September 28, 1950, 71 y.o.   MRN: [id number]  HPI The patient is a 71 YO man coming in for not improving ear problems  Review of Systems  Constitutional: Negative.   HENT:  Positive for ear pain and hearing loss.   Eyes: Negative.   Respiratory:  Negative for cough, chest tightness and shortness of breath.   Cardiovascular:  Negative for chest pain, palpitations and leg swelling.  Gastrointestinal:  Negative for abdominal distention, abdominal pain, constipation, diarrhea, nausea and vomiting.  Musculoskeletal: Negative.   Skin: Negative.   Neurological: Negative.   Psychiatric/Behavioral: Negative.     Objective:  Physical Exam Constitutional:      Appearance: He is well-developed.  HENT:     Head: Normocephalic and atraumatic.     Ears:     Comments: TMs improving with some increased fluid still more clear and cloudy than purulent Cardiovascular:     Rate and Rhythm: Normal rate and regular rhythm.  Pulmonary:     Effort: Pulmonary effort is normal. No respiratory distress.     Breath sounds: Normal breath sounds. No wheezing or rales.  Abdominal:     General: Bowel sounds are normal. There is no distension.     Palpations: Abdomen is soft.     Tenderness: There is no abdominal tenderness. There is no rebound.  Musculoskeletal:     Cervical back: Normal range of motion.  Skin:    General: Skin is warm and dry.  Neurological:     Mental Status: He is alert and oriented to person, place, and time.     Coordination: Coordination normal.    Vitals:   08/03/21 1538  BP: 118/70  Pulse: 77  Temp: 98.7 F (37.1 C)  TempSrc: Oral  SpO2: 96%  Weight: 172 lb (78 kg)  Height: 6\' 2"  (1.88 m)    This visit occurred during the SARS-CoV-2 public health emergency.  Safety protocols were in place, including screening questions prior to the visit, additional usage of staff PPE, and extensive cleaning of exam room while  observing appropriate contact time as indicated for disinfecting solutions.   Assessment & Plan:

## 2021-08-06 ENCOUNTER — Encounter: Payer: Self-pay | Admitting: Internal Medicine

## 2021-08-06 NOTE — Assessment & Plan Note (Signed)
Improving but not gone after 10 day augmentin. Rx 7 day course azithromycin. ?

## 2021-08-31 ENCOUNTER — Emergency Department (HOSPITAL_COMMUNITY)
Admission: EM | Admit: 2021-08-31 | Discharge: 2021-08-31 | Disposition: A | Discharge status: Home / Self Care | Payer: Medicare Other | Attending: Emergency Medicine | Admitting: Emergency Medicine

## 2021-08-31 ENCOUNTER — Encounter (HOSPITAL_COMMUNITY): Payer: Self-pay

## 2021-08-31 ENCOUNTER — Other Ambulatory Visit: Payer: Self-pay

## 2021-08-31 ENCOUNTER — Emergency Department (HOSPITAL_COMMUNITY): Payer: Medicare Other

## 2021-08-31 DIAGNOSIS — Z7982 Long term (current) use of aspirin: Secondary | ICD-10-CM | POA: Diagnosis not present

## 2021-08-31 DIAGNOSIS — R110 Nausea: Secondary | ICD-10-CM

## 2021-08-31 DIAGNOSIS — R202 Paresthesia of skin: Secondary | ICD-10-CM | POA: Insufficient documentation

## 2021-08-31 DIAGNOSIS — I10 Essential (primary) hypertension: Secondary | ICD-10-CM | POA: Insufficient documentation

## 2021-08-31 DIAGNOSIS — Z794 Long term (current) use of insulin: Secondary | ICD-10-CM | POA: Insufficient documentation

## 2021-08-31 DIAGNOSIS — E119 Type 2 diabetes mellitus without complications: Secondary | ICD-10-CM | POA: Insufficient documentation

## 2021-08-31 DIAGNOSIS — R61 Generalized hyperhidrosis: Secondary | ICD-10-CM

## 2021-08-31 DIAGNOSIS — R42 Dizziness and giddiness: Secondary | ICD-10-CM | POA: Diagnosis not present

## 2021-08-31 DIAGNOSIS — Z79899 Other long term (current) drug therapy: Secondary | ICD-10-CM | POA: Diagnosis not present

## 2021-08-31 DIAGNOSIS — R112 Nausea with vomiting, unspecified: Secondary | ICD-10-CM | POA: Diagnosis not present

## 2021-08-31 DIAGNOSIS — R0789 Other chest pain: Secondary | ICD-10-CM | POA: Diagnosis not present

## 2021-08-31 DIAGNOSIS — I2510 Atherosclerotic heart disease of native coronary artery without angina pectoris: Secondary | ICD-10-CM | POA: Diagnosis not present

## 2021-08-31 LAB — HEPATIC FUNCTION PANEL
ALT: 46 U/L — ABNORMAL HIGH (ref 0–44)
AST: 38 U/L (ref 15–41)
Albumin: 4.2 g/dL (ref 3.5–5.0)
Alkaline Phosphatase: 135 U/L — ABNORMAL HIGH (ref 38–126)
Bilirubin, Direct: 0.2 mg/dL (ref 0.0–0.2)
Indirect Bilirubin: 0.6 mg/dL (ref 0.3–0.9)
Total Bilirubin: 0.8 mg/dL (ref 0.3–1.2)
Total Protein: 7.4 g/dL (ref 6.5–8.1)

## 2021-08-31 LAB — CBC
HCT: 46.1 % (ref 39.0–52.0)
Hemoglobin: 16 g/dL (ref 13.0–17.0)
MCH: 32.3 pg (ref 26.0–34.0)
MCHC: 34.7 g/dL (ref 30.0–36.0)
MCV: 92.9 fL (ref 80.0–100.0)
Platelets: 243 10*3/uL (ref 150–400)
RBC: 4.96 MIL/uL (ref 4.22–5.81)
RDW: 13.7 % (ref 11.5–15.5)
WBC: 5.9 10*3/uL (ref 4.0–10.5)
nRBC: 0 % (ref 0.0–0.2)

## 2021-08-31 LAB — URINALYSIS, ROUTINE W REFLEX MICROSCOPIC
Bacteria, UA: NONE SEEN
Bilirubin Urine: NEGATIVE
Glucose, UA: NEGATIVE mg/dL
Ketones, ur: NEGATIVE mg/dL
Leukocytes,Ua: NEGATIVE
Nitrite: NEGATIVE
Protein, ur: NEGATIVE mg/dL
Specific Gravity, Urine: 1.005 (ref 1.005–1.030)
pH: 6 (ref 5.0–8.0)

## 2021-08-31 LAB — BASIC METABOLIC PANEL
Anion gap: 8 (ref 5–15)
BUN: 15 mg/dL (ref 8–23)
CO2: 27 mmol/L (ref 22–32)
Calcium: 9.1 mg/dL (ref 8.9–10.3)
Chloride: 102 mmol/L (ref 98–111)
Creatinine, Ser: 1.1 mg/dL (ref 0.61–1.24)
GFR, Estimated: >60 mL/min (ref >60)
Glucose, Bld: 103 mg/dL — ABNORMAL HIGH (ref 70–99)
Potassium: 3.6 mmol/L (ref 3.5–5.1)
Sodium: 137 mmol/L (ref 135–145)

## 2021-08-31 LAB — TROPONIN I (HIGH SENSITIVITY)
Troponin I (High Sensitivity): 3 ng/L (ref <18)
Troponin I (High Sensitivity): 4 ng/L (ref <18)

## 2021-08-31 LAB — LIPASE, BLOOD: Lipase: 41 U/L (ref 11–51)

## 2021-08-31 LAB — CBG MONITORING, ED: Glucose-Capillary: 104 mg/dL — ABNORMAL HIGH (ref 70–99)

## 2021-08-31 NOTE — ED Provider Notes (Signed)
?[facility] DEPT ?Provider Note ? ? ?CSN: [id number] ?Arrival date & time: 08/31/21  1237 ? ?  ? ?History ? ?Chief Complaint  ?Patient presents with  ? arm tingling  ? Dizziness  ? Nausea  ? ? ?Logan [name] is a 71 y.o. male. ? ?HPI ?71 year old male history of diabetes, coronary artery disease, hypertension presents today complaining of feeling nauseated, diaphoretic, and some left arm tingling earlier today.  He states the nausea lasted for several hours.  He is concerned that this is similar to symptoms he had with coronary artery disease in the past.  However he is not having any chest pain or dyspnea.  He denies any history of DVT or PE. ? ?  ? ?Home Medications ?Prior to Admission medications   ?Medication Sig Start Date End Date Taking? Authorizing Provider  ?amLODipine (NORVASC) 10 MG tablet Take 1 tablet by mouth once daily 07/18/21   Janith Lima, MD  ?aspirin 81 MG tablet Take 81 mg by mouth 2 (two) times daily.    [provider]  ?Continuous Blood Gluc Receiver (FREESTYLE LIBRE 14 DAY READER) [place]     [provider]  ?Continuous Blood Gluc Sensor (FREESTYLE LIBRE 14 DAY SENSOR) [place]     [provider]  ?insulin glargine (LANTUS) 100 UNIT/ML injection INJECT 20 UNITS SUBCUTANEOUSLY ONCE DAILY AT BEDTIME 05/15/21   Janith Lima, MD  ?insulin NPH-regular Human (NOVOLIN 70/30) (70-30) 100 UNIT/ML injection Inject 20 Units into the skin 2 (two) times daily with a meal. ?Patient taking differently: Inject 18 Units into the skin 2 (two) times daily with a meal. 12/29/16   Janith Lima, MD  ?irbesartan (AVAPRO) 300 MG tablet Take 1 tablet (300 mg total) by mouth daily. 09/21/20   Biagio Borg, MD  ?levothyroxine (SYNTHROID) 88 MCG tablet Take 1 tablet (88 mcg total) by mouth daily. 04/01/21   Janith Lima, MD  ?NEOMYCIN-POLYMYXIN-HYDROCORTISONE (CORTISPORIN) 1 % SOLN OTIC solution Place 3 drops into both ears every 8 (eight) hours. 07/15/21    Hoyt Koch, MD  ?PEG-KCl-NaCl-NaSulf-Na Asc-C (PLENVU) 140 g SOLR Take 1 kit by mouth as directed. Use coupon: BIN: 010272 PNC: CNRX Group: [id number] ID: [id number] 12/09/20   Jerene Bears, MD  ?rosuvastatin (CRESTOR) 10 MG tablet Take 1 tablet (10 mg total) by mouth daily. 06/17/21   Fay Records, MD  ?triamterene-hydrochlorothiazide (MAXZIDE-25) 37.5-25 MG tablet Take 0.5 tablets by mouth daily. 07/20/21   Fay Records, MD  ?   ? ?Allergies    ?Patient has no known allergies.   ? ?Review of Systems   ?Review of Systems  ?All other systems reviewed and are negative. ? ?Physical Exam ?Updated Vital Signs ?BP (!) 174/93   Pulse 90   Temp 98 ?F (36.7 ?C) (Oral)   Resp (!) 21   Ht 1.88 m (_0 )   Wt 78 kg   SpO2 97%   BMI 22.08 kg/m?  ?Physical Exam ?Vitals and nursing note reviewed.  ?Constitutional:   ?   Appearance: He is well-developed.  ?HENT:  ?   Head: Normocephalic and atraumatic.  ?   Right Ear: External ear normal.  ?   Left Ear: External ear normal.  ?   Nose: Nose normal.  ?Eyes:  ?   Extraocular Movements: Extraocular movements intact.  ?Neck:  ?   Trachea: No tracheal deviation.  ?Cardiovascular:  ?   Rate and Rhythm: Normal rate.  ?Pulmonary:  ?  Effort: Pulmonary effort is normal.  ?Abdominal:  ?   General: Abdomen is flat.  ?   Palpations: Abdomen is soft.  ?Musculoskeletal:     ?   General: Normal range of motion.  ?   Cervical back: Normal range of motion.  ?Skin: ?   General: Skin is warm and dry.  ?Neurological:  ?   Mental Status: He is alert and oriented to person, place, and time.  ?Psychiatric:     ?   Mood and Affect: Mood normal.     ?   Behavior: Behavior normal.  ? ? ?ED Results / Procedures / Treatments   ?Labs ?(all labs ordered are listed, but only abnormal results are displayed) ?Labs Reviewed  ?BASIC METABOLIC PANEL - Abnormal; Notable for the following components:  ?    Result Value  ? Glucose, Bld 103 (*)   ? All other components within normal limits   ?URINALYSIS, ROUTINE W REFLEX MICROSCOPIC - Abnormal; Notable for the following components:  ? Color, Urine STRAW (*)   ? Hgb urine dipstick SMALL (*)   ? All other components within normal limits  ?HEPATIC FUNCTION PANEL - Abnormal; Notable for the following components:  ? ALT 46 (*)   ? Alkaline Phosphatase 135 (*)   ? All other components within normal limits  ?CBG MONITORING, ED - Abnormal; Notable for the following components:  ? Glucose-Capillary 104 (*)   ? All other components within normal limits  ?CBC  ?LIPASE, BLOOD  ?TROPONIN I (HIGH SENSITIVITY)  ?TROPONIN I (HIGH SENSITIVITY)  ? ? ?EKG ?EKG Interpretation ? ?Date/Time:  Tuesday August 31 2021 12:43:30 EDT ?Ventricular Rate:  93 ?PR Interval:  155 ?QRS Duration: 84 ?QT Interval:  349 ?QTC Calculation: 435 ?R Axis:   -30 ?Text Interpretation: Sinus rhythm Left axis deviation Anterior infarct, old Confirmed by Pattricia Boss [phone]) on 08/31/2021 2:21:08 PM ? ?Radiology ?DG Chest Port 1 View ? ?Result Date: 08/31/2021 ?CLINICAL DATA:  Nausea EXAM: PORTABLE CHEST 1 VIEW COMPARISON:  None. FINDINGS: Cardiac and mediastinal contours are within normal limits. No focal pulmonary opacity. No pleural effusion or pneumothorax. No acute osseous abnormality. IMPRESSION: No acute cardiopulmonary process. Electronically Signed   By: Merilyn Baba M.D.   On: 08/31/2021 13:55   ? ?Procedures ?Procedures  ? ? ?Medications Ordered in ED ?Medications - No data to display ? ?ED Course/ Medical Decision Making/ A&P ?  ?                        ?Medical Decision Making ?71 year old male presents today complaining of episode of diaphoresis with some mild indigestion.  He is concerned because it is similar to how he felt when he had a cardiac event in 2001.  He has had radiology follow-up and has not had any problems since that time.  Here in the ED he has an EKG without any evidence of acute ischemia ?Troponin and repeat troponin are within normal limits other laboratory studies  including CBC and complete metabolic panel and lipase are within normal limits. ?Patient appears stable for discharge.  He is advised regarding return precautions and need for close follow-up and follow-up with cardiology. ? ?Amount and/or Complexity of Data Reviewed ?External Data Reviewed: notes. ?   Details: Cardiology office note from 05/11/2020 ? ?History of Present Illness: ?Logan [name] is a 71 y.o. male with a history of history of CAD. 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 He had nonobstructive disease at catheterization in 2004. He had a negative Myoview scan in 2009 with an ejection fraction of 49%.   ?Pt has also had syncope in past Known bradycardia. ?  ?Pt was last seen in cardiology in Dec 2020 ?  ?Since seen the pt has done well from a cardiac standpoint   His breathing is good   He denies CP   Hunts regularly   Dragged a deer over 100 yards ?Sugars have been high   Due to see B Balan soon. ?Labs: ordered. Decision-making details documented in ED Course. ?Radiology: ordered. ? ? ? ? ? ? ? ? ? ?Final Clinical Impression(s) / ED Diagnoses ?Final diagnoses:  ?None  ? ? ?Rx / DC Orders ?ED Discharge Orders   ? ? None  ? ?  ? ? ?  ?Pattricia Boss, MD ?08/31/21 1648 ? ?

## 2021-08-31 NOTE — ED Notes (Signed)
Pt states understanding of dc instructions, importance of follow up. Pt denies questions or concerns and declined transportation assistance upon dc. Pt ambulated w/ a steady gait w/o need for assistance. No belongings left in room upon dc. ? ?

## 2021-08-31 NOTE — Discharge Instructions (Signed)
No evidence of heart attack was seen on your labs or your heart tracing today. ?If you have return of symptoms or you begin having any chest pain or shortness of breath please return for evaluation ?Please call your cardiologist for follow-up tomorrow. ?

## 2021-08-31 NOTE — ED Triage Notes (Signed)
Patient c/o left arm tingling, lightheadedness, and nausea approx..  2 hours ago. Patient came to the ED saying he thinks he is having a heart attack. Patient states the same symptoms occurred when he had a heart blockage. ?

## 2021-09-08 ENCOUNTER — Other Ambulatory Visit: Payer: Self-pay | Admitting: *Deleted

## 2021-09-08 NOTE — Patient Outreach (Signed)
[facility]) Care Management ? ?09/08/2021 ? ?Verdie Drown ?1950/07/16 ?[phone] ? ? ?RN Health Coach attempted follow up outreach call to patient.  Patient was unavailable. HIPPA compliance voicemail message left with return callback number. ? ?Plan: ?RN will call patient again within 30 days. ? ?Johny Shock BSN RN ?[facility] ?[phone] ? ? ?

## 2021-09-14 ENCOUNTER — Ambulatory Visit: Payer: Medicare Other | Admitting: *Deleted

## 2021-09-20 ENCOUNTER — Inpatient Hospital Stay: Payer: Medicare Other | Attending: Hematology and Oncology

## 2021-09-20 ENCOUNTER — Other Ambulatory Visit: Payer: Self-pay

## 2021-09-20 LAB — CBC WITH DIFFERENTIAL (CANCER CENTER ONLY)
Abs Immature Granulocytes: 0.02 10*3/uL (ref 0.00–0.07)
Basophils Absolute: 0.1 10*3/uL (ref 0.0–0.1)
Basophils Relative: 1 %
Eosinophils Absolute: 0.6 10*3/uL — ABNORMAL HIGH (ref 0.0–0.5)
Eosinophils Relative: 10 %
HCT: 44.1 % (ref 39.0–52.0)
Hemoglobin: 15.1 g/dL (ref 13.0–17.0)
Immature Granulocytes: 0 %
Lymphocytes Relative: 38 %
Lymphs Abs: 2.2 10*3/uL (ref 0.7–4.0)
MCH: 31.5 pg (ref 26.0–34.0)
MCHC: 34.2 g/dL (ref 30.0–36.0)
MCV: 92.1 fL (ref 80.0–100.0)
Monocytes Absolute: 0.7 10*3/uL (ref 0.1–1.0)
Monocytes Relative: 12 %
Neutro Abs: 2.3 10*3/uL (ref 1.7–7.7)
Neutrophils Relative %: 39 %
Platelet Count: 193 10*3/uL (ref 150–400)
RBC: 4.79 MIL/uL (ref 4.22–5.81)
RDW: 13.6 % (ref 11.5–15.5)
WBC Count: 5.8 10*3/uL (ref 4.0–10.5)
nRBC: 0 % (ref 0.0–0.2)

## 2021-09-20 LAB — FERRITIN: Ferritin: 57 ng/mL (ref 24–336)

## 2021-09-21 ENCOUNTER — Telehealth: Payer: Self-pay | Admitting: Internal Medicine

## 2021-09-21 NOTE — Telephone Encounter (Signed)
Moved appt to 12 pm on 4/26. Patient verbalized agreement.  ?

## 2021-09-21 NOTE — Telephone Encounter (Signed)
Patient was returning call to Dr. Harrington Challenger and nurse regarding time slot on 4/26 at 8:00 to 12:00 pm on same day ?

## 2021-09-22 ENCOUNTER — Encounter: Payer: Self-pay | Admitting: Internal Medicine

## 2021-09-22 ENCOUNTER — Ambulatory Visit (INDEPENDENT_AMBULATORY_CARE_PROVIDER_SITE_OTHER): Payer: Medicare Other | Admitting: Internal Medicine

## 2021-09-22 DIAGNOSIS — H60393 Other infective otitis externa, bilateral: Secondary | ICD-10-CM | POA: Diagnosis not present

## 2021-09-22 MED ORDER — AZITHROMYCIN 250 MG PO TABS: ORAL_TABLET | ORAL | 0 refills | Status: AC

## 2021-09-22 MED ORDER — CIPROFLOXACIN-DEXAMETHASONE 0.3-0.1 % OT SUSP: 4.0000 [drp] | Freq: Two times a day (BID) | OTIC | 0 refills | Status: DC

## 2021-09-22 NOTE — Progress Notes (Signed)
? ?  Subjective:  ? ?Patient ID: Logan [name], male    DOB: [birth year], 71 y.o.   MRN: [id number] ? ?HPI ?The patient is a 71 YO man coming in for recurrent ear issues.  ? ?Review of Systems  ?Constitutional: Negative.   ?HENT:  Positive for ear discharge, ear pain and hearing loss.   ?Eyes: Negative.   ?Respiratory:  Negative for cough, chest tightness and shortness of breath.   ?Cardiovascular:  Negative for chest pain, palpitations and leg swelling.  ?Gastrointestinal:  Negative for abdominal distention, abdominal pain, constipation, diarrhea, nausea and vomiting.  ?Musculoskeletal: Negative.   ?Skin: Negative.   ?Neurological: Negative.   ?Psychiatric/Behavioral: Negative.    ? ?Objective:  ?Physical Exam ?Constitutional:   ?   Appearance: He is well-developed.  ?HENT:  ?   Head: Normocephalic and atraumatic.  ?   Ears:  ?   Comments: Bilateral TM bulging cloudy fluid no rupture. ?Cardiovascular:  ?   Rate and Rhythm: Normal rate and regular rhythm.  ?Pulmonary:  ?   Effort: Pulmonary effort is normal. No respiratory distress.  ?   Breath sounds: Normal breath sounds. No wheezing or rales.  ?Abdominal:  ?   General: Bowel sounds are normal. There is no distension.  ?   Palpations: Abdomen is soft.  ?   Tenderness: There is no abdominal tenderness. There is no rebound.  ?Musculoskeletal:  ?   Cervical back: Normal range of motion.  ?Skin: ?   General: Skin is warm and dry.  ?Neurological:  ?   Mental Status: He is alert and oriented to person, place, and time.  ?   Coordination: Coordination normal.  ? ? ?Vitals:  ? 09/22/21 0858  ?BP: 122/76  ?Pulse: 83  ?Resp: 18  ?SpO2: 98%  ?Weight: 177 lb (80.3 kg)  ?Height: '6\' 2"'$  (1.88 m)  ? ? ?This visit occurred during the SARS-CoV-2 public health emergency.  Safety protocols were in place, including screening questions prior to the visit, additional usage of staff PPE, and extensive cleaning of exam room while observing appropriate contact time as indicated for  disinfecting solutions.  ? ?Assessment & Plan:  ? ?

## 2021-09-22 NOTE — Patient Instructions (Signed)
We have sent in azithromycin to take 2 pills daily,then 1 pill daily for 2 weeks. ? ?We have also sent in ciprodex to use 4 drops twice a day for 1 week in both ears. ?

## 2021-09-24 ENCOUNTER — Encounter: Payer: Self-pay | Admitting: Internal Medicine

## 2021-09-24 NOTE — Assessment & Plan Note (Signed)
Rx azithromycin 2 week course and ciprodex ear drops. If no improvement needs referral to ENT. ?

## 2021-09-26 ENCOUNTER — Other Ambulatory Visit: Payer: Self-pay | Admitting: Internal Medicine

## 2021-09-26 DIAGNOSIS — E039 Hypothyroidism, unspecified: Secondary | ICD-10-CM

## 2021-09-26 NOTE — Telephone Encounter (Signed)
Please refill as per office routine med refill policy (all routine meds to be refilled for 3 mo or monthly (per pt preference) up to one year from last visit, then month to month grace period for 3 mo, then further med refills will have to be denied) ? ?

## 2021-09-27 NOTE — Progress Notes (Signed)
? ?Cardiology Office Note ? ? ?Date:  09/29/2021  ? ?ID:  Logan [name], DOB [birth year], MRN [id number] ? ?PCP:  Janith Lima, MD  ?Cardiologist:   Dorris Carnes, MD  ? ? ?F?U of CAD and HTN   ?  ?History of Present Illness: ?Logan [name] is a 71 y.o. male with a history of history of CAD. 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 He had nonobstructive disease at catheterization in 2004. He had a negative Myoview scan in 2009 with an ejection fraction of 49%.   ?Pt has also had syncope in past Known bradycardia. ? ?Pt was last seen in cardiology in Dec 2021 ? ?Since seen the pt remains active   Walking, working outside     ?Denies CP   Breathing is OK   Can walk 5 miles at at time    ?He did have a spell of diaphoresis    Was in driveway at time   It was cool outside   Had some tingling in arm   Went to ED  Trop neg  SUgar OK    ?PCP sugg may be related to ear problems   HE is having a very hard time hearring    (has tubes in ears that were impacted)   ? ? ?Current Meds  ?Medication Sig  ? amLODipine (NORVASC) 10 MG tablet Take 1 tablet by mouth once daily  ? aspirin 81 MG tablet Take 81 mg by mouth 2 (two) times daily.  ? azithromycin (ZITHROMAX) 250 MG tablet Take by mouth daily. Take 2 tabs on day 1, then 1 tablet on days 2 through 14  ? ciprofloxacin-dexamethasone (CIPRODEX) OTIC suspension Place 4 drops into both ears 2 (two) times daily.  ? Continuous Blood Gluc Receiver (FREESTYLE LIBRE 14 DAY READER) [place]   ? Continuous Blood Gluc Sensor (FREESTYLE LIBRE 14 DAY SENSOR) MISC   ? insulin glargine (LANTUS) 100 UNIT/ML injection INJECT 20 UNITS SUBCUTANEOUSLY ONCE DAILY AT BEDTIME  ? insulin NPH-regular Human (NOVOLIN 70/30) (70-30) 100 UNIT/ML injection Inject 20 Units into the skin 2 (two) times daily with a meal. (Patient taking differently: Inject 18 Units into the skin 2 (two) times daily with a meal.)  ? irbesartan (AVAPRO) 300 MG tablet Take 1 tablet (300 mg total) by mouth daily. Overdue  for Annual appt w/labs must see provider for future refills+  ? levothyroxine (SYNTHROID) 88 MCG tablet Take 1 tablet by mouth once daily  ? rosuvastatin (CRESTOR) 10 MG tablet Take 1 tablet (10 mg total) by mouth daily.  ? triamterene-hydrochlorothiazide (MAXZIDE-25) 37.5-25 MG tablet Take 0.5 tablets by mouth daily.  ? ? ? ?Allergies:   Patient has no known allergies.  ? ?Past Medical History:  ?Diagnosis Date  ? Aortic atherosclerosis ([place])   ? Cataract   ? Coronary artery disease involving native coronary artery of native heart without angina pectoris 11/01/2007  ? Last Cath '04: LAD w/ 60% long lesion; 1st Dx 70% ostial; 2nd Dx  70% ostial; Cx 30% ostial; RCA-dominant multiple 30-40% lesions; PDA 30% lesions  Last nuclear stress March '09 - negative for ischemia   ? Diabetes mellitus   ? Diabetic retinopathy   ? Hearing loss   ? Hemochromatosis   ? Hyperlipidemia   ? Hypertension   ? Tubular adenoma of colon   ? ? ?Past Surgical History:  ?Procedure Laterality Date  ? 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 Procedure: Right Great Toe Amputation;  Surgeon: Newt Minion, MD;  Location: [facility];  Service: Orthopedics;  Laterality: Right;  Right Great Toe Amputation  ? INNER EAR SURGERY    ? right  ? ? ? ?Social History:  The patient  reports that he has never smoked. He has never used smokeless tobacco. He reports current alcohol use. He reports that he does not use drugs.  ? ?Family History:  The patient's family history includes Alcohol abuse in his father; Asthma in his mother; Cancer in his father; Colon cancer in his maternal uncle; Emphysema in his mother; Heart failure in his mother; Stroke in his father.  ? ? ?ROS:  Please see the history of present illness. All other systems are reviewed and  Negative to the above problem except as noted.  ? ? ?PHYSICAL EXAM: ?VS:  BP 140/70   Pulse 81   Ht '6\' 2"'$  (1.88 m)   Wt 178 lb 12.8 oz (81.1 kg)   SpO2 97%   BMI 22.96 kg/m?   ? ?GEN: Thin 72 year old, in no acute  distress Very hard of hearing   ?HEENT: normal  ?Neck: JVP is normal   No carotid bruits ?Cardiac: RRR; no murmurs.  No LE edema  ?Respiratory:  clear to auscultation bilaterally,  ?GI: soft, nontender, nondistended, + BS  No hepatomegaly  ?Skin: warm and dry, no rash ?Neuro:  Strength and sensation are intact ?Psych: euthymic mood, full affect ? ? ?EKG:  EKG is  ordered today.  On 09/03/21  SR 93   ?Lipid Panel ?   ?Component Value Date/Time  ? CHOL 94 06/17/2020 0830  ? TRIG 54.0 06/17/2020 0830  ? HDL 47.60 06/17/2020 0830  ? CHOLHDL 2 06/17/2020 0830  ? VLDL 10.8 06/17/2020 0830  ? LDLCALC 36 06/17/2020 0830  ? ?  ? ?Wt Readings from Last 3 Encounters:  ?09/29/21 178 lb 12.8 oz (81.1 kg)  ?09/22/21 177 lb (80.3 kg)  ?08/31/21 172 lb (78 kg)  ?  ? ? ?ASSESSMENT AND PLAN: ? ?1  CAD Pt continues to do well   Remains active, asymptomatic ? ?2  HTN   BP is a little increaed but it has been better    110s to 120s  ? ?3  HL  LDL 36   HDL 56  (NOv 2022) ? ?4  Hx bradycardia   No significant documented spells  ? ?5.  DM   Reviewed diet, sugars   Due to see Dr Chalmers Cater later this week   A1C in OCt 6.9   ? ?6  Hearing   WIll try to get appt at ENT   ? ?F/u 1 year sooner with problems. ? ?Current medicines are reviewed at length with the patient today.  The patient does not have concerns regarding medicines. ? ?Signed, ?Dorris Carnes, MD  ?09/29/2021 8:35 AM    ?[facility] ?[address], [place], [place]  [address] ?Phone: [phone]; Fax: [phone]  ? ? ?

## 2021-09-29 ENCOUNTER — Encounter: Payer: Self-pay | Admitting: Internal Medicine

## 2021-09-29 ENCOUNTER — Ambulatory Visit: Payer: Medicare Other | Admitting: Internal Medicine

## 2021-09-29 ENCOUNTER — Encounter: Payer: Self-pay | Admitting: Hematology and Oncology

## 2021-09-29 ENCOUNTER — Ambulatory Visit (INDEPENDENT_AMBULATORY_CARE_PROVIDER_SITE_OTHER): Payer: Medicare Other | Admitting: Internal Medicine

## 2021-09-29 VITALS — BP 140/70 | HR 81 | Ht 74.0 in | Wt 178.8 lb

## 2021-09-29 DIAGNOSIS — I950 Idiopathic hypotension: Secondary | ICD-10-CM

## 2021-09-29 DIAGNOSIS — I10 Essential (primary) hypertension: Secondary | ICD-10-CM | POA: Diagnosis not present

## 2021-09-29 MED ORDER — ROSUVASTATIN CALCIUM 10 MG PO TABS: 10.0000 mg | ORAL_TABLET | Freq: Every day | ORAL | 3 refills | Status: DC

## 2021-09-29 MED ORDER — TRIAMTERENE-HCTZ 37.5-25 MG PO TABS: 0.5000 | ORAL_TABLET | Freq: Every day | ORAL | 3 refills | Status: DC

## 2021-09-29 NOTE — Patient Instructions (Signed)
Medication Instructions:  ? ?*If you need a refill on your cardiac medications before your next appointment, please call your pharmacy* ? ? ?Lab Work: ? ?If you have labs (blood work) drawn today and your tests are completely normal, you will receive your results only by: ?MyChart Message (if you have MyChart) OR ?A paper copy in the mail ?If you have any lab test that is abnormal or we need to change your treatment, we will call you to review the results. ? ? ?Testing/Procedures: ? ? ? ?Follow-Up: ?At [facility], you and your health needs are our priority.  As part of our continuing mission to provide you with exceptional heart care, we have created designated Provider Care Teams.  These Care Teams include your primary Cardiologist (physician) and Advanced Practice Providers (APPs -  Physician Assistants and Nurse Practitioners) who all work together to provide you with the care you need, when you need it. ? ?We recommend signing up for the patient portal called "MyChart".  Sign up information is provided on this After Visit Summary.  MyChart is used to connect with patients for Virtual Visits (Telemedicine).  Patients are able to view lab/test results, encounter notes, upcoming appointments, etc.  Non-urgent messages can be sent to your provider as well.   ?To learn more about what you can do with MyChart, go to [email or web address].   ? ?Your next appointment:   ?1 year(s) ? ?The format for your next appointment:   ?In Person ? ?Provider:   ?Paula Ross, MD   ? ? ?Other Instructions ? ? ?Important Information About Sugar ? ? ? ? ?  ?

## 2021-10-05 ENCOUNTER — Other Ambulatory Visit: Payer: Self-pay | Admitting: *Deleted

## 2021-10-05 NOTE — Patient Outreach (Signed)
[place] [facility]) Care Management ? ?10/05/2021 ? ?Logan [name] ?[birth year] ?[id number] ? ? ?RN Health Coach attempted follow up outreach call to patient.  Patient was unavailable. HIPPA compliance voicemail message left with return callback number. ? ?Plan: ?RN will call patient again within 30 days. ? ?Johny Shock BSN RN ?[place] Management ?[phone] ? ?

## 2021-10-10 ENCOUNTER — Other Ambulatory Visit: Payer: Self-pay | Admitting: Internal Medicine

## 2021-10-10 DIAGNOSIS — I10 Essential (primary) hypertension: Secondary | ICD-10-CM

## 2021-10-29 ENCOUNTER — Other Ambulatory Visit: Payer: Self-pay | Admitting: Internal Medicine

## 2021-11-10 DIAGNOSIS — E11319 Type 2 diabetes mellitus with unspecified diabetic retinopathy without macular edema: Secondary | ICD-10-CM | POA: Diagnosis not present

## 2021-11-10 DIAGNOSIS — I2510 Atherosclerotic heart disease of native coronary artery without angina pectoris: Secondary | ICD-10-CM | POA: Diagnosis not present

## 2021-11-10 DIAGNOSIS — E039 Hypothyroidism, unspecified: Secondary | ICD-10-CM | POA: Diagnosis not present

## 2021-11-10 DIAGNOSIS — E109 Type 1 diabetes mellitus without complications: Secondary | ICD-10-CM | POA: Diagnosis not present

## 2021-11-10 DIAGNOSIS — E7800 Pure hypercholesterolemia, unspecified: Secondary | ICD-10-CM | POA: Diagnosis not present

## 2021-11-10 DIAGNOSIS — E1140 Type 2 diabetes mellitus with diabetic neuropathy, unspecified: Secondary | ICD-10-CM | POA: Diagnosis not present

## 2021-11-10 DIAGNOSIS — I10 Essential (primary) hypertension: Secondary | ICD-10-CM | POA: Diagnosis not present

## 2021-11-10 LAB — HEMOGLOBIN A1C: Hemoglobin A1C: 8.2

## 2021-11-14 ENCOUNTER — Other Ambulatory Visit: Payer: Self-pay | Admitting: Internal Medicine

## 2021-11-14 DIAGNOSIS — I10 Essential (primary) hypertension: Secondary | ICD-10-CM

## 2021-11-25 ENCOUNTER — Other Ambulatory Visit: Payer: Self-pay | Admitting: *Deleted

## 2021-11-25 NOTE — Patient Instructions (Signed)
Visit Information  Thank you for taking time to visit with me today. Please don't hesitate to contact me if I can be of assistance to you before our next scheduled telephone appointment.  Following are the goals we discussed today:  (Copy and paste patient goals from clinical care plan here)  Our next appointment is by telephone on February 28, 2022  Please call  Johny Shock RN [phone] if you need to cancel or reschedule your appointment.   Please call the Suicide and Crisis Lifeline: 988 if you are experiencing a Mental Health or [place] or need someone to talk to.  The patient verbalized understanding of instructions, educational materials, and care plan provided today and agreed to receive a mailed copy of patient instructions, educational materials, and care plan.   Telephone follow up appointment with care management team member scheduled for: The patient has been provided with contact information for the care management team and has been advised to call with any health related questions or concerns.   [place] Care Management [phone]

## 2021-11-25 NOTE — Patient Outreach (Signed)
[place] [facility]) Care Management [place] Note   11/25/2021 Name:  Logan [name] MRN:  [id number] DOB:  12-Feb-1951  Summary:  Fasting blood sugar is 99 today.  A1C  increased from 6.9 to 8.2. Appetite is good.He has not been managing his portion control.  He eats chicken mostly. Baked fish and salads. He stated limited amount of beef and pork.He is having frequent episodes of hypoglycemia. He stated that it has gotten as low as 50. The low range is mostly 65-70. The patient is not having any symptoms with  his hypoglycemia. Patient has free style Elenor Legato , the alarms is what notifies him.  He is exercising walking 60 minutes 3 x a week.   Recommendations/Changes made from today's visit: Portion control Monitor Blood Sugars Talk with [facility] to Help schedule eye surgery   Subjective: Logan [name] is an 71 y.o. year old male who is a primary patient of Janith Lima, MD. The care management team was consulted for assistance with care management and/or care coordination needs.    RN Health Coach completed Telephone Visit today.   Objective:  Medications Reviewed Today     Reviewed by Nickolas Madrid, CMA (Certified Medical Assistant) on 09/29/21 at [place] List Status: <None>   Medication Order Taking? Sig Documenting Provider Last Dose Status Informant  amLODipine (NORVASC) 10 MG tablet [id number] Yes Take 1 tablet by mouth once daily Janith Lima, MD Taking Active   aspirin 81 MG tablet [id number] Yes Take 81 mg by mouth 2 (two) times daily. [provider] Taking Active Self           Med Note Olena Heckle, MICHELE   Wed Aug 31, 2016  3:45 PM)    azithromycin (ZITHROMAX) 250 MG tablet [id number] Yes Take by mouth daily. Take 2 tabs on day 1, then 1 tablet on days 2 through 14 [provider] Taking Active   ciprofloxacin-dexamethasone (CIPRODEX) OTIC suspension [id number] Yes Place 4 drops into both ears 2 (two) times daily. Hoyt Koch, MD Taking Active   Continuous Blood Gluc Receiver (FREESTYLE LIBRE [address] [place] [id number] Yes  [provider] Taking Active   Continuous Blood Gluc Sensor (FREESTYLE LIBRE [place]) [place] [id number] Yes  [provider] Taking Active   insulin glargine (LANTUS) 100 UNIT/ML injection [id number] Yes INJECT 20 UNITS SUBCUTANEOUSLY ONCE DAILY AT BEDTIME Janith Lima, MD Taking Active   insulin NPH-regular Human (NOVOLIN 70/30) (70-30) 100 UNIT/ML injection [id number] Yes Inject 20 Units into the skin 2 (two) times daily with a meal.  Patient taking differently: Inject 18 Units into the skin 2 (two) times daily with a meal.   Janith Lima, MD Taking Active            Med Note Delice Bison, DANIEL C   Tue Feb 09, 2021 11:23 AM)    irbesartan (AVAPRO) 300 MG tablet [id number] Yes Take 1 tablet (300 mg total) by mouth daily. Overdue for Annual appt w/labs must see provider for future refills+ Janith Lima, MD Taking Active   levothyroxine (SYNTHROID) 88 MCG tablet [id number] Yes Take 1 tablet by mouth once daily Janith Lima, MD Taking Active   PEG-KCl-NaCl-NaSulf-Na Asc-C (PLENVU) 140 g SOLR [id number] No Take 1 kit by mouth as directed. Use coupon: BIN: 147092 [facility]: CNRX Group: [id number] ID: [id number]  Patient not taking: Reported on 09/29/2021   Pyrtle, Lajuan Lines, MD Not Taking Active  rosuvastatin (CRESTOR) 10 MG tablet [id number] Yes Take 1 tablet (10 mg total) by mouth daily. Fay Records, MD Taking Active   triamterene-hydrochlorothiazide [facility]) 37.5-25 MG tablet [id number] Yes Take 0.5 tablets by mouth daily. Fay Records, MD Taking Active              SDOH:  (Social Determinants of Health) assessments and interventions performed:  SDOH Interventions    Flowsheet Row Most Recent Value  SDOH Interventions   Food Insecurity Interventions Intervention Not Indicated  Housing Interventions Intervention Not Indicated  Transportation Interventions  Intervention Not Indicated       Care Plan  Review of patient past medical history, allergies, medications, health status, including review of consultants reports, laboratory and other test data, was performed as part of comprehensive evaluation for care management services.   Care Plan : RN Care Manager Plan of Care  Updates made by Ksenia Kunz, Eppie Gibson, RN since 11/25/2021 12:00 AM     Problem: Knowledge Deficit Related to Diabetes and Care Coordination Needs      Long-Range Goal: Development Plan of Care For Manangement of Diabetes   Start Date: 06/09/2021  Expected End Date: 06/04/2022  Priority: High  Note:   Current Barriers:  Knowledge Deficits related to plan of care for management of DMII   RNCM Clinical Goal(s):  Patient will verbalize understanding of plan for management of DMII as evidenced by continuation of monitoring blood sugars and adhering diabetic diet  through collaboration with RN Care manager, provider, and care team.   Interventions: Inter-disciplinary care team collaboration (see longitudinal plan of care) Evaluation of current treatment plan related to  self management and patient's adherence to plan as established by provider  Patient Goals/Self-Care Activities: Take medications as prescribed   Attend all scheduled provider appointments Call pharmacy for medication refills 3-7 days in advance of running out of medications Perform all self care activities independently  Perform IADL's (shopping, preparing meals, housekeeping, managing finances) independently Call provider office for new concerns or questions  call the Suicide and Crisis Lifeline: 988 if experiencing a Mental Health or [place]  keep appointment with eye doctor schedule appointment with eye doctor check blood sugar at prescribed times: 4 times daily and when you have symptoms of low or high blood sugar check feet daily for cuts, sores or redness take the blood sugar meter  to all doctor visits trim toenails straight across drink 6 to 8 glasses of water each day set a realistic goal   [id number] Fasting blood sugar is 99 today. His A1C has increased from 6.9 to 8.2. Per patient his appetite is good.He has not been managing his portion control.  He eats chicken mostly. Baked fish and salads. He stated limited amount of beef and pork. Patient is having frequent episodes of hypoglycemia. He stated that it has gotten as low as 50. The low range is mostly 65-70. The patient is not having any symptoms with  his hypoglycemia. Patient has free style libre and the alarms is what notifies him.  He is exercising walking 60 minutes 3 x a week.  Plan:  Portion control Monitor blood sugars Talk with [facility] to help schedule eye surgery       Plan: Telephone follow up appointment with care management team member scheduled for:  February 28, 2022 The patient has been provided with contact information for the care management team and has been advised to call with any health related questions or concerns.  [place] Care Management [phone]

## 2021-11-29 ENCOUNTER — Encounter: Payer: Self-pay | Admitting: Internal Medicine

## 2021-11-29 ENCOUNTER — Ambulatory Visit (INDEPENDENT_AMBULATORY_CARE_PROVIDER_SITE_OTHER): Payer: Medicare Other | Admitting: Internal Medicine

## 2021-11-29 VITALS — BP 142/78 | HR 72 | Temp 97.9°F | Resp 16 | Ht 74.0 in | Wt 180.0 lb

## 2021-11-29 DIAGNOSIS — E785 Hyperlipidemia, unspecified: Secondary | ICD-10-CM | POA: Diagnosis not present

## 2021-11-29 DIAGNOSIS — N182 Chronic kidney disease, stage 2 (mild): Secondary | ICD-10-CM | POA: Diagnosis not present

## 2021-11-29 DIAGNOSIS — I10 Essential (primary) hypertension: Secondary | ICD-10-CM

## 2021-11-29 DIAGNOSIS — E039 Hypothyroidism, unspecified: Secondary | ICD-10-CM

## 2021-11-29 DIAGNOSIS — E109 Type 1 diabetes mellitus without complications: Secondary | ICD-10-CM | POA: Diagnosis not present

## 2021-11-29 DIAGNOSIS — N400 Enlarged prostate without lower urinary tract symptoms: Secondary | ICD-10-CM

## 2021-11-29 LAB — URINALYSIS, ROUTINE W REFLEX MICROSCOPIC
Bilirubin Urine: NEGATIVE
Ketones, ur: NEGATIVE
Leukocytes,Ua: NEGATIVE
Nitrite: NEGATIVE
Specific Gravity, Urine: 1.01 (ref 1.000–1.030)
Total Protein, Urine: NEGATIVE
Urine Glucose: NEGATIVE
Urobilinogen, UA: 1 (ref 0.0–1.0)
pH: 7 (ref 5.0–8.0)

## 2021-11-29 LAB — LIPID PANEL
Cholesterol: 120 mg/dL (ref 0–200)
HDL: 52.6 mg/dL (ref >39.00)
LDL Cholesterol: 55 mg/dL (ref 0–99)
NonHDL: 67.88
Total CHOL/HDL Ratio: 2
Triglycerides: 64 mg/dL (ref 0.0–149.0)
VLDL: 12.8 mg/dL (ref 0.0–40.0)

## 2021-11-29 LAB — MICROALBUMIN / CREATININE URINE RATIO
Creatinine,U: 57.2 mg/dL
Microalb Creat Ratio: 14 mg/g (ref 0.0–30.0)
Microalb, Ur: 8 mg/dL — ABNORMAL HIGH (ref 0.0–1.9)

## 2021-11-29 LAB — TSH: TSH: 1.25 u[IU]/mL (ref 0.35–5.50)

## 2021-11-29 LAB — PSA: PSA: 2.4 ng/mL (ref 0.10–4.00)

## 2021-12-20 ENCOUNTER — Other Ambulatory Visit: Payer: Self-pay

## 2021-12-20 ENCOUNTER — Inpatient Hospital Stay: Payer: Medicare Other | Attending: Hematology and Oncology

## 2021-12-20 ENCOUNTER — Inpatient Hospital Stay (HOSPITAL_BASED_OUTPATIENT_CLINIC_OR_DEPARTMENT_OTHER): Payer: Medicare Other | Admitting: Hematology and Oncology

## 2021-12-20 DIAGNOSIS — E785 Hyperlipidemia, unspecified: Secondary | ICD-10-CM | POA: Insufficient documentation

## 2021-12-20 DIAGNOSIS — I10 Essential (primary) hypertension: Secondary | ICD-10-CM | POA: Diagnosis not present

## 2021-12-20 DIAGNOSIS — Z79899 Other long term (current) drug therapy: Secondary | ICD-10-CM | POA: Insufficient documentation

## 2021-12-20 DIAGNOSIS — Z800 Family history of malignant neoplasm of digestive organs: Secondary | ICD-10-CM | POA: Diagnosis not present

## 2021-12-20 DIAGNOSIS — E119 Type 2 diabetes mellitus without complications: Secondary | ICD-10-CM | POA: Diagnosis not present

## 2021-12-20 DIAGNOSIS — I2510 Atherosclerotic heart disease of native coronary artery without angina pectoris: Secondary | ICD-10-CM | POA: Insufficient documentation

## 2021-12-20 DIAGNOSIS — Z794 Long term (current) use of insulin: Secondary | ICD-10-CM | POA: Insufficient documentation

## 2021-12-20 DIAGNOSIS — Z86010 Personal history of colonic polyps: Secondary | ICD-10-CM | POA: Diagnosis not present

## 2021-12-20 DIAGNOSIS — Z79890 Hormone replacement therapy (postmenopausal): Secondary | ICD-10-CM | POA: Diagnosis not present

## 2021-12-20 DIAGNOSIS — E1136 Type 2 diabetes mellitus with diabetic cataract: Secondary | ICD-10-CM | POA: Diagnosis not present

## 2021-12-20 DIAGNOSIS — Z7982 Long term (current) use of aspirin: Secondary | ICD-10-CM | POA: Insufficient documentation

## 2021-12-20 DIAGNOSIS — I700 Atherosclerosis of aorta: Secondary | ICD-10-CM | POA: Diagnosis not present

## 2021-12-20 DIAGNOSIS — E11319 Type 2 diabetes mellitus with unspecified diabetic retinopathy without macular edema: Secondary | ICD-10-CM | POA: Diagnosis not present

## 2021-12-20 LAB — CBC WITH DIFFERENTIAL (CANCER CENTER ONLY)
Abs Immature Granulocytes: 0.01 10*3/uL (ref 0.00–0.07)
Basophils Absolute: 0.1 10*3/uL (ref 0.0–0.1)
Basophils Relative: 1 %
Eosinophils Absolute: 0.5 10*3/uL (ref 0.0–0.5)
Eosinophils Relative: 8 %
HCT: 44.6 % (ref 39.0–52.0)
Hemoglobin: 15.5 g/dL (ref 13.0–17.0)
Immature Granulocytes: 0 %
Lymphocytes Relative: 37 %
Lymphs Abs: 2.5 10*3/uL (ref 0.7–4.0)
MCH: 31.8 pg (ref 26.0–34.0)
MCHC: 34.8 g/dL (ref 30.0–36.0)
MCV: 91.4 fL (ref 80.0–100.0)
Monocytes Absolute: 0.8 10*3/uL (ref 0.1–1.0)
Monocytes Relative: 11 %
Neutro Abs: 2.9 10*3/uL (ref 1.7–7.7)
Neutrophils Relative %: 43 %
Platelet Count: 212 10*3/uL (ref 150–400)
RBC: 4.88 MIL/uL (ref 4.22–5.81)
RDW: 13.2 % (ref 11.5–15.5)
WBC Count: 6.8 10*3/uL (ref 4.0–10.5)
nRBC: 0 % (ref 0.0–0.2)

## 2021-12-20 LAB — FERRITIN: Ferritin: 54 ng/mL (ref 24–336)

## 2021-12-20 NOTE — Progress Notes (Signed)
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 Genetic testing shows patient is homozygous for C282Y 01/13/2021: establish care with Dr. Lorenso Courier  01/19/2021: Ferritin 129, Hgb 14.6 02/02/2021: Ferritin 78, Hgb 13.5 02/16/2021: Ferritin 53, Hgb 13.6 03/03/2022: last phlebotomy. Subsequent HELD as patient was at target.  06/22/2021: ferritin 36, Hgb 16.3 09/20/2021: ferritin 57, Hgb 15.5  Interval History:  Logan [name] 71 y.o. male with medical history significant for hereditary hemachromatosis presents for a follow up visit. The patient's last visit was on 06/22/2021. In the interim since the last visit he has not required any further phlebotomy.  On exam today Logan [name] reports he has been "staying cool and avoiding doctors".  He notes that he walked 4 miles yesterday in the early a.m. to avoid the heat.  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  He notes that he did have an infection at the end of January but subsequently improved quite quickly.  He notes that he continues to feel quite well and is not been having any issues with shortness of breath, bleeding, or bruising.  He currently denies any fevers, chills, sweats, nausea, vomiting or diarrhea.    A full 10 point ROS is listed below.  MEDICAL HISTORY:  Past Medical History:  Diagnosis Date   Aortic atherosclerosis ([place])    Cataract    Coronary artery disease involving native coronary artery of native heart without  angina pectoris 11/01/2007   Last Cath '04: LAD w/ 60% long lesion; 1st Dx 70% ostial; 2nd Dx  70% ostial; Cx 30% ostial; RCA-dominant multiple 30-40% lesions; PDA 30% lesions  Last nuclear stress March '09 - negative for ischemia    Diabetes mellitus    Diabetic retinopathy    Hearing loss    Hemochromatosis    Hyperlipidemia    Hypertension    Tubular adenoma of colon     SURGICAL HISTORY: Past Surgical History:  Procedure Laterality Date   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   Procedure: Right Great Toe Amputation;  Surgeon: Newt Minion, MD;  Location: [facility];  Service: Orthopedics;  Laterality: Right;  Right Great Toe Amputation   INNER EAR SURGERY     right    SOCIAL HISTORY: Social History   Socioeconomic History   Marital status: Widowed    Spouse name: Not on file   Number of children: 1   Years of education: 69   Highest education level: Not on file  Occupational History   Occupation: retired    Fish farm manager: [employer]  Tobacco Use   Smoking status: Never   Smokeless tobacco: Never  Vaping Use   Vaping Use: Never used  Substance and Sexual Activity   Alcohol use: Yes    Comment: occ beer but very rare   Drug use: No   Sexual activity: Yes    Partners: Female  Other Topics Concern   Not on file  Social History Narrative   HSG. Married - '77 - '04. 1 son '80. Work - semi-retired. Has a girlfriend.  Social Determinants of Health   Financial Resource Strain: Low Risk  (03/19/2020)   Overall Financial Resource Strain (CARDIA)    Difficulty of Paying Living Expenses: Not hard at all  Food Insecurity: No Food Insecurity (11/25/2021)   Hunger Vital Sign    Worried About Running Out of Food in the Last Year: Never true    Ran Out of Food in the Last Year: Never true  Transportation Needs: No Transportation Needs (11/25/2021)   PRAPARE - Hydrologist (Medical): No    Lack of Transportation (Non-Medical): No  Physical Activity:  Sufficiently Active (03/19/2020)   Exercise Vital Sign    Days of Exercise per Week: 3 days    Minutes of Exercise per Session: 60 min  Stress: Not on file  Social Connections: Not on file  Intimate Partner Violence: Not At Risk (03/19/2020)   Humiliation, Afraid, Rape, and Kick questionnaire    Fear of Current or Ex-Partner: No    Emotionally Abused: No    Physically Abused: No    Sexually Abused: No    FAMILY HISTORY: Family History  Problem Relation Age of Onset   Asthma Mother    Heart failure Mother    Emphysema Mother    Alcohol abuse Father    Cancer Father        prostate   Stroke Father    Colon cancer Maternal Uncle        dx'd 42   Heart disease Neg Hx    Rectal cancer Neg Hx    Stomach cancer Neg Hx     ALLERGIES:  has No Known Allergies.  MEDICATIONS:  Current Outpatient Medications  Medication Sig Dispense Refill   amLODipine (NORVASC) 10 MG tablet Take 1 tablet by mouth once daily 90 tablet 0   aspirin 81 MG tablet Take 81 mg by mouth 2 (two) times daily.     Continuous Blood Gluc Receiver (FREESTYLE LIBRE 14 DAY READER) DEVI      Continuous Blood Gluc Sensor (FREESTYLE LIBRE 14 DAY SENSOR) MISC      insulin glargine (LANTUS) 100 UNIT/ML injection INJECT 20 UNITS SUBCUTANEOUSLY ONCE DAILY AT BEDTIME 30 mL 0   insulin NPH-regular Human (NOVOLIN 70/30) (70-30) 100 UNIT/ML injection Inject 20 Units into the skin 2 (two) times daily with a meal. (Patient taking differently: Inject 18 Units into the skin 2 (two) times daily with a meal.) 30 mL 11   irbesartan (AVAPRO) 300 MG tablet TAKE 1 TABLET BY MOUTH ONCE DAILY . APPOINTMENT REQUIRED FOR FUTURE REFILLS 90 tablet 0   levothyroxine (SYNTHROID) 88 MCG tablet Take 1 tablet by mouth once daily 90 tablet 0   rosuvastatin (CRESTOR) 10 MG tablet Take 1 tablet (10 mg total) by mouth daily. 90 tablet 3   triamterene-hydrochlorothiazide (MAXZIDE-25) 37.5-25 MG tablet Take 0.5 tablets by mouth daily. 90 tablet 3   No  current facility-administered medications for this visit.    REVIEW OF SYSTEMS:   Constitutional: ( - ) fevers, ( - )  chills , ( - ) night sweats Eyes: ( - ) blurriness of vision, ( - ) double vision, ( - ) watery eyes Ears, nose, mouth, throat, and face: ( - ) mucositis, ( - ) sore throat Respiratory: ( - ) cough, ( - ) dyspnea, ( - ) wheezes Cardiovascular: ( - ) palpitation, ( - ) chest discomfort, ( - ) lower extremity swelling Gastrointestinal:  ( - ) nausea, ( - ) heartburn, ( - )  change in bowel habits Skin: ( - ) abnormal skin rashes Lymphatics: ( - ) new lymphadenopathy, ( - ) easy bruising Neurological: ( - ) numbness, ( - ) tingling, ( - ) new weaknesses Behavioral/Psych: ( - ) mood change, ( - ) new changes  All other systems were reviewed with the patient and are negative.  PHYSICAL EXAMINATION: ECOG PERFORMANCE STATUS: 0 - Asymptomatic  Vitals:   12/20/21 1422  BP: (!) 165/70  Pulse: 62  Resp: 15  Temp: 98.1 F (36.7 C)  SpO2: 99%   Filed Weights   12/20/21 1422  Weight: 177 lb 14.4 oz (80.7 kg)    GENERAL: Well-appearing elderly Caucasian male, alert, no distress and comfortable SKIN: skin color, texture, turgor are normal, no rashes or significant lesions EYES: conjunctiva are pink and non-injected, sclera clear LUNGS: clear to auscultation and percussion with normal breathing effort HEART: regular rate & rhythm and no murmurs and no lower extremity edema. Musculoskeletal: no cyanosis of digits and no clubbing  PSYCH: alert & oriented x 3, fluent speech NEURO: no focal motor/sensory deficits  LABORATORY DATA:  I have reviewed the data as listed    Latest Ref Rng & Units 12/20/2021    1:57 PM 09/20/2021    1:53 PM 08/31/2021    1:26 PM  CBC  WBC 4.0 - 10.5 K/uL 6.8  5.8  5.9   Hemoglobin 13.0 - 17.0 g/dL 15.5  15.1  16.0   Hematocrit 39.0 - 52.0 % 44.6  44.1  46.1   Platelets 150 - 400 K/uL 212  193  243        Latest Ref Rng & Units 08/31/2021     1:44 PM 08/31/2021    1:26 PM 07/15/2021   11:08 AM  CMP  Glucose 70 - 99 mg/dL  103  345   BUN 8 - 23 mg/dL  15  19   Creatinine 0.61 - 1.24 mg/dL  1.10  1.24   Sodium 135 - 145 mmol/L  137  135   Potassium 3.5 - 5.1 mmol/L  3.6  4.1   Chloride 98 - 111 mmol/L  102  99   CO2 22 - 32 mmol/L  27  32   Calcium 8.9 - 10.3 mg/dL  9.1  9.3   Total Protein 6.5 - 8.1 g/dL 7.4   7.3   Total Bilirubin 0.3 - 1.2 mg/dL 0.8   1.2   Alkaline Phos 38 - 126 U/L 135   143   AST 15 - 41 U/L 38   25   ALT 0 - 44 U/L 46   27    RADIOGRAPHIC STUDIES: I have personally reviewed the radiological images as listed and agreed with the findings in the report. No results found.  ASSESSMENT & PLAN Logan [name] 71 y.o. male with medical history significant for hereditary hemachromatosis presents for a follow up visit.  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  The patient was previously treated with phlebotomy when he was previously diagnosed by the GI department.  Due to his recent elevations in ferritin he was referred to hematology in order to manage his disorder.  Our recommendation would be for a goal ferritin of less than 50.  We will achieve this with phlebotomies performed every 2 weeks until he reaches target.    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 Iron studies pending.  --RTC in 3 months for labs and 6 months for repeat clinic visit  No orders of the defined types were placed in this encounter.   All questions were answered. The patient knows to call the  clinic with any problems, questions or concerns.  A total of more than 30 minutes were spent on this encounter with face-to-face time and non-face-to-face time, including preparing to see the patient, ordering tests and/or medications, counseling the patient and coordination of care as outlined above.   Ledell Peoples, MD Department of Hematology/Oncology [place] [facility] Phone: [phone] Pager: [phone] Email: [email or web address]  12/20/2021 3:06 PM

## 2021-12-21 ENCOUNTER — Telehealth: Payer: Self-pay

## 2021-12-21 DIAGNOSIS — H6983 Other specified disorders of Eustachian tube, bilateral: Secondary | ICD-10-CM | POA: Diagnosis not present

## 2021-12-21 DIAGNOSIS — H9193 Unspecified hearing loss, bilateral: Secondary | ICD-10-CM | POA: Insufficient documentation

## 2021-12-21 DIAGNOSIS — H6993 Unspecified Eustachian tube disorder, bilateral: Secondary | ICD-10-CM | POA: Insufficient documentation

## 2021-12-21 NOTE — Telephone Encounter (Signed)
Contacted pt per Dr. Lorenso Courier to :  let pt know that his ferritin was 54. There is no need for phlebotomy at this time. We will see him back for labs in 3 months and clinic visit in Jan 2024.  Pt acknowledged and verbalized understanding.

## 2021-12-22 DIAGNOSIS — H906 Mixed conductive and sensorineural hearing loss, bilateral: Secondary | ICD-10-CM | POA: Diagnosis not present

## 2021-12-26 ENCOUNTER — Other Ambulatory Visit: Payer: Self-pay | Admitting: Internal Medicine

## 2021-12-26 DIAGNOSIS — E039 Hypothyroidism, unspecified: Secondary | ICD-10-CM

## 2021-12-28 ENCOUNTER — Other Ambulatory Visit: Payer: Self-pay | Admitting: *Deleted

## 2021-12-28 NOTE — Patient Outreach (Signed)
[facility] [place] [facility]) Care Management  12/28/2021  Logan [name] May 02, 1951 [id number]  RN Health Coach attempted follow up outreach call to patient.  Patient was unavailable. HIPPA compliance voicemail message left with return callback number. [phone] Patient A1C is 8.2 increased from 6.9.  patient needs to schedule cataract surgery. RN  Health Coach Case Closure. Refer to Care Coordinator for continue follow up  care.   Plan: RN Health Coach Case Closure Refer to [place] Management [phone]

## 2022-01-22 ENCOUNTER — Other Ambulatory Visit: Payer: Self-pay | Admitting: Internal Medicine

## 2022-01-22 DIAGNOSIS — E1051 Type 1 diabetes mellitus with diabetic peripheral angiopathy without gangrene: Secondary | ICD-10-CM

## 2022-02-22 ENCOUNTER — Encounter: Payer: Self-pay | Admitting: Hematology and Oncology

## 2022-02-22 DIAGNOSIS — E109 Type 1 diabetes mellitus without complications: Secondary | ICD-10-CM | POA: Diagnosis not present

## 2022-02-22 DIAGNOSIS — H25813 Combined forms of age-related cataract, bilateral: Secondary | ICD-10-CM | POA: Diagnosis not present

## 2022-02-22 DIAGNOSIS — H4423 Degenerative myopia, bilateral: Secondary | ICD-10-CM | POA: Diagnosis not present

## 2022-02-25 NOTE — Progress Notes (Signed)
[facility] Note  03/04/2022     CHIEF COMPLAINT Patient presents for Retina Evaluation   HISTORY OF PRESENT ILLNESS: Logan [name] is a 71 y.o. male who presents to the clinic today for:   HPI     Retina Evaluation   In both eyes.  I, the attending physician,  performed the HPI with the patient and updated documentation appropriately.        Comments   Patient here for Retina Evaluation. Referred by Dr Wyatt Portela. Patient states vision doing fine. No eye pain.       Last edited by Bernarda Caffey, MD on 03/04/2022 11:35 AM.    Pt is here on the referral of Dr. Wyatt Portela for concern of diabetic retinopathy, pt states this was his first appt with him, pt used to see Dr. Ishmael Holter in the 80's and had focal laser OU in 1984  Referring physician: Debbra Riding, MD [address] [place],  [place] [address]  HISTORICAL INFORMATION:   Selected notes from the MEDICAL RECORD NUMBER Referred by Dr. Wyatt Portela for concern of myopic degeneration LEE:  Ocular Hx- PMH-    CURRENT MEDICATIONS: No current outpatient medications on file. (Ophthalmic Drugs)   No current facility-administered medications for this visit. (Ophthalmic Drugs)   Current Outpatient Medications (Other)  Medication Sig   amLODipine (NORVASC) 10 MG tablet Take 1 tablet by mouth once daily   aspirin 81 MG tablet Take 81 mg by mouth 2 (two) times daily.   Continuous Blood Gluc Receiver (FREESTYLE LIBRE 14 DAY READER) DEVI    Continuous Blood Gluc Sensor (FREESTYLE LIBRE 14 DAY SENSOR) MISC    insulin glargine (LANTUS) 100 UNIT/ML injection INJECT 20 UNITS SUBCUTANEOUSLY ONCE DAILY AT BEDTIME   insulin NPH-regular Human (NOVOLIN 70/30) (70-30) 100 UNIT/ML injection Inject 20 Units into the skin 2 (two) times daily with a meal. (Patient taking differently: Inject 18 Units into the skin 2 (two) times daily with a meal.)   irbesartan (AVAPRO) 300 MG tablet TAKE 1 TABLET BY  MOUTH ONCE DAILY . APPOINTMENT REQUIRED FOR FUTURE REFILLS   levothyroxine (SYNTHROID) 88 MCG tablet Take 1 tablet by mouth once daily   rosuvastatin (CRESTOR) 10 MG tablet Take 1 tablet (10 mg total) by mouth daily.   triamterene-hydrochlorothiazide (MAXZIDE-25) 37.5-25 MG tablet Take 0.5 tablets by mouth daily.   No current facility-administered medications for this visit. (Other)   REVIEW OF SYSTEMS: ROS   Positive for: Endocrine, Cardiovascular, Eyes Last edited by Theodore Demark, COA on 03/04/2022  9:35 AM.     ALLERGIES No Known Allergies  PAST MEDICAL HISTORY Past Medical History:  Diagnosis Date   Aortic atherosclerosis ([place])    Cataract    Coronary artery disease involving native coronary artery of native heart without angina pectoris 11/01/2007   Last Cath '04: LAD w/ 60% long lesion; 1st Dx 70% ostial; 2nd Dx  70% ostial; Cx 30% ostial; RCA-dominant multiple 30-40% lesions; PDA 30% lesions  Last nuclear stress March '09 - negative for ischemia    Diabetes mellitus    Diabetic retinopathy    Hearing loss    Hemochromatosis    Hyperlipidemia    Hypertension    Tubular adenoma of colon    Past Surgical History:  Procedure Laterality Date   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   Procedure: Right Great Toe Amputation;  Surgeon: Newt Minion, MD;  Location: [facility];  Service: Orthopedics;  Laterality: Right;  Right Great Toe Amputation   INNER EAR SURGERY     right   FAMILY HISTORY Family History  Problem Relation Age of Onset   Asthma Mother    Heart failure Mother    Emphysema Mother    Diabetes Father    Alcohol abuse Father    Cancer Father        prostate   Stroke Father    Diabetes Sister    Diabetes Brother    Colon cancer Maternal Uncle        dx'd 25   Blindness Paternal Grandmother    Diabetes Paternal Grandmother    Heart disease Neg Hx    Rectal cancer Neg Hx    Stomach cancer Neg Hx    SOCIAL HISTORY Social History   Tobacco Use   Smoking  status: Never   Smokeless tobacco: Never  Vaping Use   Vaping Use: Never used  Substance Use Topics   Alcohol use: Yes    Comment: occ beer but very rare   Drug use: No       OPHTHALMIC EXAM:  Base Eye Exam     Visual Acuity (Snellen - Linear)       Right Left   Dist cc 20/40 -2 20/30 +1   Dist ph cc 20/30 -2 20/30 +2    Correction: Glasses         Tonometry (Tonopen, 9:32 AM)       Right Left   Pressure 22 21         Pupils       Dark Light Shape React APD   Right 3 2 Round Brisk None   Left 3 2 Round Brisk None         Visual Fields (Counting fingers)       Left Right    Full Full         Extraocular Movement       Right Left    Full, Ortho Full, Ortho         Neuro/Psych     Oriented x3: Yes   Mood/Affect: Normal         Dilation     Both eyes: 1.0% Mydriacyl, 2.5% Phenylephrine @ 9:32 AM           Slit Lamp and Fundus Exam     Slit Lamp Exam       Right Left   Lids/Lashes Dermatochalasis - upper lid, Meibomian gland dysfunction Dermatochalasis - upper lid, Meibomian gland dysfunction   Conjunctiva/Sclera Inferior conjunctivochalasis Inferior conjunctivochalasis   Cornea trace PEE, tear film debris, 1+ Guttata trace PEE, tear film debris, 1-2+ Guttata centrally   Anterior Chamber deep, clear, narrow temporal angle deep, clear, narrow temporal angle   Iris Round and dilated, No NVI Round and dilated, No NVI   Lens 2-3+ Nuclear sclerosis with brunescence, 2+ Cortical cataract, 1+ Posterior subcapsular cataract 3+ Nuclear sclerosis with brunescence, 2+ Cortical cataract, 1+ central posterior subcapsular cataract   Anterior Vitreous mild syneresis mild syneresis         Fundus Exam       Right Left   Disc mild Pallor, Sharp rim, temporal PPA mild Pallor, Sharp rim, tilted, temporal PPA   C/D Ratio 0.4 0.3   Macula Flat, Blunted foveal reflex, pigmented focal laser scars, rare MA, no edema Flat, Blunted foveal reflex, RPE  mottling, heavy pigmented focal laser scars temporal macula, rno edema   Vessels attenuated, mild tortuosity,  no NVE attenuated, no NVE   Periphery Attached, scattered punctate MA, paving stone degeneration inferiorly Attached, scattered punctate MA, paving stone degeneration inferiorly           Refraction     Wearing Rx       Sphere Cylinder Axis Add   Right -6.00 +1.25 153 +2.00   Left -6.25 +0.75 004 +2.00         Manifest Refraction       Sphere Cylinder Axis Dist VA   Right -5.50 +1.25 170 20/25-1   Left -6.25 +0.75 170 20/40+2           IMAGING AND PROCEDURES  Imaging and Procedures for 03/04/2022  OCT, Retina - OU - Both Eyes       Right Eye Quality was good. Central Foveal Thickness: 256. Progression has no prior data. Findings include no IRF, no SRF, abnormal foveal contour, myopic contour, subretinal hyper-reflective material, pigment epithelial detachment, outer retinal atrophy, vitreomacular adhesion (Scattered focal laser changes (ORA, SRHM), no DME).   Left Eye Quality was good. Central Foveal Thickness: 266. Progression has no prior data. Findings include no IRF, no SRF, abnormal foveal contour, myopic contour, subretinal hyper-reflective material, outer retinal atrophy, vitreomacular adhesion (Scattered focal laser changes (ORA, SRHM), no DME).   Notes *Images captured and stored on drive  Diagnosis / Impression:  No IRF/SRF OU -- no DME OU Scattered focal laser changes (ORA, SRHM) OU  Clinical management:  See below  Abbreviations: NFP - Normal foveal profile. CME - cystoid macular edema. PED - pigment epithelial detachment. IRF - intraretinal fluid. SRF - subretinal fluid. EZ - ellipsoid zone. ERM - epiretinal membrane. ORA - outer retinal atrophy. ORT - outer retinal tubulation. SRHM - subretinal hyper-reflective material. IRHM - intraretinal hyper-reflective material      Fluorescein Angiography Optos (Transit OD)       Right  Eye Progression has no prior data. Early phase findings include staining, microaneurysm. Mid/Late phase findings include staining, microaneurysm (Low fluorescein signal, No NVE, scattered MA with mild leakage).   Left Eye Progression has no prior data. Early phase findings include staining, window defect, microaneurysm. Mid/Late phase findings include staining, window defect, microaneurysm (Low fluorescein signal, No NVE, scattered MA with mild leakage, staining and window defect of focal laser changes temporal macula).   Notes **Images stored on drive**  Impression: OD: Low fluorescein signal, No NVE, scattered MA with mild leakage OS: Low fluorescein signal, No NVE, scattered MA with mild leakage, staining and window defect of focal laser changes temporal macula            ASSESSMENT/PLAN:    ICD-10-CM   1. 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 Essential hypertension  I10     3. Hypertensive retinopathy of both eyes  H35.033 Fluorescein Angiography Optos (Transit OD)    4. Uncomplicated degenerative myopia of both eyes  H44.23     5. Combined forms of age-related cataract of both eyes  H25.813      Moderate nonproliferative diabetic retinopathy w/o DME, both eyes  - hx of focal laser with Dr. Ishmael Holter OU (OS>OD) (1984) - The incidence, risk factors for progression, natural history and treatment options for diabetic retinopathy were discussed with patient.   - The need for close monitoring of blood glucose, blood pressure, and serum lipids, avoiding cigarette or any  type of tobacco, and the need for long term follow up was also discussed with patient. - exam shows scattered punctate MA, heavy focal laser OU, no NV  - FA (09.29.23) shows OD: Low fluorescein signal, No NVE, scattered MA with mild leakage; OS: Low fluorescein signal, No NVE,  scattered MA with mild leakage, staining and window defect of focal laser changes temporal macula - OCT without diabetic macular edema, both eyes  - no retinal or ophthalmic interventions indicated or recommended - f/u in 6 mos -- DFE/OCT  2,3. Hypertensive retinopathy OU - discussed importance of tight BP control - monitor  4. Degenerative myopia OU  - no complications - monitor  5. Mixed Cataract OU - The symptoms of cataract, surgical options, and treatments and risks were discussed with patient. - discussed diagnosis and progression - under the expert management of Dr. Wyatt Portela  Ophthalmic Meds Ordered this visit:  No orders of the defined types were placed in this encounter.    Return in about 6 months (around 09/02/2022) for NPDR and myopic degeneration OU, Dilated Exam, OCT.  There are no Patient Instructions on file for this visit.   Explained the diagnoses, plan, and follow up with the patient and they expressed understanding.  Patient expressed understanding of the importance of proper follow up care.   This document serves as a record of services personally performed by Gardiner Sleeper, MD, PhD. It was created on their behalf by San Jetty. Owens Shark, OA an ophthalmic technician. The creation of this record is the provider's dictation and/or activities during the visit.    Electronically signed by: San Jetty. Owens Shark, OA '@TODAY'$ @ 11:48 AM  Gardiner Sleeper, M.D., Ph.D. Diseases & Surgery of the Retina and Vitreous [facility]  I have reviewed the above documentation for accuracy and completeness, and I agree with the above. Gardiner Sleeper, M.D., Ph.D. 03/04/22 11:48 AM  Abbreviations: M myopia (nearsighted); A astigmatism; H hyperopia (farsighted); P presbyopia; Mrx spectacle prescription;  CTL contact lenses; OD right eye; OS left eye; OU both eyes  XT exotropia; ET esotropia; PEK punctate epithelial keratitis; PEE punctate epithelial erosions; DES  dry eye syndrome; MGD meibomian gland dysfunction; ATs artificial tears; PFAT's preservative free artificial tears; [place] nuclear sclerotic cataract; PSC posterior subcapsular cataract; ERM epi-retinal membrane; PVD posterior vitreous detachment; RD retinal detachment; DM diabetes mellitus; DR diabetic retinopathy; NPDR non-proliferative diabetic retinopathy; PDR proliferative diabetic retinopathy; CSME clinically significant macular edema; DME diabetic macular edema; dbh dot blot hemorrhages; CWS cotton wool spot; POAG primary open angle glaucoma; C/D cup-to-disc ratio; HVF humphrey visual field; GVF goldmann visual field; OCT optical coherence tomography; IOP intraocular pressure; BRVO Branch retinal vein occlusion; CRVO central retinal vein occlusion; CRAO central retinal artery occlusion; BRAO branch retinal artery occlusion; RT retinal tear; SB scleral buckle; PPV pars plana vitrectomy; VH Vitreous hemorrhage; PRP panretinal laser photocoagulation; IVK intravitreal kenalog; VMT vitreomacular traction; MH Macular hole;  NVD neovascularization of the disc; NVE neovascularization elsewhere; AREDS age related eye disease study; ARMD age related macular degeneration; POAG primary open angle glaucoma; EBMD epithelial/anterior basement membrane dystrophy; ACIOL anterior chamber intraocular lens; IOL intraocular lens; PCIOL posterior chamber intraocular lens; Phaco/IOL phacoemulsification with intraocular lens placement; [place] photorefractive keratectomy; LASIK laser assisted in situ keratomileusis; HTN hypertension; DM diabetes mellitus; COPD chronic obstructive pulmonary disease

## 2022-02-28 ENCOUNTER — Ambulatory Visit: Payer: Medicare Other | Admitting: *Deleted

## 2022-03-04 ENCOUNTER — Ambulatory Visit (INDEPENDENT_AMBULATORY_CARE_PROVIDER_SITE_OTHER): Payer: Medicare Other | Admitting: Ophthalmology

## 2022-03-04 ENCOUNTER — Encounter (INDEPENDENT_AMBULATORY_CARE_PROVIDER_SITE_OTHER): Payer: Self-pay | Admitting: Ophthalmology

## 2022-03-04 DIAGNOSIS — E113393 Type 2 diabetes mellitus with moderate nonproliferative diabetic retinopathy without macular edema, bilateral: Secondary | ICD-10-CM | POA: Diagnosis not present

## 2022-03-04 DIAGNOSIS — I10 Essential (primary) hypertension: Secondary | ICD-10-CM | POA: Diagnosis not present

## 2022-03-04 DIAGNOSIS — H25813 Combined forms of age-related cataract, bilateral: Secondary | ICD-10-CM | POA: Diagnosis not present

## 2022-03-04 DIAGNOSIS — H3581 Retinal edema: Secondary | ICD-10-CM

## 2022-03-04 DIAGNOSIS — H35033 Hypertensive retinopathy, bilateral: Secondary | ICD-10-CM

## 2022-03-04 DIAGNOSIS — H4423 Degenerative myopia, bilateral: Secondary | ICD-10-CM

## 2022-03-21 ENCOUNTER — Other Ambulatory Visit: Payer: Self-pay

## 2022-03-21 ENCOUNTER — Other Ambulatory Visit: Payer: Self-pay | Admitting: Hematology and Oncology

## 2022-03-21 ENCOUNTER — Inpatient Hospital Stay: Payer: Medicare Other | Attending: Hematology and Oncology

## 2022-03-21 LAB — CMP (CANCER CENTER ONLY)
ALT: 30 U/L (ref 0–44)
AST: 23 U/L (ref 15–41)
Albumin: 4.1 g/dL (ref 3.5–5.0)
Alkaline Phosphatase: 152 U/L — ABNORMAL HIGH (ref 38–126)
Anion gap: 4 — ABNORMAL LOW (ref 5–15)
BUN: 16 mg/dL (ref 8–23)
CO2: 33 mmol/L — ABNORMAL HIGH (ref 22–32)
Calcium: 9.1 mg/dL (ref 8.9–10.3)
Chloride: 99 mmol/L (ref 98–111)
Creatinine: 1.27 mg/dL — ABNORMAL HIGH (ref 0.61–1.24)
GFR, Estimated: >60 mL/min (ref >60)
Glucose, Bld: 442 mg/dL — ABNORMAL HIGH (ref 70–99)
Potassium: 4.2 mmol/L (ref 3.5–5.1)
Sodium: 136 mmol/L (ref 135–145)
Total Bilirubin: 0.8 mg/dL (ref 0.3–1.2)
Total Protein: 7 g/dL (ref 6.5–8.1)

## 2022-03-21 LAB — CBC WITH DIFFERENTIAL (CANCER CENTER ONLY)
Abs Immature Granulocytes: 0.01 10*3/uL (ref 0.00–0.07)
Basophils Absolute: 0.1 10*3/uL (ref 0.0–0.1)
Basophils Relative: 2 %
Eosinophils Absolute: 0.6 10*3/uL — ABNORMAL HIGH (ref 0.0–0.5)
Eosinophils Relative: 9 %
HCT: 43 % (ref 39.0–52.0)
Hemoglobin: 15 g/dL (ref 13.0–17.0)
Immature Granulocytes: 0 %
Lymphocytes Relative: 30 %
Lymphs Abs: 1.8 10*3/uL (ref 0.7–4.0)
MCH: 32 pg (ref 26.0–34.0)
MCHC: 34.9 g/dL (ref 30.0–36.0)
MCV: 91.7 fL (ref 80.0–100.0)
Monocytes Absolute: 0.6 10*3/uL (ref 0.1–1.0)
Monocytes Relative: 10 %
Neutro Abs: 3 10*3/uL (ref 1.7–7.7)
Neutrophils Relative %: 49 %
Platelet Count: 194 10*3/uL (ref 150–400)
RBC: 4.69 MIL/uL (ref 4.22–5.81)
RDW: 13.4 % (ref 11.5–15.5)
WBC Count: 6.1 10*3/uL (ref 4.0–10.5)
nRBC: 0 % (ref 0.0–0.2)

## 2022-03-21 LAB — IRON AND IRON BINDING CAPACITY (CC-WL,HP ONLY)
Iron: 219 ug/dL — ABNORMAL HIGH (ref 45–182)
Saturation Ratios: 91 % — ABNORMAL HIGH (ref 17.9–39.5)
TIBC: 241 ug/dL — ABNORMAL LOW (ref 250–450)
UIBC: 22 ug/dL — ABNORMAL LOW (ref 117–376)

## 2022-03-21 LAB — FERRITIN: Ferritin: 48 ng/mL (ref 24–336)

## 2022-03-22 ENCOUNTER — Telehealth: Payer: Self-pay | Admitting: *Deleted

## 2022-03-22 NOTE — Telephone Encounter (Signed)
-----   Message from Orson Slick, MD sent at 03/22/2022  9:00 AM EDT ----- Logan [name],  Please let Mr. Stickels know that his ferritin is currently at 83.  Phlebotomies will be required once his ferritin is greater than 150.  No phlebotomy is needed at this time.  We will plan to see him back in January 2024 as scheduled.   ----- Message ----- From: Buel Ream, [facility] [place] Sent: 03/21/2022   2:42 PM EDT To: Orson Slick, MD

## 2022-03-22 NOTE — Telephone Encounter (Signed)
Notified of message below. Confirmed appt in January

## 2022-03-27 ENCOUNTER — Other Ambulatory Visit: Payer: Self-pay | Admitting: Internal Medicine

## 2022-03-27 DIAGNOSIS — E039 Hypothyroidism, unspecified: Secondary | ICD-10-CM

## 2022-04-05 DIAGNOSIS — Z23 Encounter for immunization: Secondary | ICD-10-CM | POA: Diagnosis not present

## 2022-04-17 ENCOUNTER — Other Ambulatory Visit: Payer: Self-pay | Admitting: Internal Medicine

## 2022-04-17 DIAGNOSIS — I10 Essential (primary) hypertension: Secondary | ICD-10-CM

## 2022-05-12 DIAGNOSIS — E7800 Pure hypercholesterolemia, unspecified: Secondary | ICD-10-CM | POA: Diagnosis not present

## 2022-05-12 DIAGNOSIS — E039 Hypothyroidism, unspecified: Secondary | ICD-10-CM | POA: Diagnosis not present

## 2022-05-12 DIAGNOSIS — E109 Type 1 diabetes mellitus without complications: Secondary | ICD-10-CM | POA: Diagnosis not present

## 2022-05-19 DIAGNOSIS — I2510 Atherosclerotic heart disease of native coronary artery without angina pectoris: Secondary | ICD-10-CM | POA: Diagnosis not present

## 2022-05-19 DIAGNOSIS — I10 Essential (primary) hypertension: Secondary | ICD-10-CM | POA: Diagnosis not present

## 2022-05-19 DIAGNOSIS — E1140 Type 2 diabetes mellitus with diabetic neuropathy, unspecified: Secondary | ICD-10-CM | POA: Diagnosis not present

## 2022-05-19 DIAGNOSIS — E039 Hypothyroidism, unspecified: Secondary | ICD-10-CM | POA: Diagnosis not present

## 2022-05-19 DIAGNOSIS — E7800 Pure hypercholesterolemia, unspecified: Secondary | ICD-10-CM | POA: Diagnosis not present

## 2022-05-19 DIAGNOSIS — E11319 Type 2 diabetes mellitus with unspecified diabetic retinopathy without macular edema: Secondary | ICD-10-CM | POA: Diagnosis not present

## 2022-05-19 DIAGNOSIS — E109 Type 1 diabetes mellitus without complications: Secondary | ICD-10-CM | POA: Diagnosis not present

## 2022-06-20 ENCOUNTER — Other Ambulatory Visit: Payer: Medicare Other

## 2022-06-20 ENCOUNTER — Ambulatory Visit: Payer: Medicare Other | Admitting: Hematology and Oncology

## 2022-06-21 ENCOUNTER — Other Ambulatory Visit: Payer: Self-pay | Admitting: Hematology and Oncology

## 2022-06-21 ENCOUNTER — Inpatient Hospital Stay (HOSPITAL_BASED_OUTPATIENT_CLINIC_OR_DEPARTMENT_OTHER): Payer: Medicare Other | Admitting: Hematology and Oncology

## 2022-06-21 ENCOUNTER — Other Ambulatory Visit: Payer: Self-pay

## 2022-06-21 ENCOUNTER — Inpatient Hospital Stay: Payer: Medicare Other | Attending: Hematology and Oncology

## 2022-06-21 DIAGNOSIS — I700 Atherosclerosis of aorta: Secondary | ICD-10-CM | POA: Diagnosis not present

## 2022-06-21 DIAGNOSIS — Z7982 Long term (current) use of aspirin: Secondary | ICD-10-CM | POA: Insufficient documentation

## 2022-06-21 DIAGNOSIS — Z794 Long term (current) use of insulin: Secondary | ICD-10-CM | POA: Diagnosis not present

## 2022-06-21 DIAGNOSIS — I2510 Atherosclerotic heart disease of native coronary artery without angina pectoris: Secondary | ICD-10-CM | POA: Insufficient documentation

## 2022-06-21 DIAGNOSIS — Z86010 Personal history of colonic polyps: Secondary | ICD-10-CM | POA: Diagnosis not present

## 2022-06-21 DIAGNOSIS — Z79890 Hormone replacement therapy (postmenopausal): Secondary | ICD-10-CM | POA: Diagnosis not present

## 2022-06-21 DIAGNOSIS — Z800 Family history of malignant neoplasm of digestive organs: Secondary | ICD-10-CM | POA: Insufficient documentation

## 2022-06-21 DIAGNOSIS — I10 Essential (primary) hypertension: Secondary | ICD-10-CM | POA: Insufficient documentation

## 2022-06-21 DIAGNOSIS — E11319 Type 2 diabetes mellitus with unspecified diabetic retinopathy without macular edema: Secondary | ICD-10-CM | POA: Insufficient documentation

## 2022-06-21 DIAGNOSIS — E785 Hyperlipidemia, unspecified: Secondary | ICD-10-CM | POA: Diagnosis not present

## 2022-06-21 DIAGNOSIS — Z79899 Other long term (current) drug therapy: Secondary | ICD-10-CM | POA: Insufficient documentation

## 2022-06-21 LAB — CMP (CANCER CENTER ONLY)
ALT: 27 U/L (ref 0–44)
AST: 23 U/L (ref 15–41)
Albumin: 4.2 g/dL (ref 3.5–5.0)
Alkaline Phosphatase: 152 U/L — ABNORMAL HIGH (ref 38–126)
Anion gap: 5 (ref 5–15)
BUN: 14 mg/dL (ref 8–23)
CO2: 33 mmol/L — ABNORMAL HIGH (ref 22–32)
Calcium: 9.6 mg/dL (ref 8.9–10.3)
Chloride: 103 mmol/L (ref 98–111)
Creatinine: 1.25 mg/dL — ABNORMAL HIGH (ref 0.61–1.24)
GFR, Estimated: >60 mL/min (ref >60)
Glucose, Bld: 143 mg/dL — ABNORMAL HIGH (ref 70–99)
Potassium: 4 mmol/L (ref 3.5–5.1)
Sodium: 141 mmol/L (ref 135–145)
Total Bilirubin: 1 mg/dL (ref 0.3–1.2)
Total Protein: 7.2 g/dL (ref 6.5–8.1)

## 2022-06-21 LAB — CBC WITH DIFFERENTIAL (CANCER CENTER ONLY)
Abs Immature Granulocytes: 0.02 10*3/uL (ref 0.00–0.07)
Basophils Absolute: 0.1 10*3/uL (ref 0.0–0.1)
Basophils Relative: 2 %
Eosinophils Absolute: 0.5 10*3/uL (ref 0.0–0.5)
Eosinophils Relative: 9 %
HCT: 46.9 % (ref 39.0–52.0)
Hemoglobin: 16.5 g/dL (ref 13.0–17.0)
Immature Granulocytes: 0 %
Lymphocytes Relative: 36 %
Lymphs Abs: 2.2 10*3/uL (ref 0.7–4.0)
MCH: 32.4 pg (ref 26.0–34.0)
MCHC: 35.2 g/dL (ref 30.0–36.0)
MCV: 92.1 fL (ref 80.0–100.0)
Monocytes Absolute: 0.6 10*3/uL (ref 0.1–1.0)
Monocytes Relative: 11 %
Neutro Abs: 2.5 10*3/uL (ref 1.7–7.7)
Neutrophils Relative %: 42 %
Platelet Count: 203 10*3/uL (ref 150–400)
RBC: 5.09 MIL/uL (ref 4.22–5.81)
RDW: 13.3 % (ref 11.5–15.5)
WBC Count: 5.9 10*3/uL (ref 4.0–10.5)
nRBC: 0 % (ref 0.0–0.2)

## 2022-06-21 LAB — RETIC PANEL
Immature Retic Fract: 5.9 % (ref 2.3–15.9)
RBC.: 5.1 MIL/uL (ref 4.22–5.81)
Retic Count, Absolute: 40.8 10*3/uL (ref 19.0–186.0)
Retic Ct Pct: 0.8 % (ref 0.4–3.1)
Reticulocyte Hemoglobin: 35.2 pg (ref >27.9)

## 2022-06-21 LAB — IRON AND IRON BINDING CAPACITY (CC-WL,HP ONLY)
Iron: 252 ug/dL — ABNORMAL HIGH (ref 45–182)
Saturation Ratios: 98 % — ABNORMAL HIGH (ref 17.9–39.5)
TIBC: 256 ug/dL (ref 250–450)
UIBC: 4 ug/dL — ABNORMAL LOW (ref 117–376)

## 2022-06-21 LAB — FERRITIN: Ferritin: 56 ng/mL (ref 24–336)

## 2022-06-21 NOTE — Progress Notes (Signed)
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 Genetic testing shows patient is homozygous for C282Y 01/13/2021: establish care with Dr. Lorenso Courier  01/19/2021: Ferritin 129, Hgb 14.6 02/02/2021: Ferritin 78, Hgb 13.5 02/16/2021: Ferritin 53, Hgb 13.6 03/03/2022: last phlebotomy. Subsequent HELD as patient was at target.  06/22/2021: ferritin 36, Hgb 16.3 09/20/2021: ferritin 57, Hgb 15.5  Interval History:  Logan [name] 72 y.o. male with medical history significant for hereditary hemachromatosis presents for a follow up visit. The patient's last visit was on 12/20/2021. In the interim since the last visit he has not required any further phlebotomy.  On exam today Mr. Biehn reports he has been well in the interim since her last visit.  He reports he has a few "aches and pains".  He notes that he enjoys climbing trees and sitting in tree stands.  He notes that he is not having any trouble with lightheadedness, dizziness or shortness of breath.  He notes his energy levels are good.  He notes he "eats too much" and does not have good control of his blood sugars.  He reports that he is exercising well and currently plans to walk 5 miles today.  Overall he is at his baseline level of health with no questions or concerns today.  He currently denies any fevers, chills, sweats, nausea, vomiting or diarrhea.    A full 10 point ROS is listed below.  MEDICAL HISTORY:  Past Medical History:  Diagnosis Date   Aortic atherosclerosis ([place])    Cataract    Coronary artery disease  involving native coronary artery of native heart without angina pectoris 11/01/2007   Last Cath '04: LAD w/ 60% long lesion; 1st Dx 70% ostial; 2nd Dx  70% ostial; Cx 30% ostial; RCA-dominant multiple 30-40% lesions; PDA 30% lesions  Last nuclear stress March '09 - negative for ischemia    Diabetes mellitus    Diabetic retinopathy    Hearing loss    Hemochromatosis    Hyperlipidemia    Hypertension    Tubular adenoma of colon     SURGICAL HISTORY: Past Surgical History:  Procedure Laterality Date   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   Procedure: Right Great Toe Amputation;  Surgeon: Newt Minion, MD;  Location: [facility];  Service: Orthopedics;  Laterality: Right;  Right Great Toe Amputation   INNER EAR SURGERY     right    SOCIAL HISTORY: Social History   Socioeconomic History   Marital status: Widowed    Spouse name: Not on file   Number of children: 1   Years of education: 47   Highest education level: Not on file  Occupational History   Occupation: retired    Fish farm manager: [employer]  Tobacco Use   Smoking status: Never   Smokeless tobacco: Never  Vaping Use   Vaping Use: Never used  Substance and Sexual Activity   Alcohol use: Yes    Comment: occ beer but very rare   Drug use: No   Sexual activity: Yes    Partners: Female  Other Topics Concern   Not on file  Social History Narrative   HSG. Married - '  45 - '04. 1 son '80. Work - semi-retired. Has a girlfriend.   Social Determinants of Health   Financial Resource Strain: Low Risk  (03/19/2020)   Overall Financial Resource Strain (CARDIA)    Difficulty of Paying Living Expenses: Not hard at all  Food Insecurity: No Food Insecurity (11/25/2021)   Hunger Vital Sign    Worried About Running Out of Food in the Last Year: Never true    Ran Out of Food in the Last Year: Never true  Transportation Needs: No Transportation Needs (11/25/2021)   PRAPARE - Hydrologist (Medical): No    Lack of  Transportation (Non-Medical): No  Physical Activity: Sufficiently Active (03/19/2020)   Exercise Vital Sign    Days of Exercise per Week: 3 days    Minutes of Exercise per Session: 60 min  Stress: Not on file  Social Connections: Not on file  Intimate Partner Violence: Not At Risk (03/19/2020)   Humiliation, Afraid, Rape, and Kick questionnaire    Fear of Current or Ex-Partner: No    Emotionally Abused: No    Physically Abused: No    Sexually Abused: No    FAMILY HISTORY: Family History  Problem Relation Age of Onset   Asthma Mother    Heart failure Mother    Emphysema Mother    Diabetes Father    Alcohol abuse Father    Cancer Father        prostate   Stroke Father    Diabetes Sister    Diabetes Brother    Colon cancer Maternal Uncle        dx'd 60   Blindness Paternal Grandmother    Diabetes Paternal Grandmother    Heart disease Neg Hx    Rectal cancer Neg Hx    Stomach cancer Neg Hx     ALLERGIES:  has No Known Allergies.  MEDICATIONS:  Current Outpatient Medications  Medication Sig Dispense Refill   amLODipine (NORVASC) 10 MG tablet Take 1 tablet by mouth once daily 90 tablet 0   aspirin 81 MG tablet Take 81 mg by mouth 2 (two) times daily.     Continuous Blood Gluc Receiver (FREESTYLE LIBRE 14 DAY READER) DEVI      Continuous Blood Gluc Sensor (FREESTYLE LIBRE 14 DAY SENSOR) MISC      insulin glargine (LANTUS) 100 UNIT/ML injection INJECT 20 UNITS SUBCUTANEOUSLY ONCE DAILY AT BEDTIME 30 mL 0   insulin NPH-regular Human (NOVOLIN 70/30) (70-30) 100 UNIT/ML injection Inject 20 Units into the skin 2 (two) times daily with a meal. (Patient taking differently: Inject 18 Units into the skin 2 (two) times daily with a meal.) 30 mL 11   irbesartan (AVAPRO) 300 MG tablet TAKE 1 TABLET BY MOUTH ONCE DAILY . APPOINTMENT REQUIRED FOR FUTURE REFILLS 90 tablet 0   levothyroxine (SYNTHROID) 88 MCG tablet Take 1 tablet by mouth once daily 90 tablet 0   rosuvastatin (CRESTOR) 10  MG tablet Take 1 tablet (10 mg total) by mouth daily. 90 tablet 3   triamterene-hydrochlorothiazide (MAXZIDE-25) 37.5-25 MG tablet Take 0.5 tablets by mouth daily. 90 tablet 3   No current facility-administered medications for this visit.    REVIEW OF SYSTEMS:   Constitutional: ( - ) fevers, ( - )  chills , ( - ) night sweats Eyes: ( - ) blurriness of vision, ( - ) double vision, ( - ) watery eyes Ears, nose, mouth, throat, and face: ( - ) mucositis, ( - )  sore throat Respiratory: ( - ) cough, ( - ) dyspnea, ( - ) wheezes Cardiovascular: ( - ) palpitation, ( - ) chest discomfort, ( - ) lower extremity swelling Gastrointestinal:  ( - ) nausea, ( - ) heartburn, ( - ) change in bowel habits Skin: ( - ) abnormal skin rashes Lymphatics: ( - ) new lymphadenopathy, ( - ) easy bruising Neurological: ( - ) numbness, ( - ) tingling, ( - ) new weaknesses Behavioral/Psych: ( - ) mood change, ( - ) new changes  All other systems were reviewed with the patient and are negative.  PHYSICAL EXAMINATION: ECOG PERFORMANCE STATUS: 0 - Asymptomatic  Vitals:   06/21/22 1103  BP: (!) 166/69  Pulse: 80  Resp: 14  Temp: (!) 97.5 F (36.4 C)  SpO2: 99%   Filed Weights   06/21/22 1103  Weight: 174 lb 11.2 oz (79.2 kg)    GENERAL: Well-appearing elderly Caucasian male, alert, no distress and comfortable SKIN: skin color, texture, turgor are normal, no rashes or significant lesions EYES: conjunctiva are pink and non-injected, sclera clear LUNGS: clear to auscultation and percussion with normal breathing effort HEART: regular rate & rhythm and no murmurs and no lower extremity edema. Musculoskeletal: no cyanosis of digits and no clubbing  PSYCH: alert & oriented x 3, fluent speech NEURO: no focal motor/sensory deficits  LABORATORY DATA:  I have reviewed the data as listed    Latest Ref Rng & Units 06/21/2022   10:24 AM 03/21/2022    2:30 PM 12/20/2021    1:57 PM  CBC  WBC 4.0 - 10.5 K/uL 5.9   6.1  6.8   Hemoglobin 13.0 - 17.0 g/dL 16.5  15.0  15.5   Hematocrit 39.0 - 52.0 % 46.9  43.0  44.6   Platelets 150 - 400 K/uL 203  194  212        Latest Ref Rng & Units 06/21/2022   10:24 AM 03/21/2022    2:30 PM 08/31/2021    1:44 PM  CMP  Glucose 70 - 99 mg/dL 143  442    BUN 8 - 23 mg/dL 14  16    Creatinine 0.61 - 1.24 mg/dL 1.25  1.27    Sodium 135 - 145 mmol/L 141  136    Potassium 3.5 - 5.1 mmol/L 4.0  4.2    Chloride 98 - 111 mmol/L 103  99    CO2 22 - 32 mmol/L 33  33    Calcium 8.9 - 10.3 mg/dL 9.6  9.1    Total Protein 6.5 - 8.1 g/dL 7.2  7.0  7.4   Total Bilirubin 0.3 - 1.2 mg/dL 1.0  0.8  0.8   Alkaline Phos 38 - 126 U/L 152  152  135   AST 15 - 41 U/L 23  23  38   ALT 0 - 44 U/L 27  30  46    RADIOGRAPHIC STUDIES: I have personally reviewed the radiological images as listed and agreed with the findings in the report. No results found.  ASSESSMENT & PLAN Logan [name] 72 y.o. male with medical history significant for hereditary hemachromatosis presents for a follow up visit.  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  The patient was previously treated with phlebotomy when he was previously diagnosed by the GI department.  Due to elevations in ferritin he was referred to  hematology in order to manage his disorder.  Our recommendation would be for a goal ferritin of less than 50.  We will achieve this with phlebotomies performed every 2 weeks until he reaches target.    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 --RTC in 3 months for labs and 6 months for repeat clinic visit  No orders of the defined types were placed in this encounter.   All questions were answered. The patient knows to call the clinic with any problems, questions or concerns.  A total of more than 25 minutes were spent on this encounter with face-to-face time and non-face-to-face time, including preparing to see the patient, ordering tests and/or medications, counseling the patient and coordination of care as outlined above.   Ledell Peoples, MD Department of Hematology/Oncology [place] [facility] Phone: [phone] Pager: [phone] Email: [email or web address]  06/21/2022 11:44 AM

## 2022-06-24 ENCOUNTER — Telehealth: Payer: Self-pay | Admitting: *Deleted

## 2022-06-24 NOTE — Telephone Encounter (Signed)
-----  Message from Orson Slick, MD sent at 06/24/2022  7:49 AM EST ----- Please let Mr. Buckalew know that his ferritin is on target, currently <150. We will continue to monitor his iron levels with labs in April 2024.  ----- Message ----- From: Buel Ream, Lab [facility] [place] Sent: 06/21/2022  10:37 AM EST To: Orson Slick, MD

## 2022-06-24 NOTE — Telephone Encounter (Signed)
Notified of message below

## 2022-06-27 ENCOUNTER — Other Ambulatory Visit: Payer: Self-pay | Admitting: Internal Medicine

## 2022-06-27 ENCOUNTER — Telehealth: Payer: Self-pay | Admitting: Internal Medicine

## 2022-06-27 DIAGNOSIS — E039 Hypothyroidism, unspecified: Secondary | ICD-10-CM

## 2022-06-27 MED ORDER — LEVOTHYROXINE SODIUM 88 MCG PO TABS: 88.0000 ug | ORAL_TABLET | Freq: Every day | ORAL | 0 refills | Status: DC

## 2022-06-27 NOTE — Telephone Encounter (Signed)
Patient came in requesting a refill on his medication levothyroxine (SYNTHROID) 88 MCG tablet sent to his new pharmacy [facility], [address] AT [place]. Best Callback number for patient is [phone].

## 2022-07-13 ENCOUNTER — Other Ambulatory Visit: Payer: Self-pay

## 2022-07-13 ENCOUNTER — Encounter: Payer: Self-pay | Admitting: Internal Medicine

## 2022-07-13 ENCOUNTER — Ambulatory Visit (INDEPENDENT_AMBULATORY_CARE_PROVIDER_SITE_OTHER): Payer: Medicare Other | Admitting: Internal Medicine

## 2022-07-13 VITALS — BP 160/70 | HR 82 | Temp 97.7°F | Ht 74.0 in | Wt 175.8 lb

## 2022-07-13 DIAGNOSIS — L259 Unspecified contact dermatitis, unspecified cause: Secondary | ICD-10-CM | POA: Insufficient documentation

## 2022-07-13 DIAGNOSIS — E109 Type 1 diabetes mellitus without complications: Secondary | ICD-10-CM | POA: Diagnosis not present

## 2022-07-13 DIAGNOSIS — I10 Essential (primary) hypertension: Secondary | ICD-10-CM

## 2022-07-13 DIAGNOSIS — I70209 Unspecified atherosclerosis of native arteries of extremities, unspecified extremity: Secondary | ICD-10-CM

## 2022-07-13 DIAGNOSIS — E039 Hypothyroidism, unspecified: Secondary | ICD-10-CM | POA: Diagnosis not present

## 2022-07-13 DIAGNOSIS — E1051 Type 1 diabetes mellitus with diabetic peripheral angiopathy without gangrene: Secondary | ICD-10-CM

## 2022-07-13 DIAGNOSIS — L235 Allergic contact dermatitis due to other chemical products: Secondary | ICD-10-CM

## 2022-07-13 DIAGNOSIS — K635 Polyp of colon: Secondary | ICD-10-CM

## 2022-07-13 DIAGNOSIS — Z1211 Encounter for screening for malignant neoplasm of colon: Secondary | ICD-10-CM | POA: Insufficient documentation

## 2022-07-13 LAB — HEMOGLOBIN A1C: Hgb A1c MFr Bld: 9.4 % — ABNORMAL HIGH (ref 4.6–6.5)

## 2022-07-13 LAB — TSH: TSH: 4.7 u[IU]/mL (ref 0.35–5.50)

## 2022-07-13 MED ORDER — TOUJEO MAX SOLOSTAR 300 UNIT/ML ~~LOC~~ SOPN: 30.0000 [IU] | PEN_INJECTOR | Freq: Every day | SUBCUTANEOUS | 0 refills | Status: DC

## 2022-07-13 MED ORDER — TRIAMTERENE-HCTZ 37.5-25 MG PO TABS: 0.5000 | ORAL_TABLET | Freq: Every day | ORAL | 0 refills | Status: DC

## 2022-07-13 MED ORDER — IRBESARTAN 300 MG PO TABS: ORAL_TABLET | ORAL | 0 refills | Status: DC

## 2022-07-13 MED ORDER — LEVOTHYROXINE SODIUM 88 MCG PO TABS: 88.0000 ug | ORAL_TABLET | Freq: Every day | ORAL | 0 refills | Status: DC

## 2022-07-13 MED ORDER — NOVOLIN 70/30 (70-30) 100 UNIT/ML ~~LOC~~ SUSP: 20.0000 [IU] | Freq: Two times a day (BID) | SUBCUTANEOUS | 0 refills | Status: AC

## 2022-07-13 MED ORDER — FLUOCINONIDE EMULSIFIED BASE 0.05 % EX CREA: 1.0000 | TOPICAL_CREAM | Freq: Two times a day (BID) | CUTANEOUS | 0 refills | Status: DC

## 2022-07-13 MED ORDER — AMLODIPINE BESYLATE 10 MG PO TABS: 10.0000 mg | ORAL_TABLET | Freq: Every day | ORAL | 0 refills | Status: DC

## 2022-07-13 MED ORDER — ROSUVASTATIN CALCIUM 10 MG PO TABS: 10.0000 mg | ORAL_TABLET | Freq: Every day | ORAL | 3 refills | Status: DC

## 2022-07-13 MED ORDER — METHYLPREDNISOLONE ACETATE 80 MG/ML IJ SUSP
120.0000 mg | Freq: Once | INTRAMUSCULAR | Status: AC
  Administered 2022-07-13: 120 mg via INTRAMUSCULAR

## 2022-07-13 MED ORDER — LEVOCETIRIZINE DIHYDROCHLORIDE 5 MG PO TABS: 5.0000 mg | ORAL_TABLET | Freq: Every evening | ORAL | 0 refills | Status: DC

## 2022-07-13 NOTE — Progress Notes (Signed)
Subjective:  Patient ID: Logan [name], male    DOB: April 22, 1951  Age: 72 y.o. MRN: [id number]  CC: Rash, Hypertension, and Diabetes   HPI Logan [name] presents for f/up ----  A week ago he started using a new laundry detergent and he now complains of an itchy rash on his back.  He has not treated it.  He is very active and denies chest pain, shortness of breath, diaphoresis, or edema.  It sounds like he has changed pharmacies and needs some of his prescriptions refilled at a new pharmacy.  Outpatient Medications Prior to Visit  Medication Sig Dispense Refill   aspirin 81 MG tablet Take 81 mg by mouth 2 (two) times daily.     Continuous Blood Gluc Receiver (FREESTYLE LIBRE 14 DAY READER) DEVI      Continuous Blood Gluc Sensor (FREESTYLE LIBRE 14 DAY SENSOR) MISC      amLODipine (NORVASC) 10 MG tablet Take 1 tablet by mouth once daily 90 tablet 0   insulin glargine (LANTUS) 100 UNIT/ML injection INJECT 20 UNITS SUBCUTANEOUSLY ONCE DAILY AT BEDTIME 30 mL 0   insulin NPH-regular Human (NOVOLIN 70/30) (70-30) 100 UNIT/ML injection Inject 20 Units into the skin 2 (two) times daily with a meal. (Patient taking differently: Inject 18 Units into the skin 2 (two) times daily with a meal.) 30 mL 11   irbesartan (AVAPRO) 300 MG tablet TAKE 1 TABLET BY MOUTH ONCE DAILY . APPOINTMENT REQUIRED FOR FUTURE REFILLS 90 tablet 0   levothyroxine (SYNTHROID) 88 MCG tablet Take 1 tablet (88 mcg total) by mouth daily. 90 tablet 0   rosuvastatin (CRESTOR) 10 MG tablet Take 1 tablet (10 mg total) by mouth daily. 90 tablet 3   triamterene-hydrochlorothiazide (MAXZIDE-25) 37.5-25 MG tablet Take 0.5 tablets by mouth daily. 90 tablet 3   No facility-administered medications prior to visit.    ROS Review of Systems  Constitutional: Negative.  Negative for chills, diaphoresis, fatigue and unexpected weight change.  HENT: Negative.    Eyes: Negative.   Respiratory:  Negative for cough, chest tightness, shortness  of breath and wheezing.   Cardiovascular:  Negative for chest pain, palpitations and leg swelling.  Gastrointestinal:  Negative for abdominal pain, diarrhea, nausea and vomiting.  Endocrine: Negative.   Genitourinary: Negative.  Negative for difficulty urinating.  Musculoskeletal:  Positive for arthralgias. Negative for back pain and myalgias.  Skin:  Positive for rash. Negative for color change.  Neurological: Negative.  Negative for dizziness and weakness.  Hematological:  Negative for adenopathy. Does not bruise/bleed easily.  Psychiatric/Behavioral: Negative.      Objective:  BP (!) 160/70 (BP Location: Right Arm, Patient Position: Sitting, Cuff Size: Normal)   Pulse 82   Temp 97.7 F (36.5 C) (Oral)   Ht '6\' 2"'$  (1.88 m)   Wt 175 lb 12.8 oz (79.7 kg)   SpO2 98%   BMI 22.57 kg/m   BP Readings from Last 3 Encounters:  07/13/22 (!) 160/70  06/21/22 (!) 166/69  12/20/21 (!) 165/70    Wt Readings from Last 3 Encounters:  07/13/22 175 lb 12.8 oz (79.7 kg)  06/21/22 174 lb 11.2 oz (79.2 kg)  12/20/21 177 lb 14.4 oz (80.7 kg)    Physical Exam Vitals reviewed.  HENT:     Nose: Nose normal.     Mouth/Throat:     Mouth: Mucous membranes are moist.  Eyes:     General: No scleral icterus.    Conjunctiva/sclera: Conjunctivae normal.  Cardiovascular:  Rate and Rhythm: Normal rate.     Heart sounds: No murmur heard. Pulmonary:     Breath sounds: No wheezing, rhonchi or rales.  Abdominal:     General: Abdomen is flat.     Palpations: There is no mass.     Tenderness: There is no abdominal tenderness. There is no guarding.     Hernia: No hernia is present.  Musculoskeletal:        General: Normal range of motion.     Cervical back: Neck supple.     Right lower leg: No edema.     Left lower leg: No edema.  Lymphadenopathy:     Cervical: No cervical adenopathy.  Skin:    Findings: Erythema and rash present.     Comments: Across the back there are excoriated  erythematous macules and papules.  Neurological:     General: No focal deficit present.     Mental Status: He is alert.  Psychiatric:        Mood and Affect: Mood normal.        Behavior: Behavior normal.     Lab Results  Component Value Date   WBC 5.9 06/21/2022   HGB 16.5 06/21/2022   HCT 46.9 06/21/2022   PLT 203 06/21/2022   GLUCOSE 143 (H) 06/21/2022   CHOL 120 11/29/2021   TRIG 64.0 11/29/2021   HDL 52.60 11/29/2021   LDLCALC 55 11/29/2021   ALT 27 06/21/2022   AST 23 06/21/2022   NA 141 06/21/2022   K 4.0 06/21/2022   CL 103 06/21/2022   CREATININE 1.25 (H) 06/21/2022   BUN 14 06/21/2022   CO2 33 (H) 06/21/2022   TSH 4.70 07/13/2022   PSA 2.40 11/29/2021   HGBA1C 9.4 (H) 07/13/2022   MICROALBUR 8.0 (H) 11/29/2021    DG Chest Port 1 View  Result Date: 08/31/2021 CLINICAL DATA:  Nausea EXAM: PORTABLE CHEST 1 VIEW COMPARISON:  None. FINDINGS: Cardiac and mediastinal contours are within normal limits. No focal pulmonary opacity. No pleural effusion or pneumothorax. No acute osseous abnormality. IMPRESSION: No acute cardiopulmonary process. Electronically Signed   By: Merilyn Baba M.D.   On: 08/31/2021 13:55    Assessment & Plan:   Logan [name] was seen today for rash, hypertension and diabetes.  Diagnoses and all orders for this visit:  Allergic dermatitis due to other chemical product -     levocetirizine (XYZAL) 5 MG tablet; Take 1 tablet (5 mg total) by mouth every evening. -     fluocinonide-emollient (LIDEX-E) 0.05 % cream; Apply 1 Application topically 2 (two) times daily. -     methylPREDNISolone acetate (DEPO-MEDROL) injection 120 mg  Essential hypertension- Will try to get better control of his blood pressure. -     irbesartan (AVAPRO) 300 MG tablet; TAKE 1 TABLET BY MOUTH ONCE DAILY. -     amLODipine (NORVASC) 10 MG tablet; Take 1 tablet (10 mg total) by mouth daily. -     triamterene-hydrochlorothiazide (MAXZIDE-25) 37.5-25 MG tablet; Take 0.5 tablets by  mouth daily.  Type 1 diabetes mellitus without complication (HCC) -     Hemoglobin A1c; Future -     Hemoglobin A1c  Acquired hypothyroidism- He is euthyroid. -     TSH; Future -     TSH -     levothyroxine (SYNTHROID) 88 MCG tablet; Take 1 tablet (88 mcg total) by mouth daily.  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 -     insulin NPH-regular Human (NOVOLIN 70/30) (70-30) 100 UNIT/ML injection; Inject 20 Units into the skin 2 (two) times daily with a meal. -     insulin glargine, 2 Unit Dial, (TOUJEO MAX SOLOSTAR) 300 UNIT/ML Solostar Pen; Inject 30 Units into the skin daily.  Polyp of colon, unspecified part of colon, unspecified type -     Ambulatory referral to Gastroenterology   I have discontinued Logan [name]'s insulin glargine. I have also changed his irbesartan and amLODipine. Additionally, I am having him start on levocetirizine, fluocinonide-emollient, and Toujeo Max SoloStar. Lastly, I am having him maintain his aspirin, FreeStyle Libre 14 Day Reader, FreeStyle Libre 14 Day Sensor, triamterene-hydrochlorothiazide, levothyroxine, and NovoLIN 70/30. We administered methylPREDNISolone acetate.  Meds ordered this encounter  Medications   irbesartan (AVAPRO) 300 MG tablet    Sig: TAKE 1 TABLET BY MOUTH ONCE DAILY.    Dispense:  90 tablet    Refill:  0   amLODipine (NORVASC) 10 MG tablet    Sig: Take 1 tablet (10 mg total) by mouth daily.    Dispense:  90 tablet    Refill:  0   levocetirizine (XYZAL) 5 MG tablet    Sig: Take 1 tablet (5 mg total) by mouth every evening.    Dispense:  30 tablet    Refill:  0   fluocinonide-emollient (LIDEX-E) 0.05 % cream    Sig: Apply 1 Application topically 2 (two) times daily.    Dispense:  60 g    Refill:  0   methylPREDNISolone acetate (DEPO-MEDROL) injection 120 mg   triamterene-hydrochlorothiazide (MAXZIDE-25) 37.5-25 MG tablet    Sig:  Take 0.5 tablets by mouth daily.    Dispense:  90 tablet    Refill:  0   levothyroxine (SYNTHROID) 88 MCG tablet    Sig: Take 1 tablet (88 mcg total) by mouth daily.    Dispense:  90 tablet    Refill:  0   insulin NPH-regular Human (NOVOLIN 70/30) (70-30) 100 UNIT/ML injection    Sig: Inject 20 Units into the skin 2 (two) times daily with a meal.    Dispense:  48 mL    Refill:  0   insulin glargine, 2 Unit Dial, (TOUJEO MAX SOLOSTAR) 300 UNIT/ML Solostar Pen    Sig: Inject 30 Units into the skin daily.    Dispense:  9 mL    Refill:  0     Follow-up: Return in about 3 months (around 10/11/2022).  Scarlette Calico, MD

## 2022-07-13 NOTE — Patient Instructions (Signed)
Contact Dermatitis Dermatitis is redness, soreness, and swelling (inflammation) of the skin. Contact dermatitis is a reaction to certain substances that touch the skin. There are two types of this condition: Irritant contact dermatitis. This is the most common type. It happens when something irritates your skin, such as when your hands get dry from washing them too often with soap. You can get this type of reaction even if you have not been exposed to the irritant before. Allergic contact dermatitis. This type is caused by a substance that you are allergic to, such as poison ivy. It occurs when you have been exposed to the substance (allergen) and form a sensitivity to it. In some cases, the reaction may start soon after your first exposure to the allergen. In other cases, it may not start until you are exposed to the allergen again. It may then occur every time you are exposed to the allergen in the future. What are the causes? Irritant contact dermatitis is often caused by exposure to: Makeup. Soaps, detergents, and bleaches. Acids. Metal salts, such as nickel. Allergic contact dermatitis is often caused by exposure to: Poisonous plants. Chemicals. Jewelry. Latex. Medicines. Preservatives in products, such as clothes. What increases the risk? You are more likely to get this condition if you have: A job that exposes you to irritants or allergens. Certain medical conditions. These include asthma and eczema. What are the signs or symptoms? Symptoms of this condition may occur in any place on your body that has been touched by the irritant. Symptoms include: Dryness, flaking, or cracking. Redness. Itching. Pain or a burning feeling. Blisters. Drainage of small amounts of blood or clear fluid from skin cracks. With allergic contact dermatitis, there may also be swelling in areas such as the eyelids, mouth, or genitals. How is this diagnosed? This condition is diagnosed with a medical  history and physical exam. A patch skin test may be done to help figure out the cause. If the condition is related to your job, you may need to see an expert in health problems in the workplace (occupational medicine specialist). How is this treated? This condition is treated by staying away from the cause of the reaction and protecting your skin from further contact. Treatment may also include: Steroid creams or ointments. Steroid medicines may need be taken by mouth (orally) in more severe cases. Antibiotics or medicines applied to the skin to kill bacteria (antibacterial ointments). These may be needed if a skin infection is present. Antihistamines. These may be taken orally or put on as a lotion to ease itching. A bandage (dressing). Follow these instructions at home: Skin care Moisturize your skin as needed. Put cool, wet cloths (cool compresses) on the affected areas. Try applying baking soda paste to your skin. Stir water into baking soda until it has the consistency of a paste. Do not scratch your skin. Avoid friction to the affected area. Avoid the use of soaps, perfumes, and dyes. Check the affected areas every day for signs of infection. Check for: More redness, swelling, or pain. More fluid or blood. Warmth. Pus or a bad smell. Medicines Take or apply over-the-counter and prescription medicines only as told by your health care provider. If you were prescribed antibiotics, take or apply them as told by your health care provider. Do not stop using the antibiotic even if you start to feel better. Bathing Try taking a bath with: Epsom salts. Follow the instructions on the packaging. You can get these at your local pharmacy   or grocery store. Baking soda. Pour a small amount into the bath as told by your health care provider. Colloidal oatmeal. Follow the instructions on the packaging. You can get this at your local pharmacy or grocery store. Bathe less often. This may mean bathing  every other day. Bathe in lukewarm water. Avoid using hot water. Bandage care If you were given a dressing, change it as told by your health care provider. Wash your hands with soap and water for at least 20 seconds before and after you change your dressing. If soap and water are not available, use hand sanitizer. General instructions Avoid the substance that caused your reaction. If you do not know what caused it, keep a journal to try to track what caused it. Write down: What you eat and drink. What cosmetics you use. What you wear in the affected area. This includes jewelry. Contact a health care provider if: Your condition does not get better with treatment. Your condition gets worse. You have any signs of infection. You have a fever. You have new symptoms. Your bone or joint under the affected area becomes painful after the skin has healed. Get help right away if: You notice red streaks coming from the affected area. The affected area turns darker. You have trouble breathing. This information is not intended to replace advice given to you by your health care provider. Make sure you discuss any questions you have with your health care provider. Document Revised: 11/26/2021 Document Reviewed: 11/26/2021 Elsevier Patient Education  2023 [employer]  

## 2022-07-13 NOTE — Telephone Encounter (Signed)
Pt's medications were sent to pt's pharmacy as requested. Confirmation received.  

## 2022-07-18 ENCOUNTER — Telehealth: Payer: Self-pay

## 2022-07-18 NOTE — Telephone Encounter (Signed)
N/A unable to leave a message for patient to call back to schedule Medicare Annual Wellness Visit   Last AWV  07/03/18  Please schedule at anytime with [facility] [place] if patient calls the office back.    30 Minutes appointment   Any questions, please call me at [phone]

## 2022-08-24 ENCOUNTER — Other Ambulatory Visit: Payer: Self-pay | Admitting: Internal Medicine

## 2022-08-24 DIAGNOSIS — L235 Allergic contact dermatitis due to other chemical products: Secondary | ICD-10-CM

## 2022-08-31 NOTE — Progress Notes (Signed)
[facility] Note  09/02/2022     CHIEF COMPLAINT Patient presents for Retina Follow Up   HISTORY OF PRESENT ILLNESS: Logan [name] is a 72 y.o. male who presents to the clinic today for:   HPI     Retina Follow Up   Patient presents with  Diabetic Retinopathy.  In both eyes.  This started 6 months ago.  I, the attending physician,  performed the HPI with the patient and updated documentation appropriately.        Comments   Patient here for 6 months retina follow up for NPDR OU. Patient states vision no problems. 3 weeks ago when it was raining and dark trouble driving. No eye pain.       Last edited by Bernarda Caffey, MD on 09/02/2022 12:59 PM.    Pt states he is having trouble driving at night, especially when it rains, he states his vision fluctuates with his blood sugar, pts last A1c was "close to 8" and his blood sugar was 60 when he wake up this morning  Referring physician: Debbra Riding, MD [address] [place],  [place] [address]  HISTORICAL INFORMATION:   Selected notes from the MEDICAL RECORD NUMBER Referred by Dr. Wyatt Portela for concern of myopic degeneration LEE:  Ocular Hx- PMH-    CURRENT MEDICATIONS: No current outpatient medications on file. (Ophthalmic Drugs)   No current facility-administered medications for this visit. (Ophthalmic Drugs)   Current Outpatient Medications (Other)  Medication Sig   amLODipine (NORVASC) 10 MG tablet Take 1 tablet (10 mg total) by mouth daily.   aspirin 81 MG tablet Take 81 mg by mouth 2 (two) times daily.   insulin glargine, 2 Unit Dial, (TOUJEO MAX SOLOSTAR) 300 UNIT/ML Solostar Pen Inject 30 Units into the skin daily.   insulin NPH-regular Human (NOVOLIN 70/30) (70-30) 100 UNIT/ML injection Inject 20 Units into the skin 2 (two) times daily with a meal.   irbesartan (AVAPRO) 300 MG tablet TAKE 1 TABLET BY MOUTH ONCE DAILY.   levothyroxine (SYNTHROID) 88 MCG tablet Take 1  tablet (88 mcg total) by mouth daily.   rosuvastatin (CRESTOR) 10 MG tablet Take 1 tablet (10 mg total) by mouth daily.   triamterene-hydrochlorothiazide (MAXZIDE-25) 37.5-25 MG tablet Take 0.5 tablets by mouth daily.   Continuous Blood Gluc Receiver (FREESTYLE LIBRE 14 DAY READER) DEVI    Continuous Blood Gluc Sensor (FREESTYLE LIBRE 14 DAY SENSOR) MISC    fluocinonide-emollient (LIDEX-E) 0.05 % cream Apply 1 Application topically 2 (two) times daily. (Patient not taking: Reported on 09/02/2022)   levocetirizine (XYZAL) 5 MG tablet TAKE 1 TABLET(5 MG) BY MOUTH EVERY EVENING (Patient not taking: Reported on 09/02/2022)   No current facility-administered medications for this visit. (Other)   REVIEW OF SYSTEMS: ROS   Positive for: Endocrine, Cardiovascular, Eyes Last edited by Theodore Demark, COA on 09/02/2022  9:41 AM.      ALLERGIES No Known Allergies  PAST MEDICAL HISTORY Past Medical History:  Diagnosis Date   Aortic atherosclerosis ([place])    Cataract    Coronary artery disease involving native coronary artery of native heart without angina pectoris 11/01/2007   Last Cath '04: LAD w/ 60% long lesion; 1st Dx 70% ostial; 2nd Dx  70% ostial; Cx 30% ostial; RCA-dominant multiple 30-40% lesions; PDA 30% lesions  Last nuclear stress March '09 - negative for ischemia    Diabetes mellitus    Diabetic retinopathy  Hearing loss    Hemochromatosis    Hyperlipidemia    Hypertension    Tubular adenoma of colon    Past Surgical History:  Procedure Laterality Date   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   Procedure: Right Great Toe Amputation;  Surgeon: Newt Minion, MD;  Location: [facility];  Service: Orthopedics;  Laterality: Right;  Right Great Toe Amputation   INNER EAR SURGERY     right   FAMILY HISTORY Family History  Problem Relation Age of Onset   Asthma Mother    Heart failure Mother    Emphysema Mother    Diabetes Father    Alcohol abuse Father    Cancer Father        prostate    Stroke Father    Diabetes Sister    Diabetes Brother    Colon cancer Maternal Uncle        dx'd 34   Blindness Paternal Grandmother    Diabetes Paternal Grandmother    Heart disease Neg Hx    Rectal cancer Neg Hx    Stomach cancer Neg Hx    SOCIAL HISTORY Social History   Tobacco Use   Smoking status: Never   Smokeless tobacco: Never  Vaping Use   Vaping Use: Never used  Substance Use Topics   Alcohol use: Yes    Comment: occ beer but very rare   Drug use: No       OPHTHALMIC EXAM:  Base Eye Exam     Visual Acuity (Snellen - Linear)       Right Left   Dist cc 20/40 20/30 -2   Dist ph cc 20/30 -2 20/30 +1    Correction: Glasses         Tonometry (Tonopen, 9:40 AM)       Right Left   Pressure 17 22         Pupils       Dark Light Shape React APD   Right 3 2 Round Brisk None   Left 3 2 Round Brisk None         Visual Fields (Counting fingers)       Left Right    Full Full         Extraocular Movement       Right Left    Full, Ortho Full, Ortho         Neuro/Psych     Oriented x3: Yes   Mood/Affect: Normal         Dilation     Both eyes: 1.0% Mydriacyl, 2.5% Phenylephrine @ 9:40 AM           Slit Lamp and Fundus Exam     Slit Lamp Exam       Right Left   Lids/Lashes Dermatochalasis - upper lid, Meibomian gland dysfunction Dermatochalasis - upper lid, Meibomian gland dysfunction   Conjunctiva/Sclera White and quiet White and quiet   Cornea trace PEE, tear film debris, 1+ Guttata trace PEE, tear film debris, 1-2+ Guttata centrally   Anterior Chamber deep, clear, narrow temporal angle deep, clear, narrow temporal angle   Iris Round and dilated, No NVI Round and dilated, No NVI   Lens 2-3+ Nuclear sclerosis with brunescence, 2+ Cortical cataract, 1+ Posterior subcapsular cataract 3+ Nuclear sclerosis with brunescence, 2+ Cortical cataract, 1+ central posterior subcapsular cataract   Anterior Vitreous mild syneresis mild  syneresis         Fundus Exam       Right Left  Disc mild Pallor, Sharp rim, temporal PPA mild Pallor, Sharp rim, tilted, temporal PPA   C/D Ratio 0.4 0.3   Macula Flat, Blunted foveal reflex, pigmented focal laser scars, rare MA, no edema Flat, Blunted foveal reflex, RPE mottling, heavy pigmented focal laser scars temporal macula, no edema   Vessels attenuated, Tortuous, mild AV crossing changes, no NVE attenuated, Tortuous, no NVE   Periphery Attached, scattered punctate MA, paving stone degeneration inferiorly Attached, scattered punctate MA, paving stone degeneration inferiorly           Refraction     Wearing Rx       Sphere Cylinder Axis Add   Right -6.00 +1.25 153 +2.00   Left -6.25 +0.75 004 +2.00           IMAGING AND PROCEDURES  Imaging and Procedures for 09/02/2022  OCT, Retina - OU - Both Eyes       Right Eye Quality was good. Central Foveal Thickness: 255. Progression has been stable. Findings include no IRF, no SRF, abnormal foveal contour, myopic contour, retinal drusen , subretinal hyper-reflective material, pigment epithelial detachment, outer retinal atrophy, vitreomacular adhesion (Scattered focal laser changes (ORA, SRHM), no DME).   Left Eye Quality was good. Central Foveal Thickness: 274. Progression has been stable. Findings include no IRF, no SRF, abnormal foveal contour, myopic contour, retinal drusen , subretinal hyper-reflective material, outer retinal atrophy, vitreomacular adhesion (Scattered focal laser changes (ORA, SRHM), no DME).   Notes *Images captured and stored on drive  Diagnosis / Impression:  No IRF/SRF OU -- no DME OU Scattered focal laser changes (ORA, SRHM) OU  Clinical management:  See below  Abbreviations: NFP - Normal foveal profile. CME - cystoid macular edema. PED - pigment epithelial detachment. IRF - intraretinal fluid. SRF - subretinal fluid. EZ - ellipsoid zone. ERM - epiretinal membrane. ORA - outer retinal  atrophy. ORT - outer retinal tubulation. SRHM - subretinal hyper-reflective material. IRHM - intraretinal hyper-reflective material             ASSESSMENT/PLAN:    ICD-10-CM   1. Moderate nonproliferative diabetic retinopathy of both eyes without macular edema associated with type 2 diabetes mellitus (HCC)  [id number] OCT, Retina - OU - Both Eyes    2. Essential hypertension  I10     3. Hypertensive retinopathy of both eyes  H35.033     4. Uncomplicated degenerative myopia of both eyes  H44.23     5. Combined forms of age-related cataract of both eyes  H25.813      Moderate nonproliferative diabetic retinopathy w/o DME, both eyes  - hx of focal laser with Dr. Ishmael Holter OU (OS>OD) (1984) - exam shows scattered punctate MA, heavy focal laser OU, no NV  - FA (09.29.23) shows OD: Low fluorescein signal, No NVE, scattered MA with mild leakage; OS: Low fluorescein signal, No NVE, scattered MA with mild leakage, staining and window defect of focal laser changes temporal macula - OCT without diabetic macular edema, both eyes  - no retinal or ophthalmic interventions indicated or recommended - f/u in 6-9 mos -- DFE/OCT  2,3. Hypertensive retinopathy OU - discussed importance of tight BP control - monitor  4. Degenerative myopia OU  - no complications - monitor  5. Mixed Cataract OU - The symptoms of cataract, surgical options, and treatments and risks were discussed with patient. - discussed diagnosis and progression - under the expert management of Dr. Wyatt Portela - clear from a retina standpoint to proceed with cataract surgery  when pt and surgeon are ready   Ophthalmic Meds Ordered this visit:  No orders of the defined types were placed in this encounter.    Return for f/u 6-9 months, NPDR OU, DFE, OCT.  There are no Patient Instructions on file for this visit.   Explained the diagnoses, plan, and follow up with the patient and they expressed understanding.  Patient  expressed understanding of the importance of proper follow up care.   This document serves as a record of services personally performed by Gardiner Sleeper, MD, PhD. It was created on their behalf by San Jetty. Owens Shark, OA an ophthalmic technician. The creation of this record is the provider's dictation and/or activities during the visit.    Electronically signed by: San Jetty. Owens Shark, [place] 03.27.2024 1:00 PM   Gardiner Sleeper, M.D., Ph.D. Diseases & Surgery of the Retina and Vitreous [facility]  I have reviewed the above documentation for accuracy and completeness, and I agree with the above. Gardiner Sleeper, M.D., Ph.D. 09/02/22 1:00 PM   Abbreviations: M myopia (nearsighted); A astigmatism; H hyperopia (farsighted); P presbyopia; Mrx spectacle prescription;  CTL contact lenses; OD right eye; OS left eye; OU both eyes  XT exotropia; ET esotropia; PEK punctate epithelial keratitis; PEE punctate epithelial erosions; DES dry eye syndrome; MGD meibomian gland dysfunction; ATs artificial tears; PFAT's preservative free artificial tears; [place] nuclear sclerotic cataract; PSC posterior subcapsular cataract; ERM epi-retinal membrane; PVD posterior vitreous detachment; RD retinal detachment; DM diabetes mellitus; DR diabetic retinopathy; NPDR non-proliferative diabetic retinopathy; PDR proliferative diabetic retinopathy; CSME clinically significant macular edema; DME diabetic macular edema; dbh dot blot hemorrhages; CWS cotton wool spot; POAG primary open angle glaucoma; C/D cup-to-disc ratio; HVF humphrey visual field; GVF goldmann visual field; OCT optical coherence tomography; IOP intraocular pressure; BRVO Branch retinal vein occlusion; CRVO central retinal vein occlusion; CRAO central retinal artery occlusion; BRAO branch retinal artery occlusion; RT retinal tear; SB scleral buckle; PPV pars plana vitrectomy; VH Vitreous hemorrhage; PRP panretinal laser photocoagulation; IVK intravitreal  kenalog; VMT vitreomacular traction; MH Macular hole;  NVD neovascularization of the disc; NVE neovascularization elsewhere; AREDS age related eye disease study; ARMD age related macular degeneration; POAG primary open angle glaucoma; EBMD epithelial/anterior basement membrane dystrophy; ACIOL anterior chamber intraocular lens; IOL intraocular lens; PCIOL posterior chamber intraocular lens; Phaco/IOL phacoemulsification with intraocular lens placement; [place] photorefractive keratectomy; LASIK laser assisted in situ keratomileusis; HTN hypertension; DM diabetes mellitus; COPD chronic obstructive pulmonary disease

## 2022-09-02 ENCOUNTER — Encounter (INDEPENDENT_AMBULATORY_CARE_PROVIDER_SITE_OTHER): Payer: Self-pay | Admitting: Ophthalmology

## 2022-09-02 ENCOUNTER — Ambulatory Visit (INDEPENDENT_AMBULATORY_CARE_PROVIDER_SITE_OTHER): Payer: Medicare Other | Admitting: Ophthalmology

## 2022-09-02 DIAGNOSIS — H35033 Hypertensive retinopathy, bilateral: Secondary | ICD-10-CM | POA: Diagnosis not present

## 2022-09-02 DIAGNOSIS — H4423 Degenerative myopia, bilateral: Secondary | ICD-10-CM | POA: Diagnosis not present

## 2022-09-02 DIAGNOSIS — H25813 Combined forms of age-related cataract, bilateral: Secondary | ICD-10-CM

## 2022-09-02 DIAGNOSIS — E113393 Type 2 diabetes mellitus with moderate nonproliferative diabetic retinopathy without macular edema, bilateral: Secondary | ICD-10-CM | POA: Diagnosis not present

## 2022-09-02 DIAGNOSIS — I10 Essential (primary) hypertension: Secondary | ICD-10-CM

## 2022-09-15 DIAGNOSIS — I2510 Atherosclerotic heart disease of native coronary artery without angina pectoris: Secondary | ICD-10-CM | POA: Diagnosis not present

## 2022-09-15 DIAGNOSIS — E1140 Type 2 diabetes mellitus with diabetic neuropathy, unspecified: Secondary | ICD-10-CM | POA: Diagnosis not present

## 2022-09-15 DIAGNOSIS — E7800 Pure hypercholesterolemia, unspecified: Secondary | ICD-10-CM | POA: Diagnosis not present

## 2022-09-15 DIAGNOSIS — E11319 Type 2 diabetes mellitus with unspecified diabetic retinopathy without macular edema: Secondary | ICD-10-CM | POA: Diagnosis not present

## 2022-09-15 DIAGNOSIS — E109 Type 1 diabetes mellitus without complications: Secondary | ICD-10-CM | POA: Diagnosis not present

## 2022-09-15 DIAGNOSIS — I10 Essential (primary) hypertension: Secondary | ICD-10-CM | POA: Diagnosis not present

## 2022-09-15 DIAGNOSIS — E039 Hypothyroidism, unspecified: Secondary | ICD-10-CM | POA: Diagnosis not present

## 2022-09-19 ENCOUNTER — Other Ambulatory Visit: Payer: Self-pay | Admitting: Physician Assistant

## 2022-09-20 ENCOUNTER — Inpatient Hospital Stay: Payer: Medicare Other | Attending: Hematology and Oncology

## 2022-09-20 ENCOUNTER — Other Ambulatory Visit: Payer: Self-pay

## 2022-09-20 LAB — CBC WITH DIFFERENTIAL (CANCER CENTER ONLY)
Abs Immature Granulocytes: 0.02 10*3/uL (ref 0.00–0.07)
Basophils Absolute: 0.1 10*3/uL (ref 0.0–0.1)
Basophils Relative: 1 %
Eosinophils Absolute: 0.4 10*3/uL (ref 0.0–0.5)
Eosinophils Relative: 5 %
HCT: 43.2 % (ref 39.0–52.0)
Hemoglobin: 15.1 g/dL (ref 13.0–17.0)
Immature Granulocytes: 0 %
Lymphocytes Relative: 20 %
Lymphs Abs: 1.4 10*3/uL (ref 0.7–4.0)
MCH: 32.7 pg (ref 26.0–34.0)
MCHC: 35 g/dL (ref 30.0–36.0)
MCV: 93.5 fL (ref 80.0–100.0)
Monocytes Absolute: 0.8 10*3/uL (ref 0.1–1.0)
Monocytes Relative: 11 %
Neutro Abs: 4.6 10*3/uL (ref 1.7–7.7)
Neutrophils Relative %: 63 %
Platelet Count: 167 10*3/uL (ref 150–400)
RBC: 4.62 MIL/uL (ref 4.22–5.81)
RDW: 13.8 % (ref 11.5–15.5)
WBC Count: 7.3 10*3/uL (ref 4.0–10.5)
nRBC: 0 % (ref 0.0–0.2)

## 2022-09-20 LAB — FERRITIN: Ferritin: 62 ng/mL (ref 24–336)

## 2022-09-26 ENCOUNTER — Telehealth: Payer: Self-pay

## 2022-09-26 NOTE — Telephone Encounter (Signed)
-----   Message from Briant Cedar, PA-C sent at 09/26/2022 10:35 AM EDT ----- Please notify patient that ferritin is at targe, <150. Continue to monitor.  ----- Message ----- From: Interface, Lab In [place] Sent: 09/20/2022  11:14 AM EDT To: Briant Cedar, PA-C

## 2022-09-26 NOTE — Telephone Encounter (Signed)
Pt advised with VU 

## 2022-09-28 ENCOUNTER — Other Ambulatory Visit: Payer: Self-pay | Admitting: Internal Medicine

## 2022-09-28 DIAGNOSIS — E039 Hypothyroidism, unspecified: Secondary | ICD-10-CM

## 2022-09-30 ENCOUNTER — Other Ambulatory Visit: Payer: Self-pay | Admitting: Internal Medicine

## 2022-09-30 DIAGNOSIS — I70209 Unspecified atherosclerosis of native arteries of extremities, unspecified extremity: Secondary | ICD-10-CM

## 2022-10-11 ENCOUNTER — Other Ambulatory Visit: Payer: Self-pay | Admitting: Internal Medicine

## 2022-10-11 DIAGNOSIS — I10 Essential (primary) hypertension: Secondary | ICD-10-CM

## 2022-10-17 ENCOUNTER — Encounter: Payer: Self-pay | Admitting: Family Medicine

## 2022-10-17 ENCOUNTER — Ambulatory Visit (INDEPENDENT_AMBULATORY_CARE_PROVIDER_SITE_OTHER): Payer: Medicare Other | Admitting: Family Medicine

## 2022-10-17 VITALS — BP 120/64 | HR 80 | Temp 97.7°F | Resp 20 | Ht 74.0 in | Wt 179.0 lb

## 2022-10-17 DIAGNOSIS — H6122 Impacted cerumen, left ear: Secondary | ICD-10-CM

## 2022-10-17 DIAGNOSIS — H60393 Other infective otitis externa, bilateral: Secondary | ICD-10-CM

## 2022-10-17 MED ORDER — CORTISPORIN-TC 3.3-3-10-0.5 MG/ML OT SUSP: 5.0000 [drp] | Freq: Three times a day (TID) | OTIC | 0 refills | Status: AC

## 2022-10-17 NOTE — Progress Notes (Signed)
Assessment & Plan:  1. Otitis, externa, infective, bilateral Education provided on otitis externa.  - neomycin-colistin-hydrocortisone-thonzonium (CORTISPORIN-TC) 3.08-06-08-0.5 MG/ML OTIC suspension; Place 5 drops into both ears 3 (three) times daily for 10 days.  Dispense: 10 mL; Refill: 0  2. Impacted cerumen of left ear [H61.22] Left ear successful irrigated without complications.    Follow up plan: Return if symptoms worsen or fail to improve.  Logan Boston, MSN, APRN, FNP-C  Subjective:  HPI: Logan [name] is a 72 y.o. male presenting on 10/17/2022 for ears stopped up  (Some pressure on the R, no pain - mainly just itching in the ears x 1 week )  Patient reports ear pressure bilaterally (right > left) that started a week ago. He had some itching of the right ear canal that started yesterday. Denies pain. He has a history of ear infections and had tubes placed last year per his report.    ROS: Negative unless specifically indicated above in HPI.   Relevant past medical history reviewed and updated as indicated.   Allergies and medications reviewed and updated.   Current Outpatient Medications:    amLODipine (NORVASC) 10 MG tablet, TAKE 1 TABLET(10 MG) BY MOUTH DAILY, Disp: 90 tablet, Rfl: 0   aspirin 81 MG tablet, Take 81 mg by mouth 2 (two) times daily., Disp: , Rfl:    Continuous Blood Gluc Receiver (FREESTYLE LIBRE 14 DAY READER) DEVI, , Disp: , Rfl:    Continuous Blood Gluc Sensor (FREESTYLE LIBRE 14 DAY SENSOR) MISC, , Disp: , Rfl:    insulin glargine (LANTUS) 100 UNIT/ML injection, Inject 12 Units into the skin at bedtime., Disp: , Rfl:    insulin NPH-regular Human (NOVOLIN 70/30) (70-30) 100 UNIT/ML injection, Inject 20 Units into the skin 2 (two) times daily with a meal., Disp: 48 mL, Rfl: 0   irbesartan (AVAPRO) 300 MG tablet, TAKE 1 TABLET BY MOUTH EVERY DAY, Disp: 90 tablet, Rfl: 0   levothyroxine (SYNTHROID) 88 MCG tablet, TAKE 1 TABLET(88 MCG) BY MOUTH DAILY,  Disp: 90 tablet, Rfl: 0   rosuvastatin (CRESTOR) 10 MG tablet, Take 1 tablet (10 mg total) by mouth daily., Disp: 90 tablet, Rfl: 3   triamterene-hydrochlorothiazide (MAXZIDE-25) 37.5-25 MG tablet, Take 0.5 tablets by mouth daily., Disp: 90 tablet, Rfl: 0  No Known Allergies  Objective:   BP 120/64   Pulse 80   Temp 97.7 F (36.5 C)   Resp 20   Ht 6\' 2"  (1.88 m)   Wt 179 lb (81.2 kg)   BMI 22.98 kg/m    Physical Exam Vitals reviewed.  Constitutional:      General: He is not in acute distress.    Appearance: Normal appearance. He is not ill-appearing, toxic-appearing or diaphoretic.  HENT:     Head: Normocephalic and atraumatic.     Right Ear: Tympanic membrane and external ear normal. Swelling present. There is no impacted cerumen.     Left Ear: Tympanic membrane and external ear normal. Swelling present. There is impacted cerumen (cleared after irrigation).     Ears:     Comments: Erythema to ear canal Eyes:     General: No scleral icterus.       Right eye: No discharge.        Left eye: No discharge.     Conjunctiva/sclera: Conjunctivae normal.  Cardiovascular:     Rate and Rhythm: Normal rate.  Pulmonary:     Effort: Pulmonary effort is normal. No respiratory distress.  Musculoskeletal:  General: Normal range of motion.     Cervical back: Normal range of motion.  Skin:    General: Skin is warm and dry.  Neurological:     Mental Status: He is alert and oriented to person, place, and time. Mental status is at baseline.  Psychiatric:        Mood and Affect: Mood normal.        Behavior: Behavior normal.        Thought Content: Thought content normal.        Judgment: Judgment normal.

## 2022-11-29 NOTE — Progress Notes (Unsigned)
Cardiology Office Note   Date:  11/30/2022   ID:  Finley, Chevez 1951-05-11, MRN [id number]  PCP:  Etta Grandchild, MD  Cardiologist:   Dietrich Pates, MD    F?U of CAD and HTN     History of Present Illness: EDON HOADLEY is a 72 y.o. male with a history of history of hemachromatosis, DM, HL, HTN  CAD. 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 He had nonobstructive disease at catheterization in 2004. He had a negative Myoview scan in 2009 with an ejection fraction of 49%.   Pt has also had syncope in past Known bradycardia.  Pt was last seen in cardiology in Dec 2021  Since seen the pt remains active   Walking, working outside     Denies CP   Breathing is OK   Can walk 5 miles at at time    He did have a spell of diaphoresis    Was in driveway at time   It was cool outside   Had some tingling in arm   Went to ED  Trop neg  SUgar OK    PCP sugg may be related to ear problems   HE is having a very hard time hearring    (has tubes in ears that were impacted)    I saw the pt in clinic in April 2023    Patient deneis CP   No SOB    Walking   [place] a little steeper in his mind  Current Meds  Medication Sig   amLODipine (NORVASC) 10 MG tablet TAKE 1 TABLET(10 MG) BY MOUTH DAILY   aspirin 81 MG tablet Take 81 mg by mouth 2 (two) times daily.   Continuous Blood Gluc Receiver (FREESTYLE LIBRE 14 DAY READER) DEVI    Continuous Blood Gluc Sensor (FREESTYLE LIBRE 14 DAY SENSOR) MISC    Insulin Glargine (TOUJEO MAX SOLOSTAR [place]) Inject 19 Units into the skin at bedtime.   insulin NPH-regular Human (NOVOLIN 70/30) (70-30) 100 UNIT/ML injection Inject 20 Units into the skin 2 (two) times daily with a meal.   irbesartan (AVAPRO) 300 MG tablet TAKE 1 TABLET BY MOUTH EVERY DAY   levothyroxine (SYNTHROID) 88 MCG tablet TAKE 1 TABLET(88 MCG) BY MOUTH DAILY   rosuvastatin (CRESTOR) 10 MG tablet Take 1 tablet (10 mg total) by mouth daily.   triamterene-hydrochlorothiazide  (MAXZIDE-25) 37.5-25 MG tablet Take 0.5 tablets by mouth daily.     Allergies:   Patient has no known allergies.   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 He has never used smokeless tobacco. He reports current alcohol use. He reports that he does not use drugs.   Family History:  The patient's family history includes Alcohol abuse in his father; Asthma in his mother; Blindness in his paternal grandmother; Cancer in his father; Colon cancer in his maternal uncle; Diabetes in his brother, father, paternal grandmother, and sister; Emphysema in his mother; Heart failure in his mother; Stroke in his father.    ROS:  Please see the history of present illness. All other systems are reviewed and  Negative to the above problem except as noted.    PHYSICAL EXAM: VS:  There were no vitals taken for this visit.   BP 158/ 72   P 68  O2 97   Wt 176     BP yesterday   133/62    GEN: Thin 72 year old, in no acute distress HEENT: normal  Neck: JVP is normal   No carotid bruit Cardiac: RRR; no murmurs.  No LE edema  Respiratory:  clear to auscultation,  GI:  soft, nontender, nondistended, + BS  No hepatomegaly    EKG:  EKG is not ordered  Lipid Panel    Component Value Date/Time   CHOL 120 11/29/2021 0855   TRIG 64.0 11/29/2021 0855   HDL 52.60 11/29/2021 0855   CHOLHDL 2 11/29/2021 0855   VLDL 12.8 11/29/2021 0855   LDLCALC 55 11/29/2021 0855      Wt Readings from Last 3 Encounters:  10/17/22 179 lb (81.2 kg)  07/13/22 175 lb 12.8 oz (79.7 kg)  06/21/22 174 lb 11.2 oz (79.2 kg)      ASSESSMENT AND PLAN:  1  CAD Pt continues to do well   Some SOB with hills   Not at other times  Follow   2  HTN   BP is high today   Take BP daily  Bring in cuff and log in a few wks   Pt says when his me  3  HL  LDL 36   HDL 55   Trig 64  Excellent  control   4  Hx bradycardia   HR is OK   5.  DM   A1C in Feb 9.4  Follow by Talmage Nap  has appt soon  6  Hemacrhomatosisi   Follows with Dr Clydia Llano phlebotomy     ]\F/U next spring    Current medicines are reviewed at length with the patient today.  The patient does not have concerns regarding medicines.  Signed, Dietrich Pates, MD  11/30/2022 9:05 AM    [facility] [address] [place], [place], [place]  [address] Phone: [phone]; Fax: [phone]

## 2022-11-30 ENCOUNTER — Ambulatory Visit: Payer: Medicare Other | Attending: Internal Medicine | Admitting: Internal Medicine

## 2022-11-30 ENCOUNTER — Encounter: Payer: Self-pay | Admitting: Internal Medicine

## 2022-11-30 DIAGNOSIS — I950 Idiopathic hypotension: Secondary | ICD-10-CM | POA: Diagnosis not present

## 2022-11-30 DIAGNOSIS — I10 Essential (primary) hypertension: Secondary | ICD-10-CM | POA: Insufficient documentation

## 2022-11-30 NOTE — Patient Instructions (Signed)
Medication Instructions:  Your physician recommends that you continue on your current medications as directed. Please refer to the Current Medication list given to you today.  *If you need a refill on your cardiac medications before your next appointment, please call your pharmacy*  Lab Work: None ordered today.  Testing/Procedures: None ordered today.  Follow-Up: At [facility], you and your health needs are our priority.  As part of our continuing mission to provide you with exceptional heart care, we have created designated Provider Care Teams.  These Care Teams include your primary Cardiologist (physician) and Advanced Practice Providers (APPs -  Physician Assistants and Nurse Practitioners) who all work together to provide you with the care you need, when you need it.  Your next appointment:   4-6 weeks with Pharm D (HTN Clinic) 8-9 month(s)  The format for your next appointment:   In Person  Provider:   Dietrich Pates, MD {  Other Instructions You have been referred to our Hypertension Clinic (same location as Dr. Tenny Craw), you will meet with our pharmacist to review your home blood pressure readings, check the accuracy of your home blood pressure cuff and to help with blood pressure management. Please bring your home blood pressure cuff to this appointment.

## 2022-12-12 DIAGNOSIS — E039 Hypothyroidism, unspecified: Secondary | ICD-10-CM | POA: Diagnosis not present

## 2022-12-12 DIAGNOSIS — E109 Type 1 diabetes mellitus without complications: Secondary | ICD-10-CM | POA: Diagnosis not present

## 2022-12-12 LAB — HEMOGLOBIN A1C: Hemoglobin A1C: 8

## 2022-12-12 LAB — MICROALBUMIN, URINE: Microalb, Ur: 27.4

## 2022-12-21 DIAGNOSIS — E7800 Pure hypercholesterolemia, unspecified: Secondary | ICD-10-CM | POA: Diagnosis not present

## 2022-12-21 DIAGNOSIS — I2510 Atherosclerotic heart disease of native coronary artery without angina pectoris: Secondary | ICD-10-CM | POA: Diagnosis not present

## 2022-12-21 DIAGNOSIS — E11319 Type 2 diabetes mellitus with unspecified diabetic retinopathy without macular edema: Secondary | ICD-10-CM | POA: Diagnosis not present

## 2022-12-21 DIAGNOSIS — I10 Essential (primary) hypertension: Secondary | ICD-10-CM | POA: Diagnosis not present

## 2022-12-21 DIAGNOSIS — E1140 Type 2 diabetes mellitus with diabetic neuropathy, unspecified: Secondary | ICD-10-CM | POA: Diagnosis not present

## 2022-12-21 DIAGNOSIS — E109 Type 1 diabetes mellitus without complications: Secondary | ICD-10-CM | POA: Diagnosis not present

## 2022-12-21 DIAGNOSIS — E039 Hypothyroidism, unspecified: Secondary | ICD-10-CM | POA: Diagnosis not present

## 2022-12-22 ENCOUNTER — Other Ambulatory Visit: Payer: Self-pay | Admitting: Physician Assistant

## 2022-12-23 ENCOUNTER — Inpatient Hospital Stay: Payer: Medicare Other | Attending: Hematology and Oncology

## 2022-12-23 ENCOUNTER — Other Ambulatory Visit: Payer: Self-pay

## 2022-12-23 ENCOUNTER — Inpatient Hospital Stay (HOSPITAL_BASED_OUTPATIENT_CLINIC_OR_DEPARTMENT_OTHER): Payer: Medicare Other | Admitting: Physician Assistant

## 2022-12-23 DIAGNOSIS — Z800 Family history of malignant neoplasm of digestive organs: Secondary | ICD-10-CM | POA: Diagnosis not present

## 2022-12-23 DIAGNOSIS — Z8042 Family history of malignant neoplasm of prostate: Secondary | ICD-10-CM | POA: Diagnosis not present

## 2022-12-23 DIAGNOSIS — E1140 Type 2 diabetes mellitus with diabetic neuropathy, unspecified: Secondary | ICD-10-CM | POA: Insufficient documentation

## 2022-12-23 LAB — CBC WITH DIFFERENTIAL (CANCER CENTER ONLY)
Abs Immature Granulocytes: 0.02 10*3/uL (ref 0.00–0.07)
Basophils Absolute: 0.1 10*3/uL (ref 0.0–0.1)
Basophils Relative: 2 %
Eosinophils Absolute: 0.7 10*3/uL — ABNORMAL HIGH (ref 0.0–0.5)
Eosinophils Relative: 11 %
HCT: 45.6 % (ref 39.0–52.0)
Hemoglobin: 16 g/dL (ref 13.0–17.0)
Immature Granulocytes: 0 %
Lymphocytes Relative: 29 %
Lymphs Abs: 1.8 10*3/uL (ref 0.7–4.0)
MCH: 32.3 pg (ref 26.0–34.0)
MCHC: 35.1 g/dL (ref 30.0–36.0)
MCV: 92.1 fL (ref 80.0–100.0)
Monocytes Absolute: 0.9 10*3/uL (ref 0.1–1.0)
Monocytes Relative: 14 %
Neutro Abs: 2.9 10*3/uL (ref 1.7–7.7)
Neutrophils Relative %: 44 %
Platelet Count: 206 10*3/uL (ref 150–400)
RBC: 4.95 MIL/uL (ref 4.22–5.81)
RDW: 13.2 % (ref 11.5–15.5)
WBC Count: 6.5 10*3/uL (ref 4.0–10.5)
nRBC: 0 % (ref 0.0–0.2)

## 2022-12-23 LAB — FERRITIN: Ferritin: 81 ng/mL (ref 24–336)

## 2022-12-25 ENCOUNTER — Encounter: Payer: Self-pay | Admitting: Hematology and Oncology

## 2022-12-25 NOTE — Progress Notes (Signed)
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 Genetic testing shows patient is homozygous for C282Y 01/13/2021: establish care with Dr. Leonides Schanz  01/19/2021: Ferritin 129, Hgb 14.6 02/02/2021: Ferritin 78, Hgb 13.5 02/16/2021: Ferritin 53, Hgb 13.6 03/03/2022: last phlebotomy. Subsequent HELD as patient was at target.    Interval History:  Logan [name] 72 y.o. male with medical history significant for hereditary hemachromatosis presents for a follow up visit. The patient's last visit was on 06/21/2022. In the interim since the last visit he has not required any further phlebotomy.  On exam today Mr. Clauss reports he is doing well without any changes to his health. He reports his energy and appetite are stable. He is trying to stay active and complete all his ADLs on his own. He denies any nausea, vomiting or bowel habit changes. He denies any pain. 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 Overall he is at his baseline level of health with no questions or concerns today.  He currently denies any fevers, chills, sweats, shortness of breath, chest pain or cough.    A full 10 point ROS is listed below.  MEDICAL HISTORY:  Past Medical History:  Diagnosis Date   Aortic atherosclerosis (HCC)    Cataract    Coronary artery disease involving native coronary artery of native heart without angina pectoris 11/01/2007   Last Cath '04: LAD w/ 60% long lesion; 1st Dx 70% ostial; 2nd Dx  70% ostial; Cx 30% ostial; RCA-dominant multiple 30-40% lesions; PDA 30%  lesions  Last nuclear stress March '09 - negative for ischemia    Diabetes mellitus    Diabetic retinopathy    Hearing loss    Hemochromatosis    Hyperlipidemia    Hypertension    Tubular adenoma of colon     SURGICAL HISTORY: Past Surgical History:  Procedure Laterality Date   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   Procedure: Right Great Toe Amputation;  Surgeon: Nadara Mustard, MD;  Location: [facility];  Service: Orthopedics;  Laterality: Right;  Right Great Toe Amputation   INNER EAR SURGERY     right    SOCIAL HISTORY: Social History   Socioeconomic History   Marital status: Widowed    Spouse name: Not on file   Number of children: 1   Years of education: 60   Highest education level: Not on file  Occupational History   Occupation: retired    Associate Professor: [employer]  Tobacco Use   Smoking status: Never   Smokeless tobacco: Never  Vaping Use   Vaping status: Never Used  Substance and Sexual Activity   Alcohol use: Yes    Comment: occ beer but very rare   Drug use: No   Sexual activity: Yes    Partners: Female  Other Topics Concern   Not on file  Social History Narrative   HSG. Married - '77 - '04. 1 son '80. Work - semi-retired. Has a girlfriend.   Social Determinants of Health   Financial Resource Strain: Low Risk  (03/19/2020)   Overall Financial Resource Strain (CARDIA)    Difficulty of Paying Living Expenses: Not hard at  all  Food Insecurity: No Food Insecurity (11/25/2021)   Hunger Vital Sign    Worried About Running Out of Food in the Last Year: Never true    Ran Out of Food in the Last Year: Never true  Transportation Needs: No Transportation Needs (11/25/2021)   PRAPARE - Administrator, Civil Service (Medical): No    Lack of Transportation (Non-Medical): No  Physical Activity: Sufficiently Active (03/19/2020)   Exercise Vital Sign    Days of Exercise per Week: 3 days    Minutes of Exercise per Session: 60 min  Stress: Not on file  Social  Connections: Not on file  Intimate Partner Violence: Not At Risk (03/19/2020)   Humiliation, Afraid, Rape, and Kick questionnaire    Fear of Current or Ex-Partner: No    Emotionally Abused: No    Physically Abused: No    Sexually Abused: No    FAMILY HISTORY: Family History  Problem Relation Age of Onset   Asthma Mother    Heart failure Mother    Emphysema Mother    Diabetes Father    Alcohol abuse Father    Cancer Father        prostate   Stroke Father    Diabetes Sister    Diabetes Brother    Colon cancer Maternal Uncle        dx'd 70   Blindness Paternal Grandmother    Diabetes Paternal Grandmother    Heart disease Neg Hx    Rectal cancer Neg Hx    Stomach cancer Neg Hx     ALLERGIES:  has No Known Allergies.  MEDICATIONS:  Current Outpatient Medications  Medication Sig Dispense Refill   amLODipine (NORVASC) 10 MG tablet TAKE 1 TABLET(10 MG) BY MOUTH DAILY 90 tablet 0   aspirin 81 MG tablet Take 81 mg by mouth 2 (two) times daily.     Continuous Blood Gluc Receiver (FREESTYLE LIBRE 14 DAY READER) DEVI      Continuous Blood Gluc Sensor (FREESTYLE LIBRE 14 DAY SENSOR) MISC      Insulin Glargine (TOUJEO MAX SOLOSTAR [place]) Inject 19 Units into the skin at bedtime.     insulin NPH-regular Human (NOVOLIN 70/30) (70-30) 100 UNIT/ML injection Inject 20 Units into the skin 2 (two) times daily with a meal. 48 mL 0   irbesartan (AVAPRO) 300 MG tablet TAKE 1 TABLET BY MOUTH EVERY DAY 90 tablet 0   levothyroxine (SYNTHROID) 88 MCG tablet TAKE 1 TABLET(88 MCG) BY MOUTH DAILY 90 tablet 0   rosuvastatin (CRESTOR) 10 MG tablet Take 1 tablet (10 mg total) by mouth daily. 90 tablet 3   triamterene-hydrochlorothiazide (MAXZIDE-25) 37.5-25 MG tablet Take 0.5 tablets by mouth daily. 90 tablet 0   No current facility-administered medications for this visit.    REVIEW OF SYSTEMS:   Constitutional: ( - ) fevers, ( - )  chills , ( - ) night sweats Eyes: ( - ) blurriness of vision, ( - )  double vision, ( - ) watery eyes Ears, nose, mouth, throat, and face: ( - ) mucositis, ( - ) sore throat Respiratory: ( - ) cough, ( - ) dyspnea, ( - ) wheezes Cardiovascular: ( - ) palpitation, ( - ) chest discomfort, ( - ) lower extremity swelling Gastrointestinal:  ( - ) nausea, ( - ) heartburn, ( - ) change in bowel habits Skin: ( - ) abnormal skin rashes Lymphatics: ( - ) new lymphadenopathy, ( - ) easy bruising Neurological: ( - )  numbness, ( - ) tingling, ( - ) new weaknesses Behavioral/Psych: ( - ) mood change, ( - ) new changes  All other systems were reviewed with the patient and are negative.  PHYSICAL EXAMINATION: ECOG PERFORMANCE STATUS: 0 - Asymptomatic  Vitals:   12/23/22 1032 12/23/22 1037  BP: (!) 151/60 (!) 150/68  Pulse: 64   Resp: 17   Temp: 98 F (36.7 C)   SpO2: 98%    Filed Weights   12/23/22 1032  Weight: 177 lb 1.6 oz (80.3 kg)    GENERAL: Well-appearing elderly Caucasian male, alert, no distress and comfortable SKIN: skin color, texture, turgor are normal, no rashes or significant lesions EYES: conjunctiva are pink and non-injected, sclera clear LUNGS: clear to auscultation and percussion with normal breathing effort HEART: regular rate & rhythm and no murmurs and no lower extremity edema. Musculoskeletal: no cyanosis of digits and no clubbing  PSYCH: alert & oriented x 3, fluent speech NEURO: no focal motor/sensory deficits  LABORATORY DATA:  I have reviewed the data as listed    Latest Ref Rng & Units 12/23/2022    9:49 AM 09/20/2022   11:04 AM 06/21/2022   10:24 AM  CBC  WBC 4.0 - 10.5 K/uL 6.5  7.3  5.9   Hemoglobin 13.0 - 17.0 g/dL 40.9  81.1  91.4   Hematocrit 39.0 - 52.0 % 45.6  43.2  46.9   Platelets 150 - 400 K/uL 206  167  203        Latest Ref Rng & Units 06/21/2022   10:24 AM 03/21/2022    2:30 PM 08/31/2021    1:44 PM  CMP  Glucose 70 - 99 mg/dL 782  956    BUN 8 - 23 mg/dL 14  16    Creatinine 2.13 - 1.24 mg/dL 0.86  5.78     Sodium 469 - 145 mmol/L 141  136    Potassium 3.5 - 5.1 mmol/L 4.0  4.2    Chloride 98 - 111 mmol/L 103  99    CO2 22 - 32 mmol/L 33  33    Calcium 8.9 - 10.3 mg/dL 9.6  9.1    Total Protein 6.5 - 8.1 g/dL 7.2  7.0  7.4   Total Bilirubin 0.3 - 1.2 mg/dL 1.0  0.8  0.8   Alkaline Phos 38 - 126 U/L 152  152  135   AST 15 - 41 U/L 23  23  38   ALT 0 - 44 U/L 27  30  46    RADIOGRAPHIC STUDIES: I have personally reviewed the radiological images as listed and agreed with the findings in the report. No results found.  ASSESSMENT & PLAN HARLOW CARRIZALES 72 y.o. male with medical history significant for hereditary hemachromatosis presents for a follow up visit.  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  --RTC in 3 months for labs and 6 months for repeat clinic visit  No orders of the defined types were placed in this encounter.   All questions were answered. The patient knows to call the clinic with any problems, questions or concerns.  A total of more than 25 minutes were spent on this encounter with face-to-face time  and non-face-to-face time, including preparing to see the patient, ordering tests and/or medications, counseling the patient and coordination of care as outlined above.   Georga Kaufmann PA-C Dept of Hematology and Oncology [facility] Phone: [phone]   12/25/2022 4:55 PM

## 2022-12-26 ENCOUNTER — Telehealth: Payer: Self-pay

## 2022-12-26 NOTE — Telephone Encounter (Signed)
-----   Message from Briant Cedar sent at 12/26/2022  9:08 AM EDT ----- Please notify patient that ferritin levels are still within range (<150) and we can monitor for now. No need to restart phlebotomies at this time. ----- Message ----- From: Leory Plowman, Lab In [place] Sent: 12/23/2022  10:12 AM EDT To: Briant Cedar, PA-C

## 2022-12-26 NOTE — Telephone Encounter (Signed)
Pt advised with VU 

## 2022-12-27 NOTE — Progress Notes (Unsigned)
Patient ID: Logan [name]                 DOB: [birth year]                      MRN: [id number]     HPI: Logan [name] is a 72 y.o. male referred by Dr. Tenny Craw to HTN clinic. PMH is significant for CAD s/p anterior MI in 2001 treated with PTCA of LAD, DM, HTN, HLD, bradycardia, syncope, and hemachromatosis. BP was noted to be elevated at last visit on 11/30/22 however no BP was logged in the encounter. No med changes were made and he was referred to PharmD.  SCr stable at 1.25 in Jan 2024. K 4, Na 141 Trouble hearing Replace with chlorthal 25, or can add on spiro 25  Current HTN meds:  Amlodipine 10mg  daily Irbesartan 300mg  daily Triamterene-hydrochlorothiazide 37.5-25mg  1/2 tablet daily  BP goal: <130/58mmHg  Family History: Alcohol abuse in his father; Asthma in his mother; Blindness in his paternal grandmother; Cancer in his father; Colon cancer in his maternal uncle; Diabetes in his brother, father, paternal grandmother, and sister; Emphysema in his mother; Heart failure in his mother; Stroke in his father.   Social History: No tobacco or drug use. + alcohol  Diet:   Exercise: Walking and working outside  Home BP readings:   Wt Readings from Last 3 Encounters:  12/23/22 177 lb 1.6 oz (80.3 kg)  10/17/22 179 lb (81.2 kg)  07/13/22 175 lb 12.8 oz (79.7 kg)   BP Readings from Last 3 Encounters:  12/23/22 (!) 150/68  10/17/22 120/64  07/13/22 (!) 160/70   Pulse Readings from Last 3 Encounters:  12/23/22 64  10/17/22 80  07/13/22 82    Renal function: CrCl cannot be calculated (Patient's most recent lab result is older than the maximum 21 days allowed.).  Past Medical History:  Diagnosis Date   Aortic atherosclerosis (HCC)    Cataract    Coronary artery disease involving native coronary artery of native heart without angina pectoris 11/01/2007   Last Cath '04: LAD w/ 60% long lesion; 1st Dx 70% ostial; 2nd Dx  70% ostial; Cx 30% ostial; RCA-dominant multiple 30-40%  lesions; PDA 30% lesions  Last nuclear stress March '09 - negative for ischemia    Diabetes mellitus    Diabetic retinopathy    Hearing loss    Hemochromatosis    Hyperlipidemia    Hypertension    Tubular adenoma of colon     Current Outpatient Medications on File Prior to Visit  Medication Sig Dispense Refill   amLODipine (NORVASC) 10 MG tablet TAKE 1 TABLET(10 MG) BY MOUTH DAILY 90 tablet 0   aspirin 81 MG tablet Take 81 mg by mouth 2 (two) times daily.     Continuous Blood Gluc Receiver (FREESTYLE LIBRE 14 DAY READER) DEVI      Continuous Blood Gluc Sensor (FREESTYLE LIBRE 14 DAY SENSOR) MISC      Insulin Glargine (TOUJEO MAX SOLOSTAR [place]) Inject 19 Units into the skin at bedtime.     insulin NPH-regular Human (NOVOLIN 70/30) (70-30) 100 UNIT/ML injection Inject 20 Units into the skin 2 (two) times daily with a meal. 48 mL 0   irbesartan (AVAPRO) 300 MG tablet TAKE 1 TABLET BY MOUTH EVERY DAY 90 tablet 0   levothyroxine (SYNTHROID) 88 MCG tablet TAKE 1 TABLET(88 MCG) BY MOUTH DAILY 90 tablet 0   rosuvastatin (CRESTOR) 10 MG tablet Take 1 tablet (  10 mg total) by mouth daily. 90 tablet 3   triamterene-hydrochlorothiazide (MAXZIDE-25) 37.5-25 MG tablet Take 0.5 tablets by mouth daily. 90 tablet 0   No current facility-administered medications on file prior to visit.    No Known Allergies   Assessment/Plan:  1. Hypertension -

## 2022-12-28 ENCOUNTER — Ambulatory Visit: Payer: Medicare Other | Attending: Internal Medicine | Admitting: Pharmacist

## 2022-12-28 ENCOUNTER — Other Ambulatory Visit: Payer: Self-pay | Admitting: Internal Medicine

## 2022-12-28 VITALS — BP 164/62 | HR 86

## 2022-12-28 DIAGNOSIS — I10 Essential (primary) hypertension: Secondary | ICD-10-CM | POA: Diagnosis not present

## 2022-12-28 MED ORDER — ROSUVASTATIN CALCIUM 10 MG PO TABS: 10.0000 mg | ORAL_TABLET | Freq: Every day | ORAL | 3 refills | Status: DC

## 2022-12-28 MED ORDER — TRIAMTERENE-HCTZ 37.5-25 MG PO TABS
1.0000 | ORAL_TABLET | Freq: Every day | ORAL | 3 refills | Status: DC
Start: 2022-12-28 — End: 2023-07-04

## 2022-12-28 NOTE — Patient Instructions (Addendum)
Your blood pressure goal is < 130/37mmHg   Increase your triamterene-hydrochlorothiazide from 1/2 tablet to 1 tablet daily  Continue taking your other medications and record your blood pressure  Follow up in clinic in 4 weeks for blood pressure check  Important lifestyle changes to control high blood pressure  Intervention  Effect on the BP   Weight loss Weight loss is one of the most effective lifestyle changes for controlling blood pressure. If you're overweight or obese, losing even a small amount of weight can help reduce blood pressure.    Blood pressure can decrease by 1 millimeter of mercury (mmHg) with each kilogram (about 2.2 pounds) of weight lost.   Exercise regularly As a general goal, aim for 30 minutes of moderate physical activity every day.    Regular physical activity can lower blood pressure by 5 - 8 mmHg.   Eat a healthy diet Eat a diet rich in whole grains, fruits, vegetables, lean meat, and low-fat dairy products. Limit processed foods, saturated fat, and sweets.    A heart-healthy diet can lower high blood pressure by 10 mmHg.   Reduce salt (sodium) in your diet Aim for 000mg  of sodium each day. Avoid deli meats, canned food, and frozen microwave meals which are high in sodium.     Limiting sodium can reduce blood pressure by 5 mmHg.   Limit alcohol One drink equals 12 ounces of beer, 5 ounces of wine, or 1.5 ounces of 80-proof liquor.    Limiting alcohol to < 1 drink a day for women or < 2 drinks a day for men can help lower blood pressure by about 4 mmHg.   To check your pressure at home you will need to:   Sit up in a chair, with feet flat on the floor and back supported. Do not cross your ankles or legs. Rest your left arm so that the cuff is about heart level. If the cuff goes on your upper arm, then just relax your arm on the table, arm of the chair, or your lap. If you have a wrist cuff, hold your wrist against your chest at heart  level. Place the cuff snugly around your arm, about 1 inch above the crease of your elbow. The cords should be inside the groove of your elbow.  Sit quietly, with the cuff in place, for about 5 minutes. Then press the power button to start a reading. Do not talk or move while the reading is taking place.  Record your readings on a sheet of paper. Although most cuffs have a memory, it is often easier to see a pattern developing when the numbers are all in front of you.  You can repeat the reading after 1-3 minutes if it is recommended.   Make sure your bladder is empty and you have not had caffeine or tobacco within the last 30 minutes   Always bring your blood pressure log with you to your appointments. If you have not brought your monitor in to be double checked for accuracy, please bring it to your next appointment.   You can find a list of validated (accurate) blood pressure cuffs at: validatebp.org

## 2023-01-05 ENCOUNTER — Telehealth: Payer: Self-pay | Admitting: Internal Medicine

## 2023-01-05 DIAGNOSIS — E039 Hypothyroidism, unspecified: Secondary | ICD-10-CM

## 2023-01-05 NOTE — Telephone Encounter (Signed)
Caller & Relationship to patient: Self  Call back number: [phone]   Date of last office visit: 2.7.24  Date of next office visit: N/A  Medication(s) to be refilled:  levothyroxine (SYNTHROID) 88 MCG tablet   Preferred Pharmacy:   [id number]  Phone: [phone]  Fax: [phone]

## 2023-01-06 MED ORDER — LEVOTHYROXINE SODIUM 88 MCG PO TABS
88.0000 ug | ORAL_TABLET | Freq: Every day | ORAL | 0 refills | Status: DC
Start: 2023-01-06 — End: 2023-04-05

## 2023-01-06 NOTE — Telephone Encounter (Signed)
Sent refill to [facility].../lmb 

## 2023-01-24 NOTE — Progress Notes (Unsigned)
Patient ID: Logan [name]                 DOB: 1950/12/31                      MRN: [id number]     HPI: [name] is a 72 y.o. male referred by Dr. Tenny Craw to HTN clinic. PMH is significant for CAD s/p anterior MI in 2001 treated with PTCA of LAD, DM, HTN, HLD, bradycardia, syncope, and hemachromatosis. BP was noted to be elevated at last visit on 11/30/22 however no BP was logged in the encounter, pt reported he had not taken any of his BP meds that day. No med changes were made and he was referred to PharmD. At follow up visit with oncology 12/23/22 BP elevated at 150/68. Last pharmacist visit on 12/28/22. BP in clinic initially 138/64 increased to 164/62 on recheck. Home readings with some at goal and most above goal. Pt reported tolerating medications well. Noted he previously took a BP med that caused cramping in his legs, and another that worked well, but dropped his pressure too low in the summer heat, but could not recall name of either medication. Given elevated BP, triamterene/hydrochlorothiazide 37.5-25 mg was increased from 1/2 tab to 1 tab daily. Irbesartan 300 mg and amlodipine 10 mg continued.  Today *** Check updated BMET today given increased triameterene/hydrochlorothiazide dose  Home log of readings below:  7/17: 132/61, 123/68 (MD office), 150/67, 144/60 7/18: 140/73, 154/63, 134/56 7/19: 118/60, 150/68, 122/52 7/20: 123/53, 150/64 7/21: 132/56, 141/62, 142/58 7/22: 126/66, 145/59, 142/66 7/23: 141/64, 143/63, 141/64 7/24: 141/60  Current HTN meds:  Amlodipine 10mg  daily (AM) Irbesartan 300mg  daily (AM) Triamterene-hydrochlorothiazide 37.5-25mg  1 tablet daily (AM)  BP goal: <130/34mmHg  Family History: Alcohol abuse in his father; Asthma in his mother; Blindness in his paternal grandmother; Cancer in his father; Colon cancer in his maternal uncle; Diabetes in his brother, father, paternal grandmother, and sister; Emphysema in his mother; Heart failure in his mother;  Stroke in his father.   Social History: No tobacco or drug use. + alcohol  Diet: Salts cucumber, egg, and tomato. Doesn't salt other food. Does like going out to eat. Rinses canned food. 1 cup of coffee in the AM.  Exercise: Walking and working outside. Walks 4 miles.  Home BP readings:   Wt Readings from Last 3 Encounters:  12/23/22 177 lb 1.6 oz (80.3 kg)  10/17/22 179 lb (81.2 kg)  07/13/22 175 lb 12.8 oz (79.7 kg)   BP Readings from Last 3 Encounters:  12/28/22 (!) 164/62  12/23/22 (!) 150/68  10/17/22 120/64   Pulse Readings from Last 3 Encounters:  12/28/22 86  12/23/22 64  10/17/22 80    Renal function: CrCl cannot be calculated (Patient's most recent lab result is older than the maximum 21 days allowed.).  Past Medical History:  Diagnosis Date   Aortic atherosclerosis (HCC)    Cataract    Coronary artery disease involving native coronary artery of native heart without angina pectoris 11/01/2007   Last Cath '04: LAD w/ 60% long lesion; 1st Dx 70% ostial; 2nd Dx  70% ostial; Cx 30% ostial; RCA-dominant multiple 30-40% lesions; PDA 30% lesions  Last nuclear stress March '09 - negative for ischemia    Diabetes mellitus    Diabetic retinopathy    Hearing loss    Hemochromatosis    Hyperlipidemia    Hypertension    Tubular adenoma of colon  Current Outpatient Medications on File Prior to Visit  Medication Sig Dispense Refill   amLODipine (NORVASC) 10 MG tablet TAKE 1 TABLET(10 MG) BY MOUTH DAILY 90 tablet 0   aspirin 81 MG tablet Take 81 mg by mouth 2 (two) times daily.     Continuous Blood Gluc Receiver (FREESTYLE LIBRE 14 DAY READER) DEVI      Continuous Blood Gluc Sensor (FREESTYLE LIBRE 14 DAY SENSOR) MISC      Insulin Glargine (TOUJEO MAX SOLOSTAR [place]) Inject 19 Units into the skin at bedtime.     insulin NPH-regular Human (NOVOLIN 70/30) (70-30) 100 UNIT/ML injection Inject 20 Units into the skin 2 (two) times daily with a meal. 48 mL 0   irbesartan  (AVAPRO) 300 MG tablet TAKE 1 TABLET BY MOUTH EVERY DAY 90 tablet 0   levothyroxine (SYNTHROID) 88 MCG tablet Take 1 tablet (88 mcg total) by mouth daily before breakfast. Follow-up appt due in must see provider for future refills 90 tablet 0   rosuvastatin (CRESTOR) 10 MG tablet Take 1 tablet (10 mg total) by mouth daily. 90 tablet 3   triamterene-hydrochlorothiazide (MAXZIDE-25) 37.5-25 MG tablet Take 1 tablet by mouth daily. 90 tablet 3   No current facility-administered medications on file prior to visit.    No Known Allergies   Assessment/Plan:  1. Hypertension - BP above goal <130/70mmHg in clinic, at least half of home readings are elevated as well. First SBP in clinic in the upper 130s, increased notably to the 160s on recheck 5 minutes later. Will increase triamterene-hydrochlorothiazide from 1/2 tablet to 1 tablet daily. Continue amlodipine 10mg  daily and irbesartan 300mg  daily. Encouraged low sodium diet < 2g daily and continued physical activity. Pt will continue monitoring BP at home and bring in log to f/u visit in 4 weeks for BP check and BMET.

## 2023-01-25 ENCOUNTER — Ambulatory Visit: Payer: Medicare Other | Attending: Internal Medicine | Admitting: Pharmacist

## 2023-01-25 VITALS — BP 134/72 | HR 81

## 2023-01-25 DIAGNOSIS — I10 Essential (primary) hypertension: Secondary | ICD-10-CM | POA: Diagnosis not present

## 2023-01-25 NOTE — Patient Instructions (Addendum)
Your blood pressure goal is < 130/73mmHg   Your blood pressure is excellent today and at home! Please continue taking your current medications  Important lifestyle changes to control high blood pressure  Intervention  Effect on the BP   Weight loss Weight loss is one of the most effective lifestyle changes for controlling blood pressure. If you're overweight or obese, losing even a small amount of weight can help reduce blood pressure.    Blood pressure can decrease by 1 millimeter of mercury (mmHg) with each kilogram (about 2.2 pounds) of weight lost.   Exercise regularly As a general goal, aim for 30 minutes of moderate physical activity every day.    Regular physical activity can lower blood pressure by 5 - 8 mmHg.   Eat a healthy diet Eat a diet rich in whole grains, fruits, vegetables, lean meat, and low-fat dairy products. Limit processed foods, saturated fat, and sweets.    A heart-healthy diet can lower high blood pressure by 10 mmHg.   Reduce salt (sodium) in your diet Aim for 000mg  of sodium each day. Avoid deli meats, canned food, and frozen microwave meals which are high in sodium.     Limiting sodium can reduce blood pressure by 5 mmHg.   Limit alcohol One drink equals 12 ounces of beer, 5 ounces of wine, or 1.5 ounces of 80-proof liquor.    Limiting alcohol to < 1 drink a day for women or < 2 drinks a day for men can help lower blood pressure by about 4 mmHg.   To check your pressure at home you will need to:   Sit up in a chair, with feet flat on the floor and back supported. Do not cross your ankles or legs. Rest your left arm so that the cuff is about heart level. If the cuff goes on your upper arm, then just relax your arm on the table, arm of the chair, or your lap. If you have a wrist cuff, hold your wrist against your chest at heart level. Place the cuff snugly around your arm, about 1 inch above the crease of your elbow. The cords should be inside the  groove of your elbow.  Sit quietly, with the cuff in place, for about 5 minutes. Then press the power button to start a reading. Do not talk or move while the reading is taking place.  Record your readings on a sheet of paper. Although most cuffs have a memory, it is often easier to see a pattern developing when the numbers are all in front of you.  You can repeat the reading after 1-3 minutes if it is recommended.   Make sure your bladder is empty and you have not had caffeine or tobacco within the last 30 minutes   Always bring your blood pressure log with you to your appointments. If you have not brought your monitor in to be double checked for accuracy, please bring it to your next appointment.   You can find a list of validated (accurate) blood pressure cuffs at: validatebp.org

## 2023-01-26 ENCOUNTER — Telehealth: Payer: Self-pay | Admitting: Pharmacist

## 2023-01-26 LAB — BASIC METABOLIC PANEL
BUN/Creatinine Ratio: 14 (ref 10–24)
BUN: 19 mg/dL (ref 8–27)
CO2: 24 mmol/L (ref 20–29)
Calcium: 9.5 mg/dL (ref 8.6–10.2)
Chloride: 100 mmol/L (ref 96–106)
Creatinine, Ser: 1.33 mg/dL — ABNORMAL HIGH (ref 0.76–1.27)
Glucose: 59 mg/dL — ABNORMAL LOW (ref 70–99)
Potassium: 3.8 mmol/L (ref 3.5–5.2)
Sodium: 141 mmol/L (ref 134–144)
eGFR: 57 mL/min/{1.73_m2} — ABNORMAL LOW (ref >59)

## 2023-01-26 NOTE — Telephone Encounter (Signed)
Called pt and left message. Need to let him know BMET from yesterday looks good, renal function stable and K wnl.

## 2023-02-03 NOTE — Telephone Encounter (Signed)
Have tried reaching patient a few times, no answer.

## 2023-03-02 ENCOUNTER — Encounter (INDEPENDENT_AMBULATORY_CARE_PROVIDER_SITE_OTHER): Payer: Self-pay | Admitting: Ophthalmology

## 2023-03-02 ENCOUNTER — Ambulatory Visit (INDEPENDENT_AMBULATORY_CARE_PROVIDER_SITE_OTHER): Payer: Medicare Other | Admitting: Ophthalmology

## 2023-03-02 DIAGNOSIS — H25813 Combined forms of age-related cataract, bilateral: Secondary | ICD-10-CM

## 2023-03-02 DIAGNOSIS — I10 Essential (primary) hypertension: Secondary | ICD-10-CM

## 2023-03-02 DIAGNOSIS — H4423 Degenerative myopia, bilateral: Secondary | ICD-10-CM

## 2023-03-02 DIAGNOSIS — H35033 Hypertensive retinopathy, bilateral: Secondary | ICD-10-CM

## 2023-03-02 DIAGNOSIS — E113393 Type 2 diabetes mellitus with moderate nonproliferative diabetic retinopathy without macular edema, bilateral: Secondary | ICD-10-CM | POA: Diagnosis not present

## 2023-03-02 NOTE — Progress Notes (Signed)
[facility] - Clinic Note  03/02/2023     CHIEF COMPLAINT Patient presents for Retina Follow Up   HISTORY OF PRESENT ILLNESS: Logan [name] is a 72 y.o. male who presents to the clinic today for:   HPI     Retina Follow Up   Patient presents with  Diabetic Retinopathy.  In both eyes.  This started 6 months ago.  I, the attending physician,  performed the HPI with the patient and updated documentation appropriately.        Comments   Patient here for 6 months retina follow up for NPDR OU. Patient states vision no changes. No eye pain. Blood sugar 1 hour ago (11:20 am) 73.       Last edited by Rennis Chris, MD on 03/03/2023  1:02 AM.     Patient states he has not noticed any visual issues. Patient states that his vision varies with blood sugar levels.   Referring physician: Olivia Canter, MD [address] [place],  [place] [address]  HISTORICAL INFORMATION:   Selected notes from the MEDICAL RECORD NUMBER Referred by Dr. Fabian Sharp for concern of myopic degeneration LEE:  Ocular Hx- PMH-    CURRENT MEDICATIONS: No current outpatient medications on file. (Ophthalmic Drugs)   No current facility-administered medications for this visit. (Ophthalmic Drugs)   Current Outpatient Medications (Other)  Medication Sig   amLODipine (NORVASC) 10 MG tablet TAKE 1 TABLET(10 MG) BY MOUTH DAILY   aspirin 81 MG tablet Take 81 mg by mouth 2 (two) times daily.   Continuous Blood Gluc Receiver (FREESTYLE LIBRE 14 DAY READER) DEVI    Continuous Blood Gluc Sensor (FREESTYLE LIBRE 14 DAY SENSOR) MISC    Insulin Glargine (TOUJEO MAX SOLOSTAR [place]) Inject 19 Units into the skin at bedtime.   insulin NPH-regular Human (NOVOLIN 70/30) (70-30) 100 UNIT/ML injection Inject 20 Units into the skin 2 (two) times daily with a meal.   irbesartan (AVAPRO) 300 MG tablet TAKE 1 TABLET BY MOUTH EVERY DAY   levothyroxine (SYNTHROID) 88 MCG tablet Take 1 tablet (88 mcg  total) by mouth daily before breakfast. Follow-up appt due in must see provider for future refills   rosuvastatin (CRESTOR) 10 MG tablet Take 1 tablet (10 mg total) by mouth daily.   triamterene-hydrochlorothiazide (MAXZIDE-25) 37.5-25 MG tablet Take 1 tablet by mouth daily.   No current facility-administered medications for this visit. (Other)   REVIEW OF SYSTEMS: ROS   Positive for: Endocrine, Cardiovascular, Eyes Last edited by Laddie Aquas, COA on 03/02/2023 12:30 PM.     ALLERGIES No Known Allergies  PAST MEDICAL HISTORY Past Medical History:  Diagnosis Date   Aortic atherosclerosis (HCC)    Cataract    Coronary artery disease involving native coronary artery of native heart without angina pectoris 11/01/2007   Last Cath '04: LAD w/ 60% long lesion; 1st Dx 70% ostial; 2nd Dx  70% ostial; Cx 30% ostial; RCA-dominant multiple 30-40% lesions; PDA 30% lesions  Last nuclear stress March '09 - negative for ischemia    Diabetes mellitus    Diabetic retinopathy    Hearing loss    Hemochromatosis    Hyperlipidemia    Hypertension    Tubular adenoma of colon    Past Surgical History:  Procedure Laterality Date   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   Procedure: Right Great Toe Amputation;  Surgeon: Nadara Mustard, MD;  Location: [facility];  Service: Orthopedics;  Laterality: Right;  Right Great Toe Amputation   INNER EAR SURGERY     right   FAMILY HISTORY Family History  Problem Relation Age of Onset   Asthma Mother    Heart failure Mother    Emphysema Mother    Diabetes Father    Alcohol abuse Father    Cancer Father        prostate   Stroke Father    Diabetes Sister    Diabetes Brother    Colon cancer Maternal Uncle        dx'd 60   Blindness Paternal Grandmother    Diabetes Paternal Grandmother    Heart disease Neg Hx    Rectal cancer Neg Hx    Stomach cancer Neg Hx    SOCIAL HISTORY Social History   Tobacco Use   Smoking status: Never   Smokeless tobacco: Never   Vaping Use   Vaping status: Never Used  Substance Use Topics   Alcohol use: Yes    Comment: occ beer but very rare   Drug use: No       OPHTHALMIC EXAM:  Base Eye Exam     Visual Acuity (Snellen - Linear)       Right Left   Dist cc 20/40 +2 20/40 -2   Dist ph cc 20/30 -2 20/30 -1    Correction: Glasses         Tonometry (Tonopen, 12:27 PM)       Right Left   Pressure 19 17         Pupils       Dark Light Shape React APD   Right 3 2 Round Brisk None   Left 3 2 Round Brisk None         Visual Fields (Counting fingers)       Left Right    Full Full         Extraocular Movement       Right Left    Full, Ortho Full, Ortho         Neuro/Psych     Oriented x3: Yes   Mood/Affect: Normal         Dilation     Both eyes: 1.0% Mydriacyl, 2.5% Phenylephrine @ 12:27 PM           Slit Lamp and Fundus Exam     Slit Lamp Exam       Right Left   Lids/Lashes Dermatochalasis - upper lid, Meibomian gland dysfunction Dermatochalasis - upper lid, Meibomian gland dysfunction   Conjunctiva/Sclera White and quiet White and quiet   Cornea trace tear film debris, 1+ Guttata trace PEE, tear film debris, 1-2+ Guttata centrally   Anterior Chamber deep, clear, narrow temporal angle deep, clear, narrow temporal angle   Iris Round and dilated, No NVI Round and dilated, No NVI   Lens 2-3+ Nuclear sclerosis with brunescence, 2+ Cortical cataract, 1+ Posterior subcapsular cataract 3+ Nuclear sclerosis with brunescence, 2+ Cortical cataract, 1+ central posterior subcapsular cataract   Anterior Vitreous mild syneresis mild syneresis         Fundus Exam       Right Left   Disc mild Pallor, Sharp rim, temporal PPA, tilted mild Pallor, Sharp rim, tilted, temporal PPA   C/D Ratio 0.4 0.3   Macula Flat, Blunted foveal reflex, pigmented focal laser scars, rare MA, no edema Flat, Blunted foveal reflex, RPE mottling, heavy pigmented focal laser scars temporal macula,  no edema   Vessels attenuated, Tortuous, mild AV  crossing changes, no NVE attenuated, Tortuous, no NVE   Periphery Attached, rare punctate MA, paving stone degeneration inferiorly Attached, scattered punctate MA, paving stone degeneration inferiorly           Refraction     Wearing Rx       Sphere Cylinder Axis Add   Right -6.00 +1.25 153 +2.00   Left -6.25 +0.75 004 +2.00           IMAGING AND PROCEDURES  Imaging and Procedures for 03/02/2023  OCT, Retina - OU - Both Eyes       Right Eye Quality was good. Central Foveal Thickness: 254. Progression has been stable. Findings include no IRF, no SRF, abnormal foveal contour, myopic contour, retinal drusen , subretinal hyper-reflective material, pigment epithelial detachment, outer retinal atrophy, vitreomacular adhesion (Scattered focal laser changes (ORA, SRHM), no DME).   Left Eye Quality was good. Central Foveal Thickness: 262. Progression has been stable. Findings include no IRF, no SRF, abnormal foveal contour, myopic contour, retinal drusen , subretinal hyper-reflective material, outer retinal atrophy, vitreomacular adhesion (Scattered focal laser changes (ORA, SRHM), no DME).   Notes *Images captured and stored on drive  Diagnosis / Impression:  No IRF/SRF OU -- no DME OU Scattered focal laser changes (ORA, SRHM) OU  Clinical management:  See below  Abbreviations: NFP - Normal foveal profile. CME - cystoid macular edema. PED - pigment epithelial detachment. IRF - intraretinal fluid. SRF - subretinal fluid. EZ - ellipsoid zone. ERM - epiretinal membrane. ORA - outer retinal atrophy. ORT - outer retinal tubulation. SRHM - subretinal hyper-reflective material. IRHM - intraretinal hyper-reflective material            ASSESSMENT/PLAN:    ICD-10-CM   1. Moderate nonproliferative diabetic retinopathy of both eyes without macular edema associated with type 2 diabetes mellitus (HCC)  E11.3393 OCT, Retina - OU - Both  Eyes    2. Essential hypertension  I10     3. Hypertensive retinopathy of both eyes  H35.033     4. Uncomplicated degenerative myopia of both eyes  H44.23     5. Combined forms of age-related cataract of both eyes  H25.813      Moderate nonproliferative diabetic retinopathy w/o DME, both eyes - A1C 9.4 (02.07.24)  - hx of focal laser with Dr. Cecilie Kicks OU (OS>OD) (1984) - exam shows scattered punctate MA, heavy focal laser OU, no NV  - FA (09.29.23) shows OD: Low fluorescein signal, No NVE, scattered MA with mild leakage; OS: Low fluorescein signal, No NVE, scattered MA with mild leakage, staining and window defect of focal laser changes temporal macula - OCT without diabetic macular edema, both eyes  - no retinal or ophthalmic interventions indicated or recommended - f/u in 9 mos -- DFE/OCT  2,3. Hypertensive retinopathy OU - discussed importance of tight BP control - monitor  4. Degenerative myopia OU  - no complications - monitor  5. Mixed Cataract OU - The symptoms of cataract, surgical options, and treatments and risks were discussed with patient. - discussed diagnosis and progression - under the expert management of Dr. Fabian Sharp - clear from a retina standpoint to proceed with cataract surgery when pt and surgeon are ready   Ophthalmic Meds Ordered this visit:  No orders of the defined types were placed in this encounter.    Return in about 9 months (around 11/30/2023) for f/u NPDR OU, DFE, OCT.  There are no Patient Instructions on file for this visit.  Explained the diagnoses, plan, and follow up with the patient and they expressed understanding.  Patient expressed understanding of the importance of proper follow up care.   This document serves as a record of services personally performed by Karie Chimera, MD, PhD. It was created on their behalf by Glee Arvin. Manson Passey, OA an ophthalmic technician. The creation of this record is the provider's dictation and/or  activities during the visit.    Electronically signed by: Glee Arvin. Manson Passey, OA 03/03/23 1:06 AM  This document serves as a record of services personally performed by Karie Chimera, MD, PhD. It was created on their behalf by Charlette Caffey, COT an ophthalmic technician. The creation of this record is the provider's dictation and/or activities during the visit.    Electronically signed by:  Charlette Caffey, COT  03/03/23 1:06 AM  Karie Chimera, M.D., Ph.D. Diseases & Surgery of the Retina and Vitreous [facility]  I have reviewed the above documentation for accuracy and completeness, and I agree with the above. Karie Chimera, M.D., Ph.D. 03/03/23 1:06 AM   Abbreviations: M myopia (nearsighted); A astigmatism; H hyperopia (farsighted); P presbyopia; Mrx spectacle prescription;  CTL contact lenses; OD right eye; OS left eye; OU both eyes  XT exotropia; ET esotropia; PEK punctate epithelial keratitis; PEE punctate epithelial erosions; DES dry eye syndrome; MGD meibomian gland dysfunction; ATs artificial tears; PFAT's preservative free artificial tears; NSC nuclear sclerotic cataract; PSC posterior subcapsular cataract; ERM epi-retinal membrane; PVD posterior vitreous detachment; RD retinal detachment; DM diabetes mellitus; DR diabetic retinopathy; NPDR non-proliferative diabetic retinopathy; PDR proliferative diabetic retinopathy; CSME clinically significant macular edema; DME diabetic macular edema; dbh dot blot hemorrhages; CWS cotton wool spot; POAG primary open angle glaucoma; C/D cup-to-disc ratio; HVF humphrey visual field; GVF goldmann visual field; OCT optical coherence tomography; IOP intraocular pressure; BRVO Branch retinal vein occlusion; CRVO central retinal vein occlusion; CRAO central retinal artery occlusion; BRAO branch retinal artery occlusion; RT retinal tear; SB scleral buckle; PPV pars plana vitrectomy; VH Vitreous hemorrhage; PRP panretinal laser  photocoagulation; IVK intravitreal kenalog; VMT vitreomacular traction; MH Macular hole;  NVD neovascularization of the disc; NVE neovascularization elsewhere; AREDS age related eye disease study; ARMD age related macular degeneration; POAG primary open angle glaucoma; EBMD epithelial/anterior basement membrane dystrophy; ACIOL anterior chamber intraocular lens; IOL intraocular lens; PCIOL posterior chamber intraocular lens; Phaco/IOL phacoemulsification with intraocular lens placement; PRK photorefractive keratectomy; LASIK laser assisted in situ keratomileusis; HTN hypertension; DM diabetes mellitus; COPD chronic obstructive pulmonary disease

## 2023-03-03 ENCOUNTER — Encounter (INDEPENDENT_AMBULATORY_CARE_PROVIDER_SITE_OTHER): Payer: Self-pay | Admitting: Ophthalmology

## 2023-03-03 ENCOUNTER — Encounter (INDEPENDENT_AMBULATORY_CARE_PROVIDER_SITE_OTHER): Payer: Medicare Other | Admitting: Ophthalmology

## 2023-03-03 DIAGNOSIS — H35033 Hypertensive retinopathy, bilateral: Secondary | ICD-10-CM

## 2023-03-03 DIAGNOSIS — E113393 Type 2 diabetes mellitus with moderate nonproliferative diabetic retinopathy without macular edema, bilateral: Secondary | ICD-10-CM

## 2023-03-03 DIAGNOSIS — H4423 Degenerative myopia, bilateral: Secondary | ICD-10-CM

## 2023-03-03 DIAGNOSIS — I10 Essential (primary) hypertension: Secondary | ICD-10-CM

## 2023-03-03 DIAGNOSIS — H25813 Combined forms of age-related cataract, bilateral: Secondary | ICD-10-CM

## 2023-03-17 ENCOUNTER — Telehealth: Payer: Self-pay | Admitting: Internal Medicine

## 2023-03-17 ENCOUNTER — Other Ambulatory Visit: Payer: Self-pay | Admitting: Internal Medicine

## 2023-03-17 DIAGNOSIS — E109 Type 1 diabetes mellitus without complications: Secondary | ICD-10-CM

## 2023-03-17 MED ORDER — TOUJEO MAX SOLOSTAR 300 UNIT/ML ~~LOC~~ SOPN
20.0000 [IU] | PEN_INJECTOR | Freq: Every evening | SUBCUTANEOUS | 0 refills | Status: DC
Start: 2023-03-17 — End: 2023-07-05

## 2023-03-17 NOTE — Telephone Encounter (Signed)
Prescription Request  03/17/2023  LOV: 07/13/2022  What is the name of the medication or equipment? Insulin Glargine (TOUJEO MAX SOLOSTAR [place])   Have you contacted your pharmacy to request a refill? No   Which pharmacy would you like this sent to?   [id number] - [place], [place] - [address] AT [address] [place] [address] Phone: [phone] Fax: [phone]    Patient notified that their request is being sent to the clinical staff for review and that they should receive a response within 2 business days.   Please advise at [facility] [phone]

## 2023-03-20 ENCOUNTER — Inpatient Hospital Stay: Payer: Medicare Other | Attending: Hematology and Oncology

## 2023-03-20 LAB — CBC WITH DIFFERENTIAL (CANCER CENTER ONLY)
Abs Immature Granulocytes: 0.01 10*3/uL (ref 0.00–0.07)
Basophils Absolute: 0.1 10*3/uL (ref 0.0–0.1)
Basophils Relative: 2 %
Eosinophils Absolute: 0.6 10*3/uL — ABNORMAL HIGH (ref 0.0–0.5)
Eosinophils Relative: 9 %
HCT: 44.5 % (ref 39.0–52.0)
Hemoglobin: 15.3 g/dL (ref 13.0–17.0)
Immature Granulocytes: 0 %
Lymphocytes Relative: 31 %
Lymphs Abs: 1.9 10*3/uL (ref 0.7–4.0)
MCH: 31.8 pg (ref 26.0–34.0)
MCHC: 34.4 g/dL (ref 30.0–36.0)
MCV: 92.5 fL (ref 80.0–100.0)
Monocytes Absolute: 0.8 10*3/uL (ref 0.1–1.0)
Monocytes Relative: 13 %
Neutro Abs: 2.8 10*3/uL (ref 1.7–7.7)
Neutrophils Relative %: 45 %
Platelet Count: 205 10*3/uL (ref 150–400)
RBC: 4.81 MIL/uL (ref 4.22–5.81)
RDW: 13.3 % (ref 11.5–15.5)
WBC Count: 6.1 10*3/uL (ref 4.0–10.5)
nRBC: 0 % (ref 0.0–0.2)

## 2023-03-20 LAB — FERRITIN: Ferritin: 94 ng/mL (ref 24–336)

## 2023-03-24 LAB — HM DIABETES EYE EXAM

## 2023-03-25 IMAGING — CT CT ABD-PELV W/O CM
2 of 4 series · 12 of 46 positions shown, 14 images · non-contrast
Comparison: 04/23/2009

CLINICAL DATA: Left upper quadrant abdominal pain. Rule out splenic
infarct.

EXAM:
CT ABDOMEN AND PELVIS WITHOUT CONTRAST
TECHNIQUE: Multidetector CT imaging of the abdomen and pelvis was performed
following the standard protocol without IV contrast.

[Series 2: routine abdomen pelvis without 5.00 br40 s3 axial · axial · non-contrast · 0.52mm/px · z∈[+1238,+1648]mm · 9 of 100 slices shown, 11 images]
[im 9/100  soft-tissue]
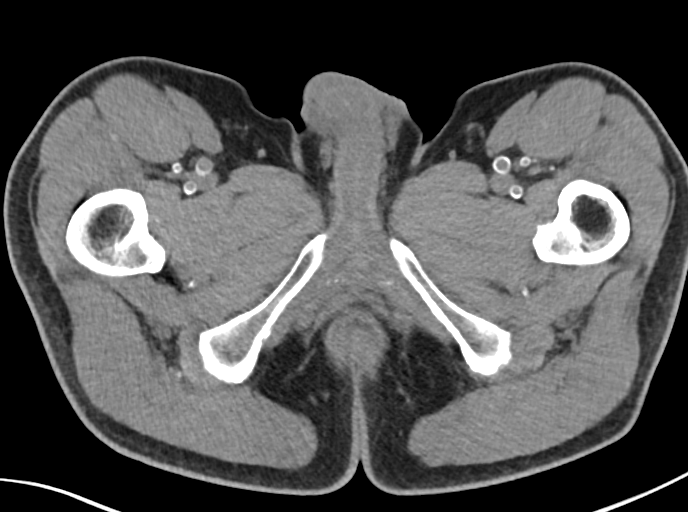
[im 9/100  bone]
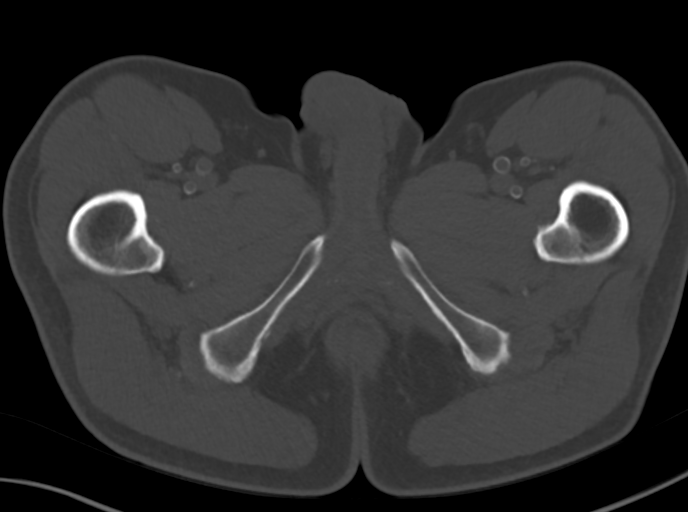
[im 18/100  soft-tissue]
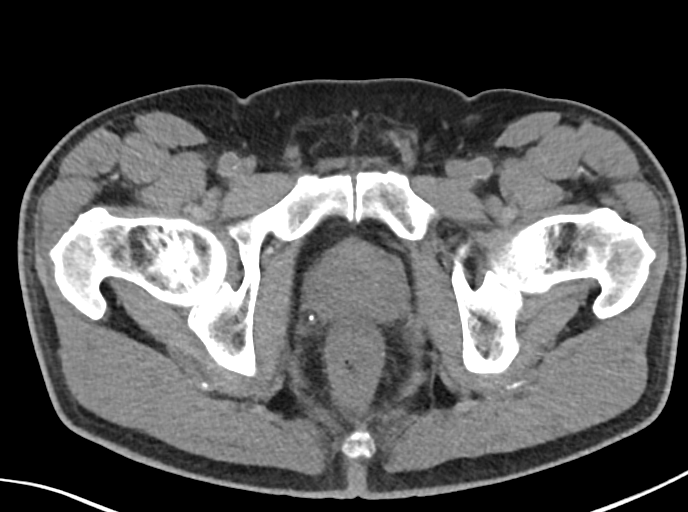
[im 31/100  soft-tissue]
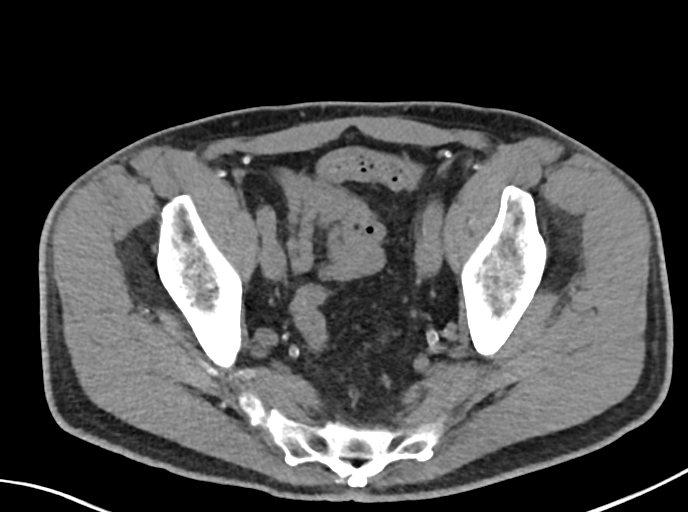
[im 39/100  soft-tissue]
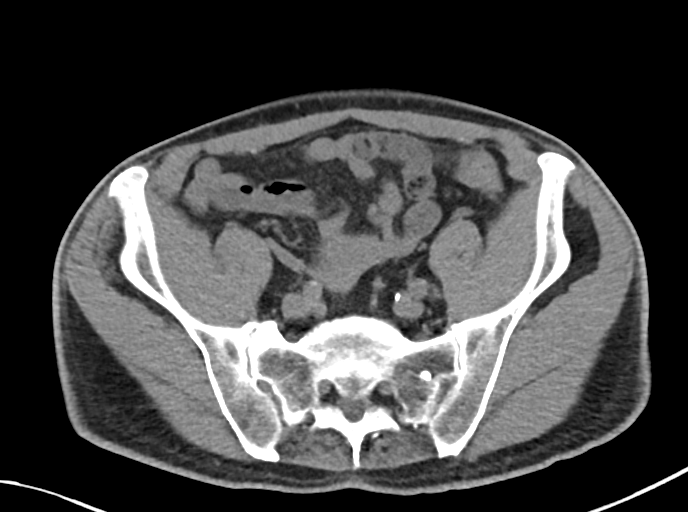
[im 52/100  soft-tissue]
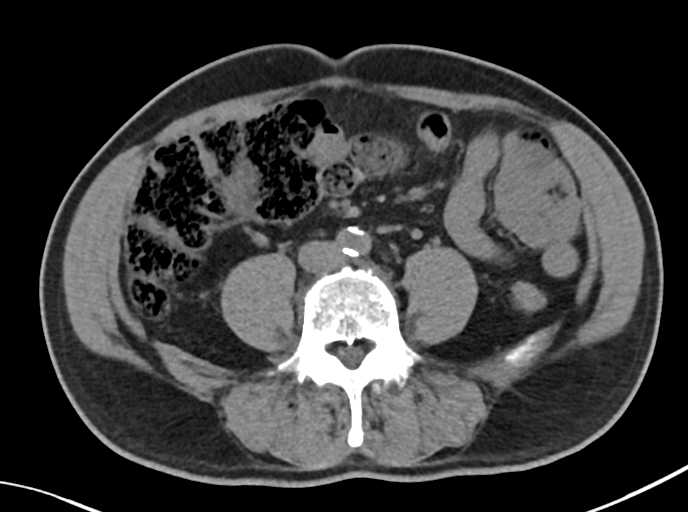
[im 61/100  soft-tissue]
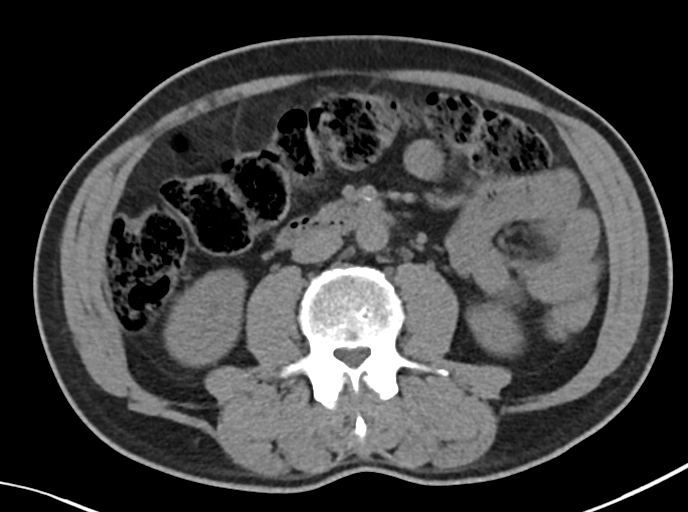
[im 69/100  soft-tissue]
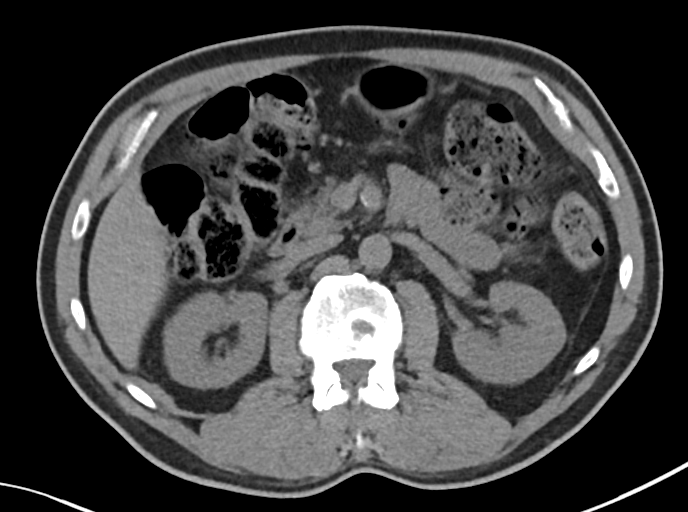
[im 82/100  soft-tissue]
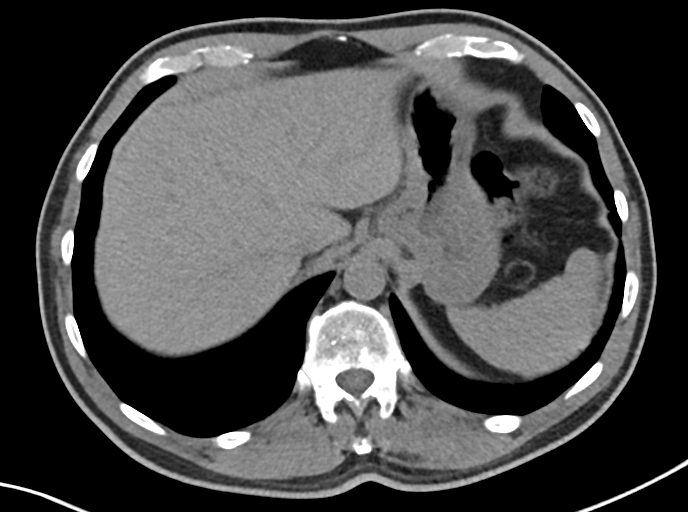
[im 91/100  soft-tissue]
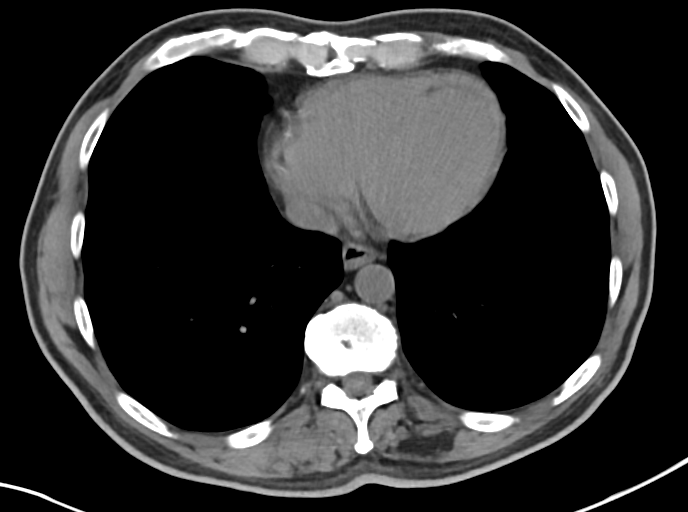
[im 91/100  bone]
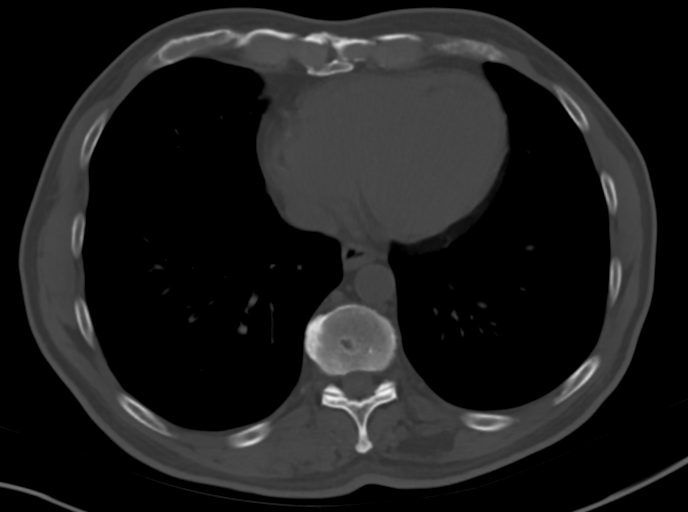

[Series 4: routine abdomen pelvis without 2.00 br40 s3 cor · coronal · non-contrast · 0.69mm/px · 3 of 132 slices shown]
[im 44/132  soft-tissue]
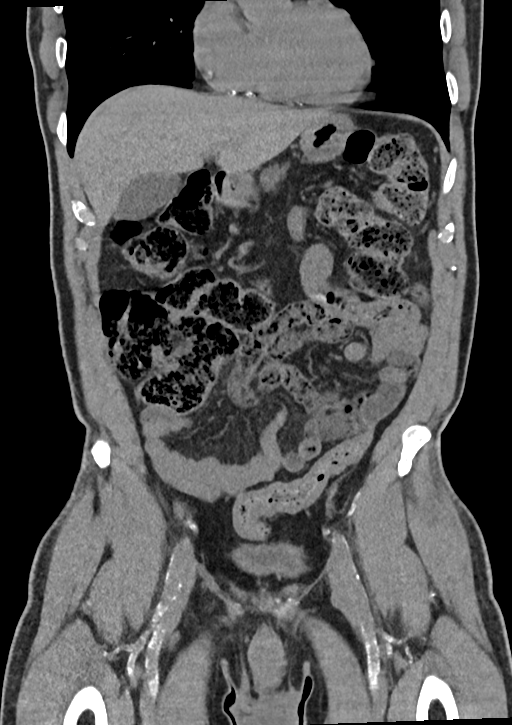
[im 59/132  soft-tissue]
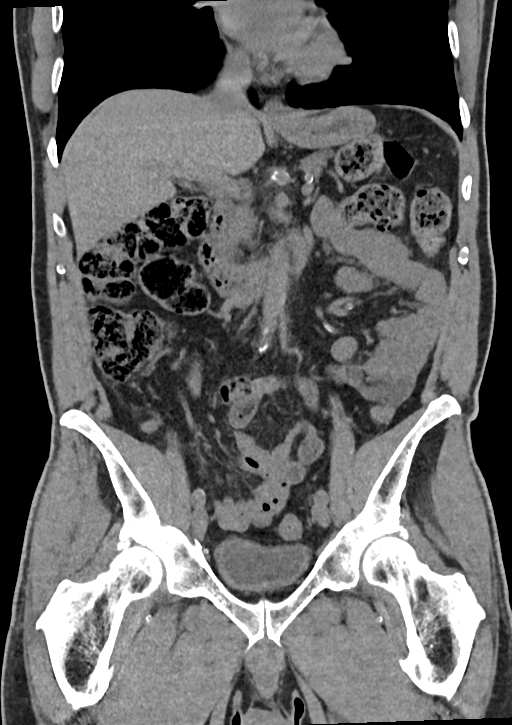
[im 73/132  soft-tissue]
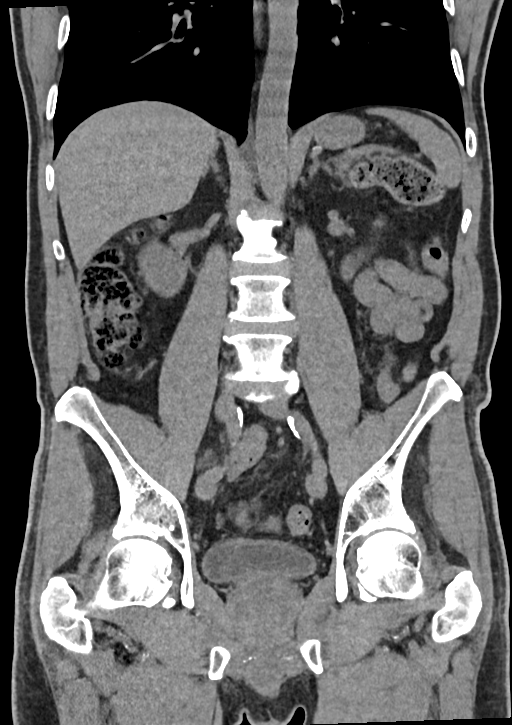

[12 of 46 positions shown; findings below may reference images not displayed]

FINDINGS: Lower chest: Coronary artery calcification is evident.

Hepatobiliary: No focal abnormality in the liver on this study
without intravenous contrast. There is no evidence for gallstones,
gallbladder wall thickening, or pericholecystic fluid. No
intrahepatic or extrahepatic biliary dilation.

Pancreas: No focal mass lesion. No dilatation of the main duct. No
intraparenchymal cyst. No peripancreatic edema.

Spleen: No splenomegaly. No focal mass lesion.

Adrenals/Urinary Tract: No adrenal nodule or mass. Kidneys
unremarkable. No evidence for hydroureter. The urinary bladder
appears normal for the degree of distention.

Stomach/Bowel: Stomach is unremarkable. No gastric wall thickening.
No evidence of outlet obstruction. Duodenum is normally positioned
as is the ligament of Treitz. No small bowel wall thickening. No
small bowel dilatation. The terminal ileum is normal. The appendix
is normal. No gross colonic mass. No colonic wall thickening. Large
stool volume noted right and transverse colon. Left colon
decompressed.

Vascular/Lymphatic: There is abdominal aortic atherosclerosis
without aneurysm. There is no gastrohepatic or hepatoduodenal
ligament lymphadenopathy. No retroperitoneal or mesenteric
lymphadenopathy. No pelvic sidewall lymphadenopathy.

Reproductive: The prostate gland and seminal vesicles are
unremarkable.

Other: No intraperitoneal free fluid.

Musculoskeletal: No worrisome lytic or sclerotic osseous
abnormality.
IMPRESSION: 1. No acute findings in the abdomen or pelvis. Specifically, no
findings to explain the patient's history of left upper quadrant
pain. Evaluation for splenic infarct markedly limited due to lack of
intravenous contrast material.
2. Aortic Atherosclerosis ([id number]).

## 2023-03-28 ENCOUNTER — Other Ambulatory Visit: Payer: Self-pay | Admitting: Internal Medicine

## 2023-03-28 DIAGNOSIS — I10 Essential (primary) hypertension: Secondary | ICD-10-CM

## 2023-04-04 DIAGNOSIS — Z23 Encounter for immunization: Secondary | ICD-10-CM | POA: Diagnosis not present

## 2023-04-05 ENCOUNTER — Other Ambulatory Visit: Payer: Self-pay | Admitting: Internal Medicine

## 2023-04-05 DIAGNOSIS — E039 Hypothyroidism, unspecified: Secondary | ICD-10-CM

## 2023-04-25 ENCOUNTER — Encounter: Payer: Self-pay | Admitting: Internal Medicine

## 2023-04-25 ENCOUNTER — Ambulatory Visit: Payer: Medicare Other | Admitting: Internal Medicine

## 2023-04-25 VITALS — BP 134/68 | HR 67 | Temp 97.8°F | Resp 16 | Ht 74.0 in | Wt 177.4 lb

## 2023-04-25 DIAGNOSIS — Z794 Long term (current) use of insulin: Secondary | ICD-10-CM | POA: Diagnosis not present

## 2023-04-25 DIAGNOSIS — I10 Essential (primary) hypertension: Secondary | ICD-10-CM

## 2023-04-25 DIAGNOSIS — H903 Sensorineural hearing loss, bilateral: Secondary | ICD-10-CM | POA: Diagnosis not present

## 2023-04-25 DIAGNOSIS — E1051 Type 1 diabetes mellitus with diabetic peripheral angiopathy without gangrene: Secondary | ICD-10-CM

## 2023-04-25 DIAGNOSIS — E11319 Type 2 diabetes mellitus with unspecified diabetic retinopathy without macular edema: Secondary | ICD-10-CM | POA: Diagnosis not present

## 2023-04-25 DIAGNOSIS — I70209 Unspecified atherosclerosis of native arteries of extremities, unspecified extremity: Secondary | ICD-10-CM

## 2023-04-25 DIAGNOSIS — E785 Hyperlipidemia, unspecified: Secondary | ICD-10-CM | POA: Diagnosis not present

## 2023-04-25 DIAGNOSIS — N400 Enlarged prostate without lower urinary tract symptoms: Secondary | ICD-10-CM | POA: Diagnosis not present

## 2023-04-25 DIAGNOSIS — K635 Polyp of colon: Secondary | ICD-10-CM

## 2023-04-25 LAB — MICROALBUMIN / CREATININE URINE RATIO
Creatinine,U: 45.9 mg/dL
Microalb Creat Ratio: 5.9 mg/g (ref 0.0–30.0)
Microalb, Ur: 2.7 mg/dL — ABNORMAL HIGH (ref 0.0–1.9)

## 2023-04-25 LAB — BASIC METABOLIC PANEL
BUN: 17 mg/dL (ref 6–23)
CO2: 28 meq/L (ref 19–32)
Calcium: 9.3 mg/dL (ref 8.4–10.5)
Chloride: 103 meq/L (ref 96–112)
Creatinine, Ser: 1.1 mg/dL (ref 0.40–1.50)
GFR: 67.2 mL/min (ref >60.00)
Glucose, Bld: 74 mg/dL (ref 70–99)
Potassium: 4 meq/L (ref 3.5–5.1)
Sodium: 140 meq/L (ref 135–145)

## 2023-04-25 LAB — LIPID PANEL
Cholesterol: 106 mg/dL (ref 0–200)
HDL: 44.4 mg/dL (ref >39.00)
LDL Cholesterol: 50 mg/dL (ref 0–99)
NonHDL: 61.32
Total CHOL/HDL Ratio: 2
Triglycerides: 56 mg/dL (ref 0.0–149.0)
VLDL: 11.2 mg/dL (ref 0.0–40.0)

## 2023-04-25 LAB — URINALYSIS, ROUTINE W REFLEX MICROSCOPIC
Bilirubin Urine: NEGATIVE
Hgb urine dipstick: NEGATIVE
Ketones, ur: NEGATIVE
Leukocytes,Ua: NEGATIVE
Nitrite: NEGATIVE
RBC / HPF: NONE SEEN (ref 0–?)
Specific Gravity, Urine: 1.01 (ref 1.000–1.030)
Total Protein, Urine: NEGATIVE
Urine Glucose: NEGATIVE
Urobilinogen, UA: 0.2 (ref 0.0–1.0)
WBC, UA: NONE SEEN (ref 0–?)
pH: 7 (ref 5.0–8.0)

## 2023-04-25 LAB — HEPATIC FUNCTION PANEL
ALT: 37 U/L (ref 0–53)
AST: 42 U/L — ABNORMAL HIGH (ref 0–37)
Albumin: 4.3 g/dL (ref 3.5–5.2)
Alkaline Phosphatase: 130 U/L — ABNORMAL HIGH (ref 39–117)
Bilirubin, Direct: 0.2 mg/dL (ref 0.0–0.3)
Total Bilirubin: 1 mg/dL (ref 0.2–1.2)
Total Protein: 6.7 g/dL (ref 6.0–8.3)

## 2023-04-25 LAB — PSA: PSA: 3.54 ng/mL (ref 0.10–4.00)

## 2023-04-25 NOTE — Progress Notes (Unsigned)
Subjective:  Patient ID: Logan [name], male    DOB: 06/10/1950  Age: 72 y.o. MRN: [id number]  CC: Hypothyroidism and Hypertension   HPI Logan [name] presents for f/up ----  Discussed the use of AI scribe software for clinical note transcription with the patient, who gave verbal consent to proceed.  History of Present Illness   The patient, with a history of thyroid disease, presented for a routine visit to renew his thyroid medication. He reported a recent onset of increased sneezing since the start of the fall season. Approximately two weeks prior to the visit, he developed bilateral ear infections, which were more severe on the right side. Earlier in the year, around February, he experienced a similar episode. During that time, he was seen by another provider who prescribed ear drops for itching and a course of antibiotics. However, the patient reported no improvement with this treatment. He denied any associated symptoms such as fever, chills, night sweats, sore throat, cough, or difficulty swallowing. The patient also reported a significant decrease in hearing due to the ear infection, which led him to seek care at a hearing center. However, he was unable to secure an appointment until late April. By the time of the appointment, the infection had resolved on its own.       Outpatient Medications Prior to Visit  Medication Sig Dispense Refill   amLODipine (NORVASC) 10 MG tablet TAKE 1 TABLET(10 MG) BY MOUTH DAILY 90 tablet 0   aspirin 81 MG tablet Take 81 mg by mouth 2 (two) times daily.     Continuous Blood Gluc Receiver (FREESTYLE LIBRE 14 DAY READER) DEVI      Continuous Blood Gluc Sensor (FREESTYLE LIBRE 14 DAY SENSOR) MISC      insulin glargine, 2 Unit Dial, (TOUJEO MAX SOLOSTAR) 300 UNIT/ML Solostar Pen Inject 20 Units into the skin at bedtime. (Patient taking differently: Inject 19 Units into the skin at bedtime.) 6 mL 0   insulin NPH-regular Human (NOVOLIN 70/30) (70-30) 100  UNIT/ML injection Inject 20 Units into the skin 2 (two) times daily with a meal. 48 mL 0   irbesartan (AVAPRO) 300 MG tablet TAKE 1 TABLET BY MOUTH EVERY DAY 90 tablet 0   levothyroxine (SYNTHROID) 88 MCG tablet TAKE 1 TABLET(88 MCG) BY MOUTH DAILY 30 tablet 0   rosuvastatin (CRESTOR) 10 MG tablet Take 1 tablet (10 mg total) by mouth daily. 90 tablet 3   triamterene-hydrochlorothiazide (MAXZIDE-25) 37.5-25 MG tablet Take 1 tablet by mouth daily. 90 tablet 3   No facility-administered medications prior to visit.    ROS Review of Systems  Constitutional:  Negative for chills, diaphoresis, fatigue and fever.  HENT:  Positive for hearing loss. Negative for ear discharge, ear pain, rhinorrhea, sinus pressure, sinus pain, sore throat and tinnitus.   Eyes:  Negative for visual disturbance.  Respiratory:  Negative for cough, chest tightness, shortness of breath and wheezing.   Cardiovascular:  Positive for leg swelling. Negative for chest pain and palpitations.  Gastrointestinal:  Negative for abdominal pain, constipation, diarrhea, nausea and vomiting.  Genitourinary: Negative.  Negative for difficulty urinating.  Musculoskeletal: Negative.  Negative for arthralgias.  Skin: Negative.  Negative for color change.  Neurological: Negative.   Hematological:  Negative for adenopathy. Does not bruise/bleed easily.  Psychiatric/Behavioral: Negative.      Objective:  BP 134/68 (BP Location: Left Arm, Patient Position: Sitting, Cuff Size: Normal)   Pulse 67   Temp 97.8 F (36.6 C) (Oral)  Resp 16   Ht 6\' 2"  (1.88 m)   Wt 177 lb 6.4 oz (80.5 kg)   SpO2 97%   BMI 22.78 kg/m   BP Readings from Last 3 Encounters:  04/25/23 134/68  01/25/23 134/72  12/28/22 (!) 164/62    Wt Readings from Last 3 Encounters:  04/25/23 177 lb 6.4 oz (80.5 kg)  12/23/22 177 lb 1.6 oz (80.3 kg)  10/17/22 179 lb (81.2 kg)    Physical Exam Vitals reviewed.  Constitutional:      Appearance: Normal appearance.   HENT:     Right Ear: Decreased hearing noted. There is impacted cerumen.     Left Ear: Tympanic membrane, ear canal and external ear normal. Decreased hearing noted. There is no impacted cerumen.  Eyes:     General: No scleral icterus.    Conjunctiva/sclera: Conjunctivae normal.  Cardiovascular:     Rate and Rhythm: Normal rate and regular rhythm.     Pulses: Normal pulses.     Heart sounds: No murmur heard.    No gallop.  Pulmonary:     Effort: Pulmonary effort is normal.     Breath sounds: No stridor. No wheezing, rhonchi or rales.  Abdominal:     General: Abdomen is flat.     Palpations: There is no mass.     Tenderness: There is no abdominal tenderness. There is no guarding.     Hernia: No hernia is present.  Musculoskeletal:        General: No swelling.     Cervical back: Neck supple.     Right lower leg: 1+ Pitting Edema present.     Left lower leg: 1+ Pitting Edema present.  Lymphadenopathy:     Cervical: No cervical adenopathy.  Skin:    General: Skin is warm and dry.     Coloration: Skin is not pale.  Neurological:     General: No focal deficit present.     Mental Status: He is alert. Mental status is at baseline.  Psychiatric:        Mood and Affect: Mood normal.        Behavior: Behavior normal.     Lab Results  Component Value Date   WBC 6.1 03/20/2023   HGB 15.3 03/20/2023   HCT 44.5 03/20/2023   PLT 205 03/20/2023   GLUCOSE 74 04/25/2023   CHOL 106 04/25/2023   TRIG 56.0 04/25/2023   HDL 44.40 04/25/2023   LDLCALC 50 04/25/2023   ALT 37 04/25/2023   AST 42 (H) 04/25/2023   NA 140 04/25/2023   K 4.0 04/25/2023   CL 103 04/25/2023   CREATININE 1.10 04/25/2023   BUN 17 04/25/2023   CO2 28 04/25/2023   TSH 4.70 07/13/2022   PSA 3.54 04/25/2023   HGBA1C 8.00 12/12/2022   MICROALBUR 2.7 (H) 04/25/2023    DG Chest Port 1 View  Result Date: 08/31/2021 CLINICAL DATA:  Nausea EXAM: PORTABLE CHEST 1 VIEW COMPARISON:  None. FINDINGS: Cardiac and  mediastinal contours are within normal limits. No focal pulmonary opacity. No pleural effusion or pneumothorax. No acute osseous abnormality. IMPRESSION: No acute cardiopulmonary process. Electronically Signed   By: Wiliam Ke M.D.   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 -     Basic  metabolic panel; Future -     HM Diabetes Foot Exam -     Urinalysis, Routine w reflex microscopic; Future -     Microalbumin / creatinine urine ratio; Future  Polyp of colon, unspecified part of colon, unspecified type -     Ambulatory referral to Gastroenterology  Benign prostatic hyperplasia without lower urinary tract symptoms -     PSA; Future  Essential hypertension - BP is well controlled. -     Basic metabolic panel; Future -     Urinalysis, Routine w reflex microscopic; Future  Sensorineural hearing loss (SNHL) of both ears -     Ambulatory referral to ENT  Hyperlipidemia LDL goal <70 - LDL goal achieved. Doing well on the statin  -     Lipid panel; Future -     Hepatic function panel; Future     Follow-up: Return in about 3 months (around 07/26/2023).  Sanda Linger, MD

## 2023-04-25 NOTE — Patient Instructions (Signed)
Hypertension, Adult High blood pressure (hypertension) is when the force of blood pumping through the arteries is too strong. The arteries are the blood vessels that carry blood from the heart throughout the body. Hypertension forces the heart to work harder to pump blood and may cause arteries to become narrow or stiff. Untreated or uncontrolled hypertension can lead to a heart attack, heart failure, a stroke, kidney disease, and other problems. A blood pressure reading consists of a higher number over a lower number. Ideally, your blood pressure should be below 120/80. The first ("top") number is called the systolic pressure. It is a measure of the pressure in your arteries as your heart beats. The second ("bottom") number is called the diastolic pressure. It is a measure of the pressure in your arteries as the heart relaxes. What are the causes? The exact cause of this condition is not known. There are some conditions that result in high blood pressure. What increases the risk? Certain factors may make you more likely to develop high blood pressure. Some of these risk factors are under your control, including: Smoking. Not getting enough exercise or physical activity. Being overweight. Having too much fat, sugar, calories, or salt (sodium) in your diet. Drinking too much alcohol. Other risk factors include: Having a personal history of heart disease, diabetes, high cholesterol, or kidney disease. Stress. Having a family history of high blood pressure and high cholesterol. Having obstructive sleep apnea. Age. The risk increases with age. What are the signs or symptoms? High blood pressure may not cause symptoms. Very high blood pressure (hypertensive crisis) may cause: Headache. Fast or irregular heartbeats (palpitations). Shortness of breath. Nosebleed. Nausea and vomiting. Vision changes. Severe chest pain, dizziness, and seizures. How is this diagnosed? This condition is diagnosed by  measuring your blood pressure while you are seated, with your arm resting on a flat surface, your legs uncrossed, and your feet flat on the floor. The cuff of the blood pressure monitor will be placed directly against the skin of your upper arm at the level of your heart. Blood pressure should be measured at least twice using the same arm. Certain conditions can cause a difference in blood pressure between your right and left arms. If you have a high blood pressure reading during one visit or you have normal blood pressure with other risk factors, you may be asked to: Return on a different day to have your blood pressure checked again. Monitor your blood pressure at home for 1 week or longer. If you are diagnosed with hypertension, you may have other blood or imaging tests to help your health care provider understand your overall risk for other conditions. How is this treated? This condition is treated by making healthy lifestyle changes, such as eating healthy foods, exercising more, and reducing your alcohol intake. You may be referred for counseling on a healthy diet and physical activity. Your health care provider may prescribe medicine if lifestyle changes are not enough to get your blood pressure under control and if: Your systolic blood pressure is above 130. Your diastolic blood pressure is above 80. Your personal target blood pressure may vary depending on your medical conditions, your age, and other factors. Follow these instructions at home: Eating and drinking  Eat a diet that is high in fiber and potassium, and low in sodium, added sugar, and fat. An example of this eating plan is called the DASH diet. DASH stands for Dietary Approaches to Stop Hypertension. To eat this way: Eat   plenty of fresh fruits and vegetables. Try to fill one half of your plate at each meal with fruits and vegetables. Eat whole grains, such as whole-wheat pasta, brown rice, or whole-grain bread. Fill about one  fourth of your plate with whole grains. Eat or drink low-fat dairy products, such as skim milk or low-fat yogurt. Avoid fatty cuts of meat, processed or cured meats, and poultry with skin. Fill about one fourth of your plate with lean proteins, such as fish, chicken without skin, beans, eggs, or tofu. Avoid pre-made and processed foods. These tend to be higher in sodium, added sugar, and fat. Reduce your daily sodium intake. Many people with hypertension should eat less than 1,500 mg of sodium a day. Do not drink alcohol if: Your health care provider tells you not to drink. You are pregnant, may be pregnant, or are planning to become pregnant. If you drink alcohol: Limit how much you have to: 0-1 drink a day for women. 0-2 drinks a day for men. Know how much alcohol is in your drink. In the [place] one drink equals one 12 oz bottle of beer (355 mL), one 5 oz glass of wine (148 mL), or one 1 oz glass of hard liquor (44 mL). Lifestyle  Work with your health care provider to maintain a healthy body weight or to lose weight. Ask what an ideal weight is for you. Get at least 30 minutes of exercise that causes your heart to beat faster (aerobic exercise) most days of the week. Activities may include walking, swimming, or biking. Include exercise to strengthen your muscles (resistance exercise), such as Pilates or lifting weights, as part of your weekly exercise routine. Try to do these types of exercises for 30 minutes at least 3 days a week. Do not use any products that contain nicotine or tobacco. These products include cigarettes, chewing tobacco, and vaping devices, such as e-cigarettes. If you need help quitting, ask your health care provider. Monitor your blood pressure at home as told by your health care provider. Keep all follow-up visits. This is important. Medicines Take over-the-counter and prescription medicines only as told by your health care provider. Follow directions carefully. Blood  pressure medicines must be taken as prescribed. Do not skip doses of blood pressure medicine. Doing this puts you at risk for problems and can make the medicine less effective. Ask your health care provider about side effects or reactions to medicines that you should watch for. Contact a health care provider if you: Think you are having a reaction to a medicine you are taking. Have headaches that keep coming back (recurring). Feel dizzy. Have swelling in your ankles. Have trouble with your vision. Get help right away if you: Develop a severe headache or confusion. Have unusual weakness or numbness. Feel faint. Have severe pain in your chest or abdomen. Vomit repeatedly. Have trouble breathing. These symptoms may be an emergency. Get help right away. Call 911. Do not wait to see if the symptoms will go away. Do not drive yourself to the hospital. Summary Hypertension is when the force of blood pumping through your arteries is too strong. If this condition is not controlled, it may put you at risk for serious complications. Your personal target blood pressure may vary depending on your medical conditions, your age, and other factors. For most people, a normal blood pressure is less than 120/80. Hypertension is treated with lifestyle changes, medicines, or a combination of both. Lifestyle changes include losing weight, eating a healthy,   low-sodium diet, exercising more, and limiting alcohol. This information is not intended to replace advice given to you by your health care provider. Make sure you discuss any questions you have with your health care provider. Document Revised: 03/30/2021 Document Reviewed: 03/30/2021 [employer] Patient Education  2024 [employer]  

## 2023-05-03 ENCOUNTER — Other Ambulatory Visit: Payer: Self-pay | Admitting: Internal Medicine

## 2023-05-03 DIAGNOSIS — E039 Hypothyroidism, unspecified: Secondary | ICD-10-CM

## 2023-05-11 ENCOUNTER — Encounter (INDEPENDENT_AMBULATORY_CARE_PROVIDER_SITE_OTHER): Payer: Self-pay | Admitting: Otolaryngology

## 2023-05-15 DIAGNOSIS — H4423 Degenerative myopia, bilateral: Secondary | ICD-10-CM | POA: Diagnosis not present

## 2023-05-15 DIAGNOSIS — E109 Type 1 diabetes mellitus without complications: Secondary | ICD-10-CM | POA: Diagnosis not present

## 2023-05-15 DIAGNOSIS — H25813 Combined forms of age-related cataract, bilateral: Secondary | ICD-10-CM | POA: Diagnosis not present

## 2023-05-24 DIAGNOSIS — H6123 Impacted cerumen, bilateral: Secondary | ICD-10-CM | POA: Diagnosis not present

## 2023-06-20 DIAGNOSIS — E109 Type 1 diabetes mellitus without complications: Secondary | ICD-10-CM | POA: Diagnosis not present

## 2023-06-25 ENCOUNTER — Other Ambulatory Visit: Payer: Self-pay | Admitting: Hematology and Oncology

## 2023-06-25 NOTE — Progress Notes (Unsigned)
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 Genetic testing shows patient is homozygous for C282Y 01/13/2021: establish care with Dr. Leonides Schanz  01/19/2021: Ferritin 129, Hgb 14.6 02/02/2021: Ferritin 78, Hgb 13.5 02/16/2021: Ferritin 53, Hgb 13.6 03/03/2022: last phlebotomy. Subsequent HELD as patient was at target.  06/22/2021: ferritin 36, Hgb 16.3 09/20/2021: ferritin 57, Hgb 15.5  Interval History:  Logan [name] 73 y.o. male with medical history significant for hereditary hemachromatosis presents for a follow up visit. The patient's last visit was on 12/23/2022. In the interim since the last visit he has not required any further phlebotomy.  On exam today Logan [name] reports he has been well overall with interim since her last visit.  He notes he is prone to ear infections and had 1 recently.  He reports that he does get antibiotic therapies which help "knock it out".  He reports otherwise he has been doing good.  His energy levels are strong.  His blood pressure is elevated today but he denies any chest pain, vision changes, or shortness of breath.  He notes he is not eating much in the way of red meat anymore and tries eat more chicken and fish, though he did eat deer recently.  Overall he is at his baseline level of health with no questions concerns or complaints today.  He currently denies any fevers, chills, sweats, nausea, vomiting or diarrhea.    A full 10 point ROS is listed below.  MEDICAL HISTORY:  Past Medical History:  Diagnosis Date   Aortic  atherosclerosis (HCC)    Cataract    Coronary artery disease involving native coronary artery of native heart without angina pectoris 11/01/2007   Last Cath '04: LAD w/ 60% long lesion; 1st Dx 70% ostial; 2nd Dx  70% ostial; Cx 30% ostial; RCA-dominant multiple 30-40% lesions; PDA 30% lesions  Last nuclear stress March '09 - negative for ischemia    Diabetes mellitus    Diabetic retinopathy    Hearing loss    Hemochromatosis    Hyperlipidemia    Hypertension    Tubular adenoma of colon     SURGICAL HISTORY: Past Surgical History:  Procedure Laterality Date   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   Procedure: Right Great Toe Amputation;  Surgeon: Nadara Mustard, MD;  Location: [facility];  Service: Orthopedics;  Laterality: Right;  Right Great Toe Amputation   INNER EAR SURGERY     right    SOCIAL HISTORY: Social History   Socioeconomic History   Marital status: Widowed    Spouse name: Not on file   Number of children: 1   Years of education: 70   Highest education level: Not on file  Occupational History   Occupation: retired    Associate Professor: [employer]  Tobacco Use   Smoking status: Never   Smokeless tobacco: Never  Vaping Use   Vaping status: Never Used  Substance and Sexual Activity   Alcohol use: Yes    Comment: occ beer but very rare   Drug use: No   Sexual activity: Yes    Partners: Female  Other Topics Concern   Not on file  Social History Narrative   HSG. Married - '77 - '04. 1 son '80. Work - semi-retired. Has a girlfriend.   Social Drivers of Corporate investment banker Strain: Low Risk  (03/19/2020)   Overall Financial Resource Strain (CARDIA)    Difficulty of Paying Living Expenses: Not hard at all  Food Insecurity: No Food Insecurity (11/25/2021)   Hunger Vital Sign    Worried About Running Out of Food in the Last Year: Never true    Ran Out of Food in the Last Year: Never true  Transportation Needs: No Transportation Needs (11/25/2021)   PRAPARE -  Administrator, Civil Service (Medical): No    Lack of Transportation (Non-Medical): No  Physical Activity: Sufficiently Active (03/19/2020)   Exercise Vital Sign    Days of Exercise per Week: 3 days    Minutes of Exercise per Session: 60 min  Stress: Not on file  Social Connections: Not on file  Intimate Partner Violence: Not At Risk (03/19/2020)   Humiliation, Afraid, Rape, and Kick questionnaire    Fear of Current or Ex-Partner: No    Emotionally Abused: No    Physically Abused: No    Sexually Abused: No    FAMILY HISTORY: Family History  Problem Relation Age of Onset   Asthma Mother    Heart failure Mother    Emphysema Mother    Diabetes Father    Alcohol abuse Father    Cancer Father        prostate   Stroke Father    Diabetes Sister    Diabetes Brother    Colon cancer Maternal Uncle        dx'd 27   Blindness Paternal Grandmother    Diabetes Paternal Grandmother    Heart disease Neg Hx    Rectal cancer Neg Hx    Stomach cancer Neg Hx     ALLERGIES:  has no known allergies.  MEDICATIONS:  Current Outpatient Medications  Medication Sig Dispense Refill   amLODipine (NORVASC) 10 MG tablet TAKE 1 TABLET(10 MG) BY MOUTH DAILY 90 tablet 0   aspirin 81 MG tablet Take 81 mg by mouth 2 (two) times daily.     Continuous Blood Gluc Receiver (FREESTYLE LIBRE 14 DAY READER) DEVI      Continuous Blood Gluc Sensor (FREESTYLE LIBRE 14 DAY SENSOR) MISC      insulin glargine, 2 Unit Dial, (TOUJEO MAX SOLOSTAR) 300 UNIT/ML Solostar Pen Inject 20 Units into the skin at bedtime. (Patient taking differently: Inject 19 Units into the skin at bedtime.) 6 mL 0   insulin NPH-regular Human (NOVOLIN 70/30) (70-30) 100 UNIT/ML injection Inject 20 Units into the skin 2 (two) times daily with a meal. 48 mL 0   irbesartan (AVAPRO) 300 MG tablet TAKE 1 TABLET BY MOUTH EVERY DAY 90 tablet 0   levothyroxine (SYNTHROID) 88 MCG tablet TAKE 1 TABLET(88 MCG) BY MOUTH DAILY 90 tablet 0    rosuvastatin (CRESTOR) 10 MG tablet Take 1 tablet (10 mg total) by mouth daily. 90 tablet 3   triamterene-hydrochlorothiazide (MAXZIDE-25) 37.5-25 MG tablet Take 1 tablet by mouth daily. 90 tablet 3   No current facility-administered medications for this visit.    REVIEW OF SYSTEMS:   Constitutional: ( - ) fevers, ( - )  chills , ( - ) night sweats Eyes: ( - ) blurriness of vision, ( - ) double vision, ( - ) watery eyes Ears, nose, mouth, throat, and face: ( - ) mucositis, ( - )  sore throat Respiratory: ( - ) cough, ( - ) dyspnea, ( - ) wheezes Cardiovascular: ( - ) palpitation, ( - ) chest discomfort, ( - ) lower extremity swelling Gastrointestinal:  ( - ) nausea, ( - ) heartburn, ( - ) change in bowel habits Skin: ( - ) abnormal skin rashes Lymphatics: ( - ) new lymphadenopathy, ( - ) easy bruising Neurological: ( - ) numbness, ( - ) tingling, ( - ) new weaknesses Behavioral/Psych: ( - ) mood change, ( - ) new changes  All other systems were reviewed with the patient and are negative.  PHYSICAL EXAMINATION: ECOG PERFORMANCE STATUS: 0 - Asymptomatic  Vitals:   06/26/23 1059  BP: (!) 150/66  Pulse: 64  Resp: 13  Temp: 98.3 F (36.8 C)  SpO2: 99%    Filed Weights   06/26/23 1059  Weight: 175 lb (79.4 kg)     GENERAL: Well-appearing elderly Caucasian male, alert, no distress and comfortable SKIN: skin color, texture, turgor are normal, no rashes or significant lesions EYES: conjunctiva are pink and non-injected, sclera clear LUNGS: clear to auscultation and percussion with normal breathing effort HEART: regular rate & rhythm and no murmurs and no lower extremity edema. Musculoskeletal: no cyanosis of digits and no clubbing  PSYCH: alert & oriented x 3, fluent speech NEURO: no focal motor/sensory deficits  LABORATORY DATA:  I have reviewed the data as listed    Latest Ref Rng & Units 06/26/2023   10:08 AM 03/20/2023   10:32 AM 12/23/2022    9:49 AM  CBC  WBC 4.0 -  10.5 K/uL 6.4  6.1  6.5   Hemoglobin 13.0 - 17.0 g/dL 16.1  09.6  04.5   Hematocrit 39.0 - 52.0 % 45.6  44.5  45.6   Platelets 150 - 400 K/uL 209  205  206        Latest Ref Rng & Units 06/26/2023   10:08 AM 04/25/2023   10:18 AM 01/25/2023    9:51 AM  CMP  Glucose 70 - 99 mg/dL 57  74  59   BUN 8 - 23 mg/dL 20  17  19    Creatinine 0.61 - 1.24 mg/dL 4.09  8.11  9.14   Sodium 135 - 145 mmol/L 140  140  141   Potassium 3.5 - 5.1 mmol/L 4.1  4.0  3.8   Chloride 98 - 111 mmol/L 102  103  100   CO2 22 - 32 mmol/L 33  28  24   Calcium 8.9 - 10.3 mg/dL 9.5  9.3  9.5   Total Protein 6.5 - 8.1 g/dL 7.1  6.7    Total Bilirubin 0.0 - 1.2 mg/dL 0.8  1.0    Alkaline Phos 38 - 126 U/L 132  130    AST 15 - 41 U/L 25  42    ALT 0 - 44 U/L 29  37     RADIOGRAPHIC STUDIES: I have personally reviewed the radiological images as listed and agreed with the findings in the report. No results found.  ASSESSMENT & PLAN Logan [name] 73 y.o. male with medical history significant for hereditary hemachromatosis presents for a follow up visit.  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  The patient was previously treated with phlebotomy when he was previously diagnosed by the GI department.  Due to elevations in ferritin he was referred to  hematology in order to manage his disorder.  Our recommendation would be for a goal ferritin of less than 50.  We will achieve this with phlebotomies performed every 2 weeks until he reaches target.    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   All questions were answered. The patient knows to call the clinic with any problems, questions or concerns.  A total of more than 25 minutes were spent on this encounter with face-to-face time and non-face-to-face time, including preparing to see the patient, ordering tests and/or medications, counseling the patient and coordination of care as outlined above.   Ulysees Barns, MD Department of Hematology/Oncology [facility] at [facility] Phone: [phone] Pager: [phone] Email: [email or web address]  06/27/2023 2:24 PM

## 2023-06-26 ENCOUNTER — Inpatient Hospital Stay (HOSPITAL_BASED_OUTPATIENT_CLINIC_OR_DEPARTMENT_OTHER): Payer: Medicare Other | Admitting: Hematology and Oncology

## 2023-06-26 ENCOUNTER — Inpatient Hospital Stay: Payer: Medicare Other | Attending: Hematology and Oncology

## 2023-06-26 DIAGNOSIS — Z8042 Family history of malignant neoplasm of prostate: Secondary | ICD-10-CM | POA: Insufficient documentation

## 2023-06-26 DIAGNOSIS — Z800 Family history of malignant neoplasm of digestive organs: Secondary | ICD-10-CM | POA: Diagnosis not present

## 2023-06-26 LAB — CMP (CANCER CENTER ONLY)
ALT: 29 U/L (ref 0–44)
AST: 25 U/L (ref 15–41)
Albumin: 4.2 g/dL (ref 3.5–5.0)
Alkaline Phosphatase: 132 U/L — ABNORMAL HIGH (ref 38–126)
Anion gap: 5 (ref 5–15)
BUN: 20 mg/dL (ref 8–23)
CO2: 33 mmol/L — ABNORMAL HIGH (ref 22–32)
Calcium: 9.5 mg/dL (ref 8.9–10.3)
Chloride: 102 mmol/L (ref 98–111)
Creatinine: 1.36 mg/dL — ABNORMAL HIGH (ref 0.61–1.24)
GFR, Estimated: 55 mL/min — ABNORMAL LOW (ref >60)
Glucose, Bld: 57 mg/dL — ABNORMAL LOW (ref 70–99)
Potassium: 4.1 mmol/L (ref 3.5–5.1)
Sodium: 140 mmol/L (ref 135–145)
Total Bilirubin: 0.8 mg/dL (ref 0.0–1.2)
Total Protein: 7.1 g/dL (ref 6.5–8.1)

## 2023-06-26 LAB — CBC WITH DIFFERENTIAL (CANCER CENTER ONLY)
Abs Immature Granulocytes: 0.01 10*3/uL (ref 0.00–0.07)
Basophils Absolute: 0.1 10*3/uL (ref 0.0–0.1)
Basophils Relative: 2 %
Eosinophils Absolute: 0.6 10*3/uL — ABNORMAL HIGH (ref 0.0–0.5)
Eosinophils Relative: 10 %
HCT: 45.6 % (ref 39.0–52.0)
Hemoglobin: 15.9 g/dL (ref 13.0–17.0)
Immature Granulocytes: 0 %
Lymphocytes Relative: 30 %
Lymphs Abs: 1.9 10*3/uL (ref 0.7–4.0)
MCH: 31.9 pg (ref 26.0–34.0)
MCHC: 34.9 g/dL (ref 30.0–36.0)
MCV: 91.6 fL (ref 80.0–100.0)
Monocytes Absolute: 0.8 10*3/uL (ref 0.1–1.0)
Monocytes Relative: 12 %
Neutro Abs: 3 10*3/uL (ref 1.7–7.7)
Neutrophils Relative %: 46 %
Platelet Count: 209 10*3/uL (ref 150–400)
RBC: 4.98 MIL/uL (ref 4.22–5.81)
RDW: 13.3 % (ref 11.5–15.5)
WBC Count: 6.4 10*3/uL (ref 4.0–10.5)
nRBC: 0 % (ref 0.0–0.2)

## 2023-06-26 LAB — IRON AND IRON BINDING CAPACITY (CC-WL,HP ONLY)
Iron: 154 ug/dL (ref 45–182)
Saturation Ratios: 66 % — ABNORMAL HIGH (ref 17.9–39.5)
TIBC: 235 ug/dL — ABNORMAL LOW (ref 250–450)
UIBC: 81 ug/dL — ABNORMAL LOW (ref 117–376)

## 2023-06-26 LAB — FERRITIN: Ferritin: 72 ng/mL (ref 24–336)

## 2023-06-27 ENCOUNTER — Encounter: Payer: Self-pay | Admitting: Hematology and Oncology

## 2023-06-27 DIAGNOSIS — E039 Hypothyroidism, unspecified: Secondary | ICD-10-CM | POA: Diagnosis not present

## 2023-06-27 DIAGNOSIS — E109 Type 1 diabetes mellitus without complications: Secondary | ICD-10-CM | POA: Diagnosis not present

## 2023-06-27 DIAGNOSIS — E1140 Type 2 diabetes mellitus with diabetic neuropathy, unspecified: Secondary | ICD-10-CM | POA: Diagnosis not present

## 2023-06-27 DIAGNOSIS — I2510 Atherosclerotic heart disease of native coronary artery without angina pectoris: Secondary | ICD-10-CM | POA: Diagnosis not present

## 2023-06-27 DIAGNOSIS — E11319 Type 2 diabetes mellitus with unspecified diabetic retinopathy without macular edema: Secondary | ICD-10-CM | POA: Diagnosis not present

## 2023-06-27 DIAGNOSIS — E7800 Pure hypercholesterolemia, unspecified: Secondary | ICD-10-CM | POA: Diagnosis not present

## 2023-06-27 DIAGNOSIS — I10 Essential (primary) hypertension: Secondary | ICD-10-CM | POA: Diagnosis not present

## 2023-07-01 ENCOUNTER — Other Ambulatory Visit: Payer: Self-pay | Admitting: Internal Medicine

## 2023-07-01 DIAGNOSIS — E039 Hypothyroidism, unspecified: Secondary | ICD-10-CM

## 2023-07-01 DIAGNOSIS — I10 Essential (primary) hypertension: Secondary | ICD-10-CM

## 2023-07-02 NOTE — Progress Notes (Unsigned)
Cardiology Office Note   Date:  07/04/2023   ID:  Logan [name], Logan [name] March 22, 1951, MRN [id number]  PCP:  Etta Grandchild, MD  Cardiologist:   Dietrich Pates, MD    F?U of CAD and HTN     History of Present Illness: Logan [name] is a 73 y.o. male with a history of history of hemachromatosis, DM, HL, HTN  CAD. 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 He had nonobstructive disease at catheterization in 2004. He had a negative Myoview scan in 2009 with an ejection fraction of 49%.   Pt has also had syncope in past  Known bradycardia.  I saw the pt in June 2024  HE was seen for HTN since   The pt denies CP  Breathing is OK   Energy overall is very good   Yesterday was down a little bit Follows in Heme clinic for hemachromatosis.   Having problems with ears  (fluid, seen in ENT) Very active  HIkes   Hunts regularly    No limitations    Current Meds  Medication Sig   amLODipine (NORVASC) 10 MG tablet TAKE 1 TABLET(10 MG) BY MOUTH DAILY   aspirin 81 MG tablet Take 81 mg by mouth 2 (two) times daily.   Continuous Blood Gluc Receiver (FREESTYLE LIBRE 14 DAY READER) DEVI    Continuous Blood Gluc Sensor (FREESTYLE LIBRE 14 DAY SENSOR) MISC    insulin glargine, 2 Unit Dial, (TOUJEO MAX SOLOSTAR) 300 UNIT/ML Solostar Pen Inject 20 Units into the skin at bedtime. (Patient taking differently: Inject 19 Units into the skin at bedtime.)   insulin NPH-regular Human (NOVOLIN 70/30) (70-30) 100 UNIT/ML injection Inject 20 Units into the skin 2 (two) times daily with a meal.   irbesartan (AVAPRO) 300 MG tablet TAKE 1 TABLET BY MOUTH EVERY DAY   levothyroxine (SYNTHROID) 88 MCG tablet TAKE 1 TABLET(88 MCG) BY MOUTH DAILY   [DISCONTINUED] rosuvastatin (CRESTOR) 10 MG tablet Take 1 tablet (10 mg total) by mouth daily.   [DISCONTINUED] triamterene-hydrochlorothiazide (MAXZIDE-25) 37.5-25 MG tablet Take 1 tablet by mouth daily.     Allergies:   Patient has no known allergies.   Past  Medical History:  Diagnosis Date   Aortic atherosclerosis (HCC)    Cataract    Coronary artery disease involving native coronary artery of native heart without angina pectoris 11/01/2007   Last Cath '04: LAD w/ 60% long lesion; 1st Dx 70% ostial; 2nd Dx  70% ostial; Cx 30% ostial; RCA-dominant multiple 30-40% lesions; PDA 30% lesions  Last nuclear stress March '09 - negative for ischemia    Diabetes mellitus    Diabetic retinopathy    Hearing loss    Hemochromatosis    Hyperlipidemia    Hypertension    Tubular adenoma of colon     Past Surgical History:  Procedure Laterality Date   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   Procedure: Right Great Toe Amputation;  Surgeon: Nadara Mustard, MD;  Location: [facility];  Service: Orthopedics;  Laterality: Right;  Right Great Toe Amputation   INNER EAR SURGERY     right     Social History:  The patient  reports that he has never smoked. He has never used smokeless tobacco. He reports current alcohol use. He reports that he does not use drugs.   Family History:  The patient's family history includes Alcohol abuse in his father; Asthma in his mother; Blindness in his paternal grandmother; Cancer in  his father; Colon cancer in his maternal uncle; Diabetes in his brother, father, paternal grandmother, and sister; Emphysema in his mother; Heart failure in his mother; Stroke in his father.    ROS:  Please see the history of present illness. All other systems are reviewed and  Negative to the above problem except as noted.    PHYSICAL EXAM: VS:  BP (!) 140/70   Pulse 67   Ht 6\' 2"  (1.88 m)   Wt 175 lb (79.4 kg)   SpO2 96%   BMI 22.47 kg/m    GEN: Thin 73 year old, in no acute distress HEENT: normal  Neck: JVP is normal   No carotid bruit Cardiac: RRR; no murmur  No LE edema  Respiratory:  clear to auscultation GI: soft, nontender, nondistended, + BS  No hepatomegaly    EKG:  EKG is not ordered  Lipid Panel    Component Value Date/Time   CHOL 106  04/25/2023 1018   TRIG 56.0 04/25/2023 1018   HDL 44.40 04/25/2023 1018   CHOLHDL 2 04/25/2023 1018   VLDL 11.2 04/25/2023 1018   LDLCALC 50 04/25/2023 1018      Wt Readings from Last 3 Encounters:  07/04/23 175 lb (79.4 kg)  06/26/23 175 lb (79.4 kg)  04/25/23 177 lb 6.4 oz (80.5 kg)      ASSESSMENT AND PLAN:  1  CADRemote intervention  No CP     2  HTN   BP on recheck is 145/70    Pt reluctant to lower more  Doesn't want to be a zombie   Says in a deer stand it is low    Will try hydralazine 10 mg bid   I have given him an envelope to mail in responses  If feels bad can stop  Just asked that he write back    3  HL  LDL 50  HDL 44     Trig 56  Excellent  control   4  Hx bradycardia   HR is OK   5.  DM   A1C in Jan was 8  Seen by Dr Talmage Nap  Reviewed diet   Recomm more protein, eggs  6  Hemachromatosis  Follows with Dr Leonides Schanz in heme clinc    Current medicines are reviewed at length with the patient today.  The patient does not have concerns regarding medicines.  Signed, Dietrich Pates, MD  07/04/2023 8:25 AM    [facility] [address] [place], [place], [place]  [address] Phone: [phone]; Fax: [phone]

## 2023-07-04 ENCOUNTER — Encounter: Payer: Self-pay | Admitting: Internal Medicine

## 2023-07-04 ENCOUNTER — Ambulatory Visit: Payer: Medicare Other | Attending: Internal Medicine | Admitting: Internal Medicine

## 2023-07-04 DIAGNOSIS — I10 Essential (primary) hypertension: Secondary | ICD-10-CM | POA: Diagnosis not present

## 2023-07-04 MED ORDER — TRIAMTERENE-HCTZ 37.5-25 MG PO TABS: 1.0000 | ORAL_TABLET | Freq: Every day | ORAL | 3 refills | Status: DC

## 2023-07-04 MED ORDER — HYDRALAZINE HCL 10 MG PO TABS: 10.0000 mg | ORAL_TABLET | Freq: Two times a day (BID) | ORAL | 3 refills | Status: DC

## 2023-07-04 MED ORDER — ROSUVASTATIN CALCIUM 10 MG PO TABS: 10.0000 mg | ORAL_TABLET | Freq: Every day | ORAL | 3 refills | Status: AC

## 2023-07-04 NOTE — Patient Instructions (Signed)
Medication Instructions:  HYDRALAZINE 10 MG TWICE A DAY  *If you need a refill on your cardiac medications before your next appointment, please call your pharmacy*   Lab Work:  If you have labs (blood work) drawn today and your tests are completely normal, you will receive your results only by: MyChart Message (if you have MyChart) OR A paper copy in the mail If you have any lab test that is abnormal or we need to change your treatment, we will call you to review the results.   Testing/Procedures:    Follow-Up: At [facility], you and your health needs are our priority.  As part of our continuing mission to provide you with exceptional heart care, we have created designated Provider Care Teams.  These Care Teams include your primary Cardiologist (physician) and Advanced Practice Providers (APPs -  Physician Assistants and Nurse Practitioners) who all work together to provide you with the care you need, when you need it.  We recommend signing up for the patient portal called "MyChart".  Sign up information is provided on this After Visit Summary.  MyChart is used to connect with patients for Virtual Visits (Telemedicine).  Patients are able to view lab/test results, encounter notes, upcoming appointments, etc.  Non-urgent messages can be sent to your provider as well.   To learn more about what you can do with MyChart, go to [email or web address].

## 2023-07-05 ENCOUNTER — Other Ambulatory Visit: Payer: Self-pay | Admitting: Internal Medicine

## 2023-07-05 DIAGNOSIS — E109 Type 1 diabetes mellitus without complications: Secondary | ICD-10-CM

## 2023-07-19 ENCOUNTER — Encounter: Payer: Self-pay | Admitting: Hematology and Oncology

## 2023-07-19 ENCOUNTER — Other Ambulatory Visit (HOSPITAL_COMMUNITY): Payer: Self-pay

## 2023-07-19 ENCOUNTER — Telehealth (HOSPITAL_COMMUNITY): Payer: Self-pay | Admitting: Pharmacy Technician

## 2023-07-19 NOTE — Telephone Encounter (Signed)
Pharmacy Patient Advocate Encounter   Received notification from  [employer]  that prior authorization for INSULIN GLARGINE 300U/ML is required/requested.   Insurance verification completed.   The patient is insured through [employer] .   Per test claim:  BRAND NAME TOUJEO MAX SOLOSTAR 300U/ML is preferred by the insurance.  If suggested medication is appropriate, Please send in a new RX and discontinue this one. If not, please advise as to why it's not appropriate so that we may request a Prior Authorization. Please note, some preferred medications may still require a PA.  If the suggested medications have not been trialed and there are no contraindications to their use, the PA will not be submitted, as it will not be approved.

## 2023-07-26 ENCOUNTER — Ambulatory Visit: Payer: Medicare Other | Admitting: Internal Medicine

## 2023-08-22 DIAGNOSIS — H906 Mixed conductive and sensorineural hearing loss, bilateral: Secondary | ICD-10-CM | POA: Diagnosis not present

## 2023-08-22 DIAGNOSIS — H6993 Unspecified Eustachian tube disorder, bilateral: Secondary | ICD-10-CM | POA: Diagnosis not present

## 2023-08-24 ENCOUNTER — Ambulatory Visit (INDEPENDENT_AMBULATORY_CARE_PROVIDER_SITE_OTHER): Payer: Medicare Other | Admitting: Internal Medicine

## 2023-08-24 ENCOUNTER — Encounter: Payer: Self-pay | Admitting: Internal Medicine

## 2023-08-24 VITALS — BP 140/60 | HR 80 | Temp 97.7°F | Ht 74.0 in | Wt 173.6 lb

## 2023-08-24 DIAGNOSIS — E039 Hypothyroidism, unspecified: Secondary | ICD-10-CM

## 2023-08-24 DIAGNOSIS — M25511 Pain in right shoulder: Secondary | ICD-10-CM

## 2023-08-24 DIAGNOSIS — K635 Polyp of colon: Secondary | ICD-10-CM | POA: Diagnosis not present

## 2023-08-24 DIAGNOSIS — G8929 Other chronic pain: Secondary | ICD-10-CM

## 2023-08-24 DIAGNOSIS — M25512 Pain in left shoulder: Secondary | ICD-10-CM

## 2023-08-24 DIAGNOSIS — E109 Type 1 diabetes mellitus without complications: Secondary | ICD-10-CM | POA: Diagnosis not present

## 2023-08-24 DIAGNOSIS — I10 Essential (primary) hypertension: Secondary | ICD-10-CM

## 2023-08-24 DIAGNOSIS — Z23 Encounter for immunization: Secondary | ICD-10-CM

## 2023-08-24 LAB — URINALYSIS, ROUTINE W REFLEX MICROSCOPIC
Bilirubin Urine: NEGATIVE
Hgb urine dipstick: NEGATIVE
Ketones, ur: NEGATIVE
Leukocytes,Ua: NEGATIVE
Nitrite: NEGATIVE
RBC / HPF: NONE SEEN (ref 0–?)
Specific Gravity, Urine: <=1.005 — AB (ref 1.000–1.030)
Total Protein, Urine: 30 — AB
Urine Glucose: NEGATIVE
Urobilinogen, UA: 0.2 (ref 0.0–1.0)
pH: 6 (ref 5.0–8.0)

## 2023-08-24 LAB — HEMOGLOBIN A1C: Hgb A1c MFr Bld: 7.8 % — ABNORMAL HIGH (ref 4.6–6.5)

## 2023-08-24 LAB — MICROALBUMIN / CREATININE URINE RATIO
Creatinine,U: 81.7 mg/dL
Microalb Creat Ratio: 297.4 mg/g — ABNORMAL HIGH (ref 0.0–30.0)
Microalb, Ur: 24.3 mg/dL — ABNORMAL HIGH (ref 0.0–1.9)

## 2023-08-24 LAB — BASIC METABOLIC PANEL
BUN: 15 mg/dL (ref 6–23)
CO2: 29 meq/L (ref 19–32)
Calcium: 9.8 mg/dL (ref 8.4–10.5)
Chloride: 102 meq/L (ref 96–112)
Creatinine, Ser: 1.18 mg/dL (ref 0.40–1.50)
GFR: 61.63 mL/min (ref >60.00)
Glucose, Bld: 53 mg/dL — ABNORMAL LOW (ref 70–99)
Potassium: 3.4 meq/L — ABNORMAL LOW (ref 3.5–5.1)
Sodium: 141 meq/L (ref 135–145)

## 2023-08-24 LAB — TSH: TSH: 2.23 u[IU]/mL (ref 0.35–5.50)

## 2023-08-24 MED ORDER — SHINGRIX 50 MCG/0.5ML IM SUSR: 0.5000 mL | Freq: Once | INTRAMUSCULAR | 1 refills | Status: AC

## 2023-08-24 NOTE — Progress Notes (Unsigned)
 Subjective:  Patient ID: FRANK NOVELO, male    DOB: [birth year]  Age: 73 y.o. MRN: [id number]  CC: Hypertension, Diabetes, and Hypothyroidism   HPI RAEKWON WINKOWSKI presents for f/up -----  Discussed the use of AI scribe software for clinical note transcription with the patient, who gave verbal consent to proceed.  History of Present Illness   DELIO SLATES is a 73 year old male who presents with concerns about joint soreness and medication management.  He has been experiencing joint soreness in both shoulders, which began approximately five to six days after starting hydralazine. The sensation is described as soreness rather than pain and occurs when he raises his shoulders. No muscle aches are reported.  He has a history of high blood pressure. His blood pressure was slightly elevated during a previous visit, leading to the addition of hydralazine to his medication regimen. He is unsure if the medication change has significantly impacted his blood pressure control.  He discusses his insulin management, noting that he was previously on a Toujeo pen since November, but due to insurance coverage issues, he may need to switch to [place]. He currently has two pens of Toujeo remaining.  He mentions a past colonoscopy approximately six to seven years ago and expresses a need for another one but is unable to proceed due to the lack of someone to accompany him. He also discusses a previous shingles vaccine received about twelve years ago and acknowledges the need for an updated vaccination.  He reports ongoing numbness in LUE, which he describes as an 'ongoing problem.' He also mentions engaging in physical activities such as climbing ladders and trees, which he acknowledges are not recommended but states he feels 'all right.'       Outpatient Medications Prior to Visit  Medication Sig Dispense Refill   amLODipine (NORVASC) 10 MG tablet TAKE 1 TABLET(10 MG) BY MOUTH DAILY 90 tablet 0   aspirin  81 MG tablet Take 81 mg by mouth 2 (two) times daily.     Continuous Blood Gluc Receiver (FREESTYLE LIBRE 14 DAY READER) DEVI      Continuous Blood Gluc Sensor (FREESTYLE LIBRE 14 DAY SENSOR) MISC      hydrALAZINE (APRESOLINE) 10 MG tablet Take 1 tablet (10 mg total) by mouth in the morning and at bedtime. 180 tablet 3   insulin NPH-regular Human (NOVOLIN 70/30) (70-30) 100 UNIT/ML injection Inject 20 Units into the skin 2 (two) times daily with a meal. 48 mL 0   irbesartan (AVAPRO) 300 MG tablet TAKE 1 TABLET BY MOUTH EVERY DAY 90 tablet 0   levothyroxine (SYNTHROID) 88 MCG tablet TAKE 1 TABLET(88 MCG) BY MOUTH DAILY 90 tablet 0   rosuvastatin (CRESTOR) 10 MG tablet Take 1 tablet (10 mg total) by mouth daily. 90 tablet 3   TOUJEO MAX SOLOSTAR 300 UNIT/ML Solostar Pen ADMINISTER 20 UNITS UNDER THE SKIN AT BEDTIME 6 mL 0   triamterene-hydrochlorothiazide (MAXZIDE-25) 37.5-25 MG tablet Take 1 tablet by mouth daily. 90 tablet 3   No facility-administered medications prior to visit.    ROS Review of Systems  HENT:  Positive for hearing loss.   Musculoskeletal:  Positive for arthralgias.  Skin: Negative.   Psychiatric/Behavioral:  Positive for confusion and decreased concentration.     Objective:  BP (!) 140/60 (BP Location: Left Arm, Patient Position: Sitting, Cuff Size: Normal)   Pulse 80   Temp 97.7 F (36.5 C) (Temporal)   Ht 6\' 2"  (1.88 m)  Wt 173 lb 9.6 oz (78.7 kg)   SpO2 97%   BMI 22.29 kg/m   BP Readings from Last 3 Encounters:  08/24/23 (!) 140/60  07/04/23 (!) 140/70  06/26/23 (!) 150/66    Wt Readings from Last 3 Encounters:  08/24/23 173 lb 9.6 oz (78.7 kg)  07/04/23 175 lb (79.4 kg)  06/26/23 175 lb (79.4 kg)    Physical Exam Vitals reviewed.  Constitutional:      Appearance: Normal appearance.  HENT:     Nose: Nose normal.  Eyes:     General: No scleral icterus.    Conjunctiva/sclera: Conjunctivae normal.  Cardiovascular:     Rate and Rhythm: Normal  rate.     Heart sounds: No murmur heard.    No friction rub. No gallop.     Comments: EKG--- NSR, 77 bpm LAD Inferior,antero,septal infarct patterns are old No LVH Unchanged  Pulmonary:     Effort: Pulmonary effort is normal.     Breath sounds: No stridor. No wheezing, rhonchi or rales.  Abdominal:     General: Abdomen is flat.     Palpations: There is no mass.     Tenderness: There is no abdominal tenderness. There is no guarding.     Hernia: No hernia is present.  Musculoskeletal:        General: Normal range of motion.     Cervical back: Neck supple.     Right lower leg: No edema.     Left lower leg: No edema.  Skin:    General: Skin is warm and dry.  Neurological:     General: No focal deficit present.     Mental Status: He is alert. Mental status is at baseline.  Psychiatric:        Mood and Affect: Mood normal.        Behavior: Behavior normal.     Lab Results  Component Value Date   WBC 6.4 06/26/2023   HGB 15.9 06/26/2023   HCT 45.6 06/26/2023   PLT 209 06/26/2023   GLUCOSE 53 (L) 08/24/2023   CHOL 106 04/25/2023   TRIG 56.0 04/25/2023   HDL 44.40 04/25/2023   LDLCALC 50 04/25/2023   ALT 29 06/26/2023   AST 25 06/26/2023   NA 141 08/24/2023   K 3.4 (L) 08/24/2023   CL 102 08/24/2023   CREATININE 1.18 08/24/2023   BUN 15 08/24/2023   CO2 29 08/24/2023   TSH 2.23 08/24/2023   PSA 3.54 04/25/2023   HGBA1C 7.8 (H) 08/24/2023   MICROALBUR 24.3 (H) 08/24/2023    DG Chest Port 1 View Result Date: 08/31/2021 CLINICAL DATA:  Nausea EXAM: PORTABLE CHEST 1 VIEW COMPARISON:  None. FINDINGS: Cardiac and mediastinal contours are within normal limits. No focal pulmonary opacity. No pleural effusion or pneumothorax. No acute osseous abnormality. IMPRESSION: No acute cardiopulmonary process. Electronically Signed   By: Wiliam Ke M.D.   On: 08/31/2021 13:55    Assessment & Plan:  Essential hypertension -     EKG 12-Lead -     Basic metabolic panel;  Future -     Urinalysis, Routine w reflex microscopic; Future  Polyp of colon, unspecified part of colon, unspecified type -     Ambulatory referral to Gastroenterology  Need for shingles vaccine -     Shingrix; Inject 0.5 mLs into the muscle once for 1 dose.  Dispense: 0.5 mL; Refill: 1  Acquired hypothyroidism -     TSH; Future  Type 1 diabetes  mellitus without complication (HCC) -     Hemoglobin A1c; Future -     Basic metabolic panel; Future -     Microalbumin / creatinine urine ratio; Future -     Urinalysis, Routine w reflex microscopic; Future  Chronic pain of both shoulders -     Ambulatory referral to Orthopedic Surgery     Follow-up: Return in about 4 months (around 12/24/2023).  Sanda Linger, MD

## 2023-08-24 NOTE — Patient Instructions (Signed)
 Hypertension, Adult High blood pressure (hypertension) is when the force of blood pumping through the arteries is too strong. The arteries are the blood vessels that carry blood from the heart throughout the body. Hypertension forces the heart to work harder to pump blood and may cause arteries to become narrow or stiff. Untreated or uncontrolled hypertension can lead to a heart attack, heart failure, a stroke, kidney disease, and other problems. A blood pressure reading consists of a higher number over a lower number. Ideally, your blood pressure should be below 120/80. The first ("top") number is called the systolic pressure. It is a measure of the pressure in your arteries as your heart beats. The second ("bottom") number is called the diastolic pressure. It is a measure of the pressure in your arteries as the heart relaxes. What are the causes? The exact cause of this condition is not known. There are some conditions that result in high blood pressure. What increases the risk? Certain factors may make you more likely to develop high blood pressure. Some of these risk factors are under your control, including: Smoking. Not getting enough exercise or physical activity. Being overweight. Having too much fat, sugar, calories, or salt (sodium) in your diet. Drinking too much alcohol. Other risk factors include: Having a personal history of heart disease, diabetes, high cholesterol, or kidney disease. Stress. Having a family history of high blood pressure and high cholesterol. Having obstructive sleep apnea. Age. The risk increases with age. What are the signs or symptoms? High blood pressure may not cause symptoms. Very high blood pressure (hypertensive crisis) may cause: Headache. Fast or irregular heartbeats (palpitations). Shortness of breath. Nosebleed. Nausea and vomiting. Vision changes. Severe chest pain, dizziness, and seizures. How is this diagnosed? This condition is diagnosed by  measuring your blood pressure while you are seated, with your arm resting on a flat surface, your legs uncrossed, and your feet flat on the floor. The cuff of the blood pressure monitor will be placed directly against the skin of your upper arm at the level of your heart. Blood pressure should be measured at least twice using the same arm. Certain conditions can cause a difference in blood pressure between your right and left arms. If you have a high blood pressure reading during one visit or you have normal blood pressure with other risk factors, you may be asked to: Return on a different day to have your blood pressure checked again. Monitor your blood pressure at home for 1 week or longer. If you are diagnosed with hypertension, you may have other blood or imaging tests to help your health care provider understand your overall risk for other conditions. How is this treated? This condition is treated by making healthy lifestyle changes, such as eating healthy foods, exercising more, and reducing your alcohol intake. You may be referred for counseling on a healthy diet and physical activity. Your health care provider may prescribe medicine if lifestyle changes are not enough to get your blood pressure under control and if: Your systolic blood pressure is above 130. Your diastolic blood pressure is above 80. Your personal target blood pressure may vary depending on your medical conditions, your age, and other factors. Follow these instructions at home: Eating and drinking  Eat a diet that is high in fiber and potassium, and low in sodium, added sugar, and fat. An example of this eating plan is called the DASH diet. DASH stands for Dietary Approaches to Stop Hypertension. To eat this way: Eat  plenty of fresh fruits and vegetables. Try to fill one half of your plate at each meal with fruits and vegetables. Eat whole grains, such as whole-wheat pasta, brown rice, or whole-grain bread. Fill about one  fourth of your plate with whole grains. Eat or drink low-fat dairy products, such as skim milk or low-fat yogurt. Avoid fatty cuts of meat, processed or cured meats, and poultry with skin. Fill about one fourth of your plate with lean proteins, such as fish, chicken without skin, beans, eggs, or tofu. Avoid pre-made and processed foods. These tend to be higher in sodium, added sugar, and fat. Reduce your daily sodium intake. Many people with hypertension should eat less than 1,500 mg of sodium a day. Do not drink alcohol if: Your health care provider tells you not to drink. You are pregnant, may be pregnant, or are planning to become pregnant. If you drink alcohol: Limit how much you have to: 0-1 drink a day for women. 0-2 drinks a day for men. Know how much alcohol is in your drink. In the [place] one drink equals one 12 oz bottle of beer (355 mL), one 5 oz glass of wine (148 mL), or one 1 oz glass of hard liquor (44 mL). Lifestyle  Work with your health care provider to maintain a healthy body weight or to lose weight. Ask what an ideal weight is for you. Get at least 30 minutes of exercise that causes your heart to beat faster (aerobic exercise) most days of the week. Activities may include walking, swimming, or biking. Include exercise to strengthen your muscles (resistance exercise), such as Pilates or lifting weights, as part of your weekly exercise routine. Try to do these types of exercises for 30 minutes at least 3 days a week. Do not use any products that contain nicotine or tobacco. These products include cigarettes, chewing tobacco, and vaping devices, such as e-cigarettes. If you need help quitting, ask your health care provider. Monitor your blood pressure at home as told by your health care provider. Keep all follow-up visits. This is important. Medicines Take over-the-counter and prescription medicines only as told by your health care provider. Follow directions carefully. Blood  pressure medicines must be taken as prescribed. Do not skip doses of blood pressure medicine. Doing this puts you at risk for problems and can make the medicine less effective. Ask your health care provider about side effects or reactions to medicines that you should watch for. Contact a health care provider if you: Think you are having a reaction to a medicine you are taking. Have headaches that keep coming back (recurring). Feel dizzy. Have swelling in your ankles. Have trouble with your vision. Get help right away if you: Develop a severe headache or confusion. Have unusual weakness or numbness. Feel faint. Have severe pain in your chest or abdomen. Vomit repeatedly. Have trouble breathing. These symptoms may be an emergency. Get help right away. Call 911. Do not wait to see if the symptoms will go away. Do not drive yourself to the hospital. Summary Hypertension is when the force of blood pumping through your arteries is too strong. If this condition is not controlled, it may put you at risk for serious complications. Your personal target blood pressure may vary depending on your medical conditions, your age, and other factors. For most people, a normal blood pressure is less than 120/80. Hypertension is treated with lifestyle changes, medicines, or a combination of both. Lifestyle changes include losing weight, eating a healthy,  low-sodium diet, exercising more, and limiting alcohol. This information is not intended to replace advice given to you by your health care provider. Make sure you discuss any questions you have with your health care provider. Document Revised: 03/30/2021 Document Reviewed: 03/30/2021 Elsevier Patient Education  2024 [employer].

## 2023-08-25 ENCOUNTER — Encounter: Payer: Self-pay | Admitting: Internal Medicine

## 2023-08-25 MED ORDER — LEVOTHYROXINE SODIUM 88 MCG PO TABS
88.0000 ug | ORAL_TABLET | Freq: Every day | ORAL | 1 refills | Status: DC
Start: 2023-08-25 — End: 2024-03-28

## 2023-08-25 MED ORDER — TRESIBA FLEXTOUCH 200 UNIT/ML ~~LOC~~ SOPN: 20.0000 [IU] | PEN_INJECTOR | Freq: Every day | SUBCUTANEOUS | 1 refills | Status: DC

## 2023-08-28 ENCOUNTER — Telehealth: Payer: Self-pay | Admitting: *Deleted

## 2023-08-28 NOTE — Progress Notes (Signed)
 Care Guide Pharmacy Note  08/28/2023 Name: TEVITA GOMER MRN: [id number] DOB: [birth year]  Referred By: Etta Grandchild, MD Reason for referral: Care Coordination (Outreach to schedule referral with pharmacist )   SAVEON PLANT is a 73 y.o. year old male who is a primary care patient of Etta Grandchild, MD.  [name] was referred to the pharmacist for assistance related to: HTN and DMII  Successful contact was made with the patient to discuss pharmacy services including being ready for the pharmacist to call at least 5 minutes before the scheduled appointment time and to have medication bottles and any blood pressure readings ready for review. The patient agreed to meet with the pharmacist via telephone visit on 09/14/2023  Burman Nieves, CMA, Care Guide [facility], [employer] [facility] Guide Direct Dial: [phone]  Fax: [phone] Website: [email or web address]

## 2023-09-14 ENCOUNTER — Other Ambulatory Visit (INDEPENDENT_AMBULATORY_CARE_PROVIDER_SITE_OTHER): Admitting: Pharmacist

## 2023-09-14 VITALS — BP 132/62

## 2023-09-14 DIAGNOSIS — I10 Essential (primary) hypertension: Secondary | ICD-10-CM

## 2023-09-14 DIAGNOSIS — E109 Type 1 diabetes mellitus without complications: Secondary | ICD-10-CM

## 2023-09-14 NOTE — Patient Instructions (Signed)
 It was a pleasure speaking with you today!  Continue current regimen. Notify your endocrinologist if you are having frequent readings below 70.  Feel free to call with any questions or concerns!  Arbutus Leas, PharmD, BCPS, CPP Clinical Pharmacist Practitioner [facility] [phone]

## 2023-09-14 NOTE — Progress Notes (Signed)
 09/14/2023 Name: Logan [name] MRN: [id number] DOB: [birth year]  Chief Complaint  Patient presents with   Hypertension   Diabetes   Medication Management    Logan [name] is a 73 y.o. year old male who presented for a telephone visit.   They were referred to the pharmacist by their PCP for assistance in managing diabetes and hypertension.   Subjective:  Care Team: Primary Care Provider: Etta Grandchild, MD ; Next Scheduled Visit: 12/26/23  Medication Access/Adherence  Current Pharmacy:  [id number] - [place], [place] - [address] AT [address] [name] [address] Phone: [phone] Fax: [phone]   Patient reports affordability concerns with their medications: No  Patient reports access/transportation concerns to their pharmacy: No  Patient reports adherence concerns with their medications:  No     Diabetes: Pt has had diabetes for over 50 years, has been on insulin over 40 years  Current medications: Tresiba 20 units daily, Novolin 70/30 19 units twice daily   Current glucose readings: BG 63 this morning BG before bed last night 117, always eat a snack before bed BG currently 122 Using Freestyle Libre   Current meal patterns:  Eats a lot of fruit, avoids red meat, eats chicken/fish  Current physical activity: Pt stays very active, works on his farm, goes on hikes   Hypertension:  Current medications: amlodipine 10 mg daily, hydralazine 10 mg BID, irbesartan 300 mg daily, triamterene/hydrochlorothiazide 37.5/25 mg daily Medications previously tried: losartan  Patient has a validated, automated, upper arm home BP cuff Current blood pressure readings: BP 132/62, highest 135/68  Patient denies hypotensive s/sx including dizziness, lightheadedness.    Objective:  Lab Results  Component Value Date   HGBA1C 7.8 (H) 08/24/2023    Lab Results  Component Value Date   CREATININE 1.18 08/24/2023   BUN 15 08/24/2023    NA 141 08/24/2023   K 3.4 (L) 08/24/2023   CL 102 08/24/2023   CO2 29 08/24/2023    Lab Results  Component Value Date   CHOL 106 04/25/2023   HDL 44.40 04/25/2023   LDLCALC 50 04/25/2023   TRIG 56.0 04/25/2023   CHOLHDL 2 04/25/2023    Medications Reviewed Today     Reviewed by Bonita Quin, RPH (Pharmacist) on 09/14/23 at 1132  Med List Status: <None>   Medication Order Taking? Sig Documenting Provider Last Dose Status Informant  amLODipine (NORVASC) 10 MG tablet [id number] Yes TAKE 1 TABLET(10 MG) BY MOUTH DAILY Etta Grandchild, MD Taking Active   aspirin 81 MG tablet [id number] Yes Take 81 mg by mouth 2 (two) times daily. [provider] Taking Active Self           Med Note Roma Kayser Aug 31, 2016  3:45 PM)    Continuous Blood Gluc Receiver (FREESTYLE LIBRE 14 DAY READER) DEVI [id number] Yes  [provider] Taking Active   Continuous Blood Gluc Sensor (FREESTYLE LIBRE 14 DAY SENSOR) [place] [id number] Yes  [provider] Taking Active   hydrALAZINE (APRESOLINE) 10 MG tablet [id number] Yes Take 1 tablet (10 mg total) by mouth in the morning and at bedtime. Pricilla Riffle, MD Taking Active   insulin degludec (TRESIBA FLEXTOUCH) 200 UNIT/ML FlexTouch Pen [id number] Yes Inject 20 Units into the skin daily. Etta Grandchild, MD Taking Active   insulin NPH-regular Human (NOVOLIN 70/30) (70-30) 100 UNIT/ML injection [id number] Yes Inject 20 Units into the skin 2 (two) times daily  with a meal. Etta Grandchild, MD Taking Active            Med Note Daryll Brod   Tue Apr 25, 2023  9:30 AM)    irbesartan (AVAPRO) 300 MG tablet [id number] Yes TAKE 1 TABLET BY MOUTH EVERY DAY Etta Grandchild, MD Taking Active   levothyroxine (SYNTHROID) 88 MCG tablet [id number] Yes Take 1 tablet (88 mcg total) by mouth daily before breakfast. Etta Grandchild, MD Taking Active   rosuvastatin (CRESTOR) 10 MG tablet [id number] Yes Take 1 tablet (10 mg total) by  mouth daily. Pricilla Riffle, MD Taking Active   triamterene-hydrochlorothiazide [facility]) 37.5-25 MG tablet [id number] Yes Take 1 tablet by mouth daily. Pricilla Riffle, MD Taking Active             Assessment/Plan:   Diabetes: - Currently controlled, A1c goal <8% - Reviewed dietary modifications including balanced snack prior to bedtime that includes fiber/protein - Recommend to continue current regimen; advised to notify endocrinologist if lows below 70 are occurring frequently   Hypertension: - Currently borderline controlled, BP goal <130/80 - diastolic runs low, systolic borderline Recommended to check home blood pressure and heart rate daily - Recommend to continue current regimen.     Follow Up Plan: PRN  Arbutus Leas, PharmD, BCPS, CPP Clinical Pharmacist Practitioner [facility] [phone]

## 2023-09-17 ENCOUNTER — Other Ambulatory Visit: Payer: Self-pay

## 2023-09-17 ENCOUNTER — Observation Stay (HOSPITAL_COMMUNITY)
Admission: EM | Admit: 2023-09-17 | Discharge: 2023-09-18 | Disposition: A | Discharge status: Home / Self Care | Attending: Internal Medicine | Admitting: Internal Medicine

## 2023-09-17 ENCOUNTER — Encounter (HOSPITAL_COMMUNITY): Payer: Self-pay

## 2023-09-17 DIAGNOSIS — Z7901 Long term (current) use of anticoagulants: Secondary | ICD-10-CM | POA: Diagnosis not present

## 2023-09-17 DIAGNOSIS — N182 Chronic kidney disease, stage 2 (mild): Secondary | ICD-10-CM | POA: Diagnosis not present

## 2023-09-17 DIAGNOSIS — R231 Pallor: Secondary | ICD-10-CM | POA: Diagnosis not present

## 2023-09-17 DIAGNOSIS — E785 Hyperlipidemia, unspecified: Secondary | ICD-10-CM | POA: Insufficient documentation

## 2023-09-17 DIAGNOSIS — R55 Syncope and collapse: Secondary | ICD-10-CM | POA: Diagnosis not present

## 2023-09-17 DIAGNOSIS — E876 Hypokalemia: Secondary | ICD-10-CM | POA: Diagnosis not present

## 2023-09-17 DIAGNOSIS — I129 Hypertensive chronic kidney disease with stage 1 through stage 4 chronic kidney disease, or unspecified chronic kidney disease: Secondary | ICD-10-CM | POA: Insufficient documentation

## 2023-09-17 DIAGNOSIS — Z79899 Other long term (current) drug therapy: Secondary | ICD-10-CM | POA: Insufficient documentation

## 2023-09-17 DIAGNOSIS — I213 ST elevation (STEMI) myocardial infarction of unspecified site: Secondary | ICD-10-CM | POA: Diagnosis not present

## 2023-09-17 DIAGNOSIS — L299 Pruritus, unspecified: Secondary | ICD-10-CM | POA: Diagnosis not present

## 2023-09-17 DIAGNOSIS — R110 Nausea: Secondary | ICD-10-CM | POA: Diagnosis not present

## 2023-09-17 DIAGNOSIS — F1090 Alcohol use, unspecified, uncomplicated: Secondary | ICD-10-CM | POA: Insufficient documentation

## 2023-09-17 DIAGNOSIS — D72829 Elevated white blood cell count, unspecified: Secondary | ICD-10-CM | POA: Insufficient documentation

## 2023-09-17 DIAGNOSIS — T782XXA Anaphylactic shock, unspecified, initial encounter: Principal | ICD-10-CM

## 2023-09-17 DIAGNOSIS — T63441A Toxic effect of venom of bees, accidental (unintentional), initial encounter: Secondary | ICD-10-CM | POA: Diagnosis not present

## 2023-09-17 DIAGNOSIS — T7840XA Allergy, unspecified, initial encounter: Secondary | ICD-10-CM | POA: Diagnosis present

## 2023-09-17 DIAGNOSIS — I5A Non-ischemic myocardial injury (non-traumatic): Secondary | ICD-10-CM | POA: Diagnosis not present

## 2023-09-17 DIAGNOSIS — R531 Weakness: Secondary | ICD-10-CM | POA: Diagnosis not present

## 2023-09-17 DIAGNOSIS — E1022 Type 1 diabetes mellitus with diabetic chronic kidney disease: Secondary | ICD-10-CM | POA: Insufficient documentation

## 2023-09-17 DIAGNOSIS — I214 Non-ST elevation (NSTEMI) myocardial infarction: Secondary | ICD-10-CM

## 2023-09-17 LAB — CBC WITH DIFFERENTIAL/PLATELET
Abs Immature Granulocytes: 0.04 10*3/uL (ref 0.00–0.07)
Basophils Absolute: 0.1 10*3/uL (ref 0.0–0.1)
Basophils Relative: 1 %
Eosinophils Absolute: 0.5 10*3/uL (ref 0.0–0.5)
Eosinophils Relative: 4 %
HCT: 46.8 % (ref 39.0–52.0)
Hemoglobin: 15.1 g/dL (ref 13.0–17.0)
Immature Granulocytes: 0 %
Lymphocytes Relative: 29 %
Lymphs Abs: 3.6 10*3/uL (ref 0.7–4.0)
MCH: 31.5 pg (ref 26.0–34.0)
MCHC: 32.3 g/dL (ref 30.0–36.0)
MCV: 97.5 fL (ref 80.0–100.0)
Monocytes Absolute: 1 10*3/uL (ref 0.1–1.0)
Monocytes Relative: 8 %
Neutro Abs: 7.2 10*3/uL (ref 1.7–7.7)
Neutrophils Relative %: 58 %
Platelets: 199 10*3/uL (ref 150–400)
RBC: 4.8 MIL/uL (ref 4.22–5.81)
RDW: 13.9 % (ref 11.5–15.5)
WBC: 12.3 10*3/uL — ABNORMAL HIGH (ref 4.0–10.5)
nRBC: 0 % (ref 0.0–0.2)

## 2023-09-17 LAB — GLUCOSE, CAPILLARY: Glucose-Capillary: 192 mg/dL — ABNORMAL HIGH (ref 70–99)

## 2023-09-17 LAB — BASIC METABOLIC PANEL WITH GFR
Anion gap: 11 (ref 5–15)
BUN: 16 mg/dL (ref 8–23)
CO2: 24 mmol/L (ref 22–32)
Calcium: 8.6 mg/dL — ABNORMAL LOW (ref 8.9–10.3)
Chloride: 106 mmol/L (ref 98–111)
Creatinine, Ser: 1.34 mg/dL — ABNORMAL HIGH (ref 0.61–1.24)
GFR, Estimated: 56 mL/min — ABNORMAL LOW (ref >60)
Glucose, Bld: 167 mg/dL — ABNORMAL HIGH (ref 70–99)
Potassium: 2.9 mmol/L — ABNORMAL LOW (ref 3.5–5.1)
Sodium: 141 mmol/L (ref 135–145)

## 2023-09-17 LAB — TROPONIN I (HIGH SENSITIVITY)
Troponin I (High Sensitivity): 7 ng/L (ref <18)
Troponin I (High Sensitivity): 112 ng/L (ref <18)

## 2023-09-17 MED ORDER — INSULIN ASPART 100 UNIT/ML IJ SOLN
0.0000 [IU] | Freq: Three times a day (TID) | INTRAMUSCULAR | Status: DC
  Administered 2023-09-18: 5 [IU] via SUBCUTANEOUS
  Administered 2023-09-18: 3 [IU] via SUBCUTANEOUS

## 2023-09-17 MED ORDER — TRIAMTERENE-HCTZ 37.5-25 MG PO TABS
1.0000 | ORAL_TABLET | Freq: Every day | ORAL | Status: DC
  Administered 2023-09-18: 1 via ORAL
  Filled 2023-09-17: qty 1

## 2023-09-17 MED ORDER — SODIUM CHLORIDE 0.9% FLUSH
3.0000 mL | Freq: Two times a day (BID) | INTRAVENOUS | Status: DC
  Administered 2023-09-17: 3 mL via INTRAVENOUS

## 2023-09-17 MED ORDER — MELATONIN 3 MG PO TABS: 6.0000 mg | ORAL_TABLET | Freq: Every evening | ORAL | Status: DC | PRN

## 2023-09-17 MED ORDER — AMLODIPINE BESYLATE 10 MG PO TABS
10.0000 mg | ORAL_TABLET | Freq: Every day | ORAL | Status: DC
  Administered 2023-09-18: 10 mg via ORAL
  Filled 2023-09-17: qty 1

## 2023-09-17 MED ORDER — POTASSIUM CHLORIDE 10 MEQ/100ML IV SOLN
10.0000 meq | INTRAVENOUS | Status: AC
  Administered 2023-09-17 – 2023-09-18 (×4): 10 meq via INTRAVENOUS
  Filled 2023-09-17 (×4): qty 100

## 2023-09-17 MED ORDER — HYDRALAZINE HCL 10 MG PO TABS
10.0000 mg | ORAL_TABLET | Freq: Two times a day (BID) | ORAL | Status: DC
  Administered 2023-09-17 – 2023-09-18 (×2): 10 mg via ORAL
  Filled 2023-09-17 (×2): qty 1

## 2023-09-17 MED ORDER — POLYETHYLENE GLYCOL 3350 17 G PO PACK: 17.0000 g | PACK | Freq: Every day | ORAL | Status: DC | PRN

## 2023-09-17 MED ORDER — ACETAMINOPHEN 500 MG PO TABS: 1000.0000 mg | ORAL_TABLET | Freq: Four times a day (QID) | ORAL | Status: DC | PRN

## 2023-09-17 MED ORDER — INSULIN ASPART PROT & ASPART (70-30 MIX) 100 UNIT/ML ~~LOC~~ SUSP
10.0000 [IU] | Freq: Two times a day (BID) | SUBCUTANEOUS | Status: DC
Start: 2023-09-18 — End: 2023-09-18
  Administered 2023-09-18: 10 [IU] via SUBCUTANEOUS
  Filled 2023-09-17: qty 10

## 2023-09-17 MED ORDER — ASPIRIN 81 MG PO CHEW
324.0000 mg | CHEWABLE_TABLET | Freq: Once | ORAL | Status: AC
  Administered 2023-09-17: 324 mg via ORAL
  Filled 2023-09-17: qty 4

## 2023-09-17 MED ORDER — ALBUTEROL SULFATE (2.5 MG/3ML) 0.083% IN NEBU: 2.5000 mg | INHALATION_SOLUTION | RESPIRATORY_TRACT | Status: DC | PRN

## 2023-09-17 MED ORDER — ASPIRIN 81 MG PO CHEW
81.0000 mg | CHEWABLE_TABLET | Freq: Two times a day (BID) | ORAL | Status: DC
  Administered 2023-09-18: 81 mg via ORAL
  Filled 2023-09-17: qty 1

## 2023-09-17 MED ORDER — FAMOTIDINE 20 MG PO TABS
20.0000 mg | ORAL_TABLET | Freq: Every day | ORAL | Status: DC
  Administered 2023-09-18: 20 mg via ORAL
  Filled 2023-09-17: qty 1

## 2023-09-17 MED ORDER — INSULIN GLARGINE-YFGN 100 UNIT/ML ~~LOC~~ SOLN
8.0000 [IU] | Freq: Every day | SUBCUTANEOUS | Status: DC
  Administered 2023-09-17: 8 [IU] via SUBCUTANEOUS
  Filled 2023-09-17 (×3): qty 0.08

## 2023-09-17 MED ORDER — ONDANSETRON HCL 4 MG/2ML IJ SOLN: 4.0000 mg | Freq: Four times a day (QID) | INTRAMUSCULAR | Status: DC | PRN

## 2023-09-17 MED ORDER — ENOXAPARIN SODIUM 40 MG/0.4ML IJ SOSY: 40.0000 mg | PREFILLED_SYRINGE | INTRAMUSCULAR | Status: DC

## 2023-09-17 MED ORDER — DEXAMETHASONE SODIUM PHOSPHATE 10 MG/ML IJ SOLN
10.0000 mg | Freq: Once | INTRAMUSCULAR | Status: AC
  Administered 2023-09-17: 10 mg via INTRAVENOUS
  Filled 2023-09-17: qty 1

## 2023-09-17 MED ORDER — LEVOTHYROXINE SODIUM 88 MCG PO TABS
88.0000 ug | ORAL_TABLET | Freq: Every day | ORAL | Status: DC
  Administered 2023-09-18: 88 ug via ORAL
  Filled 2023-09-17: qty 1

## 2023-09-17 MED ORDER — LORATADINE 10 MG PO TABS
10.0000 mg | ORAL_TABLET | Freq: Every day | ORAL | Status: DC
  Administered 2023-09-18: 10 mg via ORAL
  Filled 2023-09-17: qty 1

## 2023-09-17 MED ORDER — POTASSIUM CHLORIDE CRYS ER 20 MEQ PO TBCR
40.0000 meq | EXTENDED_RELEASE_TABLET | Freq: Once | ORAL | Status: AC
  Administered 2023-09-17: 40 meq via ORAL
  Filled 2023-09-17: qty 2

## 2023-09-17 MED ORDER — IRBESARTAN 150 MG PO TABS
300.0000 mg | ORAL_TABLET | Freq: Every day | ORAL | Status: DC
Start: 2023-09-18 — End: 2023-09-18
  Administered 2023-09-18: 300 mg via ORAL
  Filled 2023-09-17: qty 2

## 2023-09-17 MED ORDER — ROSUVASTATIN CALCIUM 10 MG PO TABS
10.0000 mg | ORAL_TABLET | Freq: Every day | ORAL | Status: DC
  Administered 2023-09-18: 10 mg via ORAL
  Filled 2023-09-17: qty 1

## 2023-09-17 NOTE — ED Provider Notes (Signed)
 [facility] [facility] DE[facility]MENT AT [facility] Provider Note   CSN: [id number] Arrival date & time: 09/17/23  1523     History  Chief Complaint  Patient presents with   Allergic Reaction    Logan [name] is a 73 y.o. male.  Patient is a 73 year old male who presents after getting stung by about 13 bees.  He has a history of hypertension, diabetes, hearing loss, coronary artery disease.  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  When he started to reach up toward them, he noticed that they were angry and started stinging him.  He said he got stung about 10-15 times.  He started to drive home.  When he got home, he started noticing that he had some swelling and numbness to his lower lip.  By the time he got into the house, he was sweaty all over and felt like he was going to pass out.  He called 911.  When EMS got there, he was noted to be hypotensive with hives.  He got to IM epinephrine injections as well as Benadryl and 500 cc of normal saline.  He says he feels much better now.  He denies that he ever had any chest pain or tightness.  No shortness of breath.  He denies any swelling of his lips or tongue currently.  He denies any rash but per EMS, he had hives.       Home Medications Prior to Admission medications   Medication Sig Start Date End Date Taking? Authorizing Provider  amLODipine (NORVASC) 10 MG tablet TAKE 1 TABLET(10 MG) BY MOUTH DAILY 07/01/23   Arcadio Knuckles, MD  aspirin 81 MG tablet Take 81 mg by mouth 2 (two) times daily.    [provider]  Continuous Blood Gluc Receiver (FREESTYLE LIBRE 14 DAY READER) DEVI     [provider]  Continuous Blood Gluc Sensor (FREESTYLE LIBRE 14 DAY SENSOR) MISC     [provider]  hydrALAZINE (APRESOLINE) 10 MG tablet Take 1 tablet (10 mg total) by mouth in the morning and at bedtime. 07/04/23   Elmyra Haggard, MD  insulin degludec (TRESIBA FLEXTOUCH) 200 UNIT/ML FlexTouch Pen Inject 20  Units into the skin daily. 08/25/23   Arcadio Knuckles, MD  insulin NPH-regular Human (NOVOLIN 70/30) (70-30) 100 UNIT/ML injection Inject 20 Units into the skin 2 (two) times daily with a meal. 07/13/22   Arcadio Knuckles, MD  irbesartan (AVAPRO) 300 MG tablet TAKE 1 TABLET BY MOUTH EVERY DAY 07/01/23   Arcadio Knuckles, MD  levothyroxine (SYNTHROID) 88 MCG tablet Take 1 tablet (88 mcg total) by mouth daily before breakfast. 08/25/23   Arcadio Knuckles, MD  rosuvastatin (CRESTOR) 10 MG tablet Take 1 tablet (10 mg total) by mouth daily. 07/04/23   Elmyra Haggard, MD  triamterene-hydrochlorothiazide (MAXZIDE-25) 37.5-25 MG tablet Take 1 tablet by mouth daily. 07/04/23   Elmyra Haggard, MD      Allergies    Patient has no known allergies.    Review of Systems   Review of Systems  Constitutional:  Positive for fatigue. Negative for chills, diaphoresis and fever.  HENT:  Negative for congestion, rhinorrhea and sneezing.   Eyes: Negative.   Respiratory:  Negative for cough, chest tightness and shortness of breath.   Cardiovascular:  Negative for chest pain and leg swelling.  Gastrointestinal:  Negative for abdominal pain, blood in stool, diarrhea, nausea and vomiting.  Genitourinary:  Negative for difficulty urinating, flank pain, frequency and hematuria.  Musculoskeletal:  Negative for arthralgias and back pain.  Skin:  Positive for rash.  Neurological:  Positive for light-headedness. Negative for dizziness, speech difficulty, weakness, numbness and headaches.    Physical Exam Updated Vital Signs BP (!) 162/78   Pulse 87   Temp 98.3 F (36.8 C) (Oral)   Resp (!) 22   Ht 6\' 2"  (1.88 m)   Wt 78.7 kg   SpO2 99%   BMI 22.28 kg/m  Physical Exam Constitutional:      Appearance: He is well-developed.  HENT:     Head: Normocephalic and atraumatic.     Mouth/Throat:     Comments: No angioedema Eyes:     Pupils: Pupils are equal, round, and reactive to light.  Cardiovascular:     Rate and Rhythm:  Normal rate and regular rhythm.     Heart sounds: Normal heart sounds.  Pulmonary:     Effort: Pulmonary effort is normal. No respiratory distress.     Breath sounds: Normal breath sounds. No wheezing or rales.  Chest:     Chest wall: No tenderness.  Abdominal:     General: Bowel sounds are normal.     Palpations: Abdomen is soft.     Tenderness: There is no abdominal tenderness. There is no guarding or rebound.  Musculoskeletal:        General: Normal range of motion.     Cervical back: Normal range of motion and neck supple.     Comments: Multiple sting marks on the forearms bilaterally.  Lymphadenopathy:     Cervical: No cervical adenopathy.  Skin:    General: Skin is warm and dry.     Findings: No rash.  Neurological:     Mental Status: He is alert and oriented to person, place, and time.     ED Results / Procedures / Treatments   Labs (all labs ordered are listed, but only abnormal results are displayed) Labs Reviewed  BASIC METABOLIC PANEL WITH GFR - Abnormal; Notable for the following components:      Result Value   Potassium 2.9 (*)    Glucose, Bld 167 (*)    Creatinine, Ser 1.34 (*)    Calcium 8.6 (*)    GFR, Estimated 56 (*)    All other components within normal limits  CBC WITH DIFFERENTIAL/PLATELET - Abnormal; Notable for the following components:   WBC 12.3 (*)    All other components within normal limits  TROPONIN I (HIGH SENSITIVITY) - Abnormal; Notable for the following components:   Troponin I (High Sensitivity) 112 (*)    All other components within normal limits  TROPONIN I (HIGH SENSITIVITY)    EKG EKG Interpretation Date/Time:  Sunday September 17 2023 15:37:06 EDT Ventricular Rate:  91 PR Interval:  189 QRS Duration:  84 QT Interval:  360 QTC Calculation: 443 R Axis:   -54  Text Interpretation: Sinus rhythm Abnormal R-wave progression, late transition Inferior infarct, acute (RCA) Probable RV involvement, suggest recording right precordial  leads Confirmed by Hershel Los [phone]) on 09/17/2023 3:41:21 PM  Radiology No results found.  Procedures Procedures    Medications Ordered in ED Medications  aspirin chewable tablet 324 mg (has no administration in time range)  dexamethasone (DECADRON) injection 10 mg (10 mg Intravenous Given 09/17/23 1615)  potassium chloride SA (KLOR-CON M) CR tablet 40 mEq (40 mEq Oral Given 09/17/23 1720)    ED Course/ Medical Decision Making/  A&P                                 Medical Decision Making Amount and/or Complexity of Data Reviewed Labs: ordered.  Risk OTC drugs. Prescription drug management. Decision regarding hospitalization.   Patient is a 73 year old who presents after he had a anaphylactic reaction to bee stings.  He was given 2 injections of epinephrine by EMS as well as Benadryl and IV fluids.  He was normotensive on arrival and his symptoms had mostly resolved.  His EKG initially showed some ST elevations inferiorly.  However he was not complaining of any chest pain or shortness of breath.  However repeat 1 short time later showed the segments had normalized.  His first troponin was normal.  However second troponin was 112.  I consulted with Dr. Donna Fus with cardiology.  She recommends to admit the patient to the medicine service for echocardiogram and trending the troponins.  She advises to hold off on heparin for now.  He was given dose of aspirin.  I spoke with Dr. Segars she will admit the patient for further treatment.  CRITICAL CARE Performed by: Hershel Los Total critical care time: 70 minutes Critical care time was exclusive of separately billable procedures and treating other patients. Critical care was necessary to treat or prevent imminent or life-threatening deterioration. Critical care was time spent personally by me on the following activities: development of treatment plan with patient and/or surrogate as well as nursing, discussions with consultants,  evaluation of patient's response to treatment, examination of patient, obtaining history from patient or surrogate, ordering and performing treatments and interventions, ordering and review of laboratory studies, ordering and review of radiographic studies, pulse oximetry and re-evaluation of patient's condition.   Final Clinical Impression(s) / ED Diagnoses Final diagnoses:  Anaphylaxis, initial encounter  Bee sting, accidental or unintentional, initial encounter  NSTEMI (non-ST elevated myocardial infarction) [facility])    Rx / DC Orders ED Discharge Orders     None         Hershel Los, MD 09/17/23 2048

## 2023-09-17 NOTE — Plan of Care (Signed)

## 2023-09-17 NOTE — ED Triage Notes (Addendum)
 Pt BIB EMS with reports of an allergic reaction. Pt was stung by 13+ bees. Pt given 0.6 I'm epi, 50 mg benadryl PO, 500 ml ns. 18 g iv left ac. Pt has stings to both arms. Initial symptoms hypotension and hives.

## 2023-09-17 NOTE — H&P (Incomplete)
 History and Physical    Logan [name]:[id number] DOB: May 18, 1951 DOA: 09/17/2023  PCP: Logan Knuckles, MD   Patient coming from: Home   Chief Complaint:  Chief Complaint  Patient presents with   Allergic Reaction    HPI:  Logan [name] is a 73 y.o. 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 He was moving his forearm and got stung approximately 10-15 times.  He developed swelling and tingling on his lips and felt lightheaded. See additional EMS course below, on their arrival had hives and was hypotensive.  Otherwise denies any chest pain, shortness of breath, orthopnea, lower extremity edema.  Otherwise he has had no recent illness.  Review of Systems:  ROS complete and negative except as marked above   No Known Allergies  Prior to Admission medications   Medication Sig Start Date End Date Taking? Authorizing Provider  amLODipine (NORVASC) 10 MG tablet TAKE 1 TABLET(10 MG) BY MOUTH DAILY 07/01/23  Yes Logan Knuckles, MD  aspirin 81 MG tablet Take 81 mg by mouth 2 (two) times daily.   Yes [provider]  Continuous Blood Gluc Receiver (FREESTYLE LIBRE 14 DAY READER) DEVI    Yes [provider]  Continuous Blood Gluc Sensor (FREESTYLE LIBRE 14 DAY SENSOR) MISC    Yes [provider]  hydrALAZINE (APRESOLINE) 10 MG tablet Take 1 tablet (10 mg total) by mouth in the morning and at bedtime. 07/04/23  Yes Elmyra Haggard, MD  insulin degludec (TRESIBA FLEXTOUCH) 200 UNIT/ML FlexTouch Pen Inject 20 Units into the skin daily. 08/25/23  Yes Logan Knuckles, MD  insulin NPH-regular Human (NOVOLIN 70/30) (70-30) 100 UNIT/ML injection Inject 20 Units into the skin 2 (two) times daily with a meal. 07/13/22  Yes Logan Knuckles, MD  irbesartan (AVAPRO) 300 MG tablet TAKE 1 TABLET BY MOUTH EVERY DAY  07/01/23  Yes Logan Knuckles, MD  levothyroxine (SYNTHROID) 88 MCG tablet Take 1 tablet (88 mcg total) by mouth daily before breakfast. 08/25/23  Yes Logan Knuckles, MD  rosuvastatin (CRESTOR) 10 MG tablet Take 1 tablet (10 mg total) by mouth daily. 07/04/23  Yes Elmyra Haggard, MD  triamterene-hydrochlorothiazide (MAXZIDE-25) 37.5-25 MG tablet Take 1 tablet by mouth daily. 07/04/23  Yes Elmyra Haggard, MD    Past Medical History:  Diagnosis Date   Aortic atherosclerosis [facility])    Cataract    Coronary artery disease involving native coronary artery of native heart without angina pectoris 11/01/2007   Last Cath '04: LAD w/ 60% long lesion; 1st Dx 70% ostial; 2nd Dx  70% ostial; Cx 30% ostial; RCA-dominant multiple 30-40% lesions; PDA 30% lesions  Last nuclear stress March '09 - negative for ischemia    Diabetes mellitus    Diabetic retinopathy    Hearing loss    Hemochromatosis    Hyperlipidemia    Hypertension    Tubular adenoma of colon     Past Surgical History:  Procedure Laterality Date   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 He has never used smokeless tobacco. He reports current  alcohol use. He reports that he does not use drugs.  Family History  Problem Relation Age of Onset   Asthma Mother    Heart failure Mother    Emphysema Mother    Diabetes Father    Alcohol abuse Father    Cancer Father        prostate   Stroke Father    Diabetes Sister    Diabetes Brother    Colon cancer Maternal Uncle        dx'd 55   Blindness Paternal Grandmother    Diabetes Paternal Grandmother    Heart disease Neg Hx    Rectal cancer Neg Hx    Stomach cancer Neg Hx      Physical Exam: Vitals:   09/17/23 2000 09/17/23 2015 09/17/23 2030 09/17/23 2126  BP: (!) 191/95  (!) 165/101 (!) 182/90 (!) 166/83  Pulse: 91 95 91 97  Resp: (!) 22 17 11 18   Temp:    98.1 F (36.7 C)  TempSrc:      SpO2: 96% 95% 96% 97%  Weight:      Height:        Gen: Awake, alert, NAD ***  CV: Regular, normal S1, S2, no murmurs  Resp: Normal WOB, CTAB  Abd: Flat, normoactive, nontender MSK: Symmetric, no edema  Skin: No rashes or lesions to exposed skin  Neuro: Alert and interactive  Psych: euthymic, appropriate    Data review:   Labs reviewed, notable for:   High-sensitivity troponin 7 -> 112 K2.9 Creatinine 1.3 WBC 12  Micro:  Results for orders placed or performed in visit on 07/15/21  Urine Culture     Status: None   Collection Time: 07/15/21 11:08 AM   Specimen: Urine  Result Value Ref Range Status   Source: NOT GIVEN  Final   Status: FINAL  Final   Result:   Final    Less than 10,000 CFU/mL of single Gram positive organism isolated. No further testing will be performed. If clinically indicated, recollection using a method to minimize contamination, with prompt transfer to Urine Culture Transport Tube, is recommended.    Imaging reviewed:  No results found.  EKG:  Personally reviewed, sinus rhythm, LAD, there is ST depression and T wave inversion anteriorly, PR depression accentuating the ST elevation inferiorly, approximately 0.5 mm in II, 1 mm in III, 0.5 mm in aVF  ED Course:  On EMS arrival patient was hypotensive biopsy was treated with IM epinephrine x 2, Benadryl, 500 cc of.  In the ED also treated with dexamethasone 10 mg.  Due to myocardial injury cardiology fellow was consulted by EDP who recommends for observation and echo in the morning, no need for heparin at this time.  Was loaded with aspirin 324 mg in the ED.   Assessment/Plan:  73 y.o. 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 Found to have NSTEMI  Anaphylaxis related to bee sting Beekeeper stung 10-15 times, hypotensive with urticaria on EMS arrival.  Treated with IM epinephrine x 2, Benadryl, 500 cc IV fluid and dexamethasone.  At time of ED arrival vitals had normalized and remains hypertensive, no airway concern.  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  Noted to have ischemic EKG changes with ST depression T wave inversion anteriorly and ST elevation inferiorly.  High-sensitivity troponin 7 -> 112.  No history of chest pain.  Suspect this is a demand event in the setting of global ischemia from hypotension and anaphylaxis. - Cardiology fellow Dr. Donna Fus was consulted by EDP who recommends for observation and echo in the morning, no need for heparin at this time.  Please contact cardiology team for consult in the morning -S/p aspirin 324 mg x 1.  Continue aspirin 81 mg BID per home regimen.  - Continue rosuvastatin 10 mg daily - Check lipids and A1c - TTE  Leukocytosis Likely reactive in setting of bee stings  Hypokalemia Repleted  Chronic medical problems: Diabetes type 1: Home regimen Toujeo 8 to 20 units in the evening, NPH 20 units twice daily. Inpatient use Semglee 8 units in the evening, switch NPH to 70/30 with reduced dose of 10 units twice daily, addition of SSI for moderate.  Hypertension: Recently hypotensive with anaphylaxis.  Resume his home antihypertensives as this is resolved with hydralazine 10 mg twice daily, amlodipine 10 mg daily, irbesartan 300 mg daily, triamterene-HCTZ 37-25 mg daily Hyperlipidemia: Continue home statin CKD stage II: Baseline creatinine near 1.1   Body mass index is 22.28  kg/m.    DVT prophylaxis:  Lovenox Code Status:  Full Code Diet:  Diet Orders (From admission, onward)    None      Family Communication:  No   Consults:  Cardiology   Admission status:   Observation, Telemetry bed  Severity of Illness: The appropriate patient status for this patient is OBSERVATION. Observation status is judged to be reasonable and necessary in order to provide the required intensity of service to ensure the patient's safety. The patient's presenting symptoms, physical exam findings, and initial radiographic and laboratory data in the context of their medical condition is felt to place them at decreased risk for further clinical deterioration. Furthermore, it is anticipated that the patient will be medically stable for discharge from the hospital within 2 midnights of admission.    Arnulfo Larch, MD [facility]  How to contact the [facility] Attending or Consulting provider 7A - 7P or covering provider during after hours 7P -7A, for this patient.  Check the care team in [facility] and look for a) attending/consulting [facility] provider listed and b) the [facility] team listed Log into [email or web address] and use [place]'s universal password to access. If you do not have the password, please contact the hospital operator. Locate the [facility] provider you are looking for under [facility] and page to a number that you can be directly reached. If you still have difficulty reaching the provider, please page the [facility] (Director on Call) for the Hospitalists listed on amion for assistance.  09/17/2023, 10:15 PM

## 2023-09-18 ENCOUNTER — Observation Stay (HOSPITAL_COMMUNITY)

## 2023-09-18 DIAGNOSIS — E1022 Type 1 diabetes mellitus with diabetic chronic kidney disease: Secondary | ICD-10-CM | POA: Diagnosis not present

## 2023-09-18 DIAGNOSIS — T782XXA Anaphylactic shock, unspecified, initial encounter: Secondary | ICD-10-CM | POA: Diagnosis not present

## 2023-09-18 DIAGNOSIS — I10 Essential (primary) hypertension: Secondary | ICD-10-CM | POA: Diagnosis not present

## 2023-09-18 DIAGNOSIS — Z7901 Long term (current) use of anticoagulants: Secondary | ICD-10-CM | POA: Diagnosis not present

## 2023-09-18 DIAGNOSIS — D72829 Elevated white blood cell count, unspecified: Secondary | ICD-10-CM | POA: Diagnosis not present

## 2023-09-18 DIAGNOSIS — I2510 Atherosclerotic heart disease of native coronary artery without angina pectoris: Secondary | ICD-10-CM | POA: Diagnosis not present

## 2023-09-18 DIAGNOSIS — I5A Non-ischemic myocardial injury (non-traumatic): Secondary | ICD-10-CM | POA: Diagnosis not present

## 2023-09-18 DIAGNOSIS — E785 Hyperlipidemia, unspecified: Secondary | ICD-10-CM

## 2023-09-18 DIAGNOSIS — I214 Non-ST elevation (NSTEMI) myocardial infarction: Secondary | ICD-10-CM

## 2023-09-18 DIAGNOSIS — Z79899 Other long term (current) drug therapy: Secondary | ICD-10-CM | POA: Diagnosis not present

## 2023-09-18 DIAGNOSIS — E876 Hypokalemia: Secondary | ICD-10-CM | POA: Diagnosis not present

## 2023-09-18 DIAGNOSIS — R7989 Other specified abnormal findings of blood chemistry: Secondary | ICD-10-CM

## 2023-09-18 DIAGNOSIS — F1090 Alcohol use, unspecified, uncomplicated: Secondary | ICD-10-CM | POA: Diagnosis not present

## 2023-09-18 DIAGNOSIS — T63441A Toxic effect of venom of bees, accidental (unintentional), initial encounter: Secondary | ICD-10-CM | POA: Diagnosis not present

## 2023-09-18 DIAGNOSIS — N182 Chronic kidney disease, stage 2 (mild): Secondary | ICD-10-CM | POA: Diagnosis not present

## 2023-09-18 DIAGNOSIS — I129 Hypertensive chronic kidney disease with stage 1 through stage 4 chronic kidney disease, or unspecified chronic kidney disease: Secondary | ICD-10-CM | POA: Diagnosis not present

## 2023-09-18 LAB — ECHOCARDIOGRAM COMPLETE
AR max vel: 2.06 cm2
AV Area VTI: 2.07 cm2
AV Area mean vel: 2.01 cm2
AV Mean grad: 7 mmHg
AV Peak grad: 11.7 mmHg
Ao pk vel: 1.71 m/s
Area-P 1/2: 4.41 cm2
Height: 74 in
S' Lateral: 3.7 cm
Weight: 2776.03 [oz_av]

## 2023-09-18 LAB — TROPONIN I (HIGH SENSITIVITY)
Troponin I (High Sensitivity): 1531 ng/L (ref <18)
Troponin I (High Sensitivity): 3567 ng/L (ref <18)

## 2023-09-18 LAB — GLUCOSE, CAPILLARY
Glucose-Capillary: 202 mg/dL — ABNORMAL HIGH (ref 70–99)
Glucose-Capillary: 190 mg/dL — ABNORMAL HIGH (ref 70–99)

## 2023-09-18 LAB — LIPID PANEL
Cholesterol: 98 mg/dL (ref 0–200)
HDL: 54 mg/dL (ref >40)
LDL Cholesterol: 40 mg/dL (ref 0–99)
Total CHOL/HDL Ratio: 1.8 ratio
Triglycerides: 22 mg/dL (ref <150)
VLDL: 4 mg/dL (ref 0–40)

## 2023-09-18 LAB — BASIC METABOLIC PANEL WITH GFR
Anion gap: 9 (ref 5–15)
BUN: 21 mg/dL (ref 8–23)
CO2: 23 mmol/L (ref 22–32)
Calcium: 8.8 mg/dL — ABNORMAL LOW (ref 8.9–10.3)
Chloride: 106 mmol/L (ref 98–111)
Creatinine, Ser: 1.2 mg/dL (ref 0.61–1.24)
GFR, Estimated: >60 mL/min (ref >60)
Glucose, Bld: 233 mg/dL — ABNORMAL HIGH (ref 70–99)
Potassium: 4.7 mmol/L (ref 3.5–5.1)
Sodium: 138 mmol/L (ref 135–145)

## 2023-09-18 MED ORDER — HEPARIN (PORCINE) 25000 UT/250ML-% IV SOLN
950.0000 [IU]/h | INTRAVENOUS | Status: DC
  Administered 2023-09-18: 950 [IU]/h via INTRAVENOUS
  Filled 2023-09-18: qty 250

## 2023-09-18 MED ORDER — FAMOTIDINE 20 MG PO TABS: 20.0000 mg | ORAL_TABLET | Freq: Every day | ORAL | 0 refills | Status: DC | PRN

## 2023-09-18 MED ORDER — LORATADINE 10 MG PO TABS: 10.0000 mg | ORAL_TABLET | Freq: Every day | ORAL | 0 refills | Status: DC | PRN

## 2023-09-18 MED ORDER — HYDRALAZINE HCL 10 MG PO TABS: 10.0000 mg | ORAL_TABLET | Freq: Three times a day (TID) | ORAL | 3 refills | Status: AC

## 2023-09-18 MED ORDER — SPIRONOLACTONE 25 MG PO TABS
12.5000 mg | ORAL_TABLET | Freq: Every day | ORAL | 0 refills | Status: DC
Start: 2023-09-18 — End: 2023-10-18

## 2023-09-18 MED ORDER — EPINEPHRINE 0.3 MG/0.3ML IJ SOAJ: 0.3000 mg | INTRAMUSCULAR | 1 refills | Status: DC | PRN

## 2023-09-18 MED ORDER — HEPARIN BOLUS VIA INFUSION
4000.0000 [IU] | Freq: Once | INTRAVENOUS | Status: AC
  Administered 2023-09-18: 4000 [IU] via INTRAVENOUS
  Filled 2023-09-18: qty 4000

## 2023-09-18 NOTE — Progress Notes (Signed)
 Nursing Discharge Note   Name: Logan [name] MRN: [id number] DOB: 09/04/1950    Admit Date:  09/17/2023  Discharge Date:  09/18/2023   Logan [name] is to be discharged home per MD order.  AVS completed. Reviewed with patient and family at bedside. Highlighted copy provided for patient to take home.  Patient able to verbalize understanding of discharge instructions. PIV removed. Patient stable upon discharge.   Discharge Instructions   None      Discharge Instructions     Ambulatory referral to Allergy   Complete by: As directed    Anaphylaxis to bee stings, patient is a Pension scheme manager   Call MD for:  difficulty breathing, headache or visual disturbances   Complete by: As directed    Call MD for:  extreme fatigue   Complete by: As directed    Call MD for:  persistant dizziness or light-headedness   Complete by: As directed    Diet general   Complete by: As directed    Discharge instructions   Complete by: As directed    1. You have been treated for anaphylaxis related to bee stings; avoid further stings if at all possible 2. Epipen provided for as-needed use 3. Continue loratadine and famotidine as needed for allergic symptoms 4. Referral made to allergy/immunology 5. Stop Logan [name], continue NPH 70/30 20 units twice daily 6. Increase hydralazine to 3 times daily 7. Add spironolactone for blood pressure control 8. Follow up with Logan [name] in 1-2 weeks 9. Follow up with cardiology in about a month   Increase activity slowly   Complete by: As directed         Allergies as of 09/18/2023   No Known Allergies      Medication List     STOP taking these medications    Tresiba FlexTouch 200 UNIT/ML FlexTouch Pen Generic drug: insulin degludec       TAKE these medications    amLODipine 10 MG tablet Commonly known as: NORVASC TAKE 1 TABLET(10 MG) BY MOUTH DAILY   aspirin 81 MG tablet Take 81 mg by mouth 2 (two) times daily.   EPINEPHrine 0.3 mg/0.3 mL Soaj  injection Commonly known as: EpiPen 2-Pak Inject 0.3 mg into the muscle as needed for anaphylaxis.   famotidine 20 MG tablet Commonly known as: PEPCID Take 1 tablet (20 mg total) by mouth daily as needed (allergy symptoms).   FreeStyle Libre 14 Day Reader Hardie Pulley   FreeStyle Libre 14 Day Sensor Misc   hydrALAZINE 10 MG tablet Commonly known as: APRESOLINE Take 1 tablet (10 mg total) by mouth 3 (three) times daily. What changed: when to take this   irbesartan 300 MG tablet Commonly known as: AVAPRO TAKE 1 TABLET BY MOUTH EVERY DAY   levothyroxine 88 MCG tablet Commonly known as: SYNTHROID Take 1 tablet (88 mcg total) by mouth daily before breakfast.   loratadine 10 MG tablet Commonly known as: CLARITIN Take 1 tablet (10 mg total) by mouth daily as needed for allergies.   NovoLIN 70/30 (70-30) 100 UNIT/ML injection Generic drug: insulin NPH-regular Human Inject 20 Units into the skin 2 (two) times daily with a meal.   rosuvastatin 10 MG tablet Commonly known as: CRESTOR Take 1 tablet (10 mg total) by mouth daily.   spironolactone 25 MG tablet Commonly known as: Aldactone Take 0.5 tablets (12.5 mg total) by mouth daily.   triamterene-hydrochlorothiazide 37.5-25 MG tablet Commonly known as: MAXZIDE-25 Take 1 tablet by mouth daily.  Discharge Instructions/ Education: An After Visit Summary was printed and given to the patient. Discharge instructions given to patient/family with verbalized understanding. Discharge education completed with patient/family including: follow up instructions, medication list, discharge activities, and limitations if indicated.  Additional discharge instructions as indicated by discharging provider also reviewed.  Patient and family able to verbalize understanding, all questions fully answered. Patient instructed to return to Emergency Department, call 911, or call MD for any changes in condition.   Patient ambulatory to lobby for  discharge home. Declined staff escort to lobby. Walked to lobby with son for discharge home.

## 2023-09-18 NOTE — Hospital Course (Signed)
 72yo with h/o CAD s/p stent, DM, HTN, HLD, and hemachromatosis who does beekeeping and presented on 4/13 after multiple bee stings causing anaphylaxis.  He was given Epi, Benadryl, and dexamethasone with improvement.  However, he also had uptending troponin and was started on heparin; cardiology will consult.

## 2023-09-18 NOTE — Discharge Summary (Signed)
 Physician Discharge Summary   Patient: Logan [name] MRN: [id number] DOB: Sep 26, 1950  Admit date:     09/17/2023  Discharge date: 09/18/23  Discharge Physician: Lorita Rosa   PCP: Arcadio Knuckles, MD   Recommendations at discharge:   You have been treated for anaphylaxis related to bee stings; avoid further stings if at all possible Epipen provided for as-needed use Continue loratadine and famotidine as needed for allergic symptoms Referral made to allergy/immunology Stop Horace Lye, continue NPH 70/30 20 units twice daily Increase hydralazine to 3 times daily Add spironolactone for blood pressure control Follow up with Dr. Rochelle Chu in 1-2 weeks Follow up with cardiology in about a month  Discharge Diagnoses: Principal Problem:   Myocardial injury Active Problems:   Anaphylaxis   Bee sting   Hospital Course: 73yo with h/o CAD s/p stent, DM, HTN, HLD, and hemachromatosis who does beekeeping and presented on 4/13 after multiple bee stings causing anaphylaxis.  73yo was given Epi, Benadryl, and dexamethasone with improvement.  However, he also had uptending troponin and was started on heparin; cardiology will consult.  Assessment and Plan:  Anaphylaxis related to bee sting Beekeeper stung 10-15 times, hypotensive with urticaria on EMS arrival Treated with IM epinephrine x 2, Benadryl, 500 cc IV fluid and dexamethasone At time of ED arrival vitals had normalized and remains hypertensive, no airway concern He is now convinced that this was related to a combination of bee venom and HTN medications leading to hypotension rather than anaphylaxis Will give EpiPen at time of discharge - although he is concerned that this is what led to his NSTEMI Will need referral to allergy/immunology to consider venom immunotherapy 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 Donna Fus was consulted by EDP who recommended observation and echo in the morning S/p aspirin 324 mg x 1.  Continue aspirin 81 mg BID per home regimen Continue rosuvastatin 10 mg daily Lipids well controlled Treated overnight with heparin TTE with preserved EF, grade 1 diastolic dysfunction, no WMA Ok to dc per cardiology, does not need ischemic testing at this time   Diabetes type 1 Resume home regimen of NPH 20 units twice daily  Stop Toujeo - should not need 2 long-acting insulin formularies  Hypertension Recently hypotensive with anaphylaxis Resume his home antihypertensives as this is resolved with hydralazine 10 mg twice daily (increase to TID), amlodipine 10 mg daily, irbesartan 300 mg daily, triamterene-HCTZ 37-25 mg daily Add spironolactone 12.5 mg daily  Hyperlipidemia Continue home statin Lipids: 98/54/40/22  CKD stage II  Baseline creatinine near 1.1         Consultants: Cardiology   Procedures: None   Antibiotics: None   Pain control - [place]  Controlled Substance Reporting System database was reviewed. and patient was instructed, not to drive, operate heavy machinery, perform activities at heights, swimming or participation in water activities or provide baby-sitting services while on Pain, Sleep and Anxiety Medications; until their outpatient Physician has advised to do so again. Also recommended to not to take  more than prescribed Pain, Sleep and Anxiety Medications.   Disposition: Home Diet recommendation:  Regular diet DISCHARGE MEDICATION: Allergies as of 09/18/2023   No Known Allergies       Medication List     STOP taking these medications    Tresiba FlexTouch 200 UNIT/ML FlexTouch Pen Generic drug: insulin degludec       TAKE these medications    amLODipine 10 MG tablet Commonly known as: NORVASC TAKE 1 TABLET(10 MG) BY MOUTH DAILY   aspirin 81 MG tablet Take 81 mg by mouth 2 (two) times daily.   EPINEPHrine 0.3 mg/0.3 mL Soaj injection Commonly known as: EpiPen 2-Pak Inject 0.3 mg into the muscle as needed for anaphylaxis.   famotidine 20 MG tablet Commonly known as: PEPCID Take 1 tablet (20 mg total) by mouth daily as needed (allergy symptoms).   FreeStyle Libre 14 Day Reader Seymour Dapper   FreeStyle Libre 14 Day Sensor Misc   hydrALAZINE 10 MG tablet Commonly known as: APRESOLINE Take 1 tablet (10 mg total) by mouth 3 (three) times daily. What changed: when to take this   irbesartan 300 MG tablet Commonly known as: AVAPRO TAKE 1 TABLET BY MOUTH EVERY DAY   levothyroxine 88 MCG tablet Commonly known as: SYNTHROID Take 1 tablet (88 mcg total) by mouth daily before breakfast.   loratadine 10 MG tablet Commonly known as: CLARITIN Take 1 tablet (10 mg total) by mouth daily as needed for allergies.   NovoLIN 70/30 (70-30) 100 UNIT/ML injection Generic drug: insulin NPH-regular Human Inject 20 Units into the skin 2 (two) times daily with a meal.   rosuvastatin 10 MG tablet Commonly known as: CRESTOR Take 1 tablet (10 mg total) by mouth daily.   spironolactone 25 MG tablet Commonly known as: Aldactone Take 0.5 tablets (12.5 mg total) by mouth daily.   triamterene-hydrochlorothiazide 37.5-25 MG tablet Commonly known as: MAXZIDE-25 Take 1 tablet by mouth daily.        Discharge Exam:   Subjective: Feels great, very eager to go home with his cat.   Objective: Vitals:   09/18/23 0951 09/18/23 1153  BP: (!) 154/75 (!) 171/109  Pulse:  95  Resp:  19  Temp:    SpO2:  98%    Intake/Output Summary (Last 24 hours) at 09/18/2023 1244 Last  data filed at 09/18/2023 1200 Gross per 24 hour  Intake 307.52 ml  Output --  Net 307.52 ml   Filed Weights   09/17/23 1532  Weight: 78.7 kg    Exam:  General:  Appears calm and comfortable and is in NAD Eyes:  EOMI, normal lids, iris ENT:  grossly normal hearing, lips & tongue, mmm Neck:  no LAD, masses or thyromegaly Cardiovascular:  RRR, no m/r/g. No LE edema.  Respiratory:   CTA bilaterally with no wheezes/rales/rhonchi.  Normal respiratory effort. Abdomen:  soft, NT, ND Skin:  no rash or induration seen on limited exam Musculoskeletal:  grossly normal tone BUE/BLE, good ROM, no bony abnormality Psychiatric:  eccentric mood and affect, speech fluent and tangential, AOx3 Neurologic:  CN 2-12 grossly intact, moves all extremities in coordinated fashion   Data Reviewed: I have reviewed the patient's lab results since admission.  Pertinent labs for today include:   HS troponin 7, 112, 1531, 3567     Condition at discharge: improving  The results of significant diagnostics from this hospitalization (including imaging, microbiology, ancillary and laboratory) are listed below for reference.   Imaging Studies: ECHOCARDIOGRAM COMPLETE Result Date: 09/18/2023  ECHOCARDIOGRAM REPORT   Patient Name:   JURGEN GROENEVELD Date of Exam: 09/18/2023 Medical Rec #:  [id number]      Height:       74.0 in Accession #:    [id number]     Weight:       173.5 lb Date of Birth:  05/23/1951      BSA:          2.046 m Patient Age:    72 years       BP:           155/73 mmHg Patient Gender: M              HR:           97 bpm. Exam Location:  Inpatient Procedure: 2D Echo, 3D Echo, Cardiac Doppler and Color Doppler (Both Spectral            and Color Flow Doppler were utilized during procedure). Indications:    Nstemi  History:        Patient has prior history of Echocardiogram examinations, most                 recent 12/03/2020. Risk Factors:Hypertension, Diabetes and                 Dyslipidemia.   Sonographer:    Reta Cassis Referring Phys: [id number] JONATHAN SEGARS IMPRESSIONS  1. Left ventricular ejection fraction, by estimation, is 65 to 70%. The left ventricle has normal function. The left ventricle has no regional wall motion abnormalities. Left ventricular diastolic parameters are consistent with Grade I diastolic dysfunction (impaired relaxation).  2. Right ventricular systolic function is normal. The right ventricular size is normal. There is mildly elevated pulmonary artery systolic pressure.  3. The mitral valve is normal in structure. Trivial mitral valve regurgitation. No evidence of mitral stenosis.  4. The aortic valve is tricuspid. There is mild calcification of the aortic valve. Aortic valve regurgitation is not visualized. Aortic valve sclerosis/calcification is present, without any evidence of aortic stenosis. Aortic valve area, by VTI measures  2.07 cm. Aortic valve mean gradient measures 7.0 mmHg. Aortic valve Vmax measures 1.71 m/s.  5. The inferior vena cava is normal in size with greater than 50% respiratory variability, suggesting right atrial pressure of 3 mmHg. FINDINGS  Left Ventricle: Left ventricular ejection fraction, by estimation, is 65 to 70%. The left ventricle has normal function. The left ventricle has no regional wall motion abnormalities. The left ventricular internal cavity size was normal in size. There is  no left ventricular hypertrophy. Left ventricular diastolic parameters are consistent with Grade I diastolic dysfunction (impaired relaxation). Right Ventricle: The right ventricular size is normal. No increase in right ventricular wall thickness. Right ventricular systolic function is normal. There is mildly elevated pulmonary artery systolic pressure. The tricuspid regurgitant velocity is 2.99  m/s, and with an assumed right atrial pressure of 3 mmHg, the estimated right ventricular systolic pressure is 38.8 mmHg. Left Atrium: Left atrial size was normal in size.  Right Atrium: Right atrial size was normal in size. Pericardium: There is no evidence of pericardial effusion. Mitral Valve: The mitral valve is normal in structure. Trivial mitral valve regurgitation. No evidence of mitral valve stenosis. Tricuspid Valve: The tricuspid valve is normal in structure. Tricuspid valve regurgitation is trivial. No evidence of tricuspid stenosis. Aortic Valve: The aortic valve is tricuspid. There is mild calcification of the aortic valve. Aortic valve regurgitation is not visualized. Aortic valve  sclerosis/calcification is present, without any evidence of aortic stenosis. Aortic valve mean gradient measures 7.0 mmHg. Aortic valve peak gradient measures 11.7 mmHg. Aortic valve area, by VTI measures 2.07 cm. Pulmonic Valve: The pulmonic valve was normal in structure. Pulmonic valve regurgitation is not visualized. No evidence of pulmonic stenosis. Aorta: The aortic root is normal in size and structure. Venous: The inferior vena cava is normal in size with greater than 50% respiratory variability, suggesting right atrial pressure of 3 mmHg. IAS/Shunts: No atrial level shunt detected by color flow Doppler. Additional Comments: 3D was performed not requiring image post processing on an independent workstation and was normal.  LEFT VENTRICLE PLAX 2D LVIDd:         5.30 cm   Diastology LVIDs:         3.70 cm   LV e' medial:    9.03 cm/s LV PW:         0.80 cm   LV E/e' medial:  10.5 LV IVS:        0.90 cm   LV e' lateral:   10.70 cm/s LVOT diam:     1.90 cm   LV E/e' lateral: 8.9 LV SV:         69 LV SV Index:   34 LVOT Area:     2.84 cm                           3D Volume EF:                          3D EF:        54 %                          LV EDV:       167 ml                          LV ESV:       77 ml                          LV SV:        90 ml RIGHT VENTRICLE             IVC RV Basal diam:  4.30 cm     IVC diam: 1.20 cm RV S prime:     15.30 cm/s TAPSE (M-mode): 3.0 cm LEFT ATRIUM              Index        RIGHT ATRIUM           Index LA diam:        3.40 cm 1.66 cm/m   RA Area:     19.90 cm LA Vol (A2C):   67.7 ml 33.09 ml/m  RA Volume:   61.70 ml  30.16 ml/m LA Vol (A4C):   44.2 ml 21.60 ml/m LA Biplane Vol: 54.9 ml 26.83 ml/m  AORTIC VALVE AV Area (Vmax):    2.06 cm AV Area (Vmean):   2.01 cm AV Area (VTI):     2.07 cm AV Vmax:           171.00 cm/s AV Vmean:          119.000 cm/s AV VTI:  0.331 m AV Peak Grad:      11.7 mmHg AV Mean Grad:      7.0 mmHg LVOT Vmax:         124.00 cm/s LVOT Vmean:        84.400 cm/s LVOT VTI:          0.242 m LVOT/AV VTI ratio: 0.73  AORTA Ao Root diam: 2.80 cm Ao Asc diam:  3.10 cm MITRAL VALVE                TRICUSPID VALVE MV Area (PHT): 4.41 cm     TR Peak grad:   35.8 mmHg MV Decel Time: 172 msec     TR Vmax:        299.00 cm/s MV E velocity: 95.10 cm/s MV A velocity: 117.00 cm/s  SHUNTS MV E/A ratio:  0.81         Systemic VTI:  0.24 m                             Systemic Diam: 1.90 cm Jules Oar MD Electronically signed by Jules Oar MD Signature Date/Time: 09/18/2023/12:16:43 PM    Final     Microbiology: Results for orders placed or performed in visit on 07/15/21  Urine Culture     Status: None   Collection Time: 07/15/21 11:08 AM   Specimen: Urine  Result Value Ref Range Status   Source: NOT GIVEN  Final   Status: FINAL  Final   Result:   Final    Less than 10,000 CFU/mL of single Gram positive organism isolated. No further testing will be performed. If clinically indicated, recollection using a method to minimize contamination, with prompt transfer to Urine Culture Transport Tube, is recommended.    Labs: CBC: Recent Labs  Lab 09/17/23 1535  WBC 12.3*  NEUTROABS 7.2  HGB 15.1  HCT 46.8  MCV 97.5  PLT 199   Basic Metabolic Panel: Recent Labs  Lab 09/17/23 1535 09/18/23 0535  NA 141 138  K 2.9* 4.7  CL 106 106  CO2 24 23  GLUCOSE 167* 233*  BUN 16 21  CREATININE 1.34* 1.20  CALCIUM  8.6* 8.8*   Liver Function Tests: No results for input(s): "AST", "ALT", "ALKPHOS", "BILITOT", "PROT", "ALBUMIN" in the last 168 hours. CBG: Recent Labs  Lab 09/17/23 2222 09/18/23 0749 09/18/23 1151  GLUCAP 192* 202* 190*    Discharge time spent: greater than 30 minutes.  Signed: Lorita Rosa, MD [facility] 09/18/2023

## 2023-09-18 NOTE — Progress Notes (Signed)
  Echocardiogram 2D Echocardiogram has been performed.  Logan [name] Logan [name] 09/18/2023, 11:56 AM

## 2023-09-18 NOTE — Progress Notes (Signed)
 PHARMACY - ANTICOAGULATION CONSULT NOTE  Pharmacy Consult for heparin Indication: ACS  No Known Allergies  Patient Measurements: Height: 6\' 2"  (188 cm) Weight: 78.7 kg (173 lb 8 oz) IBW/kg (Calculated) : 82.2 HEPARIN DW (KG): 78.7  Vital Signs: Temp: 98.4 F (36.9 C) (04/14 0505) Temp Source: Oral (04/13 1922) BP: 155/73 (04/14 0505) Pulse Rate: 72 (04/14 0505)  Labs: Recent Labs    09/17/23 1535 09/17/23 1821 09/18/23 0005  HGB 15.1  --   --   HCT 46.8  --   --   PLT 199  --   --   CREATININE 1.34*  --   --   TROPONINIHS 7 112* 1,531*    Estimated Creatinine Clearance: 55.5 mL/min (A) (by C-G formula based on SCr of 1.34 mg/dL (H)).   Medical History: Past Medical History:  Diagnosis Date   Aortic atherosclerosis (HCC)    Cataract    Coronary artery disease involving native coronary artery of native heart without angina pectoris 11/01/2007   Last Cath '04: LAD w/ 60% long lesion; 1st Dx 70% ostial; 2nd Dx  70% ostial; Cx 30% ostial; RCA-dominant multiple 30-40% lesions; PDA 30% lesions  Last nuclear stress March '09 - negative for ischemia    Diabetes mellitus    Diabetic retinopathy    Hearing loss    Hemochromatosis    Hyperlipidemia    Hypertension    Tubular adenoma of colon    Assessment: 73 yo M presents with allergic reaction. Now troponin elevated and has EKG changes. Does have a hx of anterior MI with remote PCI to the LAD on 2001. Cardiology consulted. CBC stable, no s/s of bleed.  Goal of Therapy:  Heparin level 0.3-0.7 units/ml Monitor platelets by anticoagulation protocol: Yes   Plan:  Give 4,000 unit heparin bolus Start heparin infusion at 950 units/hr Check 8 hour heparin level Monitor daily heparin level, CBC, s/s of bleed  Mira Monte Cid 09/18/2023,6:08 AM

## 2023-09-18 NOTE — Consult Note (Addendum)
 Cardiology Consultation   Patient ID: Logan [name] MRN: [id number]; DOB: 02-19-1951  Admit date: 09/17/2023 Date of Consult: 09/18/2023  PCP:  Logan Grandchild, MD   [facility] Providers Cardiologist:  Logan Pates, MD   {  Patient Profile:   Logan [name] is a 73 y.o. male with a hx of hypertension, hyperlipidemia, CKD stage 2, CAD s/p anterior STEMI in 2001 treated with PCI of LAD, nonobstructive disease seen on cath in 2004, negative Myoview in 2009, bradycardia, history of syncope, hemachromatosis, diabetes,  who is being seen 09/18/2023 for the evaluation of elevated troponin levels at the request of Dr. Lazarus [name].  History of Present Illness:   Logan [name] has past medical history as stated above. He presented to [facility] ED on 09/17/2023 for anaphylaxis in the setting of multiple bee stings. He was given epinephrine x 2, Benadryl, 500 cc IV fluids by EMS. He was normotensive with relatively resolved symptoms upon arrival. He was given dexamethasone 10 mg in the ED.   His initial EKG was noted to show some ST elevations inferiorly and ST depressions anteriorly, repeat EKG normalized. Patient was given ASA 324 mg x 1 in the ED and started on ASA 81 mg BID (home dose). He denied any chest pain during this time. Troponin levels 7 > 112 > 1,531 > 3,567. Cardiology fellow was contacted overnight who suggested observation with echo this AM. Echo has been ordered but has not resulted yet.   He is a patient of Logan [name] and was last seen in office 06/2023 for CAD, HTN follow up. At this visit he was noted to be stable, denied any chest pain or shortness of breath. Due to his BP being elevated at 147/70 they added on hydralazine 10 mg BID as the patient did not want aggressive treatment. Other medications included: amlodipine 10 mg daily, ASA 81 mg BID, irbesartan 300 mg daily, Crestor 10 mg daily, triamterene-hydrochlorothiazide 37.5-25 mg daily. He was fairly stable at this visit. His  last echo is from 11/2020 which showed: EF 55-60%, normal RV function, trivial MR. Last ischemic evaluation appears to be a Myoview from 2009 that showed EF 49% but no chest pain, dyspnea or ischemic EKG changes noted during the study.   He was admitted to the medicine service with cardiology consult in the setting of elevating troponin levels. While initially IV heparin was not initiated, after troponin levels continuing to rise, IV heparin was started via pharmacy dosing early morning 4/14.   After speaking with patient, he agrees with the history as stated above. 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 He thinks that it is a result of his antihypertensive medications. He tells me a story about how his wife passed in an allergy clinic from anaphylaxis because they were unable to save her with the epinephrine. He denies any recent or current chest pain or shortness of breath. He says he feels fine and would like to go home as soon as he can. He ate breakfast this morning but has not been on a carb modified diet, therefore as a type 1 diabetic his blood glucose has been higher than normal.    Past Medical History:  Diagnosis Date   Aortic atherosclerosis (HCC)    Cataract    Coronary artery disease involving native coronary artery of native heart without angina pectoris 11/01/2007  Last Cath '04: LAD w/ 60% long lesion; 1st Dx 70% ostial; 2nd Dx  70% ostial; Cx 30% ostial; RCA-dominant multiple 30-40% lesions; PDA 30% lesions  Last nuclear stress March '09 - negative for ischemia    Diabetes mellitus    Diabetic retinopathy    Hearing loss    Hemochromatosis    Hyperlipidemia    Hypertension    Tubular adenoma of colon    Past Surgical History:  Procedure Laterality Date   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   Procedure: Right Great Toe Amputation;  Surgeon: Logan Ford, MD;  Location: [facility];   Service: Orthopedics;  Laterality: Right;  Right Great Toe Amputation   INNER EAR SURGERY     right    Home Medications:  Prior to Admission medications   Medication Sig Start Date End Date Taking? Authorizing Provider  amLODipine (NORVASC) 10 MG tablet TAKE 1 TABLET(10 MG) BY MOUTH DAILY 07/01/23  Yes Logan Knuckles, MD  aspirin 81 MG tablet Take 81 mg by mouth 2 (two) times daily.   Yes [provider]  Continuous Blood Gluc Receiver (FREESTYLE LIBRE 14 DAY READER) DEVI    Yes [provider]  Continuous Blood Gluc Sensor (FREESTYLE LIBRE 14 DAY SENSOR) MISC    Yes [provider]  hydrALAZINE (APRESOLINE) 10 MG tablet Take 1 tablet (10 mg total) by mouth in the morning and at bedtime. 07/04/23  Yes Logan Haggard, MD  insulin degludec (TRESIBA FLEXTOUCH) 200 UNIT/ML FlexTouch Pen Inject 20 Units into the skin daily. 08/25/23  Yes Logan Knuckles, MD  insulin NPH-regular Human (NOVOLIN 70/30) (70-30) 100 UNIT/ML injection Inject 20 Units into the skin 2 (two) times daily with a meal. 07/13/22  Yes Logan Knuckles, MD  irbesartan (AVAPRO) 300 MG tablet TAKE 1 TABLET BY MOUTH EVERY DAY 07/01/23  Yes Logan Knuckles, MD  levothyroxine (SYNTHROID) 88 MCG tablet Take 1 tablet (88 mcg total) by mouth daily before breakfast. 08/25/23  Yes Logan Knuckles, MD  rosuvastatin (CRESTOR) 10 MG tablet Take 1 tablet (10 mg total) by mouth daily. 07/04/23  Yes Logan Haggard, MD  triamterene-hydrochlorothiazide (MAXZIDE-25) 37.5-25 MG tablet Take 1 tablet by mouth daily. 07/04/23  Yes Logan Haggard, MD   Inpatient Medications: Scheduled Meds:  amLODipine  10 mg Oral Daily   aspirin  81 mg Oral BID   famotidine  20 mg Oral Daily   hydrALAZINE  10 mg Oral BID   insulin aspart  0-15 Units Subcutaneous TID WC   insulin aspart protamine- aspart  10 Units Subcutaneous BID WC   insulin glargine-yfgn  8 Units Subcutaneous QHS   irbesartan  300 mg Oral Daily   levothyroxine  88 mcg Oral QAC  breakfast   loratadine  10 mg Oral Daily   rosuvastatin  10 mg Oral Daily   sodium chloride flush  3 mL Intravenous Q12H   triamterene-hydrochlorothiazide  1 tablet Oral Daily   Continuous Infusions:  heparin 950 Units/hr (09/18/23 0654)   PRN Meds: acetaminophen, albuterol, melatonin, ondansetron (ZOFRAN) IV, polyethylene glycol  Allergies:   No Known Allergies  Social History:   Social History   Socioeconomic History   Marital status: Widowed    Spouse name: Not on file   Number of children: 1   Years of education: 78   Highest education level: Not on file  Occupational History   Occupation: retired    Associate Professor: [employer]  Tobacco Use   Smoking status:  Never   Smokeless tobacco: Never  Vaping Use   Vaping status: Never Used  Substance and Sexual Activity   Alcohol use: Yes    Comment: occ beer but very rare   Drug use: No   Sexual activity: Yes    Partners: Female  Other Topics Concern   Not on file  Social History Narrative   HSG. Married - '77 - '04. 1 son '80. Work - semi-retired. Has a girlfriend.   Social Drivers of Corporate investment banker Strain: Low Risk  (03/19/2020)   Overall Financial Resource Strain (CARDIA)    Difficulty of Paying Living Expenses: Not hard at all  Food Insecurity: No Food Insecurity (09/17/2023)   Hunger Vital Sign    Worried About Running Out of Food in the Last Year: Never true    Ran Out of Food in the Last Year: Never true  Transportation Needs: No Transportation Needs (09/17/2023)   PRAPARE - Administrator, Civil Service (Medical): No    Lack of Transportation (Non-Medical): No  Physical Activity: Sufficiently Active (03/19/2020)   Exercise Vital Sign    Days of Exercise per Week: 3 days    Minutes of Exercise per Session: 60 min  Stress: Not on file  Social Connections: Moderately Integrated (09/17/2023)   Social Connection and Isolation Panel [NHANES]    Frequency of Communication with Friends and  Family: More than three times a week    Frequency of Social Gatherings with Friends and Family: More than three times a week    Attends Religious Services: 1 to 4 times per year    Active Member of [employer] or Organizations: No    Attends Banker Meetings: 1 to 4 times per year    Marital Status: Never married  Intimate Partner Violence: Not At Risk (09/17/2023)   Humiliation, Afraid, Rape, and Kick questionnaire    Fear of Current or Ex-Partner: No    Emotionally Abused: No    Physically Abused: No    Sexually Abused: No    Family History:   Family History  Problem Relation Age of Onset   Asthma Mother    Heart failure Mother    Emphysema Mother    Diabetes Father    Alcohol abuse Father    Cancer Father        prostate   Stroke Father    Diabetes Sister    Diabetes Brother    Colon cancer Maternal Uncle        dx'd 42   Blindness Paternal Grandmother    Diabetes Paternal Grandmother    Heart disease Neg Hx    Rectal cancer Neg Hx    Stomach cancer Neg Hx     ROS:  Please see the history of present illness.  All other ROS reviewed and negative.     Physical Exam/Data:   Vitals:   09/17/23 2030 09/17/23 2126 09/18/23 0120 09/18/23 0505  BP: (!) 182/90 (!) 166/83 (!) 153/79 (!) 155/73  Pulse: 91 97 89 72  Resp: 11 18 18 18   Temp:  98.1 F (36.7 C) 98 F (36.7 C) 98.4 F (36.9 C)  TempSrc:      SpO2: 96% 97% 95% 98%  Weight:      Height:       Intake/Output Summary (Last 24 hours) at 09/18/2023 0901 Last data filed at 09/18/2023 0800 Gross per 24 hour  Intake 220 ml  Output --  Net 220 ml  09/17/2023    3:32 PM 08/24/2023    8:56 AM 07/04/2023    8:05 AM  Last 3 Weights  Weight (lbs) 173 lb 8 oz 173 lb 9.6 oz 175 lb  Weight (kg) 78.7 kg 78.744 kg 79.379 kg     Body mass index is 22.28 kg/m.   General:  in no acute distress, sitting up in chair on room air HEENT: normal Neck: no JVD Vascular:  Distal pulses 2+ bilaterally Cardiac:   normal S1, S2; RRR; no murmur  Lungs:  clear to auscultation bilaterally, no wheezing, rhonchi or rales  Abd: soft, nontender Ext: no edema Musculoskeletal:  No deformities Skin: warm and dry  Neuro:  no focal abnormalities noted  EKG:  The EKG was personally reviewed and demonstrates:  initial EKG noted some ischemic changes with STE inferiorly, possibly due to anaphylaxis. Repeat EKG normalized   Telemetry:  Telemetry was personally reviewed and demonstrates:  sinus rhythm, HR 70-80s, PVCs  Relevant CV Studies: Echocardiogram, ordered pending results 4/14  Laboratory Data:  High Sensitivity Troponin:   Recent Labs  Lab 09/17/23 1535 09/17/23 1821 09/18/23 0005 09/18/23 0535  TROPONINIHS 7 112* 1,531* 3,567*     Chemistry Recent Labs  Lab 09/17/23 1535 09/18/23 0535  NA 141 138  K 2.9* 4.7  CL 106 106  CO2 24 23  GLUCOSE 167* 233*  BUN 16 21  CREATININE 1.34* 1.20  CALCIUM 8.6* 8.8*  GFRNONAA 56* >60  ANIONGAP 11 9    No results for input(s): "PROT", "ALBUMIN", "AST", "ALT", "ALKPHOS", "BILITOT" in the last 168 hours. Lipids No results for input(s): "CHOL", "TRIG", "HDL", "LABVLDL", "LDLCALC", "CHOLHDL" in the last 168 hours.  Hematology Recent Labs  Lab 09/17/23 1535  WBC 12.3*  RBC 4.80  HGB 15.1  HCT 46.8  MCV 97.5  MCH 31.5  MCHC 32.3  RDW 13.9  PLT 199   Thyroid No results for input(s): "TSH", "FREET4" in the last 168 hours.  BNPNo results for input(s): "BNP", "PROBNP" in the last 168 hours.  DDimer No results for input(s): "DDIMER" in the last 168 hours.  Radiology/Studies:  No results found.  Assessment and Plan:   Elevated troponin levels Suspected type 2 NSTEMI  CAD s/p remote PCI to LAD in 2001  History of remote PCI to LAD in 2001 Last ischemic evaluation was 2009, negative Myoview  Initial EKG in ED showed some ischemic changes, some ST elevation in inferior leads with ST depression in anterior leads Repeat EKG showed resolution of  these changes Patient denies any prior or current anginal symptoms  Thought to be type 2 NSTEMI in setting of global ischemia from hypotension (noted to have BP 92/68 per EMS) and anaphylaxis  Patient was given ASA 324 mg x 1 in ED Troponin levels 7 > 112 > 1,531 > 3,567 Updated echo ordered, pending results  Currently NPO in the setting of possible cath, will discuss plans with MD Patient ate breakfast this morning 4/14 at 8:30 am Started on IV heparin per pharmacy 4/14 AM  Currently on home ASA 81 mg BID  Currently on home Crestor 10 mg daily   Hypertension Noted to be hypotensive per EMS with anaphylaxis  Most recent BP 155/73 He believes this reaction was not due to bees but due to his many antihypertensives, although he has been on this medication regimen for some time without any prior issues and developed the symptoms after being stung by 10+ bees  Currently on home amlodipine 10  mg daily Currently on home  hydralazine 10 mg BID Currently on home irbesartan 300 mg daily  Currently on home triamterene-hydrochlorothiazide 37.5-25 mg daily   Hyperlipidemia  04/25/2023: HDL 44.40; LDL Cholesterol 50 06/26/2023: ALT 29  Currently on home Crestor 10 mg daily   Per primary Anaphylaxis, bee stings Leukocytosis, likely reactive Hypokalemia  Type 1 diabetes  CKD stage 2, baseline 1.1   Risk Assessment/Risk Scores:     TIMI Risk Score for Unstable Angina or Non-ST Elevation MI:   The patient's TIMI risk score is 5, which indicates a 26% risk of all cause mortality, new or recurrent myocardial infarction or need for urgent revascularization in the next 14 days.    For questions or updates, please contact [facility] Please consult [email or web address] for contact info under    Signed, Jiles Mote, PA-C  09/18/2023 9:01 AM  I have seen and examined the patient along with Jiles Mote, PA-C.  I have reviewed the chart, notes and new data.  I agree with PA/NP's  note.  Key new complaints: He feels great and is eager to go home.  He never had angina during these events. Key examination changes: He looks very fit and is clearly very active.  Normal cardiovascular examination. Key new findings / data: ECG showed very subtle and very transient ST segment elevation in the inferior leads, but he has had a previous myocardial infarction in those leads.  There is a striking elevation in high-sensitivity troponin I, but this is relatively mild.  Reviewed his echocardiogram at the bedside.  There was a very small area of inferobasal hypokinesis.  When compared side-by-side with his previous echocardiogram from 2022 this is not changed (even though it was not reported on the previous echo).  Overall left ventricular systolic function is excellent and he does not have any serious valvular abnormalities.  There does appear to be very slight elevation in systolic pulmonary artery pressure, the previous study did not report PA pressure.  PLAN: Small demand myocardial infarction.  No plan for invasive coronary workup. [facility] will sign off.   Medication Recommendations: Resume his home medications of aspirin 81 mg daily, rosuvastatin 10 mg daily, amlodipine 10 mg daily, irbesartan 300 mg daily, triamterene-hydrochlorothiazide 37.5/25 mg daily.   Recommend increasing the hydralazine 10 mg to 3 times daily.   Also recommend adding spironolactone 12.5 mg daily since this will help with both his blood pressure and the hypokalemia from the hydrochlorothiazide. Other recommendations (labs, testing, etc): Repeat a basic metabolic panel in about a month. Follow up as an outpatient: Will schedule a follow-up appointment in about a month's time.  Luana Rumple, MD, [facility] [phone] 09/18/2023, 12:05 PM

## 2023-09-19 ENCOUNTER — Telehealth: Payer: Self-pay | Admitting: Internal Medicine

## 2023-09-19 LAB — HEMOGLOBIN A1C
Hgb A1c MFr Bld: 7.5 % — ABNORMAL HIGH (ref 4.8–5.6)
Mean Plasma Glucose: 169 mg/dL

## 2023-09-19 NOTE — Telephone Encounter (Signed)
 Called patient regarding medication changes made at the hospital (had anaphylaxis following bee string and NSTEMI)  He notes they stopped his [place] and did not explain why - per hospital notes they did not think pt needs two long acting insulins (Tresiba and Novolin 70/30). He did not take [place] last night and BG was 83 this morning. Advised pt to contact his endocrinologist (Dr. Ronelle Coffee) to review this change and to also monitor BG closely and notify endocrinologist if it starts it increase.   He also notes they increased hydralazine to TID and started spironolactone. Explained that they made these changes because his BP was very elevated during his hospitalization. He is worried about taking too many BP medications. Recommended to continue with the changes so he can come on 4/24 to have BP checked in office when he sees Dr. Rochelle Chu.   He plans to follow up with the allergy clinic as well and has EpiPen with him now in case of future bee stings.  Rainelle Bur, PharmD, BCPS, CPP Clinical Pharmacist Practitioner [facility] [phone]

## 2023-09-19 NOTE — Telephone Encounter (Signed)
 Pt went to ED for Anaphylaxis from bee stings and when he was discharged the ED told him to stop taking his insulin and that his dosage for his BP meds was too high.  Pt is unsure of what to do concerning his medications, please call and advise.  [phone]

## 2023-09-20 ENCOUNTER — Encounter: Payer: Self-pay | Admitting: Allergy

## 2023-09-20 ENCOUNTER — Ambulatory Visit (INDEPENDENT_AMBULATORY_CARE_PROVIDER_SITE_OTHER): Admitting: Allergy

## 2023-09-20 VITALS — BP 172/78 | HR 84 | Temp 98.2°F | Ht 74.0 in | Wt 171.1 lb

## 2023-09-20 DIAGNOSIS — T782XXD Anaphylactic shock, unspecified, subsequent encounter: Secondary | ICD-10-CM

## 2023-09-20 DIAGNOSIS — T63481D Toxic effect of venom of other arthropod, accidental (unintentional), subsequent encounter: Secondary | ICD-10-CM | POA: Diagnosis not present

## 2023-09-20 NOTE — Progress Notes (Signed)
 New Patient Note  RE: Logan [name] MRN: [id number] DOB: 1951/06/05 Date of Office Visit: 09/20/2023  Primary care provider: Etta Grandchild, MD  Chief Complaint: bee sting reaction  History of present illness: Logan [name] is a 73 y.o. male presenting today for evaluation of bee sting reaction.  He was seen in the ED on 09/17/2023 for anaphylaxis after receiving multiple bee stings.  Upon EMS arrival he was noted to be hypotensive with urticaria and was treated with IM epinephrine x 2, Benadryl, IV fluids and dexamethasone prior to presentation to the ED.  At arrival to the ED his vitals had normalized and he was hypertensive and had no airway concern.  He was admitted from 09/17/2023 to 09/18/2023.   During his workup he also had uptrending troponins and he had a cardiology consult related to this for a non-STEMI event.  He was prescribed an epinephrine device.  Discussed the use of AI scribe software for clinical note transcription with the patient, who gave verbal consent to proceed.  He is a Pension scheme manager.  He was stung by approximately eleven bees, with six stings on his right arm and five on the left arm. He removed the stingers himself.  He states he did not start feeling symptoms right away and took about 20 minutes while driving home. He first started to feel tingly all over then he experienced sweating, lip tingling, and a sensation of venom moving through his body. Upon reaching home, he felt he was in trouble and called EMS.  He states he was starting to feel like he was dizzy but did not pass out.  He got home and kept the door unlocked and waited for EMS to arrive.  Throughout all of this he denies having any breathing difficulties, vomiting, or diarrhea. EMS administered two shots of epinephrine and Benadryl, which led to tremors lasting about an hour and a half after reaching the hospital (see HPI above)  He has been stung by various bees in the past, including honeybees, wasps,  hornets, and bumblebees however he has not had any reaction like the one he had recently.  Review of systems: 10pt ROS negative unless noted above in HPI  Past medical history: Past Medical History:  Diagnosis Date   Aortic atherosclerosis (HCC)    Cataract    Coronary artery disease involving native coronary artery of native heart without angina pectoris 11/01/2007   Last Cath '04: LAD w/ 60% long lesion; 1st Dx 70% ostial; 2nd Dx  70% ostial; Cx 30% ostial; RCA-dominant multiple 30-40% lesions; PDA 30% lesions  Last nuclear stress March '09 - negative for ischemia    Diabetes mellitus    Diabetic retinopathy    Hearing loss    Hemochromatosis    Hyperlipidemia    Hypertension    Tubular adenoma of colon     Past surgical history: Past Surgical History:  Procedure Laterality Date   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   Procedure: Right Great Toe Amputation;  Surgeon: Nadara Mustard, MD;  Location: [facility];  Service: Orthopedics;  Laterality: Right;  Right Great Toe Amputation   INNER EAR SURGERY     right    Family history:  Family History  Problem Relation Age of Onset   Asthma Mother    Heart failure Mother    Emphysema Mother    Diabetes Father    Alcohol abuse Father    Cancer Father        prostate  Stroke Father    Diabetes Sister    Diabetes Brother    Colon cancer Maternal Uncle        dx'd 52   Blindness Paternal Grandmother    Diabetes Paternal Grandmother    Heart disease Neg Hx    Rectal cancer Neg Hx    Stomach cancer Neg Hx     Social history: Lives in a home with carpeting with gas heating and central cooling.  No pets in the home.  There is no concern for water damage, mildew or roaches in the home.  He is retired.  Denies any smoking history.   Medication List: Current Outpatient Medications  Medication Sig Dispense Refill   amLODipine (NORVASC) 10 MG tablet TAKE 1 TABLET(10 MG) BY MOUTH DAILY 90 tablet 0   aspirin 81 MG tablet Take 81 mg by mouth  2 (two) times daily.     Continuous Blood Gluc Receiver (FREESTYLE LIBRE 14 DAY READER) DEVI      Continuous Blood Gluc Sensor (FREESTYLE LIBRE 14 DAY SENSOR) MISC      EPINEPHrine (EPIPEN 2-PAK) 0.3 mg/0.3 mL IJ SOAJ injection Inject 0.3 mg into the muscle as needed for anaphylaxis. 1 each 1   famotidine (PEPCID) 20 MG tablet Take 1 tablet (20 mg total) by mouth daily as needed (allergy symptoms). 10 tablet 0   hydrALAZINE (APRESOLINE) 10 MG tablet Take 1 tablet (10 mg total) by mouth 3 (three) times daily. 180 tablet 3   insulin NPH-regular Human (NOVOLIN 70/30) (70-30) 100 UNIT/ML injection Inject 20 Units into the skin 2 (two) times daily with a meal. 48 mL 0   irbesartan (AVAPRO) 300 MG tablet TAKE 1 TABLET BY MOUTH EVERY DAY 90 tablet 0   levothyroxine (SYNTHROID) 88 MCG tablet Take 1 tablet (88 mcg total) by mouth daily before breakfast. 90 tablet 1   loratadine (CLARITIN) 10 MG tablet Take 1 tablet (10 mg total) by mouth daily as needed for allergies. 10 tablet 0   rosuvastatin (CRESTOR) 10 MG tablet Take 1 tablet (10 mg total) by mouth daily. 90 tablet 3   spironolactone (ALDACTONE) 25 MG tablet Take 0.5 tablets (12.5 mg total) by mouth daily. 30 tablet 0   triamterene-hydrochlorothiazide (MAXZIDE-25) 37.5-25 MG tablet Take 1 tablet by mouth daily. 90 tablet 3   No current facility-administered medications for this visit.    Known medication allergies: No Known Allergies   Physical examination: Blood pressure (!) 172/78, pulse 84, temperature 98.2 F (36.8 C), temperature source Temporal, height 6\' 2"  (1.88 m), weight 171 lb 1 oz (77.6 kg), SpO2 98%.  General: Alert, interactive, in no acute distress. HEENT: PERRLA Lungs: Clear to auscultation without wheezing, rhonchi or rales. {no increased work of breathing. CV: Normal S1, S2 without murmurs. Abdomen: Nondistended, nontender. Skin: Warm and dry, without lesions or rashes. Extremities:  No clubbing, cyanosis or  edema. Neuro:   Grossly intact.  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 Discussed EpiPen use and potential venom immunotherapy. - Order lab work for bee venom and other insect allergies. - Have access to EpiPen 0.3mg  incase of allergic reaction.  Follow emergency action plan in case of allergic reaction (provided today) - Consider venom immunotherapy if allergy confirmed.  Venom immunotherapy is at least a 5 year therapy that desensitizes you to venoms so if you were to get stung again you have less severe reaction.   -  Advise protective clothing during beekeeping.  Will call you in about 1 week with lab results and discuss next steps Follow-up in 1 year or sooner if needed  I appreciate the opportunity to take part in Taim's care. Please do not hesitate to contact me with questions.  Sincerely,   Catha Clink, MD Allergy/Immunology [facility]

## 2023-09-20 NOTE — Patient Instructions (Signed)
 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 Discussed EpiPen use and potential venom immunotherapy. - Order lab work for bee venom and other insect allergies. - Have access to EpiPen 0.3mg  incase of allergic reaction.  Follow emergency action plan in case of allergic reaction (provided today) - Consider venom immunotherapy if allergy confirmed.  Venom immunotherapy is at least a 5 year therapy that desensitizes you to venoms so if you were to get stung again you have less severe reaction.   - Advise protective clothing during beekeeping.  Will call you in about 1 week with lab results and discuss next steps Follow-up in 1 year or sooner if needed

## 2023-09-25 ENCOUNTER — Inpatient Hospital Stay: Payer: Medicare Other | Attending: Hematology and Oncology

## 2023-09-25 LAB — CBC WITH DIFFERENTIAL (CANCER CENTER ONLY)
Abs Immature Granulocytes: 0.03 10*3/uL (ref 0.00–0.07)
Basophils Absolute: 0.1 10*3/uL (ref 0.0–0.1)
Basophils Relative: 2 %
Eosinophils Absolute: 0.5 10*3/uL (ref 0.0–0.5)
Eosinophils Relative: 7 %
HCT: 43.3 % (ref 39.0–52.0)
Hemoglobin: 15 g/dL (ref 13.0–17.0)
Immature Granulocytes: 0 %
Lymphocytes Relative: 32 %
Lymphs Abs: 2.2 10*3/uL (ref 0.7–4.0)
MCH: 32 pg (ref 26.0–34.0)
MCHC: 34.6 g/dL (ref 30.0–36.0)
MCV: 92.3 fL (ref 80.0–100.0)
Monocytes Absolute: 0.9 10*3/uL (ref 0.1–1.0)
Monocytes Relative: 13 %
Neutro Abs: 3.1 10*3/uL (ref 1.7–7.7)
Neutrophils Relative %: 46 %
Platelet Count: 200 10*3/uL (ref 150–400)
RBC: 4.69 MIL/uL (ref 4.22–5.81)
RDW: 13.5 % (ref 11.5–15.5)
WBC Count: 6.8 10*3/uL (ref 4.0–10.5)
nRBC: 0 % (ref 0.0–0.2)

## 2023-09-27 ENCOUNTER — Other Ambulatory Visit (INDEPENDENT_AMBULATORY_CARE_PROVIDER_SITE_OTHER): Payer: Self-pay

## 2023-09-27 ENCOUNTER — Ambulatory Visit: Admitting: Orthopedic Surgery

## 2023-09-27 DIAGNOSIS — M25512 Pain in left shoulder: Secondary | ICD-10-CM

## 2023-09-27 DIAGNOSIS — M25511 Pain in right shoulder: Secondary | ICD-10-CM

## 2023-09-28 ENCOUNTER — Other Ambulatory Visit: Payer: Self-pay | Admitting: Internal Medicine

## 2023-09-28 ENCOUNTER — Ambulatory Visit (INDEPENDENT_AMBULATORY_CARE_PROVIDER_SITE_OTHER): Admitting: Internal Medicine

## 2023-09-28 ENCOUNTER — Encounter: Payer: Self-pay | Admitting: Physician Assistant

## 2023-09-28 ENCOUNTER — Encounter: Payer: Self-pay | Admitting: Internal Medicine

## 2023-09-28 VITALS — BP 136/82 | HR 70 | Temp 98.6°F | Ht 74.0 in | Wt 167.6 lb

## 2023-09-28 DIAGNOSIS — I10 Essential (primary) hypertension: Secondary | ICD-10-CM | POA: Diagnosis not present

## 2023-09-28 DIAGNOSIS — E039 Hypothyroidism, unspecified: Secondary | ICD-10-CM | POA: Diagnosis not present

## 2023-09-28 DIAGNOSIS — E109 Type 1 diabetes mellitus without complications: Secondary | ICD-10-CM | POA: Diagnosis not present

## 2023-09-28 NOTE — Progress Notes (Signed)
 Subjective:  Patient ID: Logan [name], male    DOB: March 28, 1951  Age: 73 y.o. MRN: [id number]  CC: Hospitalization Follow-up (Seen in ED for Anaphylaxis ), Medication Problem (Medication management, blood pressures are fluctuating), Weight Loss (Patient feels he is losing weight fast, was 171 04/16 and 167 today), and Hypertension   HPI Logan [name] presents for f/up ----  Discussed the use of AI scribe software for clinical note transcription with the patient, who gave verbal consent to proceed.  History of Present Illness   Logan [name] is a 73 year old male who presents with a history of anaphylaxis following bee stings.  He experienced an anaphylactic reaction after being stung multiple times by bees while collecting a swarm. Symptoms included sweating, tingling lips, and a sensation of impending trouble while driving home. Upon reaching home, he felt weak and had difficulty walking, prompting him to call 911. Emergency services administered an EpiPen  and Benadryl, stabilizing his condition. He was transported to the hospital, where initial blood tests were normal, but subsequent tests showed elevated enzymes. He was kept overnight for observation and underwent a cardiac evaluation, which showed no significant abnormalities. His blood pressure remained elevated for about 12 hours post-event.  He has experienced a recent weight loss of approximately 10 pounds over a few months, which is unusual for him. He attributes some of this to changes in his eating habits and physical activity levels. He also has a history of low potassium levels, which he associates with his blood pressure medications that increase urination.  No ongoing shortness of breath, chest pain, dizziness, nausea, vomiting, or irregular heartbeats.   He has a history of shoulder and neck issues, with tingling in three fingers and some fatigue when walking uphill. A specialist found no significant shoulder problems but  noted possible nerve issues in the neck.       Outpatient Medications Prior to Visit  Medication Sig Dispense Refill   amLODipine  (NORVASC ) 10 MG tablet TAKE 1 TABLET(10 MG) BY MOUTH DAILY 90 tablet 0   aspirin  81 MG tablet Take 81 mg by mouth 2 (two) times daily.     Continuous Blood Gluc Receiver (FREESTYLE LIBRE 14 DAY READER) DEVI      Continuous Blood Gluc Sensor (FREESTYLE LIBRE 14 DAY SENSOR) MISC      EPINEPHrine  (EPIPEN  2-PAK) 0.3 mg/0.3 mL IJ SOAJ injection Inject 0.3 mg into the muscle as needed for anaphylaxis. 1 each 1   famotidine  (PEPCID ) 20 MG tablet Take 1 tablet (20 mg total) by mouth daily as needed (allergy symptoms). 10 tablet 0   hydrALAZINE  (APRESOLINE ) 10 MG tablet Take 1 tablet (10 mg total) by mouth 3 (three) times daily. 180 tablet 3   insulin  NPH-regular Human (NOVOLIN  70/30) (70-30) 100 UNIT/ML injection Inject 20 Units into the skin 2 (two) times daily with a meal. 48 mL 0   irbesartan  (AVAPRO ) 300 MG tablet TAKE 1 TABLET BY MOUTH EVERY DAY 90 tablet 0   levothyroxine  (SYNTHROID ) 88 MCG tablet Take 1 tablet (88 mcg total) by mouth daily before breakfast. 90 tablet 1   rosuvastatin  (CRESTOR ) 10 MG tablet Take 1 tablet (10 mg total) by mouth daily. 90 tablet 3   spironolactone  (ALDACTONE ) 25 MG tablet Take 0.5 tablets (12.5 mg total) by mouth daily. 30 tablet 0   loratadine  (CLARITIN ) 10 MG tablet Take 1 tablet (10 mg total) by mouth daily as needed for allergies. (Patient not taking: Reported on 09/28/2023) 10 tablet  0   triamterene -hydrochlorothiazide  (MAXZIDE -25) 37.5-25 MG tablet Take 1 tablet by mouth daily. (Patient not taking: Reported on 09/28/2023) 90 tablet 3   No facility-administered medications prior to visit.    ROS Review of Systems  Constitutional:  Positive for fatigue and unexpected weight change. Negative for appetite change, chills and diaphoresis.  HENT: Negative.    Eyes: Negative.   Respiratory:  Negative for cough, chest tightness,  shortness of breath and wheezing.   Cardiovascular:  Negative for chest pain, palpitations and leg swelling.  Gastrointestinal:  Negative for abdominal pain, constipation, diarrhea, nausea and vomiting.  Endocrine: Negative.   Genitourinary: Negative.  Negative for difficulty urinating.  Musculoskeletal:  Positive for arthralgias. Negative for myalgias.  Skin: Negative.  Negative for color change.  Neurological: Negative.   Hematological:  Negative for adenopathy. Does not bruise/bleed easily.  Psychiatric/Behavioral: Negative.      Objective:  BP 136/82 (BP Location: Left Arm, Patient Position: Sitting)   Pulse 70   Temp 98.6 F (37 C) (Temporal)   Ht 6\' 2"  (1.88 m)   Wt 167 lb 9.6 oz (76 kg)   SpO2 96%   BMI 21.52 kg/m   BP Readings from Last 3 Encounters:  09/28/23 136/82  09/20/23 (!) 172/78  09/18/23 (!) 171/109    Wt Readings from Last 3 Encounters:  09/28/23 167 lb 9.6 oz (76 kg)  09/20/23 171 lb 1 oz (77.6 kg)  09/17/23 173 lb 8 oz (78.7 kg)    Physical Exam Vitals reviewed.  Constitutional:      Appearance: Normal appearance.  HENT:     Nose: Nose normal.     Mouth/Throat:     Mouth: Mucous membranes are moist.  Eyes:     General: No scleral icterus.    Conjunctiva/sclera: Conjunctivae normal.  Cardiovascular:     Rate and Rhythm: Normal rate and regular rhythm.     Heart sounds: Murmur heard.     Systolic murmur is present with a grade of 1/6.     No diastolic murmur is present.     No friction rub. No gallop.  Pulmonary:     Effort: Pulmonary effort is normal.     Breath sounds: No stridor. No wheezing, rhonchi or rales.  Abdominal:     General: Abdomen is flat.     Palpations: There is no mass.     Tenderness: There is no abdominal tenderness. There is no guarding.     Hernia: No hernia is present.  Musculoskeletal:        General: Normal range of motion.     Cervical back: Neck supple.     Right lower leg: No edema.     Left lower leg: No  edema.  Lymphadenopathy:     Cervical: No cervical adenopathy.  Skin:    General: Skin is warm and dry.  Neurological:     General: No focal deficit present.     Mental Status: He is alert. Mental status is at baseline.  Psychiatric:        Mood and Affect: Mood normal.        Behavior: Behavior normal.     Lab Results  Component Value Date   WBC 6.8 09/25/2023   HGB 15.0 09/25/2023   HCT 43.3 09/25/2023   PLT 200 09/25/2023   GLUCOSE 233 (H) 09/18/2023   CHOL 98 09/18/2023   TRIG 22 09/18/2023   HDL 54 09/18/2023   LDLCALC 40 09/18/2023   ALT 29 06/26/2023  AST 25 06/26/2023   NA 138 09/18/2023   K 4.7 09/18/2023   CL 106 09/18/2023   CREATININE 1.20 09/18/2023   BUN 21 09/18/2023   CO2 23 09/18/2023   TSH 2.23 08/24/2023   PSA 3.54 04/25/2023   HGBA1C 7.5 (H) 09/18/2023   MICROALBUR 24.3 (H) 08/24/2023    ECHOCARDIOGRAM COMPLETE Result Date: 09/18/2023    ECHOCARDIOGRAM REPORT   Patient Name:   DERWARD MARPLE Date of Exam: 09/18/2023 Medical Rec #:  [id number]      Height:       74.0 in Accession #:    [id number]     Weight:       173.5 lb Date of Birth:  05/14/1951      BSA:          2.046 m Patient Age:    72 years       BP:           155/73 mmHg Patient Gender: M              HR:           97 bpm. Exam Location:  Inpatient Procedure: 2D Echo, 3D Echo, Cardiac Doppler and Color Doppler (Both Spectral            and Color Flow Doppler were utilized during procedure). Indications:    Nstemi  History:        Patient has prior history of Echocardiogram examinations, most                 recent 12/03/2020. Risk Factors:Hypertension, Diabetes and                 Dyslipidemia.  Sonographer:    Reta Cassis Referring Phys: [id number] JONATHAN SEGARS IMPRESSIONS  1. Left ventricular ejection fraction, by estimation, is 65 to 70%. The left ventricle has normal function. The left ventricle has no regional wall motion abnormalities. Left ventricular diastolic parameters are consistent with  Grade I diastolic dysfunction (impaired relaxation).  2. Right ventricular systolic function is normal. The right ventricular size is normal. There is mildly elevated pulmonary artery systolic pressure.  3. The mitral valve is normal in structure. Trivial mitral valve regurgitation. No evidence of mitral stenosis.  4. The aortic valve is tricuspid. There is mild calcification of the aortic valve. Aortic valve regurgitation is not visualized. Aortic valve sclerosis/calcification is present, without any evidence of aortic stenosis. Aortic valve area, by VTI measures  2.07 cm. Aortic valve mean gradient measures 7.0 mmHg. Aortic valve Vmax measures 1.71 m/s.  5. The inferior vena cava is normal in size with greater than 50% respiratory variability, suggesting right atrial pressure of 3 mmHg. FINDINGS  Left Ventricle: Left ventricular ejection fraction, by estimation, is 65 to 70%. The left ventricle has normal function. The left ventricle has no regional wall motion abnormalities. The left ventricular internal cavity size was normal in size. There is  no left ventricular hypertrophy. Left ventricular diastolic parameters are consistent with Grade I diastolic dysfunction (impaired relaxation). Right Ventricle: The right ventricular size is normal. No increase in right ventricular wall thickness. Right ventricular systolic function is normal. There is mildly elevated pulmonary artery systolic pressure. The tricuspid regurgitant velocity is 2.99  m/s, and with an assumed right atrial pressure of 3 mmHg, the estimated right ventricular systolic pressure is 38.8 mmHg. Left Atrium: Left atrial size was normal in size. Right Atrium: Right atrial size was normal in size. Pericardium: There is no  evidence of pericardial effusion. Mitral Valve: The mitral valve is normal in structure. Trivial mitral valve regurgitation. No evidence of mitral valve stenosis. Tricuspid Valve: The tricuspid valve is normal in structure. Tricuspid  valve regurgitation is trivial. No evidence of tricuspid stenosis. Aortic Valve: The aortic valve is tricuspid. There is mild calcification of the aortic valve. Aortic valve regurgitation is not visualized. Aortic valve sclerosis/calcification is present, without any evidence of aortic stenosis. Aortic valve mean gradient measures 7.0 mmHg. Aortic valve peak gradient measures 11.7 mmHg. Aortic valve area, by VTI measures 2.07 cm. Pulmonic Valve: The pulmonic valve was normal in structure. Pulmonic valve regurgitation is not visualized. No evidence of pulmonic stenosis. Aorta: The aortic root is normal in size and structure. Venous: The inferior vena cava is normal in size with greater than 50% respiratory variability, suggesting right atrial pressure of 3 mmHg. IAS/Shunts: No atrial level shunt detected by color flow Doppler. Additional Comments: 3D was performed not requiring image post processing on an independent workstation and was normal.  LEFT VENTRICLE PLAX 2D LVIDd:         5.30 cm   Diastology LVIDs:         3.70 cm   LV e' medial:    9.03 cm/s LV PW:         0.80 cm   LV E/e' medial:  10.5 LV IVS:        0.90 cm   LV e' lateral:   10.70 cm/s LVOT diam:     1.90 cm   LV E/e' lateral: 8.9 LV SV:         69 LV SV Index:   34 LVOT Area:     2.84 cm                           3D Volume EF:                          3D EF:        54 %                          LV EDV:       167 ml                          LV ESV:       77 ml                          LV SV:        90 ml RIGHT VENTRICLE             IVC RV Basal diam:  4.30 cm     IVC diam: 1.20 cm RV S prime:     15.30 cm/s TAPSE (M-mode): 3.0 cm LEFT ATRIUM             Index        RIGHT ATRIUM           Index LA diam:        3.40 cm 1.66 cm/m   RA Area:     19.90 cm LA Vol (A2C):   67.7 ml 33.09 ml/m  RA Volume:   61.70 ml  30.16 ml/m LA Vol (A4C):   44.2 ml 21.60 ml/m LA Biplane Vol: 54.9 ml 26.83 ml/m  AORTIC VALVE AV Area (Vmax):    2.06 cm AV Area  (Vmean):   2.01 cm AV Area (VTI):     2.07 cm AV Vmax:           171.00 cm/s AV Vmean:          119.000 cm/s AV VTI:            0.331 m AV Peak Grad:      11.7 mmHg AV Mean Grad:      7.0 mmHg LVOT Vmax:         124.00 cm/s LVOT Vmean:        84.400 cm/s LVOT VTI:          0.242 m LVOT/AV VTI ratio: 0.73  AORTA Ao Root diam: 2.80 cm Ao Asc diam:  3.10 cm MITRAL VALVE                TRICUSPID VALVE MV Area (PHT): 4.41 cm     TR Peak grad:   35.8 mmHg MV Decel Time: 172 msec     TR Vmax:        299.00 cm/s MV E velocity: 95.10 cm/s MV A velocity: 117.00 cm/s  SHUNTS MV E/A ratio:  0.81         Systemic VTI:  0.24 m                             Systemic Diam: 1.90 cm Jules Oar MD Electronically signed by Jules Oar MD Signature Date/Time: 09/18/2023/12:16:43 PM    Final     Assessment & Plan:   Essential hypertension- His BP is well controlled.  Type 1 diabetes mellitus without complication (HCC)- His blood sugar is adequately well controlled.  Acquired hypothyroidism- He is euthyroid.     Follow-up: No follow-ups on file.  Sandra Crouch, MD

## 2023-09-29 ENCOUNTER — Encounter: Payer: Self-pay | Admitting: Orthopedic Surgery

## 2023-09-29 NOTE — Progress Notes (Signed)
 Office Visit Note   Patient: Logan [name]           Date of Birth: January 07, 1951           MRN: [id number] Visit Date: 09/27/2023 Requested by: Arcadio Knuckles, MD [address] [place],  [place] [address] PCP: Arcadio Knuckles, MD  Subjective: Chief Complaint  Patient presents with   Other    Bilateral shoulder discomfort    HPI: Logan [name] is a 73 y.o. male who presents to the office reporting bilateral shoulder pain on and off for years.  He describes 4 discrete episodes in the last 40 years where he has had weakness and radicular symptoms involving both arms.  He is right-hand dominant but he does most tasks with either hand.  Does describe some numbness and tingling in the middle ring and small finger of the left hand.  Reports decreased strength in grip in the left arm and hand.  Denies much in the way of neck pain or radicular pain.  He is careful how he moves his arms patient states he did lift a 5 gallon water pill last year and he felt some tearing in his shoulder.  Patient states "obviously I am not any pain".  He is a [employer] scheme manager.  He states he discontinued some of his blood pressure medication that he was on and his shoulders got better.  Patient does have type 1 diabetes..                ROS: All systems reviewed are negative as they relate to the chief complaint within the history of present illness.  Patient denies fevers or chills.  Assessment & Plan: Visit Diagnoses:  1. Bilateral shoulder pain, unspecified chronicity     Plan: Impression is neck and bilateral shoulder pain with fairly normal shoulder examination and radiographs.  He does have thenar atrophy in both hands which is the most significant objective physical exam finding.  Also has significant arthritis on neck radiographs.  I think he likely has stenosis in his cervical spine but no physical exam evidence of radiculopathy or myelopathy.  Because of that atrophy-recommended that he undergo cervical spine MRI  testing to rule out stenosis.  He wants to hold off on that intervention.  We will see him back as needed.  Follow-Up Instructions: No follow-ups on file.   Orders:  Orders Placed This Encounter  Procedures   XR Shoulder Right   XR Shoulder Left   XR Cervical Spine 2 or 3 views   No orders of the defined types were placed in this encounter.     Procedures: No procedures performed   Clinical Data: No additional findings.  Objective: Vital Signs: There were no vitals taken for this visit.  Physical Exam:  Constitutional: Patient appears well-developed HEENT:  Head: Normocephalic Eyes:EOM are normal Neck: Normal range of motion Cardiovascular: Normal rate Pulmonary/chest: Effort normal Neurologic: Patient is alert Skin: Skin is warm Psychiatric: Patient has normal mood and affect  Ortho Exam: Ortho exam demonstrates thenar wasting with some interosseous weakness in both hands.  Ulnar nerve nontender with no subluxation in the cubital tunnel bilaterally.  He does have good EPL FPL function along with wrist flexion extension biceps and triceps and deltoid strength.  Patient has good rotator cuff strength isolated infraspinatus supraspinatus and subscap muscle testing.  Has good cervical spine range of motion with flexion extension and rotation.  No muscle wasting or atrophy in the arms outside  of the first webspace.  Negative Hoffman's reflex and good lower extremity strength without hyperreflexia.  Or clonus.  Specialty Comments:  No specialty comments available.  Imaging: No results found.   PMFS History: Patient Active Problem List   Diagnosis Date Noted   Bee sting 09/17/2023   Dermatitis venenata 07/13/2022   Screen for colon cancer 07/13/2022   Diabetes mellitus type 1 with atherosclerosis of arteries of extremities (HCC) 07/13/2022   Polyp of colon 11/30/2020   Systolic murmur 11/30/2020   Need for shingles vaccine 11/30/2020   Aortic atherosclerosis (HCC)  10/09/2020   Diabetic retinopathy (HCC) 09/21/2020   Pure hypercholesterolemia 09/21/2020   Chronic renal impairment, stage 2 (mild) 07/03/2018   Nonspecific abnormal electrocardiogram (ECG) (EKG) 12/30/2015   Chronic foot ulcer with fat layer exposed (HCC) 01/19/2015   Diabetic neuropathy (HCC) 01/19/2015   Diabetic ulcer of both feet associated with type 2 diabetes mellitus (HCC) 01/19/2015   BPH (benign prostatic hyperplasia) 08/20/2014   Nuclear sclerosis of both eyes 04/16/2014   Type 1 diabetes mellitus without complication (HCC) 04/16/2014   Hypothyroidism 01/08/2014   Hyperlipidemia LDL goal <70 05/05/2009   Uncontrolled type 1 diabetes mellitus 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 11/01/2007   Hereditary hemochromatosis (HCC) 11/01/2007   Essential hypertension 11/01/2007   Coronary artery disease 11/01/2007   Past Medical History:  Diagnosis Date   Aortic atherosclerosis (HCC)    Cataract    Coronary artery disease involving native coronary artery of native heart without angina pectoris 11/01/2007   Last Cath '04: LAD w/ 60% long lesion; 1st Dx 70% ostial; 2nd Dx  70% ostial; Cx 30% ostial; RCA-dominant multiple 30-40% lesions; PDA 30% lesions  Last nuclear stress March '09 - negative for ischemia    Diabetes mellitus    Diabetic retinopathy    Hearing loss    Hemochromatosis    Hyperlipidemia    Hypertension    Tubular adenoma of colon     Family History  Problem Relation Age of Onset   Asthma Mother    Heart failure Mother    Emphysema Mother    Diabetes Father    Alcohol abuse Father    Cancer Father        prostate   Stroke Father    Diabetes Sister    Diabetes Brother    Colon cancer Maternal Uncle        dx'd 79   Blindness Paternal Grandmother    Diabetes Paternal Grandmother    Heart disease Neg Hx    Rectal cancer Neg Hx    Stomach cancer Neg Hx     Past Surgical History:  Procedure Laterality Date   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

## 2023-09-30 LAB — O214-IGE CCD DETERMINANTS: O214-IgE CCD Determinants: <0.1 kU/L

## 2023-09-30 LAB — HYMENOPTERA VENOM ALLERGY II
Bumblebee: <0.1 kU/L
Hornet, White Face, IgE: 0.38 kU/L — AB
Hornet, Yellow, IgE: 0.23 kU/L — AB
I001-IgE Honeybee: 2.87 kU/L — AB
I003-IgE Yellow Jacket: 0.36 kU/L — AB
I004-IgE Paper Wasp: 0.28 kU/L — AB
I208-IgE Api m 1: 0.7 kU/L — AB
I209-IgE Ves v 5: 0.18 kU/L — AB
I210-IgE Pol d 5: <0.1 kU/L
I211-IgE Ves v 1: 0.22 kU/L — AB
I214-IgE Api m 2: 0.18 kU/L — AB
I215-IgE Api m 3: <0.1 kU/L
I216-IgE Api m 5: 0.12 kU/L — AB
I217-IgE Api m 10: 0.14 kU/L — AB
Tryptase: 8.6 ug/L (ref 2.2–13.2)

## 2023-09-30 LAB — ALLERGEN COMPONENT COMMENTS

## 2023-10-03 ENCOUNTER — Encounter: Payer: Self-pay | Admitting: Allergy

## 2023-10-11 ENCOUNTER — Other Ambulatory Visit: Payer: Self-pay | Admitting: Internal Medicine

## 2023-10-11 DIAGNOSIS — I10 Essential (primary) hypertension: Secondary | ICD-10-CM

## 2023-10-17 NOTE — Progress Notes (Unsigned)
 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 Muriel Arm area of inferobasal HK), Gr 1 DD, NL RVSF, mild pulm HTN, trivial MR, mild AV Ca2+, AV sclerosis, mean AV 7 mmHg, Vmax 171 cm/s, RVSP 38.8 Hypertension  Hyperlipidemia  Diabetes mellitus Chronic kidney disease stage 2  Aortic atherosclerosis  Hemachromatosis  Bradycardia Hx of syncope           Discussed the use of AI scribe software for clinical note transcription with the patient, who gave verbal consent to proceed.  History of Present Illness Logan [name] is a 73 y.o. male who returns for post hospital follow up. He was last seen by Dr. Avanell Bob in 06/2023. He was admitted 4/13-4/14 with anaphylaxis after 10-15 bee stings. He was treated with IM Epinephrine , Benadryl and dexamethasone . He was noted to have EKG abnormalities (inf STE and ant STD on initial EKG; resolved on follow up EKG) and his hsTroponin increased (7>>112>>1531>>3577). He was placed on IV Heparin  and seen by Cardiology. TTE showed normal EF and no new WMA (subtle inferobasal HK noted by Dr. Alvis Ba, unchanged from previous TTE). The pt did not have chest pain. It was felt that he had a small demand myocardial infarction in the setting of anaphylaxis. No ischemic testing was felt to be necessary. He was referred to Allerg/Immunology.   He is here alone.  He has not had chest pain, shortness of breath, or other symptoms suggestive of angina since his hospital discharge. His blood pressure has been  well-controlled, with recent readings around 120/58, which is lower than his usual. His potassium was low during his hospital stay, leading to the addition of spironolactone  to his regimen. He is a long-term beekeeper and has taken measures to avoid future stings, including wearing protective gear and relocating his bees away from his home. He carries two EpiPens for emergencies.     ROS-See HPI        Studies Reviewed:         Results LABS Potassium: 4.7 (09/18/2023) Creatinine: 1.2 (09/18/2023) Total cholesterol: 98 (09/18/2023) HDL: 54 (09/18/2023) Hemoglobin: 15 (09/25/2023)     Risk Assessment/Calculations:             Physical Exam:   VS:  BP 114/60   Pulse 74   Ht 6\' 2"  (1.88 m)   Wt 171 lb 9.6 oz (77.8 kg)   SpO2 98%   BMI 22.03 kg/m    Wt Readings from Last 3 Encounters:  10/18/23 171 lb 9.6 oz (77.8 kg)  09/28/23 167 lb 9.6 oz (76 kg)  09/20/23 171 lb 1 oz (77.6 kg)    Constitutional:      Appearance: Healthy appearance. Not in distress.  Neck:     Vascular: JVD normal.  Pulmonary:     Breath sounds: Normal breath sounds. No wheezing. No rales.  Cardiovascular:     Normal rate. Regular rhythm.     Murmurs: There is  no murmur.  Edema:    Peripheral edema absent.  Abdominal:     Palpations: Abdomen is soft.        Assessment and Plan:  Assessment and Plan Assessment & Plan Coronary artery disease S/p recent type 2 NSTEMI in the setting of anaphylaxis after multiple bee stings. Echocardiogram showed a small area of inferobasal hypokinesis.  This was reviewed by Dr. Alvis Ba during his hospitalization.  It was felt to be unchanged from a previous echocardiogram, with normal ejection fraction.  He has not had chest symptoms suggestive of angina since hospital discharge. No further ischemic testing needed. - Continue aspirin  81 mg twice daily - Continue Crestor  10 mg daily - Follow up in 6 months  Hypertension Blood pressure is well-controlled. Current  readings are optimal.  He is currently taking hydralazine  twice daily instead of the prescribed three times daily due to concerns about hypotension. - Continue amlodipine  10 mg daily - Continue hydralazine  10 mg twice daily - Continue irbesartan  300 mg daily - Continue spironolactone  12.5 mg daily - Obtain basic metabolic panel today  Hyperlipidemia LDL levels are optimal with current treatment regimen. - Continue Crestor  10 mg daily  Hypokalemia Potassium was low in the hospital, leading to the initiation of spironolactone . - Continue spironolactone  12.5 mg daily - Check basic metabolic panel today to recheck potassium levels            Dispo:  Return in about 6 months (around 04/19/2024) for Routine Follow Up, w/ Dr. Avanell Bob.  Signed, Marlyse Single, PA-C

## 2023-10-18 ENCOUNTER — Ambulatory Visit: Attending: Physician Assistant | Admitting: Physician Assistant

## 2023-10-18 ENCOUNTER — Encounter: Payer: Self-pay | Admitting: Physician Assistant

## 2023-10-18 VITALS — BP 114/60 | HR 74 | Ht 74.0 in | Wt 171.6 lb

## 2023-10-18 DIAGNOSIS — E785 Hyperlipidemia, unspecified: Secondary | ICD-10-CM | POA: Insufficient documentation

## 2023-10-18 DIAGNOSIS — I10 Essential (primary) hypertension: Secondary | ICD-10-CM | POA: Insufficient documentation

## 2023-10-18 DIAGNOSIS — I2510 Atherosclerotic heart disease of native coronary artery without angina pectoris: Secondary | ICD-10-CM | POA: Diagnosis present

## 2023-10-18 DIAGNOSIS — E876 Hypokalemia: Secondary | ICD-10-CM | POA: Diagnosis not present

## 2023-10-18 LAB — BASIC METABOLIC PANEL WITH GFR
BUN/Creatinine Ratio: 16 (ref 10–24)
BUN: 21 mg/dL (ref 8–27)
CO2: 24 mmol/L (ref 20–29)
Calcium: 9.7 mg/dL (ref 8.6–10.2)
Chloride: 100 mmol/L (ref 96–106)
Creatinine, Ser: 1.33 mg/dL — ABNORMAL HIGH (ref 0.76–1.27)
Glucose: 58 mg/dL — ABNORMAL LOW (ref 70–99)
Potassium: 4.3 mmol/L (ref 3.5–5.2)
Sodium: 141 mmol/L (ref 134–144)
eGFR: 57 mL/min/{1.73_m2} — ABNORMAL LOW (ref >59)

## 2023-10-18 MED ORDER — SPIRONOLACTONE 25 MG PO TABS: 12.5000 mg | ORAL_TABLET | Freq: Every day | ORAL | 3 refills | Status: DC

## 2023-10-18 NOTE — Patient Instructions (Addendum)
 Medication Instructions:  Your physician recommends that you continue on your current medications as directed. Please refer to the Current Medication list given to you today.  *If you need a refill on your cardiac medications before your next appointment, please call your pharmacy*  Lab Work: Today: BMET   If you have labs (blood work) drawn today and your tests are completely normal, you will receive your results only by: MyChart Message (if you have MyChart) OR A paper copy in the mail If you have any lab test that is abnormal or we need to change your treatment, we will call you to review the results.  Testing/Procedures: None ordered  Follow-Up: At [facility], you and your health needs are our priority.  As part of our continuing mission to provide you with exceptional heart care, our providers are all part of one team.  This team includes your primary Cardiologist (physician) and Advanced Practice Providers or APPs (Physician Assistants and Nurse Practitioners) who all work together to provide you with the care you need, when you need it.  Your next appointment:   6 month(s)  Provider:   Ola Berger, MD    We recommend signing up for the patient portal called "MyChart".  Sign up information is provided on this After Visit Summary.  MyChart is used to connect with patients for Virtual Visits (Telemedicine).  Patients are able to view lab/test results, encounter notes, upcoming appointments, etc.  Non-urgent messages can be sent to your provider as well.   To learn more about what you can do with MyChart, go to [email or web address].   Other Instructions

## 2023-10-19 ENCOUNTER — Ambulatory Visit: Payer: Self-pay | Admitting: Physician Assistant

## 2023-11-03 ENCOUNTER — Encounter: Payer: Self-pay | Admitting: Emergency Medicine

## 2023-11-03 ENCOUNTER — Ambulatory Visit
Admission: EM | Admit: 2023-11-03 | Discharge: 2023-11-03 | Disposition: A | Discharge status: Home / Self Care | Attending: Physician Assistant | Admitting: Physician Assistant

## 2023-11-03 DIAGNOSIS — L089 Local infection of the skin and subcutaneous tissue, unspecified: Secondary | ICD-10-CM

## 2023-11-03 DIAGNOSIS — S80811A Abrasion, right lower leg, initial encounter: Secondary | ICD-10-CM | POA: Diagnosis not present

## 2023-11-03 MED ORDER — MUPIROCIN 2 % EX OINT: 1.0000 | TOPICAL_OINTMENT | Freq: Two times a day (BID) | CUTANEOUS | 0 refills | Status: DC

## 2023-11-03 MED ORDER — SULFAMETHOXAZOLE-TRIMETHOPRIM 800-160 MG PO TABS: 1.0000 | ORAL_TABLET | Freq: Two times a day (BID) | ORAL | 0 refills | Status: AC

## 2023-11-03 MED ORDER — TETANUS-DIPHTH-ACELL PERTUSSIS 5-2.5-18.5 LF-MCG/0.5 IM SUSY
0.5000 mL | PREFILLED_SYRINGE | Freq: Once | INTRAMUSCULAR | Status: AC
  Administered 2023-11-03: 0.5 mL via INTRAMUSCULAR

## 2023-11-03 NOTE — ED Provider Notes (Signed)
 [facility]    CSN: [id number] Arrival date & time: 11/03/23  1311      History   Chief Complaint Chief Complaint  Patient presents with   Wound Check    HPI Logan [name] is a 73 y.o. male.   Patient presents today with a 4-day history of wound to his right lower leg.  He reports that he [place] from the city and this correlated fell and cut his leg.  He has cleaned the area with soap and water and applied Silvadene cream as well as Neosporin.  Over the past few days it has become more red prompting evaluation.  He has a history of type 1 diabetes and so is concerned about appropriate healing of wounds.  He denies any history of MRSA, recurrent skin infections, recent hospitalization, surgical procedure.  He denies any significant pain.  Denies any fever, nausea, vomiting.  His last tetanus was given 01/08/2014.     Past Medical History:  Diagnosis Date   Aortic atherosclerosis (HCC)    Cataract    Coronary artery disease involving native coronary artery of native heart without angina pectoris 11/01/2007   Last Cath '04: LAD w/ 60% long lesion; 1st Dx 70% ostial; 2nd Dx  70% ostial; Cx 30% ostial; RCA-dominant multiple 30-40% lesions; PDA 30% lesions  Last nuclear stress March '09 - negative for ischemia    Diabetes mellitus    Diabetic retinopathy    Hearing loss    Hemochromatosis    Hyperlipidemia    Hypertension    Tubular adenoma of colon     Patient Active Problem List   Diagnosis Date Noted   Bee sting 09/17/2023   Dermatitis venenata 07/13/2022   Screen for colon cancer 07/13/2022   Diabetes mellitus type 1 with atherosclerosis of arteries of extremities (HCC) 07/13/2022   Polyp of colon 11/30/2020   Systolic murmur 11/30/2020   Need for shingles vaccine 11/30/2020   Aortic atherosclerosis (HCC) 10/09/2020   Diabetic retinopathy (HCC) 09/21/2020   Pure hypercholesterolemia 09/21/2020   Chronic renal impairment, stage 2 (mild)  07/03/2018   Nonspecific abnormal electrocardiogram (ECG) (EKG) 12/30/2015   Chronic foot ulcer with fat layer exposed (HCC) 01/19/2015   Diabetic neuropathy (HCC) 01/19/2015   Diabetic ulcer of both feet associated with type 2 diabetes mellitus (HCC) 01/19/2015   BPH (benign prostatic hyperplasia) 08/20/2014   Nuclear sclerosis of both eyes 04/16/2014   Type 1 diabetes mellitus without complication (HCC) 04/16/2014   Hypothyroidism 01/08/2014   Hyperlipidemia LDL goal <70 05/05/2009   Uncontrolled type 1 diabetes mellitus 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 11/01/2007   Hereditary hemochromatosis (HCC) 11/01/2007   Essential hypertension 11/01/2007   Coronary artery disease 11/01/2007    Past Surgical History:  Procedure Laterality Date   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   Procedure: Right Great Toe Amputation;  Surgeon: Timothy Ford, MD;  Location: [facility];  Service: Orthopedics;  Laterality: Right;  Right Great Toe Amputation   INNER EAR SURGERY     right       Home Medications    Prior to Admission medications   Medication Sig Start Date End Date Taking? Authorizing Provider  amLODipine  (NORVASC ) 10 MG tablet TAKE 1 TABLET(10 MG) BY MOUTH DAILY 10/16/23  Yes Arcadio Knuckles, MD  aspirin  81 MG tablet Take 81 mg by mouth 2 (two) times daily.   Yes [provider]  hydrALAZINE  (APRESOLINE ) 10 MG tablet Take 1 tablet (  10 mg total) by mouth 3 (three) times daily. Patient taking differently: Take 10 mg by mouth in the morning and at bedtime. 09/18/23  Yes Lorita Rosa, MD  insulin  degludec (TRESIBA  FLEXTOUCH) 100 UNIT/ML FlexTouch Pen Inject 19 Units into the skin at bedtime.   Yes [provider]  insulin  NPH-regular Human (NOVOLIN  70/30) (70-30) 100 UNIT/ML injection Inject 20 Units into the skin 2 (two) times daily with a meal. 07/13/22  Yes Arcadio Knuckles, MD  irbesartan  (AVAPRO ) 300 MG tablet TAKE 1 TABLET BY MOUTH EVERY DAY 10/16/23  Yes Arcadio Knuckles, MD  levothyroxine  (SYNTHROID ) 88 MCG tablet Take 1 tablet (88 mcg total) by mouth daily before breakfast. 08/25/23  Yes Arcadio Knuckles, MD  mupirocin ointment (BACTROBAN) 2 % Apply 1 Application topically 2 (two) times daily. 11/03/23  Yes Jameia Makris K, PA-C  rosuvastatin  (CRESTOR ) 10 MG tablet Take 1 tablet (10 mg total) by mouth daily. 07/04/23  Yes Elmyra Haggard, MD  spironolactone  (ALDACTONE ) 25 MG tablet Take 0.5 tablets (12.5 mg total) by mouth daily. 10/18/23 11/17/23 Yes Weaver, Scott T, PA-C  sulfamethoxazole-trimethoprim (BACTRIM DS) 800-160 MG tablet Take 1 tablet by mouth 2 (two) times daily for 7 days. 11/03/23 11/10/23 Yes Auden Wettstein, Betsey Brow, PA-C    Family History Family History  Problem Relation Age of Onset   Asthma Mother    Heart failure Mother    Emphysema Mother    Diabetes Father    Alcohol abuse Father    Cancer Father        prostate   Stroke Father    Diabetes Sister    Diabetes Brother    Colon cancer Maternal Uncle        dx'd 33   Blindness Paternal Grandmother    Diabetes Paternal Grandmother    Heart disease Neg Hx    Rectal cancer Neg Hx    Stomach cancer Neg Hx     Social History Social History   Tobacco Use   Smoking status: Never   Smokeless tobacco: Never  Vaping Use   Vaping status: Never Used  Substance Use Topics   Alcohol use: Yes    Comment: occ beer but very rare   Drug use: No     Allergies   Bee venom   Review of Systems Review of Systems  Constitutional:  Positive for activity change. Negative for appetite change, fatigue and fever.  Gastrointestinal:  Negative for abdominal pain, diarrhea, nausea and vomiting.  Musculoskeletal:  Negative for arthralgias and myalgias.  Skin:  Positive for wound. Negative for color change.  Neurological:  Negative for weakness and numbness.     Physical Exam Triage Vital Signs ED Triage Vitals [11/03/23 1326]  Encounter Vitals Group     BP (!) 170/72     Systolic BP Percentile       Diastolic BP Percentile      Pulse Rate 75     Resp 16     Temp 97.6 F (36.4 C)     Temp Source Oral     SpO2 96 %     Weight      Height      Head Circumference      Peak Flow      Pain Score 0     Pain Loc      Pain Education      Exclude from Growth Chart    No data found.  Updated Vital Signs BP (!) 147/73 (  BP Location: Right Arm)   Pulse 79   Temp 97.6 F (36.4 C) (Oral)   Resp 16   SpO2 97%   Visual Acuity Right Eye Distance:   Left Eye Distance:   Bilateral Distance:    Right Eye Near:   Left Eye Near:    Bilateral Near:     Physical Exam Vitals reviewed.  Constitutional:      General: He is awake.     Appearance: Normal appearance. He is well-developed. He is not ill-appearing.     Comments: Very pleasant male appears stated age in no acute distress sitting comfortably in exam room  HENT:     Head: Normocephalic and atraumatic.     Mouth/Throat:     Pharynx: No oropharyngeal exudate, posterior oropharyngeal erythema or uvula swelling.  Cardiovascular:     Rate and Rhythm: Normal rate and regular rhythm.     Heart sounds: Normal heart sounds, S1 normal and S2 normal. No murmur heard. Pulmonary:     Effort: Pulmonary effort is normal.     Breath sounds: Normal breath sounds. No stridor. No wheezing, rhonchi or rales.     Comments: Clear to auscultation bilaterally Skin:    Comments: 3 cm x 1 cm abrasion without drainage surrounded by area of erythema that measures approximately 5 cm x 4 cm.  Neurological:     Mental Status: He is alert.  Psychiatric:        Behavior: Behavior is cooperative.      UC Treatments / Results  Labs (all labs ordered are listed, but only abnormal results are displayed) Labs Reviewed - No data to display  EKG   Radiology No results found.  Procedures Procedures (including critical care time)  Medications Ordered in UC Medications  Tdap (BOOSTRIX) injection 0.5 mL (has no administration in time range)     Initial Impression / Assessment and Plan / UC Course  I have reviewed the triage vital signs and the nursing notes.  Pertinent labs & imaging results that were available during my care of the patient were reviewed by me and considered in my medical decision making (see chart for details).     Patient is well-appearing, afebrile, nontoxic, nontachycardic.  No indication for plain films as patient denies any recent trauma and has no focal bony tenderness.  Abrasion is noted with surrounding erythema concerning for secondary infection.  His last tetanus was approximately 10 years ago so we will update this today.  He was encouraged to keep the area clean and apply Bactroban ointment twice daily with dressing changes.  Will start Bactrim DS twice daily for 7 days.  We discussed that he should avoid the sun while on this medication if you have any rash or lesions to stop the medication to be seen immediately.  Also recommended that he monitor his blood sugar closely.  He can use over-the-counter medication for additional symptom relief.  No indication for dose adjustment based on metabolic panel from 10/18/2023 with creatinine of 1.33 and calculated creatinine clearance of 55.08 mL/min.  We discussed that if anything worsens or changes he needs to be seen immediately.  Strict return precautions given.  All questions answered to patient satisfaction.  Final Clinical Impressions(s) / UC Diagnoses   Final diagnoses:  Infected abrasion of skin of right lower leg     Discharge Instructions      We are treating you for an infection.  Start Bactrim DS twice daily for 7 days.  Stay  out of the sun while on this medication as can cause you to have a sunburn.  If you develop any rash or lesions stop the medication to be seen immediately.  Keep the area clean with soap and water and apply Bactroban ointment.  We updated your tetanus today.  Follow-up with your primary care next week.  If anything changes and  you have any side effects from the medicine, nausea, vomiting, fever, drainage from the wound, spread of the redness you need to be seen immediately.  Monitor your blood sugar closely while on this medication.  ED Prescriptions     Medication Sig Dispense Auth. Provider   mupirocin ointment (BACTROBAN) 2 % Apply 1 Application topically 2 (two) times daily. 22 g Terril Chestnut K, PA-C   sulfamethoxazole-trimethoprim (BACTRIM DS) 800-160 MG tablet Take 1 tablet by mouth 2 (two) times daily for 7 days. 14 tablet Elman Dettman K, PA-C      PDMP not reviewed this encounter.   Budd Cargo, PA-C 11/03/23 1415

## 2023-11-03 NOTE — Discharge Instructions (Signed)
 We are treating you for an infection.  Start Bactrim DS twice daily for 7 days.  Stay out of the sun while on this medication as can cause you to have a sunburn.  If you develop any rash or lesions stop the medication to be seen immediately.  Keep the area clean with soap and water and apply Bactroban ointment.  We updated your tetanus today.  Follow-up with your primary care next week.  If anything changes and you have any side effects from the medicine, nausea, vomiting, fever, drainage from the wound, spread of the redness you need to be seen immediately.  Monitor your blood sugar closely while on this medication.

## 2023-11-03 NOTE — ED Triage Notes (Signed)
 Pt reports injury to bilateral lower legs on Monday (5/26) when he was walking garbage bins up a hill. Bins fell back and the lids scrapped the area just below his R & L knees. Abrasions to the areas. Pt states he has been applying silvadene cream to the areas. He is concerned about infection in abrasion on R leg.

## 2023-11-16 ENCOUNTER — Telehealth: Payer: Self-pay | Admitting: Internal Medicine

## 2023-11-16 NOTE — Telephone Encounter (Signed)
 Unable to reach patient. LMTRC. This medication was refilled last month by his cardiologist office and it was for a year supply.

## 2023-11-16 NOTE — Telephone Encounter (Signed)
 Medication was prescribed by a hospital provider and patient has an appointment scheduled with Dr. Rochelle Chu on 12/26/23. He said the medication treats his blood pressure very well.  Prescription Request  11/16/2023  LOV: 09/28/2023  What is the name of the medication or equipment? spironolactone  (ALDACTONE ) 25 MG tablet   Have you contacted your pharmacy to request a refill? Yes   Which pharmacy would you like this sent to?  [id number] - [place], [place] - [address] AT [address] [place] [address] Phone: [phone] Fax: [phone]    Patient notified that their request is being sent to the clinical staff for review and that they should receive a response within 2 business days.   Please advise at [facility] [phone]

## 2023-11-22 ENCOUNTER — Other Ambulatory Visit: Payer: Self-pay

## 2023-11-22 ENCOUNTER — Ambulatory Visit (INDEPENDENT_AMBULATORY_CARE_PROVIDER_SITE_OTHER)

## 2023-11-22 VITALS — Ht 74.0 in | Wt 171.0 lb

## 2023-11-22 DIAGNOSIS — Z1211 Encounter for screening for malignant neoplasm of colon: Secondary | ICD-10-CM | POA: Diagnosis not present

## 2023-11-22 DIAGNOSIS — Z0000 Encounter for general adult medical examination without abnormal findings: Secondary | ICD-10-CM

## 2023-11-22 DIAGNOSIS — Z1212 Encounter for screening for malignant neoplasm of rectum: Secondary | ICD-10-CM

## 2023-11-22 MED ORDER — SPIRONOLACTONE 25 MG PO TABS: 12.5000 mg | ORAL_TABLET | Freq: Every day | ORAL | 3 refills | Status: AC

## 2023-11-22 NOTE — Progress Notes (Signed)
 Subjective:   Logan [name] is a 73 y.o. who presents for a Medicare Wellness preventive visit.  As a reminder, Annual Wellness Visits don't include a physical exam, and some assessments may be limited, especially if this visit is performed virtually. We may recommend an in-person follow-up visit with your provider if needed.  Visit Complete: Virtual I connected with  Logan [name] on 11/22/23 by a audio enabled telemedicine application and verified that I am speaking with the correct person using two identifiers.  Patient Location: Home  Provider Location: Home Office  I discussed the limitations of evaluation and management by telemedicine. The patient expressed understanding and agreed to proceed.  Vital Signs: Because this visit was a virtual/telehealth visit, some criteria may be missing or patient reported. Any vitals not documented were not able to be obtained and vitals that have been documented are patient reported.  VideoDeclined- This patient declined Librarian, academic. Therefore the visit was completed with audio only.  Persons Participating in Visit: Patient.  AWV Questionnaire: No: Patient Medicare AWV questionnaire was not completed prior to this visit.  Cardiac Risk Factors include: advanced age (>70men, >46 women);hypertension;male gender;diabetes mellitus;Other (see comment), Risk factor comments: Aortic atherosclerosis, CAD,  BPH, Chronic renal impairment stage2     Objective:    Today's Vitals   11/22/23 1044  Weight: 171 lb (77.6 kg)  Height: 6' 2 (1.88 m)   Body mass index is 21.96 kg/m.     11/22/2023   11:02 AM 09/17/2023    3:33 PM 06/21/2022   11:20 AM 08/31/2021    1:05 PM 06/22/2021   10:22 AM 01/19/2021    2:59 PM 03/19/2020   12:42 PM  Advanced Directives  Does Patient Have a Medical Advance Directive? Yes No Yes No;Yes Yes Yes Yes  Type of Estate agent of [place];Living will  Living will  Living will  Living will Healthcare Power of [place];Living will  Does patient want to make changes to medical advance directive?      No - Patient declined No - Patient declined  Copy of Healthcare Power of Attorney in Chart? No - copy requested      No - copy requested  Would patient like information on creating a medical advance directive?  No - Patient declined         Current Medications (verified) Outpatient Encounter Medications as of 11/22/2023  Medication Sig   amLODipine  (NORVASC ) 10 MG tablet TAKE 1 TABLET(10 MG) BY MOUTH DAILY   aspirin  81 MG tablet Take 81 mg by mouth 2 (two) times daily.   hydrALAZINE  (APRESOLINE ) 10 MG tablet Take 1 tablet (10 mg total) by mouth 3 (three) times daily.   insulin  degludec (TRESIBA  FLEXTOUCH) 100 UNIT/ML FlexTouch Pen Inject 19 Units into the skin at bedtime.   insulin  NPH-regular Human (NOVOLIN  70/30) (70-30) 100 UNIT/ML injection Inject 20 Units into the skin 2 (two) times daily with a meal.   irbesartan  (AVAPRO ) 300 MG tablet TAKE 1 TABLET BY MOUTH EVERY DAY   levothyroxine  (SYNTHROID ) 88 MCG tablet Take 1 tablet (88 mcg total) by mouth daily before breakfast.   mupirocin  ointment (BACTROBAN ) 2 % Apply 1 Application topically 2 (two) times daily.   rosuvastatin  (CRESTOR ) 10 MG tablet Take 1 tablet (10 mg total) by mouth daily.   triamterene -hydrochlorothiazide  (DYAZIDE ) 37.5-25 MG capsule Take 1 capsule by mouth daily.   [DISCONTINUED] spironolactone  (ALDACTONE ) 25 MG tablet Take 0.5 tablets (12.5 mg total) by mouth  daily. (Patient not taking: Reported on 11/22/2023)   No facility-administered encounter medications on file as of 11/22/2023.    Allergies (verified) Bee venom   History: Past Medical History:  Diagnosis Date   Aortic atherosclerosis (HCC)    Cataract    Coronary artery disease involving native coronary artery of native heart without angina pectoris 11/01/2007   Last Cath '04: LAD w/ 60% long lesion; 1st Dx 70% ostial; 2nd  Dx  70% ostial; Cx 30% ostial; RCA-dominant multiple 30-40% lesions; PDA 30% lesions  Last nuclear stress March '09 - negative for ischemia    Diabetes mellitus    Diabetic retinopathy    Hearing loss    Hemochromatosis    Hyperlipidemia    Hypertension    Tubular adenoma of colon    Past Surgical History:  Procedure Laterality Date   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   Procedure: Right Great Toe Amputation;  Surgeon: Timothy Ford, MD;  Location: [facility];  Service: Orthopedics;  Laterality: Right;  Right Great Toe Amputation   INNER EAR SURGERY     right   Family History  Problem Relation Age of Onset   Asthma Mother    Heart failure Mother    Emphysema Mother    Diabetes Father    Alcohol abuse Father    Cancer Father        prostate   Stroke Father    Diabetes Sister    Diabetes Brother    Colon cancer Maternal Uncle        dx'd 54   Blindness Paternal Grandmother    Diabetes Paternal Grandmother    Heart disease Neg Hx    Rectal cancer Neg Hx    Stomach cancer Neg Hx    Social History   Socioeconomic History   Marital status: Widowed    Spouse name: Not on file   Number of children: 1   Years of education: 41   Highest education level: Not on file  Occupational History   Occupation: retired    Associate Professor: [employer]  Tobacco Use   Smoking status: Never   Smokeless tobacco: Never  Vaping Use   Vaping status: Never Used  Substance and Sexual Activity   Alcohol use: Yes    Comment: occ beer but very rare   Drug use: No   Sexual activity: Yes    Partners: Female  Other Topics Concern   Not on file  Social History Narrative   HSG. Married - '77 - '04. 1 son '80. Work - semi-retired. Has a girlfriend.   Lives alone/2025   Social Drivers of Health   Financial Resource Strain: Low Risk  (11/22/2023)   Overall Financial Resource Strain (CARDIA)    Difficulty of Paying Living Expenses: Not hard at all  Food Insecurity: No Food Insecurity (11/22/2023)   Hunger  Vital Sign    Worried About Running Out of Food in the Last Year: Never true    Ran Out of Food in the Last Year: Never true  Transportation Needs: No Transportation Needs (11/22/2023)   PRAPARE - Administrator, Civil Service (Medical): No    Lack of Transportation (Non-Medical): No  Physical Activity: Inactive (11/22/2023)   Exercise Vital Sign    Days of Exercise per Week: 0 days    Minutes of Exercise per Session: 0 min  Stress: No Stress Concern Present (11/22/2023)   [employer] of Occupational Health - Occupational Stress Questionnaire    Feeling of Stress:  Not at all  Social Connections: Socially Isolated (11/22/2023)   Social Connection and Isolation Panel    Frequency of Communication with Friends and Family: Three times a week    Frequency of Social Gatherings with Friends and Family: Twice a week    Attends Religious Services: Never    Database administrator or Organizations: No    Attends Banker Meetings: Never    Marital Status: Widowed    Tobacco Counseling Counseling given: Not Answered    Clinical Intake:  Pre-visit preparation completed: Yes  Pain : No/denies pain     BMI - recorded: 21.96 Nutritional Status: BMI of 19-24  Normal Nutritional Risks: None Diabetes: Yes CBG done?: Yes (92 am-152-per pt) CBG resulted in Enter/ Edit results?: No Did pt. bring in CBG monitor from home?: No  Lab Results  Component Value Date   HGBA1C 7.5 (H) 09/18/2023   HGBA1C 7.8 (H) 08/24/2023   HGBA1C 8.00 12/12/2022     How often do you need to have someone help you when you read instructions, pamphlets, or other written materials from your doctor or pharmacy?: 1 - Never  Interpreter Needed?: No  Information entered by :: Rynn Markiewicz, RMA   Activities of Daily Living     11/22/2023   10:48 AM 09/17/2023   11:23 PM  In your present state of health, do you have any difficulty performing the following activities:  Hearing? 1 1   Comment has a tube in lt ear/has hearing issues   Vision? 0 0  Difficulty concentrating or making decisions? 0 0  Walking or climbing stairs? 0   Dressing or bathing? 0   Doing errands, shopping? 0 0  Preparing Food and eating ? N   Using the Toilet? N   In the past six months, have you accidently leaked urine? N   Do you have problems with loss of bowel control? N   Managing your Medications? N   Managing your Finances? N   Housekeeping or managing your Housekeeping? N     Patient Care Team: Arcadio Knuckles, MD as PCP - General (Internal Medicine) Elmyra Haggard, MD as PCP - Cardiology (Cardiology) Ple[place], Patterson Bora, RN as [facility] Care Management  I have updated your Care Teams any recent Medical Services you may have received from other providers in the past year.     Assessment:   This is a routine wellness examination for Logan [name].  Hearing/Vision screen Hearing Screening - Comments:: Denies hearing difficulties   Vision Screening - Comments:: Wears eyeglasses/ Dr. Karyl Paget   Goals Addressed   None    Depression Screen     11/22/2023   11:05 AM 08/24/2023    8:56 AM 10/17/2022   10:45 AM 07/13/2022    8:57 AM 04/01/2021    8:05 AM 03/19/2020   12:38 PM 01/23/2019    2:56 PM  PHQ 2/9 Scores  PHQ - 2 Score 0 0 0 0 0 0 0  PHQ- 9 Score 0          Fall Risk     11/22/2023   11:02 AM 08/24/2023    8:56 AM 10/17/2022   10:45 AM 07/13/2022    8:55 AM 08/03/2021    3:48 PM  Fall Risk   Falls in the past year? 0 0 0 0 0  Number falls in past yr: 0 0 0 0 0  Injury with Fall? 0 0 0 0 0  Risk for  fall due to :  No Fall Risks No Fall Risks No Fall Risks No Fall Risks  Follow up Falls evaluation completed;Falls prevention discussed Falls evaluation completed Falls evaluation completed Falls evaluation completed Falls evaluation completed      Data saved with a previous flowsheet row definition    MEDICARE RISK AT HOME:  Medicare Risk at Home Any stairs in  or around the home?: No If so, are there any without handrails?: No Home free of loose throw rugs in walkways, pet beds, electrical cords, etc?: Yes Adequate lighting in your home to reduce risk of falls?: Yes Life alert?: No Use of a cane, walker or w/c?: No Grab bars in the bathroom?: No Shower chair or bench in shower?: No Elevated toilet seat or a handicapped toilet?: Yes  TIMED UP AND GO:  Was the test performed?  No  Cognitive Function: Declined/Normal: No cognitive concerns noted by patient or family. Patient alert, oriented, able to answer questions appropriately and recall recent events. No signs of memory loss or confusion.        Immunizations Immunization History  Administered Date(s) Administered   Fluad Trivalent(High Dose 65+) 03/28/2023   Influenza Split 05/10/2011, 03/20/2014   Influenza Whole 03/11/2010, 05/21/2012   Influenza, High Dose Seasonal PF 03/29/2016, 04/10/2017, 02/01/2018   Influenza-Unspecified 04/10/2017, 03/05/2020, 03/16/2021   PFIZER(Purple Top)SARS-COV-2 Vaccination 08/02/2019, 08/27/2019, 03/03/2020, 03/16/2021   Pneumococcal Conjugate-13 06/20/2017   Pneumococcal Polysaccharide-23 01/08/2014, 03/29/2016   Tdap 01/08/2014, 11/03/2023   Zoster, Live 04/10/2016    Screening Tests Health Maintenance  Topic Date Due   Zoster Vaccines- Shingrix  (1 of 2) 02/01/2001   Colonoscopy  10/18/2017   COVID-19 Vaccine (5 - 2024-25 season) 02/05/2023   INFLUENZA VACCINE  01/05/2024   HEMOGLOBIN A1C  03/19/2024   OPHTHALMOLOGY EXAM  03/23/2024   FOOT EXAM  04/24/2024   Diabetic kidney evaluation - Urine ACR  08/23/2024   Diabetic kidney evaluation - eGFR measurement  10/17/2024   Medicare Annual Wellness (AWV)  11/21/2024   DTaP/Tdap/Td (3 - Td or Tdap) 11/02/2033   Pneumococcal Vaccine: 50+ Years  Completed   Hepatitis C Screening  Completed   HPV VACCINES  Aged Out   Meningococcal B Vaccine  Aged Out    Health Maintenance  Health  Maintenance Due  Topic Date Due   Zoster Vaccines- Shingrix  (1 of 2) 02/01/2001   Colonoscopy  10/18/2017   COVID-19 Vaccine (5 - 2024-25 season) 02/05/2023   Health Maintenance Items Addressed: See Nurse Notes at the end of this note  Additional Screening:  Vision Screening: Recommended annual ophthalmology exams for early detection of glaucoma and other disorders of the eye. Would you like a referral to an eye doctor? No    Dental Screening: Recommended annual dental exams for proper oral hygiene  Community Resource Referral / Chronic Care Management: CRR required this visit?  No   CCM required this visit?  No   Plan:    I have personally reviewed and noted the following in the patient's chart:   Medical and social history Use of alcohol, tobacco or illicit drugs  Current medications and supplements including opioid prescriptions. Patient is not currently taking opioid prescriptions. Functional ability and status Nutritional status Physical activity Advanced directives List of other physicians Hospitalizations, surgeries, and ER visits in previous 12 months Vitals Screenings to include cognitive, depression, and falls Referrals and appointments  In addition, I have reviewed and discussed with patient certain preventive protocols, quality metrics, and best practice recommendations.  A written personalized care plan for preventive services as well as general preventive health recommendations were provided to patient.   Ikram Riebe L Sena Hoopingarner, CMA   11/22/2023   After Visit Summary: (MyChart) Due to this being a telephonic visit, the after visit summary with patients personalized plan was offered to patient via MyChart   Notes: Patient barely let me ask the questions to do the visit.  He stated that he does not see a point of this visit, he stated that it is a waste of his time.  Patient is due for a colonoscopy but he declines, stating that he was refused because he did not have  a relative to bring him in and that they told him that he could only bring a relative.  So patient stated that he canceled the appointment. He is also due for a Shingles vaccine.  I placed an order for the colonoscopy and told patient to try again.

## 2023-11-22 NOTE — Patient Instructions (Addendum)
 [name] , Thank you for taking time out of your busy schedule to complete your Annual Wellness Visit with me. I enjoyed our conversation and look forward to speaking with you again next year. I, as well as your care team,  appreciate your ongoing commitment to your health goals. Please review the following plan we discussed and let me know if I can assist you in the future. Your Game plan/ To Do List    Referrals: If you haven't heard from the office you've been referred to, please reach out to them at the phone provided.  [facility], [facility] [address], [phone].  Follow up Visits: Next Medicare AWV with our clinical staff: 11/22/2024.   Have you seen your provider in the last 6 months (3 months if uncontrolled diabetes)? Yes Next Office Visit with your provider: 12/26/2023.  Clinician Recommendations:  Aim for 30 minutes of exercise or brisk walking, 6-8 glasses of water, and 5 servings of fruits and vegetables each day. You are due for a Shingles vaccine and can get that at your local pharmacy.      This is a list of the screening recommended for you and due dates:  Health Maintenance  Topic Date Due   Zoster (Shingles) Vaccine (1 of 2) 02/01/2001   Colon Cancer Screening  10/18/2017   COVID-19 Vaccine (5 - 2024-25 season) 02/05/2023   Flu Shot  01/05/2024   Hemoglobin A1C  03/19/2024   Eye exam for diabetics  03/23/2024   Complete foot exam   04/24/2024   Yearly kidney health urinalysis for diabetes  08/23/2024   Yearly kidney function blood test for diabetes  10/17/2024   Medicare Annual Wellness Visit  11/21/2024   DTaP/Tdap/Td vaccine (3 - Td or Tdap) 11/02/2033   Pneumococcal Vaccine for age over 45  Completed   Hepatitis C Screening  Completed   HPV Vaccine  Aged Out   Meningitis B Vaccine  Aged Out    Advanced directives: (Copy Requested) Please bring a copy of your health care power of attorney and living will to  the office to be added to your chart at your convenience. You can mail to [facility] [address] [place], [place] [address] or email to [email or web address] Advance Care Planning is important because it:  [x]  Makes sure you receive the medical care that is consistent with your values, goals, and preferences  [x]  It provides guidance to your family and loved ones and reduces their decisional burden about whether or not they are making the right decisions based on your wishes.  Follow the link provided in your after visit summary or read over the paperwork we have mailed to you to help you started getting your Advance Directives in place. If you need assistance in completing these, please reach out to us  so that we can help you!  See attachments for Preventive Care and Fall Prevention Tips.

## 2023-11-22 NOTE — Telephone Encounter (Signed)
 Patient states he is out of medication.. refill sent in

## 2023-11-23 DIAGNOSIS — Z9622 Myringotomy tube(s) status: Secondary | ICD-10-CM | POA: Diagnosis not present

## 2023-11-23 NOTE — Progress Notes (Shared)
 [facility] - Clinic Note  11/30/2023     CHIEF COMPLAINT Patient presents for No chief complaint on file.   HISTORY OF PRESENT ILLNESS: Logan [name] is a 73 y.o. male who presents to the clinic today for:     Patient states he has not noticed any visual issues. Patient states that his vision varies with blood sugar levels.   Referring physician: Sidra Dredge, MD [address] [place],  [place] [address]  HISTORICAL INFORMATION:   Selected notes from the MEDICAL RECORD NUMBER Referred by Dr. Diedre Fox for concern of myopic degeneration LEE:  Ocular Hx- PMH-    CURRENT MEDICATIONS: No current outpatient medications on file. (Ophthalmic Drugs)   No current facility-administered medications for this visit. (Ophthalmic Drugs)   Current Outpatient Medications (Other)  Medication Sig   amLODipine  (NORVASC ) 10 MG tablet TAKE 1 TABLET(10 MG) BY MOUTH DAILY   aspirin  81 MG tablet Take 81 mg by mouth 2 (two) times daily.   hydrALAZINE  (APRESOLINE ) 10 MG tablet Take 1 tablet (10 mg total) by mouth 3 (three) times daily.   insulin  degludec (TRESIBA  FLEXTOUCH) 100 UNIT/ML FlexTouch Pen Inject 19 Units into the skin at bedtime.   insulin  NPH-regular Human (NOVOLIN  70/30) (70-30) 100 UNIT/ML injection Inject 20 Units into the skin 2 (two) times daily with a meal.   irbesartan  (AVAPRO ) 300 MG tablet TAKE 1 TABLET BY MOUTH EVERY DAY   levothyroxine  (SYNTHROID ) 88 MCG tablet Take 1 tablet (88 mcg total) by mouth daily before breakfast.   mupirocin  ointment (BACTROBAN ) 2 % Apply 1 Application topically 2 (two) times daily.   rosuvastatin  (CRESTOR ) 10 MG tablet Take 1 tablet (10 mg total) by mouth daily.   spironolactone  (ALDACTONE ) 25 MG tablet Take 0.5 tablets (12.5 mg total) by mouth daily.   triamterene -hydrochlorothiazide  (DYAZIDE ) 37.5-25 MG capsule Take 1 capsule by mouth daily.   No current facility-administered medications for this visit. (Other)    REVIEW OF SYSTEMS:   ALLERGIES Allergies  Allergen Reactions   Bee Venom Other (See Comments)    Severe Hypotension     PAST MEDICAL HISTORY Past Medical History:  Diagnosis Date   Aortic atherosclerosis (HCC)    Cataract    Coronary artery disease involving native coronary artery of native heart without angina pectoris 11/01/2007   Last Cath '04: LAD w/ 60% long lesion; 1st Dx 70% ostial; 2nd Dx  70% ostial; Cx 30% ostial; RCA-dominant multiple 30-40% lesions; PDA 30% lesions  Last nuclear stress March '09 - negative for ischemia    Diabetes mellitus    Diabetic retinopathy    Hearing loss    Hemochromatosis    Hyperlipidemia    Hypertension    Tubular adenoma of colon    Past Surgical History:  Procedure Laterality Date   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   Procedure: Right Great Toe Amputation;  Surgeon: Timothy Ford, MD;  Location: [facility];  Service: Orthopedics;  Laterality: Right;  Right Great Toe Amputation   INNER EAR SURGERY     right   FAMILY HISTORY Family History  Problem Relation Age of Onset   Asthma Mother    Heart failure Mother    Emphysema Mother    Diabetes Father    Alcohol abuse Father    Cancer Father        prostate   Stroke Father    Diabetes Sister    Diabetes Brother    Colon cancer Maternal  Uncle        dx'd 67   Blindness Paternal Grandmother    Diabetes Paternal Grandmother    Heart disease Neg Hx    Rectal cancer Neg Hx    Stomach cancer Neg Hx    SOCIAL HISTORY Social History   Tobacco Use   Smoking status: Never   Smokeless tobacco: Never  Vaping Use   Vaping status: Never Used  Substance Use Topics   Alcohol use: Yes    Comment: occ beer but very rare   Drug use: No       OPHTHALMIC EXAM:  Not recorded    IMAGING AND PROCEDURES  Imaging and Procedures for 11/30/2023          ASSESSMENT/PLAN:    ICD-10-CM   1. 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 Essential hypertension  I10     3. Hypertensive retinopathy of both eyes  H35.033     4. Uncomplicated degenerative myopia of both eyes  H44.23     5. Combined forms of age-related cataract of both eyes  H25.813      Moderate nonproliferative diabetic retinopathy w/o DME, both eyes - A1C 9.4 (02.07.24)  - hx of focal laser with Dr. Radames Buff OU (OS>OD) (1984) - exam shows scattered punctate MA, heavy focal laser OU, no NV  - FA (09.29.23) shows OD: Low fluorescein  signal, No NVE, scattered MA with mild leakage; OS: Low fluorescein  signal, No NVE, scattered MA with mild leakage, staining and window defect of focal laser changes temporal macula - OCT without diabetic macular edema, both eyes  - no retinal or ophthalmic interventions indicated or recommended - f/u in 9 mos -- DFE/OCT  2,3. Hypertensive retinopathy OU - discussed importance of tight BP control - monitor  4. Degenerative myopia OU  - no complications - monitor  5. Mixed Cataract OU - The symptoms of cataract, surgical options, and treatments and risks were discussed with patient. - discussed diagnosis and progression - under the expert management of Dr. Diedre Fox - clear from a retina standpoint to proceed with cataract surgery when pt and surgeon are ready   Ophthalmic Meds Ordered this visit:  No orders of the defined types were placed in this encounter.    No follow-ups on file.  There are no Patient Instructions on file for this visit.   Explained the diagnoses, plan, and follow up with the patient and they expressed understanding.  Patient expressed understanding of the importance of proper follow up care.   This document serves as a record of services personally performed by Jeanice Millard, MD, PhD. It was created on their behalf by Morley Arabia. Bevin Bucks, OA an ophthalmic technician. The creation of this record is the provider's dictation and/or activities  during the visit.    Electronically signed by: Morley Arabia. Bevin Bucks, OA 11/23/23 9:19 AM   Jeanice Millard, M.D., Ph.D. Diseases & Surgery of the Retina and Vitreous [facility]     Abbreviations: M myopia (nearsighted); A astigmatism; H hyperopia (farsighted); P presbyopia; Mrx spectacle prescription;  CTL contact lenses; OD right eye; OS left eye; OU both eyes  XT exotropia; ET esotropia; PEK punctate epithelial keratitis; PEE punctate epithelial erosions; DES dry eye syndrome; MGD meibomian gland dysfunction; ATs artificial tears; PFAT's preservative free artificial tears; NSC nuclear sclerotic cataract; PSC posterior subcapsular cataract; ERM epi-retinal membrane;  PVD posterior vitreous detachment; RD retinal detachment; DM diabetes mellitus; DR diabetic retinopathy; NPDR non-proliferative diabetic retinopathy; PDR proliferative diabetic retinopathy; CSME clinically significant macular edema; DME diabetic macular edema; dbh dot blot hemorrhages; CWS cotton wool spot; POAG primary open angle glaucoma; C/D cup-to-disc ratio; HVF humphrey visual field; GVF goldmann visual field; OCT optical coherence tomography; IOP intraocular pressure; BRVO Branch retinal vein occlusion; CRVO central retinal vein occlusion; CRAO central retinal artery occlusion; BRAO branch retinal artery occlusion; RT retinal tear; SB scleral buckle; PPV pars plana vitrectomy; VH Vitreous hemorrhage; PRP panretinal laser photocoagulation; IVK intravitreal kenalog; VMT vitreomacular traction; MH Macular hole;  NVD neovascularization of the disc; NVE neovascularization elsewhere; AREDS age related eye disease study; ARMD age related macular degeneration; POAG primary open angle glaucoma; EBMD epithelial/anterior basement membrane dystrophy; ACIOL anterior chamber intraocular lens; IOL intraocular lens; PCIOL posterior chamber intraocular lens; Phaco/IOL phacoemulsification with intraocular lens placement; PRK  photorefractive keratectomy; LASIK laser assisted in situ keratomileusis; HTN hypertension; DM diabetes mellitus; COPD chronic obstructive pulmonary disease

## 2023-11-30 ENCOUNTER — Ambulatory Visit (INDEPENDENT_AMBULATORY_CARE_PROVIDER_SITE_OTHER): Payer: Medicare Other | Admitting: Ophthalmology

## 2023-11-30 ENCOUNTER — Encounter (INDEPENDENT_AMBULATORY_CARE_PROVIDER_SITE_OTHER): Payer: Self-pay | Admitting: Ophthalmology

## 2023-11-30 DIAGNOSIS — E113393 Type 2 diabetes mellitus with moderate nonproliferative diabetic retinopathy without macular edema, bilateral: Secondary | ICD-10-CM

## 2023-11-30 DIAGNOSIS — I10 Essential (primary) hypertension: Secondary | ICD-10-CM | POA: Diagnosis not present

## 2023-11-30 DIAGNOSIS — H4423 Degenerative myopia, bilateral: Secondary | ICD-10-CM | POA: Diagnosis not present

## 2023-11-30 DIAGNOSIS — H35033 Hypertensive retinopathy, bilateral: Secondary | ICD-10-CM

## 2023-11-30 DIAGNOSIS — H25813 Combined forms of age-related cataract, bilateral: Secondary | ICD-10-CM

## 2023-12-25 ENCOUNTER — Other Ambulatory Visit: Payer: Self-pay | Admitting: Physician Assistant

## 2023-12-26 ENCOUNTER — Inpatient Hospital Stay (HOSPITAL_BASED_OUTPATIENT_CLINIC_OR_DEPARTMENT_OTHER): Payer: Medicare Other | Admitting: Physician Assistant

## 2023-12-26 ENCOUNTER — Ambulatory Visit: Admitting: Internal Medicine

## 2023-12-26 ENCOUNTER — Inpatient Hospital Stay: Payer: Medicare Other | Attending: Physician Assistant

## 2023-12-26 ENCOUNTER — Encounter: Payer: Self-pay | Admitting: Internal Medicine

## 2023-12-26 VITALS — BP 130/70 | HR 69 | Temp 97.8°F | Ht 74.0 in | Wt 174.4 lb

## 2023-12-26 DIAGNOSIS — E109 Type 1 diabetes mellitus without complications: Secondary | ICD-10-CM

## 2023-12-26 DIAGNOSIS — E1051 Type 1 diabetes mellitus with diabetic peripheral angiopathy without gangrene: Secondary | ICD-10-CM

## 2023-12-26 DIAGNOSIS — E7800 Pure hypercholesterolemia, unspecified: Secondary | ICD-10-CM | POA: Diagnosis not present

## 2023-12-26 DIAGNOSIS — E039 Hypothyroidism, unspecified: Secondary | ICD-10-CM | POA: Diagnosis not present

## 2023-12-26 DIAGNOSIS — I70209 Unspecified atherosclerosis of native arteries of extremities, unspecified extremity: Secondary | ICD-10-CM | POA: Diagnosis not present

## 2023-12-26 DIAGNOSIS — Z800 Family history of malignant neoplasm of digestive organs: Secondary | ICD-10-CM | POA: Insufficient documentation

## 2023-12-26 DIAGNOSIS — I10 Essential (primary) hypertension: Secondary | ICD-10-CM

## 2023-12-26 DIAGNOSIS — N1831 Chronic kidney disease, stage 3a: Secondary | ICD-10-CM

## 2023-12-26 DIAGNOSIS — K635 Polyp of colon: Secondary | ICD-10-CM

## 2023-12-26 LAB — CBC WITH DIFFERENTIAL (CANCER CENTER ONLY)
Abs Immature Granulocytes: 0.01 K/uL (ref 0.00–0.07)
Basophils Absolute: 0.1 K/uL (ref 0.0–0.1)
Basophils Relative: 1 %
Eosinophils Absolute: 0.4 K/uL (ref 0.0–0.5)
Eosinophils Relative: 5 %
HCT: 43.8 % (ref 39.0–52.0)
Hemoglobin: 15.1 g/dL (ref 13.0–17.0)
Immature Granulocytes: 0 %
Lymphocytes Relative: 18 %
Lymphs Abs: 1.3 K/uL (ref 0.7–4.0)
MCH: 31.9 pg (ref 26.0–34.0)
MCHC: 34.5 g/dL (ref 30.0–36.0)
MCV: 92.4 fL (ref 80.0–100.0)
Monocytes Absolute: 0.9 K/uL (ref 0.1–1.0)
Monocytes Relative: 12 %
Neutro Abs: 4.7 K/uL (ref 1.7–7.7)
Neutrophils Relative %: 64 %
Platelet Count: 235 K/uL (ref 150–400)
RBC: 4.74 MIL/uL (ref 4.22–5.81)
RDW: 13.6 % (ref 11.5–15.5)
WBC Count: 7.4 K/uL (ref 4.0–10.5)
nRBC: 0 % (ref 0.0–0.2)

## 2023-12-26 LAB — IRON AND IRON BINDING CAPACITY (CC-WL,HP ONLY)
Iron: 187 ug/dL — ABNORMAL HIGH (ref 45–182)
Saturation Ratios: 83 % — ABNORMAL HIGH (ref 17.9–39.5)
TIBC: 227 ug/dL — ABNORMAL LOW (ref 250–450)
UIBC: 40 ug/dL — ABNORMAL LOW (ref 117–376)

## 2023-12-26 LAB — CMP (CANCER CENTER ONLY)
ALT: 34 U/L (ref 0–44)
AST: 32 U/L (ref 15–41)
Albumin: 4.2 g/dL (ref 3.5–5.0)
Alkaline Phosphatase: 126 U/L (ref 38–126)
Anion gap: 4 — ABNORMAL LOW (ref 5–15)
BUN: 25 mg/dL — ABNORMAL HIGH (ref 8–23)
CO2: 30 mmol/L (ref 22–32)
Calcium: 9.4 mg/dL (ref 8.9–10.3)
Chloride: 105 mmol/L (ref 98–111)
Creatinine: 1.46 mg/dL — ABNORMAL HIGH (ref 0.61–1.24)
GFR, Estimated: 51 mL/min — ABNORMAL LOW (ref >60)
Glucose, Bld: 96 mg/dL (ref 70–99)
Potassium: 4.6 mmol/L (ref 3.5–5.1)
Sodium: 139 mmol/L (ref 135–145)
Total Bilirubin: 1.1 mg/dL (ref 0.0–1.2)
Total Protein: 7.2 g/dL (ref 6.5–8.1)

## 2023-12-26 LAB — FERRITIN: Ferritin: 201 ng/mL (ref 24–336)

## 2023-12-26 NOTE — Progress Notes (Signed)
 Subjective:  Patient ID: Logan [name], male    DOB: 09-12-1950  Age: 73 y.o. MRN: [id number]  CC: Hypertension, Hypothyroidism, and Diabetes   HPI Logan [name] presents for f/up -----  Discussed the use of AI scribe software for clinical note transcription with the patient, who gave verbal consent to proceed.  History of Present Illness Logan [name] is a 73 year old male with diabetes who presents for follow-up of his blood sugar control.  His blood sugar levels have averaged 128 mg/dL over the past week, 853 mg/dL over the past two weeks, and 158 mg/dL over the past ninety days. He recently had blood work done, including an A1c test, and is scheduled to review the results in a week.  He is not experiencing significant swelling. He reports that he can feel touch on the bottom side of his feet. No ongoing pain.  No chest pain or shortness of breath. He had a recent visit to the heart center since his last appointment in April, where he was seen by an assistant and a doctor, although specific details of that visit are not provided.    Outpatient Medications Prior to Visit  Medication Sig Dispense Refill   amLODipine  (NORVASC ) 10 MG tablet TAKE 1 TABLET(10 MG) BY MOUTH DAILY 90 tablet 0   aspirin  81 MG tablet Take 81 mg by mouth 2 (two) times daily.     EPINEPHrine  0.3 MG/0.3ML SOSY Inject as directed as needed (Anaphylaxis).     famotidine  (PEPCID ) 20 MG tablet Take 20 mg by mouth as needed (Allergy  symptoms).     hydrALAZINE  (APRESOLINE ) 10 MG tablet Take 1 tablet (10 mg total) by mouth 3 (three) times daily. 180 tablet 3   insulin  degludec (TRESIBA  FLEXTOUCH) 100 UNIT/ML FlexTouch Pen Inject 19 Units into the skin at bedtime.     insulin  NPH-regular Human (NOVOLIN  70/30) (70-30) 100 UNIT/ML injection Inject 20 Units into the skin 2 (two) times daily with a meal. 48 mL 0   irbesartan  (AVAPRO ) 300 MG tablet TAKE 1 TABLET BY MOUTH EVERY DAY 90 tablet 0   levothyroxine   (SYNTHROID ) 88 MCG tablet Take 1 tablet (88 mcg total) by mouth daily before breakfast. 90 tablet 1   rosuvastatin  (CRESTOR ) 10 MG tablet Take 1 tablet (10 mg total) by mouth daily. 90 tablet 3   spironolactone  (ALDACTONE ) 25 MG tablet Take 0.5 tablets (12.5 mg total) by mouth daily. 45 tablet 3   triamterene -hydrochlorothiazide  (DYAZIDE ) 37.5-25 MG capsule Take 1 capsule by mouth daily.     mupirocin  ointment (BACTROBAN ) 2 % Apply 1 Application topically 2 (two) times daily. (Patient not taking: Reported on 11/30/2023) 22 g 0   No facility-administered medications prior to visit.    ROS Review of Systems  Constitutional:  Negative for appetite change, chills, diaphoresis and fatigue.  HENT: Negative.    Eyes: Negative.   Respiratory: Negative.  Negative for cough, chest tightness and wheezing.   Cardiovascular:  Negative for chest pain, palpitations and leg swelling.  Gastrointestinal:  Negative for abdominal pain, diarrhea, nausea and vomiting.  Endocrine: Negative.  Negative for polydipsia, polyphagia and polyuria.  Genitourinary: Negative.  Negative for difficulty urinating.  Musculoskeletal: Negative.  Negative for myalgias.  Skin: Negative.   Neurological: Negative.   Hematological:  Negative for adenopathy. Does not bruise/bleed easily.  Psychiatric/Behavioral: Negative.      Objective:  BP 130/70 (BP Location: Left Arm, Patient Position: Sitting, Cuff Size: Normal)   Pulse  69   Temp 97.8 F (36.6 C) (Oral)   Ht 6' 2 (1.88 m)   Wt 174 lb 6.4 oz (79.1 kg)   SpO2 99%   BMI 22.39 kg/m   BP Readings from Last 3 Encounters:  12/26/23 130/70  11/03/23 (!) 147/73  10/18/23 114/60    Wt Readings from Last 3 Encounters:  12/26/23 174 lb 6.4 oz (79.1 kg)  11/22/23 171 lb (77.6 kg)  10/18/23 171 lb 9.6 oz (77.8 kg)    Physical Exam Vitals reviewed.  Constitutional:      Appearance: Normal appearance.  HENT:     Mouth/Throat:     Mouth: Mucous membranes are moist.   Eyes:     General: No scleral icterus.    Conjunctiva/sclera: Conjunctivae normal.  Cardiovascular:     Rate and Rhythm: Normal rate and regular rhythm.     Heart sounds: Murmur heard.     No gallop.  Pulmonary:     Effort: Pulmonary effort is normal.     Breath sounds: No stridor. No wheezing, rhonchi or rales.  Abdominal:     General: Abdomen is flat.     Palpations: There is no mass.     Tenderness: There is no abdominal tenderness. There is no guarding.     Hernia: No hernia is present.  Musculoskeletal:     Cervical back: Neck supple.     Right lower leg: No edema.     Left lower leg: No edema.  Lymphadenopathy:     Cervical: No cervical adenopathy.  Skin:    General: Skin is warm and dry.     Findings: No erythema or rash.  Neurological:     General: No focal deficit present.     Mental Status: He is alert.  Psychiatric:        Mood and Affect: Mood normal.        Behavior: Behavior normal.     Lab Results  Component Value Date   WBC 7.4 12/26/2023   HGB 15.1 12/26/2023   HCT 43.8 12/26/2023   PLT 235 12/26/2023   GLUCOSE 96 12/26/2023   CHOL 98 09/18/2023   TRIG 22 09/18/2023   HDL 54 09/18/2023   LDLCALC 40 09/18/2023   ALT 34 12/26/2023   AST 32 12/26/2023   NA 139 12/26/2023   K 4.6 12/26/2023   CL 105 12/26/2023   CREATININE 1.46 (H) 12/26/2023   BUN 25 (H) 12/26/2023   CO2 30 12/26/2023   TSH 2.23 08/24/2023   PSA 3.54 04/25/2023   HGBA1C 7.5 (H) 09/18/2023   MICROALBUR 24.3 (H) 08/24/2023    No results found.  Assessment & Plan:  Polyp of colon, unspecified part of colon, unspecified type -     Ambulatory referral to Gastroenterology  Type 1 diabetes mellitus without complication (HCC)- A1C is pending. -     HM Diabetes Foot Exam  Essential hypertension- BP is well controlled.  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  Stage 3a chronic kidney disease (HCC)- Will avoid  nephrotoxic agents      Follow-up: No follow-ups on file.  Debby Molt, MD

## 2023-12-26 NOTE — Progress Notes (Signed)
 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 Genetic testing shows patient is homozygous for C282Y 01/13/2021: establish care with Dr. Federico  01/19/2021: Ferritin 129, Hgb 14.6 02/02/2021: Ferritin 78, Hgb 13.5 02/16/2021: Ferritin 53, Hgb 13.6 03/03/2022: last phlebotomy. Subsequent HELD as patient was at target.  06/22/2021: ferritin 36, Hgb 16.3 09/20/2021: ferritin 57, Hgb 15.5  Interval History:  Logan [name] 73 y.o. male with medical history significant for hereditary hemachromatosis presents for a follow up visit. The patient's last visit was on 06/26/2023. In the interim since the last visit he has not required any further phlebotomy.  On exam today Logan [name] reports he has been well overall with interim since her last visit. He denies any changes to his appetite or energy levels. He is able to complete all his ADLs on his own. He denies any nausea, vomiting or bowel habit changes. He denies easy bruising or signs of bleeding. He denies any fevers,chills, sweats, shortness of breath, chest pain or cough. He has no other complaints.    A full 10 point ROS is listed below.  MEDICAL HISTORY:  Past Medical History:  Diagnosis Date   Aortic atherosclerosis (HCC)    Cataract    Coronary artery disease involving native coronary artery of native heart without angina pectoris 11/01/2007   Last Cath '04: LAD w/ 60% long lesion; 1st Dx 70% ostial; 2nd Dx  70% ostial; Cx 30% ostial; RCA-dominant multiple 30-40% lesions; PDA 30% lesions  Last nuclear stress March '09 - negative for ischemia    Diabetes mellitus    Diabetic  retinopathy    Hearing loss    Hemochromatosis    Hyperlipidemia    Hypertension    Tubular adenoma of colon     SURGICAL HISTORY: Past Surgical History:  Procedure Laterality Date   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   Procedure: Right Great Toe Amputation;  Surgeon: Jerona [name] Sage, MD;  Location: [facility];  Service: Orthopedics;  Laterality: Right;  Right Great Toe Amputation   INNER EAR SURGERY     right    SOCIAL HISTORY: Social History   Socioeconomic History   Marital status: Widowed    Spouse name: Not on file   Number of children: 1   Years of education: 52   Highest education level: Not on file  Occupational History   Occupation: retired    Associate Professor: [employer]  Tobacco Use   Smoking status: Never   Smokeless tobacco: Never  Vaping Use   Vaping status: Never Used  Substance and Sexual Activity   Alcohol use: Yes    Comment: occ beer but very rare   Drug use: No   Sexual activity: Yes    Partners: Female  Other Topics Concern   Not on file  Social History Narrative   HSG. Married - '77 - '04. 1 son '80. Work - semi-retired. Has a girlfriend.   Lives alone/2025   Social Drivers of Health   Financial Resource Strain: Low Risk  (11/22/2023)   Overall Financial Resource Strain (CARDIA)    Difficulty of Paying Living Expenses: Not hard at all  Food Insecurity: No Food Insecurity (11/22/2023)   Hunger Vital Sign  Worried About Programme researcher, broadcasting/film/video in the Last Year: Never true    Ran Out of Food in the Last Year: Never true  Transportation Needs: No Transportation Needs (11/22/2023)   PRAPARE - Administrator, Civil Service (Medical): No    Lack of Transportation (Non-Medical): No  Physical Activity: Inactive (11/22/2023)   Exercise Vital Sign    Days of Exercise per Week: 0 days    Minutes of Exercise per Session: 0 min  Stress: No Stress Concern Present (11/22/2023)   [employer] of Occupational Health - Occupational Stress Questionnaire     Feeling of Stress: Not at all  Social Connections: Socially Isolated (11/22/2023)   Social Connection and Isolation Panel    Frequency of Communication with Friends and Family: Three times a week    Frequency of Social Gatherings with Friends and Family: Twice a week    Attends Religious Services: Never    Database administrator or Organizations: No    Attends Banker Meetings: Never    Marital Status: Widowed  Intimate Partner Violence: Patient Unable To Answer (11/22/2023)   Humiliation, Afraid, Rape, and Kick questionnaire    Fear of Current or Ex-Partner: Patient unable to answer    Emotionally Abused: Patient unable to answer    Physically Abused: Patient unable to answer    Sexually Abused: Patient unable to answer    FAMILY HISTORY: Family History  Problem Relation Age of Onset   Asthma Mother    Heart failure Mother    Emphysema Mother    Diabetes Father    Alcohol abuse Father    Cancer Father        prostate   Stroke Father    Diabetes Sister    Diabetes Brother    Colon cancer Maternal Uncle        dx'd 29   Blindness Paternal Grandmother    Diabetes Paternal Grandmother    Heart disease Neg Hx    Rectal cancer Neg Hx    Stomach cancer Neg Hx     ALLERGIES:  is allergic to bee venom and strawberry extract.  MEDICATIONS:  Current Outpatient Medications  Medication Sig Dispense Refill   amLODipine  (NORVASC ) 10 MG tablet TAKE 1 TABLET(10 MG) BY MOUTH DAILY 90 tablet 0   aspirin  81 MG tablet Take 81 mg by mouth 2 (two) times daily.     EPINEPHrine  0.3 MG/0.3ML SOSY Inject as directed as needed (Anaphylaxis).     famotidine  (PEPCID ) 20 MG tablet Take 20 mg by mouth as needed (Allergy  symptoms).     hydrALAZINE  (APRESOLINE ) 10 MG tablet Take 1 tablet (10 mg total) by mouth 3 (three) times daily. 180 tablet 3   insulin  degludec (TRESIBA  FLEXTOUCH) 100 UNIT/ML FlexTouch Pen Inject 19 Units into the skin at bedtime.     insulin  NPH-regular Human (NOVOLIN   70/30) (70-30) 100 UNIT/ML injection Inject 20 Units into the skin 2 (two) times daily with a meal. 48 mL 0   irbesartan  (AVAPRO ) 300 MG tablet TAKE 1 TABLET BY MOUTH EVERY DAY 90 tablet 0   levothyroxine  (SYNTHROID ) 88 MCG tablet Take 1 tablet (88 mcg total) by mouth daily before breakfast. 90 tablet 1   rosuvastatin  (CRESTOR ) 10 MG tablet Take 1 tablet (10 mg total) by mouth daily. 90 tablet 3   spironolactone  (ALDACTONE ) 25 MG tablet Take 0.5 tablets (12.5 mg total) by mouth daily. 45 tablet 3   triamterene -hydrochlorothiazide  (DYAZIDE ) 37.5-25 MG capsule Take 1  capsule by mouth daily.     No current facility-administered medications for this visit.    REVIEW OF SYSTEMS:   Constitutional: ( - ) fevers, ( - )  chills , ( - ) night sweats Eyes: ( - ) blurriness of vision, ( - ) double vision, ( - ) watery eyes Ears, nose, mouth, throat, and face: ( - ) mucositis, ( - ) sore throat Respiratory: ( - ) cough, ( - ) dyspnea, ( - ) wheezes Cardiovascular: ( - ) palpitation, ( - ) chest discomfort, ( - ) lower extremity swelling Gastrointestinal:  ( - ) nausea, ( - ) heartburn, ( - ) change in bowel habits Skin: ( - ) abnormal skin rashes Lymphatics: ( - ) new lymphadenopathy, ( - ) easy bruising Neurological: ( - ) numbness, ( - ) tingling, ( - ) new weaknesses Behavioral/Psych: ( - ) mood change, ( - ) new changes  All other systems were reviewed with the patient and are negative.  PHYSICAL EXAMINATION: ECOG PERFORMANCE STATUS: 0 - Asymptomatic  There were no vitals filed for this visit.   There were no vitals filed for this visit.    GENERAL: Well-appearing elderly Caucasian male, alert, no distress and comfortable SKIN: skin color, texture, turgor are normal, no rashes or significant lesions EYES: conjunctiva are pink and non-injected, sclera clear LUNGS: clear to auscultation and percussion with normal breathing effort HEART: regular rate & rhythm and no murmurs and no lower  extremity edema. Musculoskeletal: no cyanosis of digits and no clubbing  PSYCH: alert & oriented x 3, fluent speech NEURO: no focal motor/sensory deficits  LABORATORY DATA:  I have reviewed the data as listed    Latest Ref Rng & Units 12/26/2023   10:37 AM 09/25/2023   10:45 AM 09/17/2023    3:35 PM  CBC  WBC 4.0 - 10.5 K/uL 7.4  6.8  12.3   Hemoglobin 13.0 - 17.0 g/dL 84.8  84.9  84.8   Hematocrit 39.0 - 52.0 % 43.8  43.3  46.8   Platelets 150 - 400 K/uL 235  200  199        Latest Ref Rng & Units 12/26/2023   10:37 AM 10/18/2023    9:07 AM 09/18/2023    5:35 AM  CMP  Glucose 70 - 99 mg/dL 96  58  766   BUN 8 - 23 mg/dL 25  21  21    Creatinine 0.61 - 1.24 mg/dL 8.53  8.66  8.79   Sodium 135 - 145 mmol/L 139  141  138   Potassium 3.5 - 5.1 mmol/L 4.6  4.3  4.7   Chloride 98 - 111 mmol/L 105  100  106   CO2 22 - 32 mmol/L 30  24  23    Calcium  8.9 - 10.3 mg/dL 9.4  9.7  8.8   Total Protein 6.5 - 8.1 g/dL 7.2     Total Bilirubin 0.0 - 1.2 mg/dL 1.1     Alkaline Phos 38 - 126 U/L 126     AST 15 - 41 U/L 32     ALT 0 - 44 U/L 34      RADIOGRAPHIC STUDIES: I have personally reviewed the radiological images as listed and agreed with the findings in the report. OCT, Retina - OU - Both Eyes Result Date: 11/30/2023 Right Eye Quality was good. Central Foveal Thickness: 254. Progression has been stable. Findings include no IRF, no SRF, abnormal foveal contour, myopic contour, retinal drusen ,  subretinal hyper-reflective material, pigment epithelial detachment, outer retinal atrophy, vitreomacular adhesion (Scattered focal laser changes (ORA, SRHM), no DME). Left Eye Quality was good. Central Foveal Thickness: 299. Progression has been stable. Findings include no IRF, no SRF, abnormal foveal contour, myopic contour, retinal drusen , subretinal hyper-reflective material, outer retinal atrophy, vitreomacular adhesion (Scattered focal laser changes (ORA, SRHM), no DME). Notes *Images captured and  stored on drive Diagnosis / Impression: No IRF/SRF OU -- no DME OU Scattered focal laser changes (ORA, SRHM) OU Clinical management: See below Abbreviations: NFP - Normal foveal profile. CME - cystoid macular edema. PED - pigment epithelial detachment. IRF - intraretinal fluid. SRF - subretinal fluid. EZ - ellipsoid zone. ERM - epiretinal membrane. ORA - outer retinal atrophy. ORT - outer retinal tubulation. SRHM - subretinal hyper-reflective material. IRHM - intraretinal hyper-reflective material    ASSESSMENT & PLAN Logan [name] is a 73 y.o.  male with medical history significant for hereditary hemachromatosis presents for a follow up visit.  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 --Since ferritin levels are  >150, will start phlebotomy every 2 weeks until patient reaches target ferritin of <50 --RTC in 3 months for labs   Orders Placed This Encounter  Procedures   CBC with Differential (Cancer Center Only)    Standing Status:   Standing    Number of Occurrences:   10    Expiration Date:   12/25/2024   Ferritin    Standing Status:   Standing    Number of Occurrences:   10    Expiration Date:   12/25/2024    All questions were answered. The patient knows to call the clinic with any problems, questions or concerns.  A total of more than 25 minutes were spent on this encounter with face-to-face time and non-face-to-face time, including preparing to see the patient, ordering tests and/or medications, counseling the patient and coordination of care as outlined above.   Johnston Police PA-C Dept of Hematology and Oncology [facility] Phone: [phone]   12/26/2023 3:22 PM

## 2023-12-27 ENCOUNTER — Other Ambulatory Visit: Payer: Self-pay | Admitting: Internal Medicine

## 2023-12-27 DIAGNOSIS — I10 Essential (primary) hypertension: Secondary | ICD-10-CM

## 2024-01-02 ENCOUNTER — Ambulatory Visit: Payer: Self-pay

## 2024-01-02 DIAGNOSIS — E039 Hypothyroidism, unspecified: Secondary | ICD-10-CM | POA: Diagnosis not present

## 2024-01-02 DIAGNOSIS — I2510 Atherosclerotic heart disease of native coronary artery without angina pectoris: Secondary | ICD-10-CM | POA: Diagnosis not present

## 2024-01-02 DIAGNOSIS — E109 Type 1 diabetes mellitus without complications: Secondary | ICD-10-CM | POA: Diagnosis not present

## 2024-01-02 DIAGNOSIS — E7800 Pure hypercholesterolemia, unspecified: Secondary | ICD-10-CM | POA: Diagnosis not present

## 2024-01-02 DIAGNOSIS — I10 Essential (primary) hypertension: Secondary | ICD-10-CM | POA: Diagnosis not present

## 2024-01-02 DIAGNOSIS — E1140 Type 2 diabetes mellitus with diabetic neuropathy, unspecified: Secondary | ICD-10-CM | POA: Diagnosis not present

## 2024-01-02 DIAGNOSIS — E11319 Type 2 diabetes mellitus with unspecified diabetic retinopathy without macular edema: Secondary | ICD-10-CM | POA: Diagnosis not present

## 2024-01-02 NOTE — Telephone Encounter (Signed)
-----   Message from [place] Police sent at 12/26/2023  3:14 PM EDT ----- Please notify patient that ferritin levels are greater than 150 so we will start phlebotomies every 2 weeks until ferritin is <50 ----- Message ----- From: Interface, Lab In [facility] Sent: 12/26/2023  10:52 AM EDT To: [place] Police, PA-C

## 2024-01-02 NOTE — Telephone Encounter (Signed)
 Pt was already aware and has his first appt 8/1

## 2024-01-05 ENCOUNTER — Inpatient Hospital Stay

## 2024-01-05 ENCOUNTER — Inpatient Hospital Stay: Attending: Hematology and Oncology

## 2024-01-05 DIAGNOSIS — Z79899 Other long term (current) drug therapy: Secondary | ICD-10-CM | POA: Diagnosis not present

## 2024-01-05 LAB — CBC WITH DIFFERENTIAL (CANCER CENTER ONLY)
Abs Immature Granulocytes: 0.01 K/uL (ref 0.00–0.07)
Basophils Absolute: 0.1 K/uL (ref 0.0–0.1)
Basophils Relative: 1 %
Eosinophils Absolute: 0.5 K/uL (ref 0.0–0.5)
Eosinophils Relative: 7 %
HCT: 42.5 % (ref 39.0–52.0)
Hemoglobin: 14.7 g/dL (ref 13.0–17.0)
Immature Granulocytes: 0 %
Lymphocytes Relative: 39 %
Lymphs Abs: 2.9 K/uL (ref 0.7–4.0)
MCH: 32.1 pg (ref 26.0–34.0)
MCHC: 34.6 g/dL (ref 30.0–36.0)
MCV: 92.8 fL (ref 80.0–100.0)
Monocytes Absolute: 0.8 K/uL (ref 0.1–1.0)
Monocytes Relative: 10 %
Neutro Abs: 3.2 K/uL (ref 1.7–7.7)
Neutrophils Relative %: 43 %
Platelet Count: 254 K/uL (ref 150–400)
RBC: 4.58 MIL/uL (ref 4.22–5.81)
RDW: 13.4 % (ref 11.5–15.5)
WBC Count: 7.4 K/uL (ref 4.0–10.5)
nRBC: 0 % (ref 0.0–0.2)

## 2024-01-05 LAB — FERRITIN: Ferritin: 112 ng/mL (ref 24–336)

## 2024-01-05 NOTE — Patient Instructions (Signed)

## 2024-01-05 NOTE — Progress Notes (Signed)
 Logan [name] Staple presents today for phlebotomy per MD orders. Phlebotomy procedure started at 1544 and ended at 1549. 511 grams removed. Patient observed for 30 minutes after procedure without any incident. Patient tolerated procedure well. IV needle removed intact.

## 2024-01-19 ENCOUNTER — Inpatient Hospital Stay

## 2024-01-19 DIAGNOSIS — Z79899 Other long term (current) drug therapy: Secondary | ICD-10-CM | POA: Diagnosis not present

## 2024-01-19 LAB — CBC WITH DIFFERENTIAL (CANCER CENTER ONLY)
Abs Immature Granulocytes: 0.01 K/uL (ref 0.00–0.07)
Basophils Absolute: 0.1 K/uL (ref 0.0–0.1)
Basophils Relative: 1 %
Eosinophils Absolute: 0.3 K/uL (ref 0.0–0.5)
Eosinophils Relative: 5 %
HCT: 34.2 % — ABNORMAL LOW (ref 39.0–52.0)
Hemoglobin: 12 g/dL — ABNORMAL LOW (ref 13.0–17.0)
Immature Granulocytes: 0 %
Lymphocytes Relative: 31 %
Lymphs Abs: 2.1 K/uL (ref 0.7–4.0)
MCH: 32.7 pg (ref 26.0–34.0)
MCHC: 35.1 g/dL (ref 30.0–36.0)
MCV: 93.2 fL (ref 80.0–100.0)
Monocytes Absolute: 0.6 K/uL (ref 0.1–1.0)
Monocytes Relative: 9 %
Neutro Abs: 3.8 K/uL (ref 1.7–7.7)
Neutrophils Relative %: 54 %
Platelet Count: 217 K/uL (ref 150–400)
RBC: 3.67 MIL/uL — ABNORMAL LOW (ref 4.22–5.81)
RDW: 13.4 % (ref 11.5–15.5)
WBC Count: 6.9 K/uL (ref 4.0–10.5)
nRBC: 0 % (ref 0.0–0.2)

## 2024-01-19 LAB — FERRITIN: Ferritin: 72 ng/mL (ref 24–336)

## 2024-01-19 NOTE — Progress Notes (Signed)
 Logan [name] presents today for phlebotomy per MD orders. Phlebotomy procedure started at 1550  to 1555 with 140g out and stopped flowing and  and 2nd stick started at 1600 ended at 1610 with 290g out a total of 430grams removed. Patient observed for 30 minutes after procedure without any incident. Patient tolerated procedure well. IV needle removed intact.

## 2024-01-22 NOTE — Progress Notes (Signed)
 Attempted to contact pt per [place] Police [facility] to let him know we are going to hold future phlebotomies since he is starting to become anemia. we will resume if ferritin levels >150  Left pt a message with the above information.

## 2024-02-02 ENCOUNTER — Inpatient Hospital Stay

## 2024-02-09 ENCOUNTER — Encounter: Payer: Self-pay | Admitting: Internal Medicine

## 2024-02-16 ENCOUNTER — Inpatient Hospital Stay

## 2024-02-16 ENCOUNTER — Inpatient Hospital Stay: Attending: Hematology and Oncology

## 2024-02-16 LAB — CBC WITH DIFFERENTIAL (CANCER CENTER ONLY)
Abs Immature Granulocytes: 0.02 K/uL (ref 0.00–0.07)
Basophils Absolute: 0.1 K/uL (ref 0.0–0.1)
Basophils Relative: 1 %
Eosinophils Absolute: 0.6 K/uL — ABNORMAL HIGH (ref 0.0–0.5)
Eosinophils Relative: 9 %
HCT: 39.2 % (ref 39.0–52.0)
Hemoglobin: 13.4 g/dL (ref 13.0–17.0)
Immature Granulocytes: 0 %
Lymphocytes Relative: 33 %
Lymphs Abs: 2.2 K/uL (ref 0.7–4.0)
MCH: 32.6 pg (ref 26.0–34.0)
MCHC: 34.2 g/dL (ref 30.0–36.0)
MCV: 95.4 fL (ref 80.0–100.0)
Monocytes Absolute: 0.7 K/uL (ref 0.1–1.0)
Monocytes Relative: 10 %
Neutro Abs: 3.1 K/uL (ref 1.7–7.7)
Neutrophils Relative %: 47 %
Platelet Count: 202 K/uL (ref 150–400)
RBC: 4.11 MIL/uL — ABNORMAL LOW (ref 4.22–5.81)
RDW: 14 % (ref 11.5–15.5)
WBC Count: 6.7 K/uL (ref 4.0–10.5)
nRBC: 0 % (ref 0.0–0.2)

## 2024-02-19 LAB — FERRITIN: Ferritin: 94 ng/mL (ref 24–336)

## 2024-03-01 ENCOUNTER — Inpatient Hospital Stay

## 2024-03-05 DIAGNOSIS — M5134 Other intervertebral disc degeneration, thoracic region: Secondary | ICD-10-CM | POA: Diagnosis not present

## 2024-03-05 DIAGNOSIS — M9903 Segmental and somatic dysfunction of lumbar region: Secondary | ICD-10-CM | POA: Diagnosis not present

## 2024-03-06 DIAGNOSIS — M9903 Segmental and somatic dysfunction of lumbar region: Secondary | ICD-10-CM | POA: Diagnosis not present

## 2024-03-06 DIAGNOSIS — M5134 Other intervertebral disc degeneration, thoracic region: Secondary | ICD-10-CM | POA: Diagnosis not present

## 2024-03-07 DIAGNOSIS — M5134 Other intervertebral disc degeneration, thoracic region: Secondary | ICD-10-CM | POA: Diagnosis not present

## 2024-03-07 DIAGNOSIS — M9903 Segmental and somatic dysfunction of lumbar region: Secondary | ICD-10-CM | POA: Diagnosis not present

## 2024-03-11 DIAGNOSIS — M9903 Segmental and somatic dysfunction of lumbar region: Secondary | ICD-10-CM | POA: Diagnosis not present

## 2024-03-11 DIAGNOSIS — M5134 Other intervertebral disc degeneration, thoracic region: Secondary | ICD-10-CM | POA: Diagnosis not present

## 2024-03-13 DIAGNOSIS — M5134 Other intervertebral disc degeneration, thoracic region: Secondary | ICD-10-CM | POA: Diagnosis not present

## 2024-03-13 DIAGNOSIS — M9903 Segmental and somatic dysfunction of lumbar region: Secondary | ICD-10-CM | POA: Diagnosis not present

## 2024-03-14 DIAGNOSIS — M5134 Other intervertebral disc degeneration, thoracic region: Secondary | ICD-10-CM | POA: Diagnosis not present

## 2024-03-14 DIAGNOSIS — M9903 Segmental and somatic dysfunction of lumbar region: Secondary | ICD-10-CM | POA: Diagnosis not present

## 2024-03-15 ENCOUNTER — Inpatient Hospital Stay

## 2024-03-18 DIAGNOSIS — M5134 Other intervertebral disc degeneration, thoracic region: Secondary | ICD-10-CM | POA: Diagnosis not present

## 2024-03-18 DIAGNOSIS — M9903 Segmental and somatic dysfunction of lumbar region: Secondary | ICD-10-CM | POA: Diagnosis not present

## 2024-03-26 DIAGNOSIS — M9903 Segmental and somatic dysfunction of lumbar region: Secondary | ICD-10-CM | POA: Diagnosis not present

## 2024-03-26 DIAGNOSIS — M5134 Other intervertebral disc degeneration, thoracic region: Secondary | ICD-10-CM | POA: Diagnosis not present

## 2024-03-27 ENCOUNTER — Other Ambulatory Visit: Payer: Self-pay | Admitting: Internal Medicine

## 2024-03-27 DIAGNOSIS — E039 Hypothyroidism, unspecified: Secondary | ICD-10-CM

## 2024-03-28 ENCOUNTER — Other Ambulatory Visit: Payer: Self-pay | Admitting: Internal Medicine

## 2024-03-28 ENCOUNTER — Other Ambulatory Visit: Payer: Self-pay | Admitting: Physician Assistant

## 2024-03-28 DIAGNOSIS — I10 Essential (primary) hypertension: Secondary | ICD-10-CM

## 2024-03-28 DIAGNOSIS — M5134 Other intervertebral disc degeneration, thoracic region: Secondary | ICD-10-CM | POA: Diagnosis not present

## 2024-03-28 DIAGNOSIS — Z23 Encounter for immunization: Secondary | ICD-10-CM | POA: Diagnosis not present

## 2024-03-28 DIAGNOSIS — M9903 Segmental and somatic dysfunction of lumbar region: Secondary | ICD-10-CM | POA: Diagnosis not present

## 2024-03-28 NOTE — Progress Notes (Unsigned)
 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 Genetic testing shows patient is homozygous for C282Y 01/13/2021: establish care with Dr. Federico  01/19/2021: Ferritin 129, Hgb 14.6 02/02/2021: Ferritin 78, Hgb 13.5 02/16/2021: Ferritin 53, Hgb 13.6 03/03/2022: last phlebotomy. Subsequent HELD as patient was at target.  06/22/2021: ferritin 36, Hgb 16.3 09/20/2021: ferritin 57, Hgb 15.5  Interval History:  Logan [name] 73 y.o. male with medical history significant for hereditary hemachromatosis presents for a follow up visit. The patient's last visit was on 12/26/2023. In the interim since the last visit he underwent therapeutic phlebotomies x 2.   On exam today Logan [name] reports he is feeling better due to the cold weather.  He continues to complete all his daily activities on his own.  He denies any appetite or weight changes.  He denies nausea, vomiting or bowel habit changes.  Denies easy bruising or signs of overt bleeding including hematochezia or melena.  He denies any fevers,chills, sweats, shortness of breath, chest pain or cough. He has no other complaints.    A full 10 point ROS is listed below.  MEDICAL HISTORY:  Past Medical History:  Diagnosis Date   Aortic atherosclerosis    Cataract    Coronary artery disease involving native coronary artery of native heart without angina pectoris 11/01/2007   Last Cath '04: LAD w/ 60% long lesion; 1st Dx 70% ostial; 2nd Dx  70% ostial; Cx 30% ostial; RCA-dominant multiple 30-40% lesions; PDA 30% lesions  Last nuclear stress March '09 - negative for ischemia    Diabetes  mellitus    Diabetic retinopathy    Hearing loss    Hemochromatosis    Hyperlipidemia    Hypertension    Tubular adenoma of colon     SURGICAL HISTORY: Past Surgical History:  Procedure Laterality Date   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   Procedure: Right Great Toe Amputation;  Surgeon: Jerona GAILS Sage, MD;  Location: [facility];  Service: Orthopedics;  Laterality: Right;  Right Great Toe Amputation   INNER EAR SURGERY     right    SOCIAL HISTORY: Social History   Socioeconomic History   Marital status: Widowed    Spouse name: Not on file   Number of children: 1   Years of education: 67   Highest education level: Not on file  Occupational History   Occupation: retired    Associate Professor: [employer]  Tobacco Use   Smoking status: Never   Smokeless tobacco: Never  Vaping Use   Vaping status: Never Used  Substance and Sexual Activity   Alcohol use: Yes    Comment: occ beer but very rare   Drug use: No   Sexual activity: Yes    Partners: Female  Other Topics Concern   Not on file  Social History Narrative   HSG. Married - '77 - '04. 1 son '80. Work - semi-retired. Has a girlfriend.   Lives alone/2025   Social Drivers of Health   Financial Resource Strain: Low Risk  (11/22/2023)   Overall Financial Resource Strain (CARDIA)    Difficulty of Paying Living Expenses: Not hard at all  Food Insecurity: No Food Insecurity (11/22/2023)   Hunger  Vital Sign    Worried About Programme researcher, broadcasting/film/video in the Last Year: Never true    Ran Out of Food in the Last Year: Never true  Transportation Needs: No Transportation Needs (11/22/2023)   PRAPARE - Administrator, Civil Service (Medical): No    Lack of Transportation (Non-Medical): No  Physical Activity: Inactive (11/22/2023)   Exercise Vital Sign    Days of Exercise per Week: 0 days    Minutes of Exercise per Session: 0 min  Stress: No Stress Concern Present (11/22/2023)   [employer] of Occupational Health - Occupational  Stress Questionnaire    Feeling of Stress: Not at all  Social Connections: Socially Isolated (11/22/2023)   Social Connection and Isolation Panel    Frequency of Communication with Friends and Family: Three times a week    Frequency of Social Gatherings with Friends and Family: Twice a week    Attends Religious Services: Never    Database administrator or Organizations: No    Attends Banker Meetings: Never    Marital Status: Widowed  Intimate Partner Violence: Patient Unable To Answer (11/22/2023)   Humiliation, Afraid, Rape, and Kick questionnaire    Fear of Current or Ex-Partner: Patient unable to answer    Emotionally Abused: Patient unable to answer    Physically Abused: Patient unable to answer    Sexually Abused: Patient unable to answer    FAMILY HISTORY: Family History  Problem Relation Age of Onset   Asthma Mother    Heart failure Mother    Emphysema Mother    Diabetes Father    Alcohol abuse Father    Cancer Father        prostate   Stroke Father    Diabetes Sister    Diabetes Brother    Colon cancer Maternal Uncle        dx'd 46   Blindness Paternal Grandmother    Diabetes Paternal Grandmother    Heart disease Neg Hx    Rectal cancer Neg Hx    Stomach cancer Neg Hx     ALLERGIES:  is allergic to bee venom and strawberry extract.  MEDICATIONS:  Current Outpatient Medications  Medication Sig Dispense Refill   amLODipine  (NORVASC ) 10 MG tablet TAKE 1 TABLET(10 MG) BY MOUTH DAILY 90 tablet 0   aspirin  81 MG tablet Take 81 mg by mouth 2 (two) times daily.     EPINEPHrine  0.3 MG/0.3ML SOSY Inject as directed as needed (Anaphylaxis).     famotidine  (PEPCID ) 20 MG tablet Take 20 mg by mouth as needed (Allergy  symptoms).     hydrALAZINE  (APRESOLINE ) 10 MG tablet Take 1 tablet (10 mg total) by mouth 3 (three) times daily. 180 tablet 3   insulin  degludec (TRESIBA  FLEXTOUCH) 100 UNIT/ML FlexTouch Pen Inject 19 Units into the skin at bedtime.     insulin   NPH-regular Human (NOVOLIN  70/30) (70-30) 100 UNIT/ML injection Inject 20 Units into the skin 2 (two) times daily with a meal. 48 mL 0   irbesartan  (AVAPRO ) 300 MG tablet TAKE 1 TABLET BY MOUTH EVERY DAY 90 tablet 0   levothyroxine  (SYNTHROID ) 88 MCG tablet TAKE 1 TABLET(88 MCG) BY MOUTH DAILY BEFORE BREAKFAST 90 tablet 1   rosuvastatin  (CRESTOR ) 10 MG tablet Take 1 tablet (10 mg total) by mouth daily. 90 tablet 3   spironolactone  (ALDACTONE ) 25 MG tablet Take 0.5 tablets (12.5 mg total) by mouth daily. 45 tablet 3   triamterene -hydrochlorothiazide  (DYAZIDE ) 37.5-25 MG  capsule Take 1 capsule by mouth daily.     No current facility-administered medications for this visit.    REVIEW OF SYSTEMS:   Constitutional: ( - ) fevers, ( - )  chills , ( - ) night sweats Eyes: ( - ) blurriness of vision, ( - ) double vision, ( - ) watery eyes Ears, nose, mouth, throat, and face: ( - ) mucositis, ( - ) sore throat Respiratory: ( - ) cough, ( - ) dyspnea, ( - ) wheezes Cardiovascular: ( - ) palpitation, ( - ) chest discomfort, ( - ) lower extremity swelling Gastrointestinal:  ( - ) nausea, ( - ) heartburn, ( - ) change in bowel habits Skin: ( - ) abnormal skin rashes Lymphatics: ( - ) new lymphadenopathy, ( - ) easy bruising Neurological: ( - ) numbness, ( - ) tingling, ( - ) new weaknesses Behavioral/Psych: ( - ) mood change, ( - ) new changes  All other systems were reviewed with the patient and are negative.  PHYSICAL EXAMINATION: ECOG PERFORMANCE STATUS: 0 - Asymptomatic  Vitals:   03/29/24 0838  BP: (!) 145/53  Pulse: 69  Resp: 18  Temp: 97.7 F (36.5 C)  SpO2: 97%     Filed Weights   03/29/24 0838  Weight: 175 lb 12.8 oz (79.7 kg)      GENERAL: Well-appearing elderly Caucasian male, alert, no distress and comfortable SKIN: skin color, texture, turgor are normal, no rashes or significant lesions EYES: conjunctiva are pink and non-injected, sclera clear LUNGS: clear to  auscultation and percussion with normal breathing effort HEART: regular rate & rhythm and no murmurs and no lower extremity edema. Musculoskeletal: no cyanosis of digits and no clubbing  PSYCH: alert & oriented x 3, fluent speech NEURO: no focal motor/sensory deficits  LABORATORY DATA:  I have reviewed the data as listed    Latest Ref Rng & Units 03/29/2024    8:02 AM 02/16/2024    3:14 PM 01/19/2024    3:10 PM  CBC  WBC 4.0 - 10.5 K/uL 7.6  6.7  6.9   Hemoglobin 13.0 - 17.0 g/dL 85.6  86.5  87.9   Hematocrit 39.0 - 52.0 % 41.1  39.2  34.2   Platelets 150 - 400 K/uL 191  202  217        Latest Ref Rng & Units 03/29/2024    8:02 AM 12/26/2023   10:37 AM 10/18/2023    9:07 AM  CMP  Glucose 70 - 99 mg/dL 845  96  58   BUN 8 - 23 mg/dL 32  25  21   Creatinine 0.61 - 1.24 mg/dL 8.53  8.53  8.66   Sodium 135 - 145 mmol/L 138  139  141   Potassium 3.5 - 5.1 mmol/L 5.0  4.6  4.3   Chloride 98 - 111 mmol/L 106  105  100   CO2 22 - 32 mmol/L 27  30  24    Calcium  8.9 - 10.3 mg/dL 9.2  9.4  9.7   Total Protein 6.5 - 8.1 g/dL 6.8  7.2    Total Bilirubin 0.0 - 1.2 mg/dL 0.8  1.1    Alkaline Phos 38 - 126 U/L 128  126    AST 15 - 41 U/L 34  32    ALT 0 - 44 U/L 33  34     RADIOGRAPHIC STUDIES: I have personally reviewed the radiological images as listed and agreed with the findings in the report.  No results found.   ASSESSMENT & PLAN Logan [name] is a 73 y.o.  male with medical history significant for hereditary hemachromatosis presents for a follow up visit.  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 Ferritin levels are pending but prior ferritin levels from 02/16/2024 was 94. -- Recommend to resume therapeutic phlebotomy every 2 weeks until patient reaches target ferritin of <50 --RTC in 3 months for labs   No orders of the defined types were placed in this encounter.   All questions were answered. The patient  knows to call the clinic with any problems, questions or concerns.  A total of more than 25 minutes were spent on this encounter with face-to-face time and non-face-to-face time, including preparing to see the patient, ordering tests and/or medications, counseling the patient and coordination of care as outlined above.   [place] Police PA-C Dept of Hematology and Oncology [facility] Phone: [phone]   03/29/2024 9:05 AM

## 2024-03-29 ENCOUNTER — Inpatient Hospital Stay

## 2024-03-29 ENCOUNTER — Inpatient Hospital Stay: Attending: Physician Assistant | Admitting: Physician Assistant

## 2024-03-29 DIAGNOSIS — Z800 Family history of malignant neoplasm of digestive organs: Secondary | ICD-10-CM | POA: Insufficient documentation

## 2024-03-29 DIAGNOSIS — Z8042 Family history of malignant neoplasm of prostate: Secondary | ICD-10-CM | POA: Diagnosis not present

## 2024-03-29 LAB — CBC WITH DIFFERENTIAL (CANCER CENTER ONLY)
Abs Immature Granulocytes: 0.01 K/uL (ref 0.00–0.07)
Basophils Absolute: 0.1 K/uL (ref 0.0–0.1)
Basophils Relative: 1 %
Eosinophils Absolute: 0.6 K/uL — ABNORMAL HIGH (ref 0.0–0.5)
Eosinophils Relative: 8 %
HCT: 41.1 % (ref 39.0–52.0)
Hemoglobin: 14.3 g/dL (ref 13.0–17.0)
Immature Granulocytes: 0 %
Lymphocytes Relative: 20 %
Lymphs Abs: 1.5 K/uL (ref 0.7–4.0)
MCH: 32.4 pg (ref 26.0–34.0)
MCHC: 34.8 g/dL (ref 30.0–36.0)
MCV: 93.2 fL (ref 80.0–100.0)
Monocytes Absolute: 0.9 K/uL (ref 0.1–1.0)
Monocytes Relative: 12 %
Neutro Abs: 4.5 K/uL (ref 1.7–7.7)
Neutrophils Relative %: 59 %
Platelet Count: 191 K/uL (ref 150–400)
RBC: 4.41 MIL/uL (ref 4.22–5.81)
RDW: 12.7 % (ref 11.5–15.5)
WBC Count: 7.6 K/uL (ref 4.0–10.5)
nRBC: 0 % (ref 0.0–0.2)

## 2024-03-29 LAB — CMP (CANCER CENTER ONLY)
ALT: 33 U/L (ref 0–44)
AST: 34 U/L (ref 15–41)
Albumin: 4.1 g/dL (ref 3.5–5.0)
Alkaline Phosphatase: 128 U/L — ABNORMAL HIGH (ref 38–126)
Anion gap: 5 (ref 5–15)
BUN: 32 mg/dL — ABNORMAL HIGH (ref 8–23)
CO2: 27 mmol/L (ref 22–32)
Calcium: 9.2 mg/dL (ref 8.9–10.3)
Chloride: 106 mmol/L (ref 98–111)
Creatinine: 1.46 mg/dL — ABNORMAL HIGH (ref 0.61–1.24)
GFR, Estimated: 50 mL/min — ABNORMAL LOW (ref >60)
Glucose, Bld: 154 mg/dL — ABNORMAL HIGH (ref 70–99)
Potassium: 5 mmol/L (ref 3.5–5.1)
Sodium: 138 mmol/L (ref 135–145)
Total Bilirubin: 0.8 mg/dL (ref 0.0–1.2)
Total Protein: 6.8 g/dL (ref 6.5–8.1)

## 2024-03-29 LAB — FERRITIN: Ferritin: 103 ng/mL (ref 24–336)

## 2024-03-29 NOTE — Progress Notes (Signed)
 Logan [name] presents today for phlebotomy per MD orders. Phlebotomy procedure started at 0905 and ended at 0914. 504 grams removed. 16G phlebotomy kit to R AC. Patient observed for 15 minutes after procedure without any incident. Patient tolerated procedure well. IV needle removed intact.

## 2024-03-29 NOTE — Patient Instructions (Signed)

## 2024-04-02 DIAGNOSIS — M5134 Other intervertebral disc degeneration, thoracic region: Secondary | ICD-10-CM | POA: Diagnosis not present

## 2024-04-02 DIAGNOSIS — M9903 Segmental and somatic dysfunction of lumbar region: Secondary | ICD-10-CM | POA: Diagnosis not present

## 2024-04-04 DIAGNOSIS — M5134 Other intervertebral disc degeneration, thoracic region: Secondary | ICD-10-CM | POA: Diagnosis not present

## 2024-04-04 DIAGNOSIS — M9903 Segmental and somatic dysfunction of lumbar region: Secondary | ICD-10-CM | POA: Diagnosis not present

## 2024-04-08 ENCOUNTER — Telehealth: Payer: Self-pay | Admitting: Hematology and Oncology

## 2024-04-08 ENCOUNTER — Encounter: Payer: Self-pay | Admitting: Radiology

## 2024-04-12 ENCOUNTER — Inpatient Hospital Stay

## 2024-04-26 ENCOUNTER — Inpatient Hospital Stay

## 2024-05-06 ENCOUNTER — Telehealth: Payer: Self-pay | Admitting: Internal Medicine

## 2024-05-06 NOTE — Telephone Encounter (Signed)
 Prescription Request  05/06/2024  LOV: 12/26/2023  What is the name of the medication or equipment? Levothyroxine  0.88 mcg  Have you contacted your pharmacy to request a refill? Yes   Which pharmacy would you like this sent to?  [id number] - [place], [place] - [address] AT [address] [place] [address] Phone: [phone] Fax: [phone]    Patient notified that their request is being sent to the clinical staff for review and that they should receive a response within 2 business days.   Please advise at Mobile [phone]

## 2024-05-06 NOTE — Telephone Encounter (Signed)
 Unable to reach patient. Left a very detailed message about his medication, his refill and the pharmacy.

## 2024-05-10 ENCOUNTER — Inpatient Hospital Stay

## 2024-05-23 ENCOUNTER — Ambulatory Visit (INDEPENDENT_AMBULATORY_CARE_PROVIDER_SITE_OTHER): Admitting: Internal Medicine

## 2024-05-23 ENCOUNTER — Encounter: Payer: Self-pay | Admitting: Internal Medicine

## 2024-05-23 VITALS — BP 138/86 | HR 58 | Temp 97.8°F | Resp 16 | Ht 74.0 in | Wt 174.4 lb

## 2024-05-23 DIAGNOSIS — N1831 Chronic kidney disease, stage 3a: Secondary | ICD-10-CM | POA: Diagnosis not present

## 2024-05-23 DIAGNOSIS — E039 Hypothyroidism, unspecified: Secondary | ICD-10-CM

## 2024-05-23 DIAGNOSIS — K635 Polyp of colon: Secondary | ICD-10-CM

## 2024-05-23 DIAGNOSIS — N400 Enlarged prostate without lower urinary tract symptoms: Secondary | ICD-10-CM

## 2024-05-23 DIAGNOSIS — I10 Essential (primary) hypertension: Secondary | ICD-10-CM | POA: Diagnosis not present

## 2024-05-23 DIAGNOSIS — I2510 Atherosclerotic heart disease of native coronary artery without angina pectoris: Secondary | ICD-10-CM | POA: Diagnosis not present

## 2024-05-23 DIAGNOSIS — I70209 Unspecified atherosclerosis of native arteries of extremities, unspecified extremity: Secondary | ICD-10-CM

## 2024-05-23 DIAGNOSIS — H6992 Unspecified Eustachian tube disorder, left ear: Secondary | ICD-10-CM

## 2024-05-23 DIAGNOSIS — Z794 Long term (current) use of insulin: Secondary | ICD-10-CM | POA: Diagnosis not present

## 2024-05-23 DIAGNOSIS — E1051 Type 1 diabetes mellitus with diabetic peripheral angiopathy without gangrene: Secondary | ICD-10-CM | POA: Diagnosis not present

## 2024-05-23 DIAGNOSIS — J302 Other seasonal allergic rhinitis: Secondary | ICD-10-CM

## 2024-05-23 DIAGNOSIS — Z23 Encounter for immunization: Secondary | ICD-10-CM | POA: Insufficient documentation

## 2024-05-23 DIAGNOSIS — Z1211 Encounter for screening for malignant neoplasm of colon: Secondary | ICD-10-CM | POA: Insufficient documentation

## 2024-05-23 LAB — URINALYSIS, ROUTINE W REFLEX MICROSCOPIC
Bilirubin Urine: NEGATIVE
Hgb urine dipstick: NEGATIVE
Ketones, ur: NEGATIVE
Leukocytes,Ua: NEGATIVE
Nitrite: NEGATIVE
RBC / HPF: NONE SEEN (ref 0–?)
Specific Gravity, Urine: 1.01 (ref 1.000–1.030)
Total Protein, Urine: NEGATIVE
Urine Glucose: NEGATIVE
Urobilinogen, UA: 1 (ref 0.0–1.0)
WBC, UA: NONE SEEN (ref 0–?)
pH: 7 (ref 5.0–8.0)

## 2024-05-23 LAB — MICROALBUMIN / CREATININE URINE RATIO
Creatinine,U: 88.1 mg/dL
Microalb Creat Ratio: 14.1 mg/g (ref 0.0–30.0)
Microalb, Ur: 1.2 mg/dL (ref 0.7–1.9)

## 2024-05-23 LAB — TSH: TSH: 6.6 u[IU]/mL — ABNORMAL HIGH (ref 0.35–5.50)

## 2024-05-23 LAB — PSA: PSA: 2.82 ng/mL (ref 0.10–4.00)

## 2024-05-23 LAB — HEMOGLOBIN A1C: Hgb A1c MFr Bld: 7.9 % — ABNORMAL HIGH (ref 4.6–6.5)

## 2024-05-23 MED ORDER — METHYLPREDNISOLONE 4 MG PO TBPK: ORAL_TABLET | ORAL | 0 refills | Status: AC

## 2024-05-23 MED ORDER — ASPIRIN 81 MG PO TBEC: 81.0000 mg | DELAYED_RELEASE_TABLET | Freq: Every day | ORAL | 1 refills | Status: AC

## 2024-05-23 MED ORDER — SHINGRIX 50 MCG/0.5ML IM SUSR: 0.5000 mL | Freq: Once | INTRAMUSCULAR | 1 refills | Status: AC

## 2024-05-23 NOTE — Patient Instructions (Signed)
Type 1 Diabetes Mellitus, Diagnosis, Adult  Type 1 diabetes mellitus, or type 1 diabetes, is a long-term (chronic) disease. It happens when the cells in the pancreas that make a hormone called insulin are destroyed. Normally, insulin lets blood sugar (glucose) enter cells in your body. This gives you energy. If you have type 1 diabetes, glucose cannot get into cells. It builds up in the blood instead. This causes high blood glucose (hyperglycemia). There is no cure for type 1 diabetes. Treatment can help you manage your condition. What are the causes? The exact cause of type 1 diabetes is not known. What increases the risk? You may be more likely to have type 1 diabetes if a family member has it as well. You may also be more at risk if: You have a gene for type 1 diabetes that was passed down to you from a parent (inherited). You have an autoimmune disorder. This means that your body's disease-fighting system (immune system) attacks your body. You have been exposed to certain viruses. You live in an area with cold weather. What are the signs or symptoms? Symptoms may begin slowly over days or weeks. They may also start all of a sudden. Symptoms may include: Increased thirst or hunger. Needing to pee (urinate) more often or peeing more at night. Sudden weight changes that you cannot explain. Tiredness (fatigue) or weakness. Vision changes. These may include blurry vision. How is this diagnosed? Type 1 diabetes is diagnosed based on your symptoms, your medical history, a physical exam, and your blood glucose level. Your blood glucose may be checked with: A fasting blood glucose (FBG) test. You will not be allowed to eat (you will fast) for 8 hours or more before a blood sample is taken. A random blood glucose test. This test checks your blood glucose at any time of day no matter when you last ate. A hemoglobin A1C (A1C) blood test. This shows what your blood glucose levels have been over the  last 2-3 months. You may be diagnosed with type 1 diabetes if: Your FBG level is 126 mg/dL (7 mmol/L) or higher. Your random blood glucose level is 200 mg/dL (16.1 mmol/L) or higher. Your A1C level is 6.5% or higher. These blood tests may be done more than once. You may also need other blood tests. How is this treated? You may work with an expert called an endocrinologist to find ways to manage your diabetes. You should also follow instructions from your health care provider. You may need to: Take insulin every day. This helps to keep your blood glucose levels in the right range. Take medicines to help prevent problems from diabetes. You may need to take: Aspirin. Medicine to lower your cholesterol. Medicine to control your blood pressure. Check your blood glucose as often as told. Make diet and lifestyle changes. You may be told to: Follow a nutrition plan that has been made just for you by a dietitian. Get regular exercise. Find ways to manage stress. Your provider will set treatment goals that are right for you. These goals will be based on your age, other conditions you have, and how you respond to treatment. In general, your A1C level should be less than 7% and your blood glucose levels should be: 80-130 mg/dL (0.9-6.0 mmol/L) before meals (preprandial). Below 180 mg/dL (10 mmol/L) after meals (postprandial). Follow these instructions at home: Questions to ask your health care provider You may want to ask your provider: Do I need to meet with a certified  diabetes care and education specialist? Should I join a support group for people with diabetes? What equipment will I need to manage my diabetes at home? What diabetes medicines should I take, and when? How often should I check my blood glucose? What number should I call if I have questions? When is my next appointment? General instructions Take over-the-counter and prescription medicines only as told by your provider. Keep all  follow-up visits. You will need regular blood tests to make sure the treatments are working. Your provider may adjust your medicines based on your test results. Where to find more information American Diabetes Association (ADA): diabetes.org Association of Diabetes Care and Education Specialists (ADCES): diabeteseducator.org International Diabetes Federation (IDF): http://hill.biz/ Contact a health care provider if: Your blood glucose level is higher than 240 mg/dL (16.1 mmol/L) for 2 days in a row. You have been sick or have had a fever for 2 days or more, and you are not getting better. For more than 6 hours, you: Cannot eat or drink. Have nausea and vomiting. Have diarrhea. Get help right away if: Your blood glucose is less than 54 mg/dL (3 mmol/L). You become confused, or you have trouble thinking clearly. You have trouble breathing. These symptoms may be an emergency. Get help right away. Call 911. Do not wait to see if the symptoms will go away. Do not drive yourself to the hospital. This information is not intended to replace advice given to you by your health care provider. Make sure you discuss any questions you have with your health care provider. Document Revised: 01/24/2022 Document Reviewed: 01/24/2022 Elsevier Patient Education  2024 [employer].

## 2024-05-23 NOTE — Progress Notes (Signed)
 Subjective:  Patient ID: Logan [name], male    DOB: 1950-06-28  Age: 73 y.o. MRN: [id number]  CC: Hypertension, Diabetes, Hyperlipidemia, and Hypothyroidism   HPI Logan [name] presents for f/up ---  Discussed the use of AI scribe software for clinical note transcription with the patient, who gave verbal consent to proceed.  History of Present Illness Logan [name] is a 73 year old male who presents with left ear symptoms including itching and popping.  He has experienced symptoms in his left ear for about three days, including itching and occasional popping when swallowing. He also noted itching in the right ear the other night. No significant allergy  symptoms are reported, although he does experience sneezing.  He has experienced episodes of low blood pressure but denies any associated weakness, dizziness, or lightheadedness. He remains active, including climbing inclines without chest pain or shortness of breath.  He recently received a tetanus shot, a flu vaccine, and a COVID vaccine.  He remains active, including deer hunting and climbing trees, without experiencing shortness of breath.    Outpatient Medications Prior to Visit  Medication Sig Dispense Refill   amLODipine  (NORVASC ) 10 MG tablet TAKE 1 TABLET(10 MG) BY MOUTH DAILY 90 tablet 0   EPINEPHrine  0.3 MG/0.3ML SOSY Inject as directed as needed (Anaphylaxis).     hydrALAZINE  (APRESOLINE ) 10 MG tablet Take 1 tablet (10 mg total) by mouth 3 (three) times daily. 180 tablet 3   insulin  degludec (TRESIBA  FLEXTOUCH) 100 UNIT/ML FlexTouch Pen Inject 19 Units into the skin at bedtime.     insulin  NPH-regular Human (NOVOLIN  70/30) (70-30) 100 UNIT/ML injection Inject 20 Units into the skin 2 (two) times daily with a meal. 48 mL 0   irbesartan  (AVAPRO ) 300 MG tablet TAKE 1 TABLET BY MOUTH EVERY DAY 90 tablet 0   levothyroxine  (SYNTHROID ) 88 MCG tablet TAKE 1 TABLET(88 MCG) BY MOUTH DAILY BEFORE BREAKFAST 90 tablet 1    rosuvastatin  (CRESTOR ) 10 MG tablet Take 1 tablet (10 mg total) by mouth daily. 90 tablet 3   spironolactone  (ALDACTONE ) 25 MG tablet Take 0.5 tablets (12.5 mg total) by mouth daily. 45 tablet 3   triamterene -hydrochlorothiazide  (DYAZIDE ) 37.5-25 MG capsule Take 1 capsule by mouth daily.     aspirin  81 MG tablet Take 81 mg by mouth 2 (two) times daily.     famotidine  (PEPCID ) 20 MG tablet Take 20 mg by mouth as needed (Allergy  symptoms).     No facility-administered medications prior to visit.    ROS Review of Systems  Constitutional:  Negative for appetite change, chills, diaphoresis, fatigue and fever.  HENT:  Positive for congestion, ear pain and sneezing. Negative for facial swelling, hearing loss, postnasal drip, rhinorrhea, sinus pressure, sore throat, tinnitus, trouble swallowing and voice change.   Eyes: Negative.  Negative for visual disturbance.  Respiratory: Negative.  Negative for cough, chest tightness, shortness of breath and wheezing.   Cardiovascular:  Negative for chest pain, palpitations and leg swelling.  Gastrointestinal:  Negative for abdominal pain, constipation, diarrhea, nausea and vomiting.  Endocrine: Negative.  Negative for polyuria.  Genitourinary: Negative.  Negative for difficulty urinating.  Musculoskeletal: Negative.  Negative for arthralgias.  Skin: Negative.   Neurological: Negative.  Negative for dizziness, weakness and light-headedness.  Hematological:  Negative for adenopathy. Does not bruise/bleed easily.  Psychiatric/Behavioral: Negative.      Objective:  BP 138/86 (BP Location: Left Arm, Patient Position: Sitting, Cuff Size: Normal)   Pulse (!) 58   Temp  97.8 F (36.6 C) (Oral)   Resp 16   Ht 6' 2 (1.88 m)   Wt 174 lb 6.4 oz (79.1 kg)   SpO2 98%   BMI 22.39 kg/m   BP Readings from Last 3 Encounters:  05/23/24 138/86  03/29/24 126/64  03/29/24 (!) 145/53    Wt Readings from Last 3 Encounters:  05/23/24 174 lb 6.4 oz (79.1 kg)   03/29/24 175 lb 12.8 oz (79.7 kg)  12/26/23 174 lb 6.4 oz (79.1 kg)    Physical Exam Vitals reviewed.  Constitutional:      Appearance: Normal appearance.  HENT:     Right Ear: Hearing, tympanic membrane, ear canal and external ear normal. No swelling. No middle ear effusion. There is no impacted cerumen. Tympanic membrane is not injected.     Left Ear: Hearing, tympanic membrane, ear canal and external ear normal. No swelling.  No middle ear effusion. There is no impacted cerumen. Tympanic membrane is not injected.     Nose: Mucosal edema and congestion present. No rhinorrhea.     Right Nostril: No epistaxis.     Left Nostril: No epistaxis.     Right Sinus: No maxillary sinus tenderness or frontal sinus tenderness.     Left Sinus: No maxillary sinus tenderness or frontal sinus tenderness.     Mouth/Throat:     Mouth: Mucous membranes are moist.  Eyes:     General: No scleral icterus.    Conjunctiva/sclera: Conjunctivae normal.  Cardiovascular:     Rate and Rhythm: Bradycardia present.     Heart sounds: Murmur heard.     Systolic murmur is present with a grade of 1/6.     No friction rub. No gallop.     Comments: EKG -- SB with SA (new), 58 bpm LAD Low voltage Septal and inferior infarct patterns are not new Unchanged  Pulmonary:     Effort: No respiratory distress.     Breath sounds: No stridor. No wheezing, rhonchi or rales.  Chest:     Chest wall: No tenderness.  Abdominal:     General: Abdomen is flat.     Palpations: There is no mass.     Tenderness: There is no abdominal tenderness. There is no guarding.     Hernia: No hernia is present.  Musculoskeletal:        General: Normal range of motion.     Cervical back: Neck supple.     Right lower leg: No edema.     Left lower leg: No edema.  Lymphadenopathy:     Cervical: No cervical adenopathy.  Skin:    General: Skin is warm and dry.     Coloration: Skin is not jaundiced.  Neurological:     General: No focal  deficit present.     Mental Status: He is alert.     Lab Results  Component Value Date   WBC 7.6 03/29/2024   HGB 14.3 03/29/2024   HCT 41.1 03/29/2024   PLT 191 03/29/2024   GLUCOSE 154 (H) 03/29/2024   CHOL 98 09/18/2023   TRIG 22 09/18/2023   HDL 54 09/18/2023   LDLCALC 40 09/18/2023   ALT 33 03/29/2024   AST 34 03/29/2024   NA 138 03/29/2024   K 5.0 03/29/2024   CL 106 03/29/2024   CREATININE 1.46 (H) 03/29/2024   BUN 32 (H) 03/29/2024   CO2 27 03/29/2024   TSH 6.60 (H) 05/23/2024   PSA 2.82 05/23/2024   HGBA1C 7.9 (H)  05/23/2024   MICROALBUR 1.2 05/23/2024    No results found.  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 -     Urinalysis, Routine w reflex microscopic; Future -     Hemoglobin A1c; Future -     Microalbumin / creatinine urine ratio; Future -     Ambulatory referral to Ophthalmology -     AMB Referral VBCI Care Management  Acquired hypothyroidism- He is euthyroid. -     TSH; Future  Essential hypertension- BP is well controlled. -     Urinalysis, Routine w reflex microscopic; Future -     TSH; Future -     EKG 12-Lead -     AMB Referral VBCI Care Management  Benign prostatic hyperplasia without lower urinary tract symptoms -     PSA; Future -     Urinalysis, Routine w reflex microscopic; Future  Seasonal allergic rhinitis due to fungal spores -     methylPREDNISolone ; TAKE AS DIRECTED  Dispense: 21 tablet; Refill: 0  Eustachian tube disorder, left -     methylPREDNISolone ; TAKE AS DIRECTED  Dispense: 21 tablet; Refill: 0  Polyp of colon, unspecified part of colon, unspecified type -     Ambulatory referral to Gastroenterology  Stage 3a chronic kidney disease (HCC)- Will avoid nephrotoxic agents   Need for prophylactic vaccination and inoculation against varicella -     Shingrix ; Inject 0.5 mLs into the muscle once for 1 dose.   Dispense: 0.5 mL; Refill: 1  Coronary artery disease involving native coronary artery of native heart without angina pectoris -     Aspirin ; Take 1 tablet (81 mg total) by mouth daily. Swallow whole.  Dispense: 90 tablet; Refill: 1     Follow-up: Return in about 6 months (around 11/21/2024).  Debby Molt, MD

## 2024-05-24 ENCOUNTER — Ambulatory Visit: Payer: Self-pay | Admitting: Internal Medicine

## 2024-05-24 ENCOUNTER — Inpatient Hospital Stay

## 2024-06-03 ENCOUNTER — Telehealth: Payer: Self-pay | Admitting: *Deleted

## 2024-06-03 NOTE — Progress Notes (Unsigned)
 Care Guide Pharmacy Note  06/03/2024 Name: Logan [name] MRN: [id number] DOB: 12-20-1950  Referred By: Joshua Debby LITTIE, MD Reason for referral: Call Attempt #1 and Complex Care Management (Outreach to schedule referral with pharmacist )   Logan [name] is a 73 y.o. year old male who is a primary care patient of Joshua Debby LITTIE, MD.  Logan [name] was referred to the pharmacist for assistance related to: DMII  An unsuccessful telephone outreach was attempted today to contact the patient who was referred to the pharmacy team for assistance with medication management. Additional attempts will be made to contact the patient.  Logan [name], CMA [place]  [facility], [facility] Guide Direct Dial: [phone]  Fax: [phone] Website: [email or web address]

## 2024-06-04 NOTE — Progress Notes (Signed)
 Care Guide Pharmacy Note  06/04/2024 Name: [name] MRN: [id number] DOB: [birth year]  Referred By: Joshua Debby LITTIE, MD Reason for referral: Call Attempt #1 and Complex Care Management (Outreach to schedule referral with pharmacist )   Logan [name] is a 73 y.o. year old male who is a primary care patient of Joshua Debby LITTIE, MD.  Aleene LITTIE Staple was referred to the pharmacist for assistance related to: DMII  Successful contact was made with the patient to discuss pharmacy services including being ready for the pharmacist to call at least 5 minutes before the scheduled appointment time and to have medication bottles and any blood pressure readings ready for review. The patient agreed to meet with the pharmacist via telephone visit on 06/13/2024  Thedford Franks, CMA [place]  [facility], [facility] Guide Direct Dial: [phone]  Fax: [phone] Website: [email or web address]

## 2024-06-11 NOTE — Progress Notes (Unsigned)
 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 Genetic testing shows patient is homozygous for C282Y 01/13/2021: establish care with Dr. Federico  01/19/2021: Ferritin 129, Hgb 14.6 02/02/2021: Ferritin 78, Hgb 13.5 02/16/2021: Ferritin 53, Hgb 13.6 03/03/2022: last phlebotomy. Subsequent HELD as patient was at target.  06/22/2021: ferritin 36, Hgb 16.3 09/20/2021: ferritin 57, Hgb 15.5  Interval History:  Logan [name] 74 y.o. male with medical history significant for hereditary hemachromatosis presents for a follow up visit. The patient's last visit was on 03/29/2024. In the interim since the last visit he has not required a phlebotomy.   On exam today Logan [name] reports he has been well overall in interim since her last visit with good energy and good strength.  He has his usual pressured speech and wandering focus.  He is redirectable.  He reports that he has had steady weight and that he usually gains weight in the winter.  He reports that he has been deer hunting and also recent began beekeeping.  He was stung recently and as a result now has an EpiPen  on hand.  He reports that he otherwise has had no major changes in his health such as fevers, chills, sweats, nausea vomiting or diarrhea.  A full 10 point ROS otherwise negative.  MEDICAL HISTORY:  Past Medical History:  Diagnosis Date   Aortic atherosclerosis    Cataract    Coronary artery disease involving native coronary artery of native heart without angina pectoris 11/01/2007   Last Cath '04: LAD w/ 60% long lesion; 1st Dx 70% ostial; 2nd Dx  70% ostial; Cx 30% ostial;  RCA-dominant multiple 30-40% lesions; PDA 30% lesions  Last nuclear stress March '09 - negative for ischemia    Diabetes mellitus    Diabetic retinopathy    Hearing loss    Hemochromatosis    Hyperlipidemia    Hypertension    Tubular adenoma of colon     SURGICAL HISTORY: Past Surgical History:  Procedure Laterality Date   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   Procedure: Right Great Toe Amputation;  Surgeon: Jerona GAILS Sage, MD;  Location: [facility];  Service: Orthopedics;  Laterality: Right;  Right Great Toe Amputation   INNER EAR SURGERY     right    SOCIAL HISTORY: Social History   Socioeconomic History   Marital status: Widowed    Spouse name: Not on file   Number of children: 1   Years of education: 18   Highest education level: Not on file  Occupational History   Occupation: retired    Associate Professor: [employer]  Tobacco Use   Smoking status: Never   Smokeless tobacco: Never  Vaping Use   Vaping status: Never Used  Substance and Sexual Activity   Alcohol use: Yes    Comment: occ beer but very rare   Drug use: No   Sexual activity: Yes    Partners: Female  Other Topics Concern   Not on file  Social History Narrative   HSG. Married - '77 - '04. 1 son '80. Work - semi-retired. Has a girlfriend.   Lives alone/2025   Social Drivers of Health   Tobacco Use: Low Risk (05/23/2024)  Patient History    Smoking Tobacco Use: Never    Smokeless Tobacco Use: Never    Passive Exposure: Not on file  Financial Resource Strain: Low Risk (11/22/2023)   Overall Financial Resource Strain (CARDIA)    Difficulty of Paying Living Expenses: Not hard at all  Food Insecurity: No Food Insecurity (11/22/2023)   Epic    Worried About Programme Researcher, Broadcasting/film/video in the Last Year: Never true    Ran Out of Food in the Last Year: Never true  Transportation Needs: No Transportation Needs (11/22/2023)   Epic    Lack of Transportation (Medical): No    Lack of Transportation (Non-Medical): No  Physical  Activity: Inactive (11/22/2023)   Exercise Vital Sign    Days of Exercise per Week: 0 days    Minutes of Exercise per Session: 0 min  Stress: No Stress Concern Present (11/22/2023)   [employer] of Occupational Health - Occupational Stress Questionnaire    Feeling of Stress: Not at all  Social Connections: Socially Isolated (11/22/2023)   Social Connection and Isolation Panel    Frequency of Communication with Friends and Family: Three times a week    Frequency of Social Gatherings with Friends and Family: Twice a week    Attends Religious Services: Never    Database Administrator or Organizations: No    Attends Banker Meetings: Never    Marital Status: Widowed  Intimate Partner Violence: Patient Unable To Answer (11/22/2023)   Epic    Fear of Current or Ex-Partner: Patient unable to answer    Emotionally Abused: Patient unable to answer    Physically Abused: Patient unable to answer    Sexually Abused: Patient unable to answer  Depression (PHQ2-9): Low Risk (05/23/2024)   Depression (PHQ2-9)    PHQ-2 Score: 0  Alcohol Screen: Low Risk (11/22/2023)   Alcohol Screen    Last Alcohol Screening Score (AUDIT): 0  Housing: Unknown (11/22/2023)   Epic    Unable to Pay for Housing in the Last Year: No    Number of Times Moved in the Last Year: Not on file    Homeless in the Last Year: No  Utilities: Not At Risk (11/22/2023)   Epic    Threatened with loss of utilities: No  Health Literacy: Adequate Health Literacy (11/22/2023)   B1300 Health Literacy    Frequency of need for help with medical instructions: Never    FAMILY HISTORY: Family History  Problem Relation Age of Onset   Asthma Mother    Heart failure Mother    Emphysema Mother    Diabetes Father    Alcohol abuse Father    Cancer Father        prostate   Stroke Father    Diabetes Sister    Diabetes Brother    Colon cancer Maternal Uncle        dx'd 29   Blindness Paternal Grandmother    Diabetes  Paternal Grandmother    Heart disease Neg Hx    Rectal cancer Neg Hx    Stomach cancer Neg Hx     ALLERGIES:  is allergic to bee venom and strawberry extract.  MEDICATIONS:  Current Outpatient Medications  Medication Sig Dispense Refill   amLODipine  (NORVASC ) 10 MG tablet TAKE 1 TABLET(10 MG) BY MOUTH DAILY 90 tablet 0   aspirin  EC 81 MG tablet Take 1 tablet (81 mg total) by mouth daily. Swallow whole. 90 tablet 1   EPINEPHrine  0.3 MG/0.3ML SOSY  Inject as directed as needed (Anaphylaxis).     hydrALAZINE  (APRESOLINE ) 10 MG tablet Take 1 tablet (10 mg total) by mouth 3 (three) times daily. 180 tablet 3   insulin  degludec (TRESIBA  FLEXTOUCH) 100 UNIT/ML FlexTouch Pen Inject 19 Units into the skin at bedtime.     insulin  NPH-regular Human (NOVOLIN  70/30) (70-30) 100 UNIT/ML injection Inject 20 Units into the skin 2 (two) times daily with a meal. 48 mL 0   irbesartan  (AVAPRO ) 300 MG tablet TAKE 1 TABLET BY MOUTH EVERY DAY 90 tablet 0   levothyroxine  (SYNTHROID ) 88 MCG tablet TAKE 1 TABLET(88 MCG) BY MOUTH DAILY BEFORE BREAKFAST 90 tablet 1   rosuvastatin  (CRESTOR ) 10 MG tablet Take 1 tablet (10 mg total) by mouth daily. 90 tablet 3   spironolactone  (ALDACTONE ) 25 MG tablet Take 0.5 tablets (12.5 mg total) by mouth daily. 45 tablet 3   triamterene -hydrochlorothiazide  (DYAZIDE ) 37.5-25 MG capsule Take 1 capsule by mouth daily.     No current facility-administered medications for this visit.    REVIEW OF SYSTEMS:   Constitutional: ( - ) fevers, ( - )  chills , ( - ) night sweats Eyes: ( - ) blurriness of vision, ( - ) double vision, ( - ) watery eyes Ears, nose, mouth, throat, and face: ( - ) mucositis, ( - ) sore throat Respiratory: ( - ) cough, ( - ) dyspnea, ( - ) wheezes Cardiovascular: ( - ) palpitation, ( - ) chest discomfort, ( - ) lower extremity swelling Gastrointestinal:  ( - ) nausea, ( - ) heartburn, ( - ) change in bowel habits Skin: ( - ) abnormal skin rashes Lymphatics: ( -  ) new lymphadenopathy, ( - ) easy bruising Neurological: ( - ) numbness, ( - ) tingling, ( - ) new weaknesses Behavioral/Psych: ( - ) mood change, ( - ) new changes  All other systems were reviewed with the patient and are negative.  PHYSICAL EXAMINATION: ECOG PERFORMANCE STATUS: 0 - Asymptomatic  Vitals:   06/12/24 1042  BP: (!) 132/54  Pulse: 81  Resp: 16  Temp: 97.9 F (36.6 C)  SpO2: 98%      Filed Weights   06/12/24 1042  Weight: 177 lb 12.8 oz (80.6 kg)       GENERAL: Well-appearing elderly Caucasian male, alert, no distress and comfortable SKIN: skin color, texture, turgor are normal, no rashes or significant lesions EYES: conjunctiva are pink and non-injected, sclera clear LUNGS: clear to auscultation and percussion with normal breathing effort HEART: regular rate & rhythm and no murmurs and no lower extremity edema. Musculoskeletal: no cyanosis of digits and no clubbing  PSYCH: alert & oriented x 3, fluent speech NEURO: no focal motor/sensory deficits  LABORATORY DATA:  I have reviewed the data as listed    Latest Ref Rng & Units 06/12/2024   10:32 AM 03/29/2024    8:02 AM 02/16/2024    3:14 PM  CBC  WBC 4.0 - 10.5 K/uL 7.7  7.6  6.7   Hemoglobin 13.0 - 17.0 g/dL 84.9  85.6  86.5   Hematocrit 39.0 - 52.0 % 42.9  41.1  39.2   Platelets 150 - 400 K/uL 196  191  202        Latest Ref Rng & Units 03/29/2024    8:02 AM 12/26/2023   10:37 AM 10/18/2023    9:07 AM  CMP  Glucose 70 - 99 mg/dL 845  96  58   BUN 8 - 23  mg/dL 32  25  21   Creatinine 0.61 - 1.24 mg/dL 8.53  8.53  8.66   Sodium 135 - 145 mmol/L 138  139  141   Potassium 3.5 - 5.1 mmol/L 5.0  4.6  4.3   Chloride 98 - 111 mmol/L 106  105  100   CO2 22 - 32 mmol/L 27  30  24    Calcium  8.9 - 10.3 mg/dL 9.2  9.4  9.7   Total Protein 6.5 - 8.1 g/dL 6.8  7.2    Total Bilirubin 0.0 - 1.2 mg/dL 0.8  1.1    Alkaline Phos 38 - 126 U/L 128  126    AST 15 - 41 U/L 34  32    ALT 0 - 44 U/L 33  34      RADIOGRAPHIC STUDIES: I have personally reviewed the radiological images as listed and agreed with the findings in the report. No results found.   ASSESSMENT & PLAN Logan [name] is a 74 y.o.  male with medical history significant for hereditary hemachromatosis presents for a follow up visit.  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 Ferritin levels are pending but prior ferritin levels from 03/29/2024 was 103. -- If ferritin were to increase above 150, would recommend to resume therapeutic phlebotomy every 2 weeks until patient reaches target ferritin of <50 --RTC in 3 months for labs and 6 months for clinic visit   No orders of the defined types were placed in this encounter.   All questions were answered. The patient knows to call the clinic with any problems, questions or concerns.  A total of more than 25 minutes were spent on this encounter with face-to-face time and non-face-to-face time, including preparing to see the patient, ordering tests and/or medications, counseling the patient and coordination of care as outlined above.   Norleen IVAR Kidney, MD Department of Hematology/Oncology [facility] Phone: [phone] Pager: [phone] Email: [email or web address]  06/12/2024 11:54 AM "

## 2024-06-12 ENCOUNTER — Inpatient Hospital Stay

## 2024-06-12 ENCOUNTER — Inpatient Hospital Stay: Attending: Hematology and Oncology | Admitting: Hematology and Oncology

## 2024-06-12 LAB — CBC WITH DIFFERENTIAL (CANCER CENTER ONLY)
Abs Immature Granulocytes: 0.01 K/uL (ref 0.00–0.07)
Basophils Absolute: 0.1 K/uL (ref 0.0–0.1)
Basophils Relative: 1 %
Eosinophils Absolute: 0.5 K/uL (ref 0.0–0.5)
Eosinophils Relative: 6 %
HCT: 42.9 % (ref 39.0–52.0)
Hemoglobin: 15 g/dL (ref 13.0–17.0)
Immature Granulocytes: 0 %
Lymphocytes Relative: 32 %
Lymphs Abs: 2.5 K/uL (ref 0.7–4.0)
MCH: 32.5 pg (ref 26.0–34.0)
MCHC: 35 g/dL (ref 30.0–36.0)
MCV: 93.1 fL (ref 80.0–100.0)
Monocytes Absolute: 0.8 K/uL (ref 0.1–1.0)
Monocytes Relative: 10 %
Neutro Abs: 3.9 K/uL (ref 1.7–7.7)
Neutrophils Relative %: 51 %
Platelet Count: 196 K/uL (ref 150–400)
RBC: 4.61 MIL/uL (ref 4.22–5.81)
RDW: 13.2 % (ref 11.5–15.5)
WBC Count: 7.7 K/uL (ref 4.0–10.5)
nRBC: 0 % (ref 0.0–0.2)

## 2024-06-12 LAB — FERRITIN: Ferritin: 83 ng/mL (ref 24–336)

## 2024-06-12 NOTE — Progress Notes (Signed)
 "  06/13/2024 Name: Logan [name] MRN: [id number] DOB: August 26, 1950  Chief Complaint  Patient presents with   Hypertension   Diabetes    Logan [name] is a 74 y.o. year old male who presented for a telephone visit.   They were referred to the pharmacist by their PCP for assistance in managing diabetes.    Subjective:  Care Team: Primary Care Provider: Joshua Debby LITTIE, MD ; Next Scheduled Visit: 11/22/24   Medication Access/Adherence  Current Pharmacy:  [id number] [address], [place] - [address] AT [address] [place] [address] Phone: [phone] Fax: [phone]   Patient reports affordability concerns with their medications: No  Patient reports access/transportation concerns to their pharmacy: No  Patient reports adherence concerns with their medications:  No     Diabetes:  Managed by endocrinologist, Dr. Tommas - next scheduled appointment next month  Current medications:  Tresiba  -  Written as 19 units SQ at bedtime Patient takes up to 12 units depending on bedtime glucose readings, for example if ~110, patient will not take Tresiba . Per patient this plan was discussed with endocrinology.  Novolin  70/30 -  Written as 20 units two times daily with meal Patient reports taking 18 units twice daily  Medications tried in the past:  Lantus , Toujeo   Current glucose readings: During the call: 134 after meal. Patient reports some lows (54 upon waking) - wears Libre 2   Macrovascular and Microvascular Risk Reduction:  Statin? yes (rosuvastatin  10mg  tablet once daily); ACEi/ARB? yes (irbesartan  300mg  tablet once daily ) Last urinary albumin/creatinine ratio:  Lab Results  Component Value Date   MICRALBCREAT 14.1 05/23/2024   MICRALBCREAT 297.4 (H) 08/24/2023   Last eye exam:  Lab Results  Component Value Date   HMDIABEYEEXA Retinopathy (A) 03/24/2023   Last foot exam: 12/26/2023 Tobacco Use:  Tobacco Use: Low Risk  (05/23/2024)   Patient History    Smoking Tobacco Use: Never    Smokeless Tobacco Use: Never    Passive Exposure: Not on file   Hypertension: -Patient reports low blood pressure readings but denies symptoms. Patient states blood pressure is sensitive to medication changes. Most recent change includes changing hydralazine  from taking three times daily to once daily, patient reports this was discussed with Dr. Joshua.  Current medications:  Amlodipine  10mg  tablet - Take 1 tablet by mouth daily Hydralazine  10mg  tablet -  Written as: Take 1 tablet by mouth three times daily Patient reports only taking once daily Irbesartan  300mg  tablet - Take 1 tablet by mouth every day Spironolactone  25mg  tablet - Take 0.5 tablets by mouth daily Triamterene -hydrochlorothiazide  37.5-25 - Take 1 capsule by mouth daily   Medications previously tried: aliskiren  valsartan , azilsartan medoxomil , clonidine , edarbi , losartan , metoprolol , nebivolol   Current blood pressure readings readings: 107/54 this morning   Current physical activity: Patient reports being outdoors hiking/deer hunting   Objective:  Lab Results  Component Value Date   HGBA1C 7.9 (H) 05/23/2024    Lab Results  Component Value Date   CREATININE 1.46 (H) 03/29/2024   BUN 32 (H) 03/29/2024   NA 138 03/29/2024   K 5.0 03/29/2024   CL 106 03/29/2024   CO2 27 03/29/2024    Lab Results  Component Value Date   CHOL 98 09/18/2023   HDL 54 09/18/2023   LDLCALC 40 09/18/2023   TRIG 22 09/18/2023   CHOLHDL 1.8 09/18/2023    Medications Reviewed Today     Reviewed by Maybelle, Vanna L,  RPH (Pharmacist) on 06/13/24 at 1022  Med List Status: <None>   Medication Order Taking? Sig Documenting Provider Last Dose Status Informant  amLODipine  (NORVASC ) 10 MG tablet [id number] Yes TAKE 1 TABLET(10 MG) BY MOUTH DAILY Joshua Debby CROME, MD  Active   aspirin  EC 81 MG tablet [id number] Yes Take 1 tablet (81 mg total) by mouth daily. Swallow whole.  Joshua Debby CROME, MD  Active   EPINEPHrine  0.3 MG/0.3ML SHERMAN [id number] Yes Inject as directed as needed (Anaphylaxis). [provider]  Active   hydrALAZINE  (APRESOLINE ) 10 MG tablet [id number] Yes Take 1 tablet (10 mg total) by mouth 3 (three) times daily.  Patient taking differently: Take 10 mg by mouth daily.   Barbarann Nest, MD  Active   insulin  degludec (TRESIBA  FLEXTOUCH) 100 UNIT/ML FlexTouch Pen [id number] Yes Inject 19 Units into the skin at bedtime. [provider]  Active   insulin  NPH-regular Human (NOVOLIN  70/30) (70-30) 100 UNIT/ML injection [id number] Yes Inject 20 Units into the skin 2 (two) times daily with a meal.  Patient taking differently: Inject 18 Units into the skin 2 (two) times daily with a meal.   Joshua Debby CROME, MD  Active Self, Child, Pharmacy Records           Med Note ([place] , MELVENA HERO   Tue Apr 25, 2023  9:30 AM)    irbesartan  (AVAPRO ) 300 MG tablet [id number] Yes TAKE 1 TABLET BY MOUTH EVERY DAY Joshua Debby CROME, MD  Active   levothyroxine  (SYNTHROID ) 88 MCG tablet [id number] Yes TAKE 1 TABLET(88 MCG) BY MOUTH DAILY BEFORE OFILIA Joshua Debby CROME, MD  Active   rosuvastatin  (CRESTOR ) 10 MG tablet [id number] Yes Take 1 tablet (10 mg total) by mouth daily. Logan Vina GAILS, MD  Active Self, Child, Pharmacy Records  spironolactone  (ALDACTONE ) 25 MG tablet [id number] Yes Take 0.5 tablets (12.5 mg total) by mouth daily. Joshua Debby CROME, MD  Active   triamterene -hydrochlorothiazide  (DYAZIDE ) 37.5-25 MG capsule [id number] Yes Take 1 capsule by mouth daily. [provider]  Active               Assessment/Plan:   Diabetes: - Currently controlled; goal A1c <8%. Cardiorenal risk reduction is optimized. Blood pressure is at goal <130/80. LDL is at goal.  - Patient states he will be seeing his endocrinologist in a few weeks. - Continue current regimen.   Hypertension: - Currently controlled (Goal <130/80) - Recommend no changes to  current regimen. - Continue to monitor blood pressure   Follow Up Plan: no follow up necessary  Vanna Labi, PharmD, RPh PGY1 Acute Care Pharmacy Resident [facility]  Darrelyn Drum, PharmD, BCPS, CPP Clinical Pharmacist Practitioner [facility] [phone]  "

## 2024-06-13 ENCOUNTER — Other Ambulatory Visit: Admitting: Pharmacist

## 2024-06-13 ENCOUNTER — Telehealth: Payer: Self-pay | Admitting: Physician Assistant

## 2024-06-13 VITALS — BP 107/54

## 2024-06-13 DIAGNOSIS — E1051 Type 1 diabetes mellitus with diabetic peripheral angiopathy without gangrene: Secondary | ICD-10-CM

## 2024-06-13 DIAGNOSIS — I10 Essential (primary) hypertension: Secondary | ICD-10-CM

## 2024-06-13 NOTE — Patient Instructions (Addendum)
 It was a pleasure speaking with you today!  There were no necessary changes to your regimen today. Please continue to monitor your blood pressure and watch for symptoms of hypotension (dizziness, lightheadedness, weakness). Continue to monitor blood glucose and watch for symptoms of hypoglycemia (shakiness, sweating, palpitations).   Feel free to call with any questions or concerns!  Dionicia Canavan, PharmD, RPh PGY1 Acute Care Pharmacy Resident [facility]  Darrelyn Drum, PharmD, BCPS, CPP Clinical Pharmacist Practitioner [facility] [phone]

## 2024-06-13 NOTE — Telephone Encounter (Signed)
 I spoke to pt regarding getting next future appts being scheduled. Okay to schedule but said he hasn't used my chart & will stop by scheduling within week to confirm those appts.

## 2024-06-19 ENCOUNTER — Telehealth: Payer: Self-pay | Admitting: *Deleted

## 2024-06-19 ENCOUNTER — Ambulatory Visit: Payer: Self-pay | Admitting: *Deleted

## 2024-06-19 NOTE — Telephone Encounter (Signed)
 error

## 2024-06-19 NOTE — Telephone Encounter (Addendum)
 Contacted patient per MD request with message below. No answer - LVM for Mr. Chill to contact Dr. Lafonda office for test results.    ----- Message ----- From: Federico Norleen ONEIDA MADISON, MD Sent: 06/16/2024   8:53 AM EST To: Almarie DELENA Arabia, RN  Please let Mr. Eppinger know that his ferritin is on target at 83 (goal is <150). We'll see him back in April for labs and July 2026 for clinic visit.

## 2024-06-20 ENCOUNTER — Other Ambulatory Visit: Payer: Self-pay | Admitting: Internal Medicine

## 2024-08-29 ENCOUNTER — Encounter (INDEPENDENT_AMBULATORY_CARE_PROVIDER_SITE_OTHER): Admitting: Ophthalmology

## 2024-09-12 ENCOUNTER — Inpatient Hospital Stay

## 2024-11-22 ENCOUNTER — Ambulatory Visit

## 2024-12-11 ENCOUNTER — Inpatient Hospital Stay

## 2024-12-11 ENCOUNTER — Inpatient Hospital Stay: Admitting: Physician Assistant

## 2025-03-13 ENCOUNTER — Inpatient Hospital Stay

## 2025-06-13 ENCOUNTER — Inpatient Hospital Stay

## 2025-06-13 ENCOUNTER — Inpatient Hospital Stay: Admitting: Hematology and Oncology
